# Patient Record
Sex: Female | Born: 1935 | Race: White | Hispanic: No | Smoking: Never smoker
Health system: Southern US, Community
[De-identification: ages and names within clinical notes are randomized; demographics above are authoritative.]

## PROBLEM LIST (undated history)

## (undated) DIAGNOSIS — I10 Essential (primary) hypertension: Secondary | ICD-10-CM

## (undated) DIAGNOSIS — M1990 Unspecified osteoarthritis, unspecified site: Secondary | ICD-10-CM

## (undated) DIAGNOSIS — M069 Rheumatoid arthritis, unspecified: Secondary | ICD-10-CM

## (undated) DIAGNOSIS — M797 Fibromyalgia: Secondary | ICD-10-CM

## (undated) DIAGNOSIS — G4486 Cervicogenic headache: Secondary | ICD-10-CM

## (undated) DIAGNOSIS — R51 Headache: Secondary | ICD-10-CM

## (undated) DIAGNOSIS — R269 Unspecified abnormalities of gait and mobility: Secondary | ICD-10-CM

## (undated) DIAGNOSIS — M47816 Spondylosis without myelopathy or radiculopathy, lumbar region: Secondary | ICD-10-CM

## (undated) DIAGNOSIS — K219 Gastro-esophageal reflux disease without esophagitis: Secondary | ICD-10-CM

## (undated) DIAGNOSIS — IMO0002 Reserved for concepts with insufficient information to code with codable children: Secondary | ICD-10-CM

## (undated) DIAGNOSIS — F32A Depression, unspecified: Secondary | ICD-10-CM

## (undated) DIAGNOSIS — G250 Essential tremor: Secondary | ICD-10-CM

## (undated) DIAGNOSIS — G43909 Migraine, unspecified, not intractable, without status migrainosus: Secondary | ICD-10-CM

## (undated) DIAGNOSIS — H9190 Unspecified hearing loss, unspecified ear: Secondary | ICD-10-CM

## (undated) DIAGNOSIS — N189 Chronic kidney disease, unspecified: Secondary | ICD-10-CM

## (undated) DIAGNOSIS — M8580 Other specified disorders of bone density and structure, unspecified site: Secondary | ICD-10-CM

## (undated) DIAGNOSIS — F329 Major depressive disorder, single episode, unspecified: Secondary | ICD-10-CM

## (undated) DIAGNOSIS — M47812 Spondylosis without myelopathy or radiculopathy, cervical region: Secondary | ICD-10-CM

## (undated) DIAGNOSIS — R011 Cardiac murmur, unspecified: Secondary | ICD-10-CM

## (undated) DIAGNOSIS — E669 Obesity, unspecified: Secondary | ICD-10-CM

## (undated) DIAGNOSIS — I509 Heart failure, unspecified: Secondary | ICD-10-CM

## (undated) HISTORY — DX: Migraine, unspecified, not intractable, without status migrainosus: G43.909

## (undated) HISTORY — PX: TONSILLECTOMY: SUR1361

## (undated) HISTORY — DX: Fibromyalgia: M79.7

## (undated) HISTORY — PX: CHOLECYSTECTOMY: SHX55

## (undated) HISTORY — DX: Obesity, unspecified: E66.9

## (undated) HISTORY — DX: Essential tremor: G25.0

## (undated) HISTORY — DX: Cervicogenic headache: G44.86

## (undated) HISTORY — PX: COLONOSCOPY: SHX174

## (undated) HISTORY — PX: APPENDECTOMY: SHX54

## (undated) HISTORY — PX: HAMMER TOE SURGERY: SHX385

## (undated) HISTORY — DX: Rheumatoid arthritis, unspecified: M06.9

## (undated) HISTORY — DX: Chronic kidney disease, unspecified: N18.9

## (undated) HISTORY — DX: Spondylosis without myelopathy or radiculopathy, lumbar region: M47.816

## (undated) HISTORY — DX: Unspecified abnormalities of gait and mobility: R26.9

## (undated) HISTORY — PX: ABDOMINAL HYSTERECTOMY: SHX81

## (undated) HISTORY — DX: Headache: R51

## (undated) HISTORY — DX: Gastro-esophageal reflux disease without esophagitis: K21.9

## (undated) HISTORY — DX: Spondylosis without myelopathy or radiculopathy, cervical region: M47.812

## (undated) HISTORY — DX: Other specified disorders of bone density and structure, unspecified site: M85.80

## (undated) HISTORY — PX: ELBOW SURGERY: SHX618

## (undated) HISTORY — PX: TOTAL KNEE ARTHROPLASTY: SHX125

---

## 1998-06-21 ENCOUNTER — Ambulatory Visit (HOSPITAL_COMMUNITY): Admission: RE | Admit: 1998-06-21 | Discharge: 1998-06-21 | Payer: Self-pay | Admitting: Internal Medicine

## 1998-10-10 ENCOUNTER — Ambulatory Visit (HOSPITAL_COMMUNITY): Admission: RE | Admit: 1998-10-10 | Discharge: 1998-10-10 | Payer: Self-pay | Admitting: Orthopedic Surgery

## 1998-10-10 ENCOUNTER — Encounter: Payer: Self-pay | Admitting: Orthopedic Surgery

## 1998-10-25 ENCOUNTER — Ambulatory Visit (HOSPITAL_COMMUNITY): Admission: RE | Admit: 1998-10-25 | Discharge: 1998-10-25 | Payer: Self-pay | Admitting: Orthopedic Surgery

## 1998-10-25 ENCOUNTER — Encounter: Payer: Self-pay | Admitting: Orthopedic Surgery

## 1998-11-08 ENCOUNTER — Encounter: Payer: Self-pay | Admitting: Orthopedic Surgery

## 1998-11-08 ENCOUNTER — Ambulatory Visit (HOSPITAL_COMMUNITY): Admission: RE | Admit: 1998-11-08 | Discharge: 1998-11-08 | Payer: Self-pay | Admitting: Orthopedic Surgery

## 2000-03-22 ENCOUNTER — Encounter: Payer: Self-pay | Admitting: Neurosurgery

## 2000-03-22 ENCOUNTER — Ambulatory Visit (HOSPITAL_COMMUNITY): Admission: RE | Admit: 2000-03-22 | Discharge: 2000-03-22 | Payer: Self-pay | Admitting: Neurosurgery

## 2000-04-05 ENCOUNTER — Ambulatory Visit (HOSPITAL_COMMUNITY): Admission: RE | Admit: 2000-04-05 | Discharge: 2000-04-05 | Payer: Self-pay | Admitting: Neurosurgery

## 2000-04-05 ENCOUNTER — Encounter: Payer: Self-pay | Admitting: Neurosurgery

## 2000-04-19 ENCOUNTER — Encounter: Payer: Self-pay | Admitting: Neurosurgery

## 2000-04-19 ENCOUNTER — Ambulatory Visit (HOSPITAL_COMMUNITY): Admission: RE | Admit: 2000-04-19 | Discharge: 2000-04-19 | Payer: Self-pay | Admitting: Neurosurgery

## 2000-06-06 ENCOUNTER — Ambulatory Visit (HOSPITAL_BASED_OUTPATIENT_CLINIC_OR_DEPARTMENT_OTHER): Admission: RE | Admit: 2000-06-06 | Discharge: 2000-06-06 | Payer: Self-pay | Admitting: Orthopedic Surgery

## 2000-08-07 ENCOUNTER — Ambulatory Visit (HOSPITAL_COMMUNITY): Admission: RE | Admit: 2000-08-07 | Discharge: 2000-08-07 | Payer: Self-pay | Admitting: Internal Medicine

## 2000-11-04 ENCOUNTER — Encounter: Payer: Self-pay | Admitting: Orthopedic Surgery

## 2000-11-04 ENCOUNTER — Encounter: Admission: RE | Admit: 2000-11-04 | Discharge: 2000-11-04 | Payer: Self-pay | Admitting: Orthopedic Surgery

## 2000-11-12 ENCOUNTER — Encounter: Admission: RE | Admit: 2000-11-12 | Discharge: 2001-02-10 | Payer: Self-pay | Admitting: Orthopedic Surgery

## 2000-11-29 ENCOUNTER — Encounter: Admission: RE | Admit: 2000-11-29 | Discharge: 2000-12-27 | Payer: Self-pay | Admitting: Orthopedic Surgery

## 2001-02-21 ENCOUNTER — Encounter: Admission: RE | Admit: 2001-02-21 | Discharge: 2001-04-16 | Payer: Self-pay | Admitting: Family Medicine

## 2001-02-21 ENCOUNTER — Ambulatory Visit (HOSPITAL_COMMUNITY): Admission: RE | Admit: 2001-02-21 | Discharge: 2001-02-21 | Payer: Self-pay | Admitting: Family Medicine

## 2001-02-21 ENCOUNTER — Encounter: Payer: Self-pay | Admitting: Family Medicine

## 2001-04-18 ENCOUNTER — Encounter: Admission: RE | Admit: 2001-04-18 | Discharge: 2001-04-18 | Payer: Self-pay | Admitting: Family Medicine

## 2001-04-18 ENCOUNTER — Encounter: Payer: Self-pay | Admitting: Family Medicine

## 2001-04-30 ENCOUNTER — Encounter: Admission: RE | Admit: 2001-04-30 | Discharge: 2001-04-30 | Payer: Self-pay | Admitting: Family Medicine

## 2001-04-30 ENCOUNTER — Encounter: Payer: Self-pay | Admitting: Family Medicine

## 2001-05-15 ENCOUNTER — Encounter: Admission: RE | Admit: 2001-05-15 | Discharge: 2001-05-15 | Payer: Self-pay | Admitting: Family Medicine

## 2001-05-15 ENCOUNTER — Encounter: Payer: Self-pay | Admitting: Family Medicine

## 2002-03-16 ENCOUNTER — Encounter: Admission: RE | Admit: 2002-03-16 | Discharge: 2002-05-06 | Payer: Self-pay | Admitting: Family Medicine

## 2002-08-03 ENCOUNTER — Encounter: Admission: RE | Admit: 2002-08-03 | Discharge: 2002-09-29 | Payer: Self-pay | Admitting: Internal Medicine

## 2003-03-26 ENCOUNTER — Encounter: Payer: Self-pay | Admitting: Internal Medicine

## 2003-03-26 ENCOUNTER — Encounter: Admission: RE | Admit: 2003-03-26 | Discharge: 2003-03-26 | Payer: Self-pay | Admitting: Internal Medicine

## 2004-01-10 ENCOUNTER — Encounter: Admission: RE | Admit: 2004-01-10 | Discharge: 2004-03-28 | Payer: Self-pay | Admitting: Family Medicine

## 2004-08-28 ENCOUNTER — Encounter: Admission: RE | Admit: 2004-08-28 | Discharge: 2004-08-28 | Payer: Self-pay | Admitting: Family Medicine

## 2005-08-22 ENCOUNTER — Encounter: Admission: RE | Admit: 2005-08-22 | Discharge: 2005-09-25 | Payer: Self-pay | Admitting: Family Medicine

## 2005-09-26 ENCOUNTER — Encounter: Admission: RE | Admit: 2005-09-26 | Discharge: 2005-10-23 | Payer: Self-pay | Admitting: Family Medicine

## 2005-10-24 ENCOUNTER — Encounter: Admission: RE | Admit: 2005-10-24 | Discharge: 2005-11-09 | Payer: Self-pay | Admitting: Family Medicine

## 2005-12-11 ENCOUNTER — Encounter: Admission: RE | Admit: 2005-12-11 | Discharge: 2006-03-11 | Payer: Self-pay | Admitting: Family Medicine

## 2005-12-29 ENCOUNTER — Emergency Department (HOSPITAL_COMMUNITY): Admission: EM | Admit: 2005-12-29 | Discharge: 2005-12-29 | Payer: Self-pay | Admitting: Emergency Medicine

## 2006-03-13 ENCOUNTER — Encounter: Admission: RE | Admit: 2006-03-13 | Discharge: 2006-03-29 | Payer: Self-pay | Admitting: Family Medicine

## 2006-05-06 ENCOUNTER — Encounter: Admission: RE | Admit: 2006-05-06 | Discharge: 2006-05-06 | Payer: Self-pay | Admitting: Family Medicine

## 2006-07-25 ENCOUNTER — Encounter: Admission: RE | Admit: 2006-07-25 | Discharge: 2006-07-25 | Payer: Self-pay | Admitting: Family Medicine

## 2006-07-29 ENCOUNTER — Encounter: Admission: RE | Admit: 2006-07-29 | Discharge: 2006-07-29 | Payer: Self-pay | Admitting: Family Medicine

## 2007-01-15 ENCOUNTER — Encounter: Admission: RE | Admit: 2007-01-15 | Discharge: 2007-02-05 | Payer: Self-pay | Admitting: Family Medicine

## 2007-01-28 ENCOUNTER — Encounter: Admission: RE | Admit: 2007-01-28 | Discharge: 2007-01-28 | Payer: Self-pay | Admitting: Family Medicine

## 2007-02-19 ENCOUNTER — Encounter: Admission: RE | Admit: 2007-02-19 | Discharge: 2007-05-20 | Payer: Self-pay | Admitting: Family Medicine

## 2007-02-25 ENCOUNTER — Encounter: Admission: RE | Admit: 2007-02-25 | Discharge: 2007-02-25 | Payer: Self-pay | Admitting: Cardiology

## 2007-02-27 ENCOUNTER — Inpatient Hospital Stay (HOSPITAL_BASED_OUTPATIENT_CLINIC_OR_DEPARTMENT_OTHER): Admission: RE | Admit: 2007-02-27 | Discharge: 2007-02-27 | Payer: Self-pay | Admitting: Cardiology

## 2007-03-17 ENCOUNTER — Inpatient Hospital Stay (HOSPITAL_COMMUNITY): Admission: RE | Admit: 2007-03-17 | Discharge: 2007-03-21 | Payer: Self-pay | Admitting: Orthopedic Surgery

## 2007-03-21 ENCOUNTER — Ambulatory Visit: Payer: Self-pay | Admitting: Vascular Surgery

## 2007-04-21 ENCOUNTER — Encounter: Admission: RE | Admit: 2007-04-21 | Discharge: 2007-07-20 | Payer: Self-pay | Admitting: Orthopedic Surgery

## 2007-07-29 ENCOUNTER — Encounter: Admission: RE | Admit: 2007-07-29 | Discharge: 2007-08-02 | Payer: Self-pay | Admitting: Orthopedic Surgery

## 2008-09-06 ENCOUNTER — Encounter: Admission: RE | Admit: 2008-09-06 | Discharge: 2008-10-25 | Payer: Self-pay | Admitting: Podiatrist

## 2008-10-05 ENCOUNTER — Encounter: Admission: RE | Admit: 2008-10-05 | Discharge: 2008-10-05 | Payer: Self-pay | Admitting: Internal Medicine

## 2008-11-08 ENCOUNTER — Encounter: Admission: RE | Admit: 2008-11-08 | Discharge: 2009-01-07 | Payer: Self-pay | Admitting: Podiatrist

## 2009-03-01 ENCOUNTER — Encounter: Admission: RE | Admit: 2009-03-01 | Discharge: 2009-04-06 | Payer: Self-pay | Admitting: Family Medicine

## 2009-10-04 ENCOUNTER — Encounter: Admission: RE | Admit: 2009-10-04 | Discharge: 2009-10-04 | Payer: Self-pay | Admitting: Family Medicine

## 2009-10-11 ENCOUNTER — Encounter: Admission: RE | Admit: 2009-10-11 | Discharge: 2009-10-11 | Payer: Self-pay | Admitting: Family Medicine

## 2010-05-30 ENCOUNTER — Encounter
Admission: RE | Admit: 2010-05-30 | Discharge: 2010-07-13 | Payer: Self-pay | Source: Home / Self Care | Admitting: Family Medicine

## 2010-11-18 ENCOUNTER — Emergency Department (HOSPITAL_COMMUNITY)
Admission: EM | Admit: 2010-11-18 | Discharge: 2010-11-18 | Disposition: A | Discharge status: Other Acute Inpatient Hospital | Payer: Self-pay | Source: Home / Self Care | Admitting: Family Medicine

## 2010-11-18 ENCOUNTER — Emergency Department (HOSPITAL_COMMUNITY)
Admission: EM | Admit: 2010-11-18 | Discharge: 2010-11-19 | Payer: Self-pay | Source: Home / Self Care | Admitting: Emergency Medicine

## 2010-11-21 LAB — APTT: aPTT: 35 seconds (ref 24–37)

## 2010-11-21 LAB — CBC
Hemoglobin: 11.1 g/dL — ABNORMAL LOW (ref 12.0–15.0)
MCH: 27.1 pg (ref 26.0–34.0)
MCV: 88 fL (ref 78.0–100.0)
Platelets: 306 10*3/uL (ref 150–400)
RDW: 14.1 % (ref 11.5–15.5)
WBC: 9 10*3/uL (ref 4.0–10.5)

## 2010-11-21 LAB — URINALYSIS, ROUTINE W REFLEX MICROSCOPIC
Bilirubin Urine: NEGATIVE
Ketones, ur: NEGATIVE mg/dL
Nitrite: NEGATIVE
Protein, ur: NEGATIVE mg/dL
Specific Gravity, Urine: 1.009 (ref 1.005–1.030)
Urobilinogen, UA: 0.2 mg/dL (ref 0.0–1.0)
pH: 7 (ref 5.0–8.0)

## 2010-11-21 LAB — PROTIME-INR
INR: 0.93 (ref 0.00–1.49)
Prothrombin Time: 12.7 seconds (ref 11.6–15.2)

## 2010-11-21 LAB — COMPREHENSIVE METABOLIC PANEL
BUN: 22 mg/dL (ref 6–23)
Chloride: 103 mEq/L (ref 96–112)
Creatinine, Ser: 1.04 mg/dL (ref 0.4–1.2)
GFR calc Af Amer: >60 mL/min (ref >60)
Potassium: 4.4 mEq/L (ref 3.5–5.1)
Sodium: 140 mEq/L (ref 135–145)
Total Bilirubin: 0.8 mg/dL (ref 0.3–1.2)
Total Protein: 7.1 g/dL (ref 6.0–8.3)

## 2010-11-21 LAB — DIFFERENTIAL
Monocytes Absolute: 0.8 10*3/uL (ref 0.1–1.0)
Monocytes Relative: 8 % (ref 3–12)
Neutrophils Relative %: 63 % (ref 43–77)

## 2010-11-21 LAB — URINE CULTURE: Culture  Setup Time: 201201221147

## 2010-11-29 ENCOUNTER — Encounter: Payer: Self-pay | Admitting: Orthopaedic Surgery

## 2011-03-16 NOTE — Cardiovascular Report (Signed)
NAME:  Dominique Mckee, Dominique Mckee                ACCOUNT NO.:  [id number]   MEDICAL RECORD NO.:  [id number]          PATIENT TYPE:  OIB   LOCATION:  1961                         FACILITY:  [facility]   PHYSICIAN:  Georga Hacking, M.D.DATE OF BIRTH:  Nov 06, 1929   DATE OF PROCEDURE:  02/27/2007  DATE OF DISCHARGE:                            CARDIAC CATHETERIZATION   HISTORY:  A 77 year old female with hypertension who has had some  dyspnea and has had a preoperative cardiac evaluation with a Cardiolite  test.  She had significant ST depression with adenosine and a reversible  distal anterior defect.  Catheterization was advised to assess her  cardiac risk.   PROCEDURE:  Left heart catheterization with coronary angiograms and left  ventriculogram.   COMMENTS ABOUT THE PROCEDURE:  The procedure was done in the outpatient  diagnostic lab through the right femoral artery using a single anterior  wall stick using a 4-French sheath and catheters.  At the end of the  procedure, good hemostasis and peripheral pulses were noted.   HEMODYNAMIC DATA:  Aorta post contrast 145/60, LV postcontrast 145/6-10.   ANGIOGRAPHIC DATA:  Left ventriculogram:  Performed in the 30 degrees  RAO projection.  The aortic valve is normal.  There is mitral annular  calcification noted.  No mitral regurgitation noted.  The left ventricle  is normal in size with normal wall motion.  Estimated ejection fraction  was 70%.  Coronary arteries arise and distribute normally.  There was no  significant coronary calcification present.  The left main coronary  artery was normal.  The left anterior descending is tortuous but  contains no significant disease.  A moderate-sized diagonal branch  arises.  The circumflex coronary artery is tortuous and contains a  single branching marginal artery.  The right coronary artery is a  dominant vessel with a shepherd's crook configuration.  The distal  vessel is somewhat small.  Posterior descending  is good size.   IMPRESSION:  1. Normal left ventricular function.  2. No significant obstructive coronary artery disease identified.   RECOMMENDATIONS:  Proceed with surgery.      Georga Hacking, M.D.     WST/MEDQ  D:  02/27/2007  T:  02/27/2007  Job:  [id number]   cc:   Lillia Carmel, M.D.  Loreta Ave, M.D.

## 2011-03-16 NOTE — Discharge Summary (Signed)
NAME:  Dominique Mckee, Dominique Mckee                ACCOUNT NO.:  [id number]   MEDICAL RECORD NO.:  [id number]          PATIENT TYPE:  INP   LOCATION:  5005                         FACILITY:  [facility]   PHYSICIAN:  Loreta Ave, M.D. DATE OF BIRTH:  [birth year]   DATE OF ADMISSION:  03/17/2007  DATE OF DISCHARGE:  03/21/2007                               DISCHARGE SUMMARY   FINAL DIAGNOSES:  1. Status post right total knee replacement for endstage degenerative      joint disease.  2. Anemia secondary to postoperative blood loss.  3. Hypertension.  4. Coronary artery disease.  5. Gastroesophageal reflux disease.  6. Cervical degenerative disk disease.  7. Osteoarthrosis.   HISTORY OF PRESENT ILLNESS:  A 77 year old white female presented to our  office with complaints of endstage DJD right knee and chronic pain.  She  had progressively worsening pain with failed response with conservative  treatment.  Significant decrease in her daily activities due to the  ongoing complaint.   HOSPITAL COURSE:  On Mar 17, 2007 the patient was taken to the [facility] operating room and a right total knee replacement procedure  performed.  Surgeon Mckinley Jewel, M.D. and assistant Genene Churn. Barry Dienes, PA-  C.  Anesthesia general with femoral nerve block.  No specimens.  EBL  minimal.  Tourniquet time 1 hour and 20 minutes.  One Hemovac drain  placed.  The patient tolerated the procedure well and was transferred to  recovery in stable condition.  On Mar 18, 2007 patient doing well.  Good  pain control.  Vital signs stable.  Afebrile.  Hemoglobin 8.9,  hematocrit 27.5, sodium 138, potassium 4.1, chloride 103, CO2 29, BUN  11, creatinine 0.95, glucose 122, INR 1.2.  Dressing clean, dry and  intact.  Calf nontender.  Neurovascularly intact.  Started FeSO4 325 mg  p.o. b.i.d. with meals.  Started PT.  Coumadin pharmacy protocol  started.  PT recommended skilled nursing facility placement.  On Mar 19, 2007 patient with  good pain control.  States she was feeling a little  fatigued and weak.  No complaints of chest pain or shortness of breath.  Temperature 99.3, pulse 83, respirations 20, blood pressure 125/65.  WBC  8.3, hemoglobin 8.1, hematocrit 24.5, platelets 206.  Sodium 140,  potassium 4.3, chloride 105, CO2 31, BUN 7, creatinine 0.75, glucose  131, INR 1.4.  Preoperative hemoglobin was 12.1.  Wound looked good.  Staples intact.  Discontinued Hemovac drain.  No signs of infection.  Calf nontender.  Neurovascularly intact.  Transfused two units packed  red blood cells and then discontinued Foley.  Discontinued PCA.  On Mar 20, 2007 patient feeling much better after transfusion packed red blood  cells.  Good pain control.  Vital signs stable.  Afebrile.  Hemoglobin  9.9, hemoglobin 29.7.  Electrolytes stable.  INR 1.8.  Wound looked  good.  Staples intact.  No drainage of signs of infection.  Calf  nontender.  Neurovascularly intact.  On Mar 21, 2007 patient doing well,  but complaining of right greater than left calf pain.  Denies chest pain  or shortness of breath.  States she is ready to transfer to a skilled  facility and bed available at [facility].  Temperature 97.8,  pulse 71, respirations 18, blood pressure 130/66.  WBC 7.3, hematocrit  30.7, hemoglobin 10.0, platelets 248.  Sodium 142, potassium 3.9,  chloride 103, CO2 29, BUN 17, creatinine 1.02, glucose 102.  INR 2.0.  Wound looked good.  Staples intact.  No drainage of signs of infection.  She does have right greater than left proximal to mid calf tenderness.  Negative Homans bilaterally.  Neurovascularly intact.  Will obtain  venous Doppler bilateral lower extremities this a.m. to rule out DVT.  She is currently on Coumadin for DVT prophylaxis and INR is now  therapeutic.  Anticipate transfer to skilled facility today.  We will be  called with results of the Doppler test.   CONDITION ON DISCHARGE:  Stable.   DISPOSITION:   Transfer to [facility] skilled facility.   DISCHARGE MEDICATIONS:  1. Colace 100 mg p.o. b.i.d.  2. Coumadin pharmacy protocol maintain INR 2-3.  3. Lisinopril 20 mg p.o. daily.  4. Protonix 40 mg p.o. daily.  5. Elavil 25 mg p.o. q.h.s. p.r.n.  6. Calcium carbonate and vitamin D 2 tablets p.o. daily.  7. FeSO4 325 mg 1 tablet p.o. b.i.d. with food.  8. Percocet 5/325 1-2 tablets p.o. q.4-6h. p.r.n. for pain.  9. Tylenol 325 to 650 mg p.o. q.4-6h. p.r.n. for temperature greater      than 101.  10.Robaxin 500 mg 1 tablet p.o. q.6h. p.r.n. for spasm.   DISPOSITION:  The patient will work with PT and OT to improve knee range  of motion, strengthening and better ambulation.  Daily dressing changes  with 4x4, gauze and tape.  The patient is to sleep with her knee  immobilizer on.  Also to use immobilizer when she is up and ambulating.  Coumadin x4 weeks postop for DVT prophylaxis.  She is okay to shower,  but no tub soaking.  No driving.  Follow up in the office two weeks from  her date of surgery for recheck.  Call for appointment at [phone].  If  there are any questions or concerns, please notify us.      Genene Churn. Denton Meek.      Loreta Ave, M.D.  Electronically Signed    JMO/MEDQ  D:  03/21/2007  T:  03/21/2007  Job:  [id number]

## 2011-03-16 NOTE — Op Note (Signed)
NAME:  Dominique Mckee, Dominique Mckee                ACCOUNT NO.:  [id number]   MEDICAL RECORD NO.:  [id number]          PATIENT TYPE:  EMS   LOCATION:  ED                           FACILITY:  [facility]   PHYSICIAN:  Etter Sjogren, M.D.     DATE OF BIRTH:  1930-09-27   DATE OF PROCEDURE:  12/29/2005  DATE OF DISCHARGE:  12/29/2005                                 OPERATIVE REPORT   PREOPERATIVE DIAGNOSES:  1.  Complex lip lacerations, upper and lower lip, 5 cm.  2.  Simple nasal laceration, 2 cm.   POSTOPERATIVE DIAGNOSES:  1.  Complex lip lacerations, upper and lower lip, 5 cm.  2.  Simple nasal laceration, 2 cm.   PROCEDURE:  1.  Complex repair, total length of 5 cm, of lip lacerations.  2.  Simple repair of nasal laceration, 2 cm.   SURGEON:  Etter Sjogren, M.D.   ANESTHESIA:  Xylocaine 1% with epinephrine plus bicarb.   CLINICAL NOTE:  This is a 77 year old woman fell a couple of hours ago in  her garden and suffered significant lacerations to her face.  She is  uncertain as to when she has had her last tetanus shot.  She has had x-rays  in the emergency department; there is no evidence of any maxillary or  mandibular fracture.  She does have a tooth that was dislodged and  apparently a dentist is coming in tonight to replace that tooth; it is in a  tooth-saver at the present time.  We discussed the procedure, scarring and  she wishes to proceed with repair.   DESCRIPTION OF PROCEDURE:  The patient was placed in a head-elevated  position.  Successful local anesthesia was achieved, prepped with Betadine  and draped with sterile drapes, irrigated with copious amounts of saline.  A  layered closure of 4-0 chromic interrupted and inverted deep sutures for the  muscle, 4-0 chromic interrupted and inverted subcutaneous sutures, 6-0  Prolene simple sutures lining up the vermilion border and then a simple  running suture and interrupted sutures as needed.  Nose closed with simple 6-  0 Prolene running  sutures.  Lower lip laceration was with vermilion and  closed with 4-0 chromic simple interrupted sutures.  Antibiotic ointment was  applied and tolerated well.   DISPOSITION:  Recheck in the office next week.      Etter Sjogren, M.D.  Electronically Signed    DB/MEDQ  D:  12/29/2005  T:  12/31/2005  Job:  [id number]

## 2011-03-16 NOTE — Op Note (Signed)
[facility]  Patient:    LEEAN, AMEZCUA                       MRN: [id number] Proc. Date: 06/06/00 Adm. Date:  [id number] Attending:  Colbert Ewing                           Operative Report  PREOPERATIVE DIAGNOSIS:  Symptomatic chronic ingrown toenail, left great toe.  POSTOPERATIVE DIAGNOSIS:  Symptomatic chronic ingrown toenail, left great toe, with partial attritional tearing extensor hallucis longus tendon to base of proximal phalanx.  PROCEDURE: 1. Complete excision of great toenail, nail bed and matrix. 2. Repair of partial extensor hallucis longus tear and primary closure with    partial resection, distal phalanx, Syme type amputation.  ASSISTANT:  Oris Drone. Petrarca, P.A.-C.  ANESTHESIA:  Ankle block.  SPECIMENS:  None.  CULTURES:  None.  COMPLICATIONS:  None.  DRESSING:  Soft compressive with wooden shoe and plantar splint.  TOURNIQUET TIME:  30 min with an Esmarch at the ankle.  DESCRIPTION OF PROCEDURE:  The patient was brought to operating room, placed on the operating room table in the supine position.  After adequate anesthesia, the left foot was prepped and draped in the usual sterile fashion. An Esmarch used as a tourniquet after the foot was wrapped out, an Esmarch placed at the ankle.  The chronic deformed nail, great toe, was excised in its entirety, along with the nail bed and matrix.  After the defect was exposed and the base of the proximal phalanx explored, there was an obvious defect in the extensor tendon over 75% of the tendon.  Through the bed, the distal phalanx was shortened about half its distance to allow for a primary closure. The extensor tendon was well captured with 3-0 Mersilene suture and then repaired back down to the distal phalanx through drill holes over-tied over bone.  After the distal phalanx had been shortened, the wound was irrigated. The plantar flap was then brought dorsal to  close the defect from the excision of the nail and nail bed.  A primary closure with nylon.  Sterile compressive dressing applied with a splint on the plantar aspect of the great toe to protect the extensor hallucis longus repair.  After the dressing was in place, the Esmarch was removed, the anesthesia reversed and the patient was brought to the recovery room.  The patient tolerated the surgery well, no complications. DD:  06/06/00 TD:  06/06/00 Job: [id number]

## 2011-04-10 ENCOUNTER — Ambulatory Visit: Payer: Medicare Other | Attending: Rehabilitation | Admitting: Physical Therapy

## 2011-04-10 DIAGNOSIS — M545 Low back pain, unspecified: Secondary | ICD-10-CM | POA: Insufficient documentation

## 2011-04-10 DIAGNOSIS — M2569 Stiffness of other specified joint, not elsewhere classified: Secondary | ICD-10-CM | POA: Insufficient documentation

## 2011-04-10 DIAGNOSIS — IMO0001 Reserved for inherently not codable concepts without codable children: Secondary | ICD-10-CM | POA: Insufficient documentation

## 2011-04-10 DIAGNOSIS — M25559 Pain in unspecified hip: Secondary | ICD-10-CM | POA: Insufficient documentation

## 2011-04-12 ENCOUNTER — Ambulatory Visit: Payer: Medicare Other | Admitting: Rehabilitation

## 2011-04-17 ENCOUNTER — Ambulatory Visit: Payer: Medicare Other | Admitting: Physical Therapy

## 2011-04-19 ENCOUNTER — Encounter: Payer: Medicare Other | Admitting: Physical Therapy

## 2011-04-24 ENCOUNTER — Encounter: Payer: Medicare Other | Admitting: Physical Therapy

## 2011-04-26 ENCOUNTER — Ambulatory Visit: Payer: Medicare Other | Admitting: Physical Therapy

## 2011-05-01 ENCOUNTER — Ambulatory Visit: Payer: Medicare Other | Attending: Rehabilitation | Admitting: Physical Therapy

## 2011-05-01 DIAGNOSIS — M25559 Pain in unspecified hip: Secondary | ICD-10-CM | POA: Insufficient documentation

## 2011-05-01 DIAGNOSIS — M545 Low back pain, unspecified: Secondary | ICD-10-CM | POA: Insufficient documentation

## 2011-05-01 DIAGNOSIS — M2569 Stiffness of other specified joint, not elsewhere classified: Secondary | ICD-10-CM | POA: Insufficient documentation

## 2011-05-01 DIAGNOSIS — IMO0001 Reserved for inherently not codable concepts without codable children: Secondary | ICD-10-CM | POA: Insufficient documentation

## 2011-05-07 ENCOUNTER — Ambulatory Visit: Payer: Medicare Other | Admitting: Physical Therapy

## 2011-05-10 ENCOUNTER — Ambulatory Visit: Payer: Medicare Other | Admitting: Physical Therapy

## 2011-05-14 ENCOUNTER — Ambulatory Visit: Payer: Medicare Other | Admitting: Rehabilitation

## 2011-05-17 ENCOUNTER — Encounter: Payer: Medicare Other | Admitting: Rehabilitation

## 2011-05-29 ENCOUNTER — Observation Stay (HOSPITAL_COMMUNITY)
Admission: EM | Admit: 2011-05-29 | Discharge: 2011-06-01 | Disposition: A | Discharge status: Home / Self Care | Payer: Medicare Other | Attending: Internal Medicine | Admitting: Internal Medicine

## 2011-05-29 ENCOUNTER — Emergency Department (HOSPITAL_COMMUNITY): Payer: Medicare Other

## 2011-05-29 DIAGNOSIS — R279 Unspecified lack of coordination: Secondary | ICD-10-CM | POA: Insufficient documentation

## 2011-05-29 DIAGNOSIS — IMO0001 Reserved for inherently not codable concepts without codable children: Secondary | ICD-10-CM | POA: Insufficient documentation

## 2011-05-29 DIAGNOSIS — I6990 Unspecified sequelae of unspecified cerebrovascular disease: Secondary | ICD-10-CM | POA: Insufficient documentation

## 2011-05-29 DIAGNOSIS — R0789 Other chest pain: Secondary | ICD-10-CM | POA: Insufficient documentation

## 2011-05-29 DIAGNOSIS — I672 Cerebral atherosclerosis: Secondary | ICD-10-CM | POA: Insufficient documentation

## 2011-05-29 DIAGNOSIS — M5030 Other cervical disc degeneration, unspecified cervical region: Secondary | ICD-10-CM | POA: Insufficient documentation

## 2011-05-29 DIAGNOSIS — I6529 Occlusion and stenosis of unspecified carotid artery: Secondary | ICD-10-CM | POA: Insufficient documentation

## 2011-05-29 DIAGNOSIS — M948X9 Other specified disorders of cartilage, unspecified sites: Secondary | ICD-10-CM | POA: Insufficient documentation

## 2011-05-29 DIAGNOSIS — R112 Nausea with vomiting, unspecified: Secondary | ICD-10-CM | POA: Insufficient documentation

## 2011-05-29 DIAGNOSIS — I10 Essential (primary) hypertension: Secondary | ICD-10-CM | POA: Insufficient documentation

## 2011-05-29 DIAGNOSIS — R42 Dizziness and giddiness: Principal | ICD-10-CM | POA: Insufficient documentation

## 2011-05-29 DIAGNOSIS — M069 Rheumatoid arthritis, unspecified: Secondary | ICD-10-CM | POA: Insufficient documentation

## 2011-05-29 DIAGNOSIS — I658 Occlusion and stenosis of other precerebral arteries: Secondary | ICD-10-CM | POA: Insufficient documentation

## 2011-05-29 DIAGNOSIS — K219 Gastro-esophageal reflux disease without esophagitis: Secondary | ICD-10-CM | POA: Insufficient documentation

## 2011-05-29 DIAGNOSIS — Z79899 Other long term (current) drug therapy: Secondary | ICD-10-CM | POA: Insufficient documentation

## 2011-05-29 LAB — POCT I-STAT, CHEM 8
Calcium, Ion: 1.19 mmol/L (ref 1.12–1.32)
Chloride: 105 mEq/L (ref 96–112)
Glucose, Bld: 139 mg/dL — ABNORMAL HIGH (ref 70–99)
Hemoglobin: 12.2 g/dL (ref 12.0–15.0)
Potassium: 4.4 mEq/L (ref 3.5–5.1)
Sodium: 140 mEq/L (ref 135–145)
TCO2: 26 mmol/L (ref 0–100)

## 2011-05-30 ENCOUNTER — Observation Stay (HOSPITAL_COMMUNITY): Payer: Medicare Other

## 2011-05-30 DIAGNOSIS — R42 Dizziness and giddiness: Secondary | ICD-10-CM

## 2011-05-30 LAB — COMPREHENSIVE METABOLIC PANEL
ALT: 7 U/L (ref 0–35)
AST: 13 U/L (ref 0–37)
AST: 13 U/L (ref 0–37)
Albumin: 3.3 g/dL — ABNORMAL LOW (ref 3.5–5.2)
BUN: 15 mg/dL (ref 6–23)
CO2: 29 mEq/L (ref 19–32)
Chloride: 103 mEq/L (ref 96–112)
Chloride: 103 mEq/L (ref 96–112)
Creatinine, Ser: 0.73 mg/dL (ref 0.50–1.10)
Creatinine, Ser: 0.73 mg/dL (ref 0.50–1.10)
GFR calc Af Amer: >60 mL/min (ref >60)
GFR calc non Af Amer: >60 mL/min (ref >60)
Glucose, Bld: 128 mg/dL — ABNORMAL HIGH (ref 70–99)
Total Bilirubin: 0.3 mg/dL (ref 0.3–1.2)
Total Bilirubin: 0.3 mg/dL (ref 0.3–1.2)
Total Protein: 6.8 g/dL (ref 6.0–8.3)

## 2011-05-30 LAB — DIFFERENTIAL
Basophils Absolute: 0 10*3/uL (ref 0.0–0.1)
Basophils Relative: 0 % (ref 0–1)
Eosinophils Absolute: 0 10*3/uL (ref 0.0–0.7)
Eosinophils Relative: 0 % (ref 0–5)
Lymphs Abs: 1.4 10*3/uL (ref 0.7–4.0)
Neutrophils Relative %: 79 % — ABNORMAL HIGH (ref 43–77)

## 2011-05-30 LAB — URINALYSIS, ROUTINE W REFLEX MICROSCOPIC
Leukocytes, UA: NEGATIVE
Protein, ur: NEGATIVE mg/dL
Specific Gravity, Urine: 1.018 (ref 1.005–1.030)
Urobilinogen, UA: 0.2 mg/dL (ref 0.0–1.0)

## 2011-05-30 LAB — CBC
HCT: 32.3 % — ABNORMAL LOW (ref 36.0–46.0)
MCHC: 31.6 g/dL (ref 30.0–36.0)
MCV: 78.8 fL (ref 78.0–100.0)
MCV: 79.1 fL (ref 78.0–100.0)
Platelets: 306 10*3/uL (ref 150–400)
Platelets: 328 10*3/uL (ref 150–400)
RBC: 4.11 MIL/uL (ref 3.87–5.11)
RDW: 15.5 % (ref 11.5–15.5)
RDW: 15.6 % — ABNORMAL HIGH (ref 11.5–15.5)
WBC: 9.3 10*3/uL (ref 4.0–10.5)
WBC: 9.6 10*3/uL (ref 4.0–10.5)

## 2011-05-30 LAB — TSH: TSH: 0.971 u[IU]/mL (ref 0.350–4.500)

## 2011-05-30 LAB — CARDIAC PANEL(CRET KIN+CKTOT+MB+TROPI)
Relative Index: INVALID (ref 0.0–2.5)
Total CK: 93 U/L (ref 7–177)

## 2011-05-30 LAB — PHOSPHORUS: Phosphorus: 3.2 mg/dL (ref 2.3–4.6)

## 2011-05-30 LAB — LIPID PANEL
Total CHOL/HDL Ratio: 3.4 RATIO
VLDL: 15 mg/dL (ref 0–40)

## 2011-05-30 LAB — CK TOTAL AND CKMB (NOT AT ARMC)
CK, MB: 2.5 ng/mL (ref 0.3–4.0)
Relative Index: INVALID (ref 0.0–2.5)
Total CK: 73 U/L (ref 7–177)

## 2011-05-30 LAB — PROTIME-INR: Prothrombin Time: 13.4 seconds (ref 11.6–15.2)

## 2011-05-30 LAB — TROPONIN I: Troponin I: <0.3 ng/mL (ref <0.30)

## 2011-05-30 LAB — LIPASE, BLOOD: Lipase: 16 U/L (ref 11–59)

## 2011-05-30 LAB — APTT: aPTT: 37 seconds (ref 24–37)

## 2011-05-30 NOTE — H&P (Signed)
NAME:  Dominique Mckee, Dominique Mckee                ACCOUNT NO.:  [id number]  MEDICAL RECORD NO.:  [id number]  LOCATION:  [facility]                         FACILITY:  [facility]  PHYSICIAN:  Michiel Cowboy, MDDATE OF BIRTH:  1930/06/06  DATE OF ADMISSION:  05/29/2011 DATE OF DISCHARGE:                             HISTORY & PHYSICAL   PRIMARY CARE PROVIDER:  Theressa Millard, MD  CHIEF COMPLAINT:  Nausea, vomiting, headache.  HISTORY OF PRESENT ILLNESS:  The patient is a 77 year old female with a past history of arthritis, hypertension as well as spinal stenosis.  She does also have history of rheumatoid arthritis.  She tells me that she has occasional episodes like this which are spells where she develops occasional headache with spinning like sensation versus just feels oozy, nauseous, and vomiting.  Usually she takes antinausea medicine, lays down and rest and that makes it better but since Monday, she had been struggling with this particular episode without much of any improvement. She continued to vomit at which point she presented to emergency department.  In the emergency department, she did receive Zofran and morphine after repeated attempts.  It was very difficult to bring her nausea under control.  She started to feel lightheaded when she sits up and appeared to be dehydrated.  She also endorsed to me that she was having some heavy sensation earlier today in her chest, although no chest pains per se.  No shortness of breath.  No fevers, no chills, no abdominal discomfort, and no diarrhea.  The patient was able to eat and did not vomit her food, but did feel still very nauseous and weak when she was just trying to sit up.  REVIEW OF SYSTEMS:  Otherwise review of systems are negative except for HPI.  Ten systems were reviewed.  PAST MEDICAL HISTORY: 1. Hypertension. 2. GERD. 3. Rheumatoid arthritis. 4. Spinal stenosis. 5. Cervical/vertebral degenerative disk disease.  SOCIAL HISTORY:   The patient does not smoke, drink, or abuse drugs.  FAMILY HISTORY:  Noncontributory.  ALLERGIES:  CLINORIL and FELDENE.  MEDICATIONS:  She thinks she takes: 1. Morphine 5 mg daily. 2. Fenesin DM IR. 3. Fentanyl patch 50 mcg. 4. Floxuridine. 5. Nexium 40 mg daily. 6. Percocet as needed. 7. Benazepril, not sure of the dose.  She thinks she takes it once a     day.  PHYSICAL EXAMINATION:  VITAL SIGNS:  Temperature 98.4, blood pressure 152/103, pulse 83, respirations 16, and saturating 100% on room air. GENERAL:  The patient currently appears to be in no acute distress. HEAD:  Nontraumatic. MOUTH:  Dry mucous membranes. SKIN:  Decreased skin turgor. LUNGS:  Clear to auscultation bilaterally. HEART:  Regular rate and rhythm.  No murmurs appreciated. ABDOMEN:  Soft, nontender, and nondistended. EXTREMITIES:  No clubbing, cyanosis, or edema. NEUROLOGIC:  Strength 5/5 in all 4 extremities.  Cranial II-XII intact. I do not appreciate any nystagmus, positional otherwise.  Even at that time when she tells me that the room was spinning, she feels oozy and there is no nystagmus present.  LABORATORY DATA:  White blood cell count 9.3, hemoglobin 10.2.  Sodium 139, potassium 3.5, creatinine 0.73 with BUN of 15.  CMET  is unremarkable.  Albumin is 3.3 and lipase 16.  CT scan of the head was unremarkable.  EKG showing right bundle-branch block but no change from prior.  KUB is unremarkable.  ASSESSMENT AND PLAN:  This is a 77 year old female with recurrent episodes of nausea, vomiting, headache, and questionable vertigo.  She does appear to be currently dehydrated.  She also was endorsing some chest tightness while showering yesterday. 1. Vertigo, nausea, and vomiting.  It seems somewhat positional, but     given her advanced age and the fact that she was supposed to have     actually MRI workup of her brain to evaluate this further as an     operation, per the patient's report we will  admit for any kind of     central vertigo evaluation.  We will obtain MRI/MRA of her brain     given that she cannot quite say to me if this is presyncope like or     vertigo.  We will also check carotid Dopplers.  We will watch her     on telemetry. 2. Chest pressure.  This is somewhat atypical but given nausea and     vomiting, I guess in advanced age she warrants further workup.  We     will cycle cardiac enzymes, check serial EKG, check 2-D echo, check     fasting lipid panel, and hemoglobin A1c.  Also, make sure she is on     aspirin. 3. History of chronic pain is due to spinal stenosis and rheumatoid     arthritis.  At some point, the patient should be evaluated for     cervical stability given her history of rheumatoid arthritis.     Continue for right now her fentanyl patch. 4. Hypertension.  Continue Norvasc and we will attempt to restart her     benazepril once we know the dosage. 5. Generalized weakness and debility.  This is likely secondary to     dehydration but the patient may need PT/OT evaluation prior to     discharge to home. 6. Prophylaxis.  Protonix and SCDs.     Michiel Cowboy, MD    AVD/MEDQ  D:  05/30/2011  T:  05/30/2011  Job:  [id number]  cc:   Theressa Millard, M.D.  Electronically Signed by Therisa Doyne MD on 05/30/2011 06:52:32 AM

## 2011-05-31 LAB — URINE CULTURE

## 2011-05-31 LAB — IRON AND TIBC
Iron: 32 ug/dL — ABNORMAL LOW (ref 42–135)
Saturation Ratios: 9 % — ABNORMAL LOW (ref 20–55)
TIBC: 366 ug/dL (ref 250–470)
UIBC: 334 ug/dL

## 2011-05-31 LAB — CBC
HCT: 30.3 % — ABNORMAL LOW (ref 36.0–46.0)
Hemoglobin: 9.5 g/dL — ABNORMAL LOW (ref 12.0–15.0)
MCH: 24.8 pg — ABNORMAL LOW (ref 26.0–34.0)
MCHC: 31.4 g/dL (ref 30.0–36.0)
MCV: 79.1 fL (ref 78.0–100.0)
Platelets: 286 10*3/uL (ref 150–400)
RBC: 3.83 MIL/uL — ABNORMAL LOW (ref 3.87–5.11)
RDW: 15.6 % — ABNORMAL HIGH (ref 11.5–15.5)
WBC: 7.8 10*3/uL (ref 4.0–10.5)

## 2011-05-31 LAB — FERRITIN: Ferritin: 8 ng/mL — ABNORMAL LOW (ref 10–291)

## 2011-06-01 LAB — FOLATE RBC: RBC Folate: 946 ng/mL — ABNORMAL HIGH (ref >=366)

## 2011-06-10 NOTE — Discharge Summary (Signed)
  NAMEMarland Kitchen  IMAGINE, NEST NO.:  [id number]  MEDICAL RECORD NO.:  [id number]  LOCATION:  3031                         FACILITY:  [facility]  PHYSICIAN:  Theressa Millard, M.D.    DATE OF BIRTH:  April 19, 1930  DATE OF ADMISSION:  05/29/2011 DATE OF DISCHARGE:  06/01/2011                              DISCHARGE SUMMARY   ADMITTING DIAGNOSIS:  Vertigo with nausea and vomiting with associated headache.  DISCHARGE DIAGNOSES: 1. Possible migraine equivalent with vertigo and severe headache. 2. Hypertension. 3. History of depression. 4. Osteopenia. 5. Fibromyalgia, on chronic opiates. 6. Gastroesophageal reflux disease.  The patient is a 76 year old white female who has a long history of chronic pain attributed to fibromyalgia.  Orthopedic provides her on fentanyl patch regularly.  She has had intermittent episodes of dizziness which have frankly been rather hard to characterize, but seemed to be associated with some vertigo.  We have not really been able to establish an etiology.  She had another spell with severe vertigo, profound nausea and vomiting, and severe headache and came to the ED.  HOSPITAL COURSE:  The patient was admitted after a CT scan showed no evidence of hemorrhage.  Subsequent evaluation included an MRI and an MRA, which showed no evidence of stroke or significant vascular disease. Carotid Dopplers were negative as well.  Careful evaluation by Physical Therapy showed no evidence of BPPV and the patient's history was really not consistent with that.  I had a discussion with her and her granddaughter who is a Engineer, civil (consulting) at [employer], and there was suggestion that migraine could be a cause.  Therefore, she was started on topiramate 25 mg daily for 7 days, then increasing to twice daily. She tolerated initial doses of this and she felt better.  She is discharged improved condition.  In regard to ambulation, she does use a walker.  Physical Therapy did talk  to her about fixating on things in the visual field in order to decrease her imbalance.  She was already doing that to some extent, but had not been doing it regularly.  She has briefly gotten exercises from vestibular physical therapy.  DISCHARGE MEDICATIONS: 1. Topiramate 25 mg b.i.d., taking 1 at bedtime for the first week. 2. Artificial tears as needed. 3. Benazepril 20 mg daily. 4. Calcium carbonate daily. 5. Cyclobenzaprine 5 mg t.i.d. p.r.n. 6. Fentanyl patch 50 mcg apply every 48 hours. 7. Norvasc 10 mg daily. 8. Zofran 4 mg q.6 h. p.r.n. nausea and vomiting. 9. Oxycodone acetaminophen 10/325 every 4 hours as needed.  ACTIVITY:  See above.  DIET:  No added salt.  FOLLOWUP:  She has an appointment to see me in 5 days and the office will have her keep that appointment and follow up from there.     Theressa Millard, M.D.     JO/MEDQ  D:  06/01/2011  T:  06/01/2011  Job:  [id number]  Electronically Signed by Theressa Millard M.D. on 06/10/2011 09:30:09 PM

## 2012-01-25 ENCOUNTER — Inpatient Hospital Stay (HOSPITAL_COMMUNITY)
Admission: AD | Admit: 2012-01-25 | Discharge: 2012-01-25 | Disposition: A | Discharge status: Home / Self Care | Payer: Medicare Other | Source: Ambulatory Visit | Attending: Obstetrics and Gynecology | Admitting: Obstetrics and Gynecology

## 2012-01-25 ENCOUNTER — Encounter (HOSPITAL_COMMUNITY): Payer: Self-pay

## 2012-01-25 DIAGNOSIS — N938 Other specified abnormal uterine and vaginal bleeding: Secondary | ICD-10-CM | POA: Insufficient documentation

## 2012-01-25 DIAGNOSIS — I10 Essential (primary) hypertension: Secondary | ICD-10-CM | POA: Insufficient documentation

## 2012-01-25 DIAGNOSIS — N993 Prolapse of vaginal vault after hysterectomy: Secondary | ICD-10-CM | POA: Insufficient documentation

## 2012-01-25 DIAGNOSIS — N949 Unspecified condition associated with female genital organs and menstrual cycle: Secondary | ICD-10-CM | POA: Insufficient documentation

## 2012-01-25 HISTORY — DX: Unspecified osteoarthritis, unspecified site: M19.90

## 2012-01-25 HISTORY — DX: Reserved for concepts with insufficient information to code with codable children: IMO0002

## 2012-01-25 HISTORY — DX: Essential (primary) hypertension: I10

## 2012-01-25 LAB — URINALYSIS, ROUTINE W REFLEX MICROSCOPIC
Bilirubin Urine: NEGATIVE
Nitrite: NEGATIVE
Protein, ur: NEGATIVE mg/dL
Specific Gravity, Urine: 1.02 (ref 1.005–1.030)
Urobilinogen, UA: 0.2 mg/dL (ref 0.0–1.0)

## 2012-01-25 LAB — URINE MICROSCOPIC-ADD ON

## 2012-01-25 NOTE — H&P (Signed)
78 y/o presents c/o of pelvic pressure. She has a h/o of pelvic prolapse / large cystocele that had been managed with gelhorn pessary until 01/23/2012 when she was seen in the office. The 2 3/4" gelhorn pessary was removed in the office on 01/23/2012 due to intermittent incontinence. Pt had requested to try a different type of pessary. She was fitted for a incontinence ring pessary. The pessary was ordered 01/23/2012 however has not arrived.  Pt is unable to empty her bladder completely unless she splints since her gelhorn pessary was removed. She has had increased pressure as well as vaginal spotting since the pessary was removed.   Pt also has hypertension she has not taken her bp medication today.    PMH htn Degenerative disc disease Arthritis  Meds: amilodipine / benazapril  Allergies Feldene / Clinoril   ROS negative except as stated in HPI   PSH hysterectomy Right total knee arthoplasty chlocystectomy  VS BP 172/64 HR 66 RR 14 temp 98.7  General allert and oriented no acute distress Lungs normal respiratory effort Abd soft nontender  Pelvic  Large cystocele ... No vaginal bleeding appreciated... Gelhorn pessary placed without difficulty  Ext: no edema appreciated      A/P 78 y/o with pelvic prolapse/ cystocele/ pelvic pressure and vaginal spotting / Htn  check urinalysis  Gelhorn pessary replaced. Until pessary incontinence arrives in the office. Follow in office in 1-2 wks  Htn. Pt encouraged to take her bp medication regularly. She is to follow up with her PCP

## 2012-01-25 NOTE — MAU Note (Addendum)
Pt states has vaginal pressure and burning after pessary being taken out on Tuesday. Is only able to urinate after pushing her bladder back in place but then it hurts.

## 2012-01-25 NOTE — Progress Notes (Signed)
Dr. Richardson Dopp notified of bp 172/64. Pt states hasn't taken her blood pressure medicine yet today b/c she was getting ready to come to [facility]U. Pt will take her BP medicine when she gets home.

## 2013-01-01 DIAGNOSIS — R269 Unspecified abnormalities of gait and mobility: Secondary | ICD-10-CM | POA: Insufficient documentation

## 2013-01-13 ENCOUNTER — Other Ambulatory Visit: Payer: Self-pay | Admitting: Neurology

## 2013-05-07 ENCOUNTER — Ambulatory Visit: Payer: Medicare Other | Attending: Internal Medicine | Admitting: Physical Therapy

## 2013-05-07 DIAGNOSIS — Z9181 History of falling: Secondary | ICD-10-CM | POA: Insufficient documentation

## 2013-05-07 DIAGNOSIS — R5381 Other malaise: Secondary | ICD-10-CM | POA: Insufficient documentation

## 2013-05-07 DIAGNOSIS — R262 Difficulty in walking, not elsewhere classified: Secondary | ICD-10-CM | POA: Insufficient documentation

## 2013-05-07 DIAGNOSIS — IMO0001 Reserved for inherently not codable concepts without codable children: Secondary | ICD-10-CM | POA: Insufficient documentation

## 2013-05-11 ENCOUNTER — Ambulatory Visit: Payer: Medicare Other | Admitting: Physical Therapy

## 2013-05-14 ENCOUNTER — Ambulatory Visit: Payer: Medicare Other

## 2013-05-18 ENCOUNTER — Ambulatory Visit: Payer: Medicare Other | Admitting: Physical Therapy

## 2013-05-21 ENCOUNTER — Ambulatory Visit: Payer: Medicare Other | Admitting: Physical Therapy

## 2013-05-27 ENCOUNTER — Ambulatory Visit (INDEPENDENT_AMBULATORY_CARE_PROVIDER_SITE_OTHER): Payer: Medicare Other | Admitting: Neurology

## 2013-05-27 ENCOUNTER — Encounter: Payer: Self-pay | Admitting: Neurology

## 2013-05-27 VITALS — BP 152/66 | HR 65 | Ht 62.5 in | Wt 179.0 lb

## 2013-05-27 DIAGNOSIS — G250 Essential tremor: Secondary | ICD-10-CM

## 2013-05-27 DIAGNOSIS — G43009 Migraine without aura, not intractable, without status migrainosus: Secondary | ICD-10-CM

## 2013-05-27 DIAGNOSIS — R269 Unspecified abnormalities of gait and mobility: Secondary | ICD-10-CM

## 2013-05-27 DIAGNOSIS — G252 Other specified forms of tremor: Secondary | ICD-10-CM

## 2013-05-27 DIAGNOSIS — M47812 Spondylosis without myelopathy or radiculopathy, cervical region: Secondary | ICD-10-CM

## 2013-05-27 MED ORDER — PROPRANOLOL HCL 10 MG PO TABS: 30.0000 mg | ORAL_TABLET | Freq: Two times a day (BID) | ORAL | Status: DC

## 2013-05-27 NOTE — Progress Notes (Signed)
Reason for visit: Tremor  Dominique Mckee is an 79 y.o. female  History of present illness:  Dominique Mckee is an 79 year old right-handed white female with a history of tremors affecting both upper extremities. The patient was felt to have a benign essential tremor. The patient was placed on propranolol taking 20 mg twice daily. The patient believes that she has gotten some improvement the tremors, but the tremors persist to some degree. The propranolol has also helped her headaches and vertigo that were felt to be due to migraine. The patient has the diagnosis of rheumatoid arthritis, and she has neck pain, and problems with the feet bilaterally. The patient is now wearing braces on the ankles and feet, and she is currently engaged in physical therapy for balance training. The patient indicates that over the last year, her balance has changed significantly. The patient continues to fall intermittently, with the last fall approximately 3 weeks ago. The patient also reports tingling sensations in the hands. The patient returns for an evaluation. Since last seen, the patient indicates that she feels as if she has a tremor at times involving the tongue, and she has noted a jaw tremor at times.  Past Medical History  Diagnosis Date  . Hypertension   . Arthritis   . Degenerative disc disease   . Benign essential tremor   . Migraine headache   . Cervicogenic headache   . Cervical spondylosis   . Lumbar spondylosis   . Gait disorder   . Mild obesity   . GERD (gastroesophageal reflux disease)   . Osteopenia   . Chronic renal insufficiency   . Fibromyalgia   . Rheumatoid arthritis     Past Surgical History  Procedure Laterality Date  . Total knee arthroplasty Bilateral     right  . Abdominal hysterectomy    . Cholecystectomy    . Elbow surgery Left   . Hammer toe surgery Left   . Tonsillectomy    . Appendectomy      Family History  Problem Relation Age of Onset  . Anesthesia problems  Neg Hx   . Hypertension Neg Hx   . Aortic aneurysm Father   . Lung cancer Sister   . Lung cancer Brother   . Alzheimer's disease Brother   . Dementia Brother   . Parkinsonism Brother   . Bone cancer Sister     Social history:  reports that she has never smoked. She does not have any smokeless tobacco history on file. She reports that she does not drink alcohol or use illicit drugs.  Allergies:  Allergies  Allergen Reactions  . Feldene (Piroxicam)     Kidney issues  . Clinoril (Sulindac) Rash    Medications:  Current Outpatient Prescriptions on File Prior to Visit  Medication Sig Dispense Refill  . amLODipine (NORVASC) 5 MG tablet Take 5 mg by mouth every morning.      . benazepril (LOTENSIN) 20 MG tablet Take 20 mg by mouth every morning.      . Calcium Carb-Cholecalciferol (CALCIUM 500 +D PO) Take 1 tablet by mouth 2 (two) times daily. Calcium 500 mg & Vitamin D 1000 units      . diclofenac sodium (VOLTAREN) 1 % GEL Apply 1 application topically 2 (two) times daily as needed. For pain      . ferrous sulfate 325 (65 FE) MG tablet Take 325 mg by mouth every evening.       No current facility-administered medications on file prior  to visit.    ROS:  Out of a complete 14 system review of symptoms, the patient complains only of the following symptoms, and all other reviewed systems are negative.  Weight loss, fatigue Swelling in the legs Blurred vision, eye pain Anemia Feeling hot, flushing Joint pain, joint swelling, achy muscles Runny nose Headache, numbness, weakness, tremor Depression, sleepiness  Blood pressure 152/66, pulse 65, height 5' 2.5" (1.588 m), weight 179 lb (81.194 kg).  Physical Exam  General: The patient is alert and cooperative at the time of the examination. The patient is mildly to moderately obese.  Skin: Bilateral ankle braces are present.   Neurologic Exam  Cranial nerves: Facial symmetry is present. Speech is normal, no aphasia or  dysarthria is noted. Extraocular movements are full. Visual fields are full.  Motor: The patient has good strength in all 4 extremities.  Coordination: The patient has good finger-nose-finger and heel-to-shin bilaterally. The patient has an intention tremor with finger-nose-finger bilaterally.  Gait and station: The patient has a slow, wide-based gait. The patient uses a cane for ambulation. Tandem gait was not attempted. Romberg is negative. No drift is seen.  Reflexes: Deep tendon reflexes are symmetric.   Assessment/Plan:  1. Benign essential tremor  2. Gait disorder  3. Rheumatoid arthritis  4. Migraine headache, vertigo  The propranolol has helped the headaches and the tremor. The patient has a significant gait disorder. She uses a cane, but she also indicates that she now has a walker for ambulation. When last seen, a CT scan of the brain was ordered, but this was denied through her insurance. MRI evaluation was ordered, but this was never done. MRI of the brain will be reordered, and the patient will continue with physical therapy for gait training. We may consider nerve conduction studies of both arms if the hand symptoms of numbness continue. The patient could have carpal tunnel syndrome. The patient followup in 4-6 months.  Marlan Palau MD 05/27/2013 8:01 PM  [facility] [address] [place], [place] [address]  Phone [phone] Fax [phone]

## 2013-05-29 ENCOUNTER — Ambulatory Visit: Payer: Medicare Other | Admitting: Physical Therapy

## 2013-06-01 ENCOUNTER — Ambulatory Visit: Payer: Medicare Other | Attending: Internal Medicine | Admitting: Physical Therapy

## 2013-06-01 DIAGNOSIS — IMO0001 Reserved for inherently not codable concepts without codable children: Secondary | ICD-10-CM | POA: Insufficient documentation

## 2013-06-01 DIAGNOSIS — Z9181 History of falling: Secondary | ICD-10-CM | POA: Insufficient documentation

## 2013-06-01 DIAGNOSIS — R262 Difficulty in walking, not elsewhere classified: Secondary | ICD-10-CM | POA: Insufficient documentation

## 2013-06-01 DIAGNOSIS — R5381 Other malaise: Secondary | ICD-10-CM | POA: Insufficient documentation

## 2013-06-11 DIAGNOSIS — R51 Headache: Secondary | ICD-10-CM

## 2013-06-12 ENCOUNTER — Other Ambulatory Visit: Payer: Self-pay | Admitting: Neurology

## 2013-06-12 ENCOUNTER — Telehealth: Payer: Self-pay | Admitting: Neurology

## 2013-06-12 DIAGNOSIS — G252 Other specified forms of tremor: Secondary | ICD-10-CM

## 2013-06-12 DIAGNOSIS — R269 Unspecified abnormalities of gait and mobility: Secondary | ICD-10-CM

## 2013-06-12 DIAGNOSIS — G43009 Migraine without aura, not intractable, without status migrainosus: Secondary | ICD-10-CM

## 2013-06-12 NOTE — Telephone Encounter (Signed)
I called patient. The MRI the brain shows a moderate level small vessel disease, which could impact the walking to some degree. The patient also may have some spinal stenosis in the cervical spine. The patient has a history of rheumatoid arthritis. I would recommend getting a MRI of the cervical spine as well. If the patient is amenable to this, she will contact our office.

## 2013-06-15 ENCOUNTER — Telehealth: Payer: Self-pay | Admitting: Neurology

## 2013-06-15 DIAGNOSIS — R269 Unspecified abnormalities of gait and mobility: Secondary | ICD-10-CM

## 2013-06-15 NOTE — Telephone Encounter (Signed)
I'll get the MRI study of the cervical spine done.

## 2013-06-15 NOTE — Telephone Encounter (Signed)
Patient left message that she is agreeable to having a MRI of her cervical spine.

## 2013-07-02 DIAGNOSIS — R269 Unspecified abnormalities of gait and mobility: Secondary | ICD-10-CM

## 2013-07-03 ENCOUNTER — Telehealth: Payer: Self-pay | Admitting: Neurology

## 2013-07-03 ENCOUNTER — Other Ambulatory Visit: Payer: Self-pay | Admitting: Neurology

## 2013-07-03 DIAGNOSIS — R269 Unspecified abnormalities of gait and mobility: Secondary | ICD-10-CM

## 2013-07-03 NOTE — Telephone Encounter (Signed)
I called the patient. The MRI the cervical spine shows some mild compression of the spinal cord at the C3-4 level. No evidence of cord signal. At age 79, I would probably not recommend surgery. The patient does have gait instability, and we will consider nerve conduction studies at some point in the future to exclude the possibility of a peripheral neuropathy.

## 2013-10-05 ENCOUNTER — Other Ambulatory Visit: Payer: Self-pay | Admitting: Neurology

## 2013-11-19 ENCOUNTER — Emergency Department (HOSPITAL_COMMUNITY): Payer: Medicare HMO

## 2013-11-19 ENCOUNTER — Encounter (HOSPITAL_COMMUNITY): Payer: Self-pay | Admitting: Emergency Medicine

## 2013-11-19 ENCOUNTER — Emergency Department (HOSPITAL_COMMUNITY)
Admission: EM | Admit: 2013-11-19 | Discharge: 2013-11-19 | Disposition: A | Discharge status: Home / Self Care | Payer: Medicare HMO | Attending: Emergency Medicine | Admitting: Emergency Medicine

## 2013-11-19 DIAGNOSIS — W1809XA Striking against other object with subsequent fall, initial encounter: Secondary | ICD-10-CM | POA: Insufficient documentation

## 2013-11-19 DIAGNOSIS — Z8669 Personal history of other diseases of the nervous system and sense organs: Secondary | ICD-10-CM | POA: Insufficient documentation

## 2013-11-19 DIAGNOSIS — S0083XA Contusion of other part of head, initial encounter: Principal | ICD-10-CM

## 2013-11-19 DIAGNOSIS — I129 Hypertensive chronic kidney disease with stage 1 through stage 4 chronic kidney disease, or unspecified chronic kidney disease: Secondary | ICD-10-CM | POA: Insufficient documentation

## 2013-11-19 DIAGNOSIS — E669 Obesity, unspecified: Secondary | ICD-10-CM | POA: Insufficient documentation

## 2013-11-19 DIAGNOSIS — S0003XA Contusion of scalp, initial encounter: Secondary | ICD-10-CM | POA: Insufficient documentation

## 2013-11-19 DIAGNOSIS — S1093XA Contusion of unspecified part of neck, initial encounter: Principal | ICD-10-CM

## 2013-11-19 DIAGNOSIS — IMO0002 Reserved for concepts with insufficient information to code with codable children: Secondary | ICD-10-CM | POA: Insufficient documentation

## 2013-11-19 DIAGNOSIS — Y9389 Activity, other specified: Secondary | ICD-10-CM | POA: Insufficient documentation

## 2013-11-19 DIAGNOSIS — Z79899 Other long term (current) drug therapy: Secondary | ICD-10-CM | POA: Insufficient documentation

## 2013-11-19 DIAGNOSIS — M810 Age-related osteoporosis without current pathological fracture: Secondary | ICD-10-CM | POA: Insufficient documentation

## 2013-11-19 DIAGNOSIS — W19XXXA Unspecified fall, initial encounter: Secondary | ICD-10-CM

## 2013-11-19 DIAGNOSIS — R269 Unspecified abnormalities of gait and mobility: Secondary | ICD-10-CM | POA: Insufficient documentation

## 2013-11-19 DIAGNOSIS — Y929 Unspecified place or not applicable: Secondary | ICD-10-CM | POA: Insufficient documentation

## 2013-11-19 DIAGNOSIS — M129 Arthropathy, unspecified: Secondary | ICD-10-CM | POA: Insufficient documentation

## 2013-11-19 DIAGNOSIS — M069 Rheumatoid arthritis, unspecified: Secondary | ICD-10-CM | POA: Insufficient documentation

## 2013-11-19 DIAGNOSIS — R112 Nausea with vomiting, unspecified: Secondary | ICD-10-CM | POA: Insufficient documentation

## 2013-11-19 DIAGNOSIS — N189 Chronic kidney disease, unspecified: Secondary | ICD-10-CM | POA: Insufficient documentation

## 2013-11-19 DIAGNOSIS — K219 Gastro-esophageal reflux disease without esophagitis: Secondary | ICD-10-CM | POA: Insufficient documentation

## 2013-11-19 LAB — CBC WITH DIFFERENTIAL/PLATELET
BASOS ABS: 0 10*3/uL (ref 0.0–0.1)
Basophils Relative: 0 % (ref 0–1)
Eosinophils Absolute: 0.1 10*3/uL (ref 0.0–0.7)
Eosinophils Relative: 1 % (ref 0–5)
HEMATOCRIT: 35.4 % — AB (ref 36.0–46.0)
Hemoglobin: 11.6 g/dL — ABNORMAL LOW (ref 12.0–15.0)
LYMPHS ABS: 1.5 10*3/uL (ref 0.7–4.0)
LYMPHS PCT: 13 % (ref 12–46)
MCH: 30.6 pg (ref 26.0–34.0)
MCHC: 32.8 g/dL (ref 30.0–36.0)
MCV: 93.4 fL (ref 78.0–100.0)
Monocytes Absolute: 0.6 10*3/uL (ref 0.1–1.0)
Monocytes Relative: 5 % (ref 3–12)
NEUTROS ABS: 9.6 10*3/uL — AB (ref 1.7–7.7)
Neutrophils Relative %: 81 % — ABNORMAL HIGH (ref 43–77)
PLATELETS: 268 10*3/uL (ref 150–400)
RBC: 3.79 MIL/uL — AB (ref 3.87–5.11)
RDW: 15.1 % (ref 11.5–15.5)
WBC: 11.9 10*3/uL — AB (ref 4.0–10.5)

## 2013-11-19 LAB — BASIC METABOLIC PANEL
BUN: 30 mg/dL — ABNORMAL HIGH (ref 6–23)
CHLORIDE: 101 meq/L (ref 96–112)
CO2: 25 meq/L (ref 19–32)
Calcium: 8.7 mg/dL (ref 8.4–10.5)
Creatinine, Ser: 1.1 mg/dL (ref 0.50–1.10)
GFR calc Af Amer: 53 mL/min — ABNORMAL LOW (ref >90)
GFR calc non Af Amer: 46 mL/min — ABNORMAL LOW (ref >90)
Glucose, Bld: 135 mg/dL — ABNORMAL HIGH (ref 70–99)
Potassium: 4.7 mEq/L (ref 3.7–5.3)
SODIUM: 139 meq/L (ref 137–147)

## 2013-11-19 MED ORDER — METOCLOPRAMIDE HCL 5 MG/ML IJ SOLN
10.0000 mg | Freq: Once | INTRAMUSCULAR | Status: AC
  Administered 2013-11-19: 10 mg via INTRAVENOUS
  Filled 2013-11-19: qty 2

## 2013-11-19 MED ORDER — ONDANSETRON HCL 4 MG/2ML IJ SOLN
4.0000 mg | Freq: Once | INTRAMUSCULAR | Status: AC
  Administered 2013-11-19: 4 mg via INTRAVENOUS
  Filled 2013-11-19: qty 2

## 2013-11-19 MED ORDER — ONDANSETRON 4 MG PO TBDP: 4.0000 mg | ORAL_TABLET | Freq: Three times a day (TID) | ORAL | Status: DC | PRN

## 2013-11-19 MED ORDER — SODIUM CHLORIDE 0.9 % IV BOLUS (SEPSIS)
500.0000 mL | Freq: Once | INTRAVENOUS | Status: AC
  Administered 2013-11-19: 500 mL via INTRAVENOUS

## 2013-11-19 NOTE — ED Notes (Signed)
Pt mis-stepped and fell straight back 2 feet.  Baseball size hematoma to back of head.  Pt vomiting after 8 of zofran.  PERRL.  Per ems, pt falls asleep very easily.

## 2013-11-19 NOTE — ED Provider Notes (Signed)
CSN: [id number]     Arrival date & time 11/19/13  1733 History   First MD Initiated Contact with Patient 11/19/13 1758     Chief Complaint  Patient presents with  . Fall   (Consider location/radiation/quality/duration/timing/severity/associated sxs/prior Treatment) Patient is a 80 y.o. female presenting with fall. The history is provided by the patient and a relative.  Fall This is a new problem. The current episode started today. The problem occurs rarely. The problem has been gradually improving. Associated symptoms include nausea and vomiting. Pertinent negatives include no abdominal pain, anorexia, arthralgias, chest pain, chills, congestion, coughing, fatigue, fever, headaches, myalgias, rash, sore throat, visual change or weakness. Nothing aggravates the symptoms. She has tried nothing for the symptoms. The treatment provided mild relief.    Past Medical History  Diagnosis Date  . Hypertension   . Arthritis   . Degenerative disc disease   . Benign essential tremor   . Migraine headache   . Cervicogenic headache   . Cervical spondylosis   . Lumbar spondylosis   . Gait disorder   . Mild obesity   . GERD (gastroesophageal reflux disease)   . Osteopenia   . Chronic renal insufficiency   . Fibromyalgia   . Rheumatoid arthritis    Past Surgical History  Procedure Laterality Date  . Total knee arthroplasty Bilateral     right  . Abdominal hysterectomy    . Cholecystectomy    . Elbow surgery Left   . Hammer toe surgery Left   . Tonsillectomy    . Appendectomy     Family History  Problem Relation Age of Onset  . Anesthesia problems Neg Hx   . Hypertension Neg Hx   . Aortic aneurysm Father   . Lung cancer Sister   . Lung cancer Brother   . Alzheimer's disease Brother   . Dementia Brother   . Parkinsonism Brother   . Bone cancer Sister    History  Substance Use Topics  . Smoking status: Never Smoker   . Smokeless tobacco: Not on file  . Alcohol Use: No   OB  History   Grav Para Term Preterm Abortions TAB SAB Ect Mult Living                 Review of Systems  Constitutional: Negative for fever, chills and fatigue.  HENT: Negative for congestion, rhinorrhea and sore throat.   Eyes: Negative for redness and visual disturbance.  Respiratory: Negative for cough, shortness of breath and wheezing.   Cardiovascular: Negative for chest pain and palpitations.  Gastrointestinal: Positive for nausea and vomiting. Negative for abdominal pain and anorexia.  Genitourinary: Negative for dysuria and urgency.  Musculoskeletal: Negative for arthralgias and myalgias.  Skin: Negative for pallor, rash and wound.  Neurological: Negative for dizziness, weakness and headaches.    Allergies  Feldene and Clinoril  Home Medications   Current Outpatient Rx  Name  Route  Sig  Dispense  Refill  . amLODipine (NORVASC) 5 MG tablet   Oral   Take 5 mg by mouth every morning.         . benazepril (LOTENSIN) 20 MG tablet   Oral   Take 20 mg by mouth every morning.         . Calcium Carb-Cholecalciferol (CALCIUM 500 +D PO)   Oral   Take 1 tablet by mouth 2 (two) times daily. Calcium 500 mg & Vitamin D 1000 units         . diclofenac  sodium (VOLTAREN) 1 % GEL   Topical   Apply 1 application topically 2 (two) times daily as needed. For pain         . DULoxetine (CYMBALTA) 30 MG capsule   Oral   Take 30 mg by mouth daily.         . fentaNYL (DURAGESIC - DOSED MCG/HR) 25 MCG/HR   Transdermal   Place 1 patch onto the skin every 3 (three) days.         . ferrous sulfate 325 (65 FE) MG tablet   Oral   Take 325 mg by mouth every evening.         . hydrochlorothiazide (HYDRODIURIL) 25 MG tablet   Oral   Take 12.5 mg by mouth daily.          . Oxycodone HCl 10 MG TABS   Oral   Take 10 mg by mouth 2 (two) times daily.          . propranolol (INDERAL) 10 MG tablet      take 3 tablets by mouth twice a day   180 tablet   3   . ranitidine  (ZANTAC) 150 MG capsule   Oral   Take 150 mg by mouth 2 (two) times daily.         . ondansetron (ZOFRAN ODT) 4 MG disintegrating tablet   Oral   Take 1 tablet (4 mg total) by mouth every 8 (eight) hours as needed for nausea or vomiting.   20 tablet   0    BP 121/53  Pulse 61  Temp(Src) 97.7 F (36.5 C) (Oral)  Resp 12  Ht 5\' 3"  (1.6 m)  Wt 197 lb (89.359 kg)  BMI 34.91 kg/m2  SpO2 95% Physical Exam  Constitutional: She is oriented to person, place, and time. She appears well-developed and well-nourished. No distress.  HENT:  Head: Normocephalic.  Large hematoma noted to the left occipital region. No noted active bleeding. no noted in the laceration  Eyes: EOM are normal. Pupils are equal, round, and reactive to light.  Neck: Normal range of motion. Neck supple.  Cardiovascular: Normal rate and regular rhythm.  Exam reveals no gallop and no friction rub.   No murmur heard. Pulmonary/Chest: Effort normal. She has no wheezes. She has no rales.  Abdominal: Soft. She exhibits no distension. There is no tenderness.  Musculoskeletal: She exhibits no edema and no tenderness.  Neurological: She is alert and oriented to person, place, and time.  Skin: Skin is warm and dry. She is not diaphoretic.  Psychiatric: She has a normal mood and affect. Her behavior is normal.    ED Course  Procedures (including critical care time) Labs Review Labs Reviewed  CBC WITH DIFFERENTIAL - Abnormal; Notable for the following:    WBC 11.9 (*)    RBC 3.79 (*)    Hemoglobin 11.6 (*)    HCT 35.4 (*)    Neutrophils Relative % 81 (*)    Neutro Abs 9.6 (*)    All other components within normal limits  BASIC METABOLIC PANEL - Abnormal; Notable for the following:    Glucose, Bld 135 (*)    BUN 30 (*)    GFR calc non Af Amer 46 (*)    GFR calc Af Amer 53 (*)    All other components within normal limits   Imaging Review Ct Head Wo Contrast  11/19/2013   CLINICAL DATA:  Loss of balance with  resulting fall and head  injury. Headache.  EXAM: CT HEAD WITHOUT CONTRAST  CT CERVICAL SPINE WITHOUT CONTRAST  TECHNIQUE: Multidetector CT imaging of the head and cervical spine was performed following the standard protocol without intravenous contrast. Multiplanar CT image reconstructions of the cervical spine were also generated.  COMPARISON:  MR MRA HEAD W/O CM dated 05/30/2011; CT HEAD W/O CM dated 05/29/2011  FINDINGS: CT HEAD FINDINGS  There is no evidence of acute intracranial hemorrhage, mass lesion, brain edema or extra-axial fluid collection. Mild generalized atrophy and periventricular white matter disease appears stable. There is no hydrocephalus or evidence of acute stroke.  There is a large left parietal occipital scalp hematoma. No underlying calvarial fracture is demonstrated. The mastoid air cells and middle ears are clear. The visualized paranasal sinuses are clear.  CT CERVICAL SPINE FINDINGS  There is multilevel cervical spondylosis with disc space loss, uncinate spurring and facet disease. There is a grade 1 anterolisthesis at C7-T1. No acute fracture or traumatic subluxation identified.  There are no acute soft tissue findings in the neck. The carotid arteries are tortuous and have a retropharyngeal course. There is scarring in both lung apices.  IMPRESSION: 1. Large left parietal occipital scalp hematoma. 2. No acute intracranial or calvarial findings. 3. No evidence of acute cervical spine fracture, traumatic subluxation or static signs of instability. Diffuse spondylosis.   Electronically Signed   By: Camie Patience M.D.   On: 11/19/2013 19:07   Ct Cervical Spine Wo Contrast  11/19/2013   CLINICAL DATA:  Loss of balance with resulting fall and head injury. Headache.  EXAM: CT HEAD WITHOUT CONTRAST  CT CERVICAL SPINE WITHOUT CONTRAST  TECHNIQUE: Multidetector CT imaging of the head and cervical spine was performed following the standard protocol without intravenous contrast. Multiplanar CT  image reconstructions of the cervical spine were also generated.  COMPARISON:  MR MRA HEAD W/O CM dated 05/30/2011; CT HEAD W/O CM dated 05/29/2011  FINDINGS: CT HEAD FINDINGS  There is no evidence of acute intracranial hemorrhage, mass lesion, brain edema or extra-axial fluid collection. Mild generalized atrophy and periventricular white matter disease appears stable. There is no hydrocephalus or evidence of acute stroke.  There is a large left parietal occipital scalp hematoma. No underlying calvarial fracture is demonstrated. The mastoid air cells and middle ears are clear. The visualized paranasal sinuses are clear.  CT CERVICAL SPINE FINDINGS  There is multilevel cervical spondylosis with disc space loss, uncinate spurring and facet disease. There is a grade 1 anterolisthesis at C7-T1. No acute fracture or traumatic subluxation identified.  There are no acute soft tissue findings in the neck. The carotid arteries are tortuous and have a retropharyngeal course. There is scarring in both lung apices.  IMPRESSION: 1. Large left parietal occipital scalp hematoma. 2. No acute intracranial or calvarial findings. 3. No evidence of acute cervical spine fracture, traumatic subluxation or static signs of instability. Diffuse spondylosis.   Electronically Signed   By: Camie Patience M.D.   On: 11/19/2013 19:07   Dg Chest Portable 1 View  11/19/2013   CLINICAL DATA:  Status post fall.  Hypertension.  EXAM: PORTABLE CHEST - 1 VIEW  COMPARISON:  November 19, 2010 1:33 a.m.  FINDINGS: The heart size and mediastinal contours are stable. The aorta is tortuous. The heart size is enlarged. Both lungs are clear. The visualized skeletal structures are unremarkable.  IMPRESSION: No active cardiopulmonary disease.   Electronically Signed   By: Abelardo Diesel M.D.   On: 11/19/2013 19:43  EKG Interpretation    Date/Time:  Thursday November 19 2013 17:45:38 EST Ventricular Rate:  62 PR Interval:  174 QRS Duration: 162 QT  Interval:  437 QTC Calculation: 444 R Axis:   97 Text Interpretation:  Sinus rhythm Right bundle branch block No significant change since last tracing Confirmed by SHELDON  MD, CHARLES ([id number]) on 11/19/2013 5:55:51 PM            MDM   1. Fall   2. Scalp hematoma     Patient is 80 year old female the chief complaint of fall. Patient states that it was a mechanical fall she is reaching for the rail and fell backwards and struck her head. Patient denies any chest pain shortness of breath prior to this event. Patient denies any syncopal events. The patient had a hematoma to the left parietal region family then called EMS to bring her here. En route patient had a few vomiting episodes despite 8 mg of Zofran.   On exam patient slow to respond have some tenderness along the lower C-spine and left occipital region. No noted laceration on the distal initial evaluation.   Will CT head C-spine treat nausea and give some fluids.  Patient with negative CT head CT C-spine chest x-ray. Patient appears well stimulatory Will discharge home to follow up with PCP.    I have discussed the diagnosis/risks/treatment options with the patient and believe the pt to be eligible for discharge home to follow-up with PCP. We also discussed returning to the ED immediately if new or worsening sx occur. We discussed the sx which are most concerning (e.g., change in mental status) that necessitate immediate return. Any new prescriptions provided to the patient are listed below.  Discharge Medication List as of 11/19/2013  9:17 PM    START taking these medications   Details  ondansetron (ZOFRAN ODT) 4 MG disintegrating tablet Take 1 tablet (4 mg total) by mouth every 8 (eight) hours as needed for nausea or vomiting., Starting 11/19/2013, Until Discontinued, Print         Deno Etienne, MD 11/19/13 [phone]

## 2013-11-19 NOTE — ED Notes (Signed)
C collar removed

## 2013-11-19 NOTE — ED Notes (Signed)
Ambulated to the Br without difficulty and back to the room and then wanted to walk in the hall.  Did real well walking but got dizzy when she laid back down in the bed.

## 2013-11-19 NOTE — ED Notes (Signed)
Resident notified of pt condition.

## 2013-11-19 NOTE — Discharge Instructions (Signed)
Follow up with your PCP, take motrin or tylenol for pain. Follow up with your PCP. Facial or Scalp Contusion  A facial or scalp contusion is a deep bruise on the face or head. Contusions happen when an injury causes bleeding under the skin. Signs of bruising include pain, puffiness (swelling), and discolored skin. The contusion may turn blue, purple, or yellow. HOME CARE  Only take medicines as told by your doctor.  Put ice on the injured area.  Put ice in a plastic bag.  Place a towel between your skin and the bag.  Leave the ice on for 20 minutes, 2 3 times a day. GET HELP IF:  You have bite problems.  You have pain when chewing.  You are worried about your face not healing normally. GET HELP RIGHT AWAY IF:   You have severe pain or a headache and medicine does not help.  You are very tired or confused, or your personality changes.  You throw up (vomit).  You have a nosebleed that will not stop.  You see two of everything (double vision) or have blurry vision.  You have fluid coming from your nose or ear.  You have problems walking or using your arms or legs. MAKE SURE YOU:   Understand these instructions.  Will watch your condition.  Will get help right away if you are not doing well or get worse. Document Released: 10/04/2011 Document Revised: 08/05/2013 Document Reviewed: 05/28/2013 [facility] Patient Information 2014 [place], [place].

## 2013-11-20 NOTE — ED Provider Notes (Signed)
I saw and evaluated the patient, reviewed the resident's note and I agree with the findings and plan.  EKG Interpretation    Date/Time:  Thursday November 19 2013 17:45:38 EST Ventricular Rate:  62 PR Interval:  174 QRS Duration: 162 QT Interval:  437 QTC Calculation: 444 R Axis:   97 Text Interpretation:  Sinus rhythm Right bundle branch block No significant change since last tracing Confirmed by SHELDON  MD, CHARLES ([id number]) on 11/19/2013 5:55:51 PM            Mechanical fall, neg imaging. Ambulates with mild dizziness, but steady and safe to go home.   Charles B. Karle Starch, MD 11/20/13 Benancio Deeds

## 2013-12-16 ENCOUNTER — Ambulatory Visit: Payer: Medicare Other | Admitting: Neurology

## 2013-12-21 ENCOUNTER — Telehealth: Payer: Self-pay | Admitting: Neurology

## 2013-12-21 ENCOUNTER — Ambulatory Visit: Payer: Medicare Other | Admitting: Neurology

## 2013-12-21 NOTE — Telephone Encounter (Signed)
This patient did not show for a revisit appointment today. 

## 2013-12-23 ENCOUNTER — Telehealth: Payer: Self-pay | Admitting: Neurology

## 2013-12-23 NOTE — Telephone Encounter (Signed)
Pt had appt scheduled for 2-23 but had to cancel.  She was calling to reschedule.  During call she stated that on 12-20-13 she fell and had a concussion.  They took her to the ER and they stated she had bleeding on the outside of her brain and to follow up with our office.  Since that incident she has been very dizzy.  She would like to be seen before August 17th.  Please call to discuss.  Thank you

## 2013-12-23 NOTE — Telephone Encounter (Signed)
Information was given to Dr. Jannifer Franklin assistant Davy Pique, CMA and she is taking care of it.

## 2013-12-30 ENCOUNTER — Encounter: Payer: Self-pay | Admitting: Neurology

## 2013-12-30 ENCOUNTER — Encounter (INDEPENDENT_AMBULATORY_CARE_PROVIDER_SITE_OTHER): Payer: Self-pay

## 2013-12-30 ENCOUNTER — Ambulatory Visit (INDEPENDENT_AMBULATORY_CARE_PROVIDER_SITE_OTHER): Payer: Commercial Managed Care - HMO | Admitting: Neurology

## 2013-12-30 VITALS — BP 121/59 | HR 58 | Wt 194.0 lb

## 2013-12-30 DIAGNOSIS — R269 Unspecified abnormalities of gait and mobility: Secondary | ICD-10-CM

## 2013-12-30 DIAGNOSIS — G252 Other specified forms of tremor: Secondary | ICD-10-CM

## 2013-12-30 DIAGNOSIS — R42 Dizziness and giddiness: Secondary | ICD-10-CM

## 2013-12-30 DIAGNOSIS — G43009 Migraine without aura, not intractable, without status migrainosus: Secondary | ICD-10-CM

## 2013-12-30 DIAGNOSIS — G250 Essential tremor: Secondary | ICD-10-CM

## 2013-12-30 DIAGNOSIS — S060X0A Concussion without loss of consciousness, initial encounter: Secondary | ICD-10-CM

## 2013-12-30 NOTE — Patient Instructions (Signed)

## 2013-12-30 NOTE — Progress Notes (Signed)
Reason for visit: Tremor  Dominique Mckee is an 80 y.o. female  History of present illness:  Dominique Mckee is an 80 year old right-handed white female with a history of a benign essential tremor, and a history of a gait disorder. The patient has rheumatoid arthritis, and a recent MRI of the cervical spine shows mild cord impingement at the C3-4 level. The patient recently fell going up some stairs into her house, falling backwards on 11/19/2013. The patient struck her head, went to the emergency room, and a CT scan showed a scalp hematoma, but no intracranial hemorrhage. The patient did not lose consciousness. The patient has a history of benign positional vertigo, but this has worsened since the fall. The patient is having episodes of vertigo when she lies down at night every night at this time. The patient is now using a walker for ambulation. The patient was using a cane previously. The patient reports no new numbness or weakness of the extremities. The patient has a lot of pain in the feet, right greater than left. The patient has had an increase in her neck pain and shoulder discomfort. The patient comes to this office for an evaluation.  Past Medical History  Diagnosis Date  . Hypertension   . Arthritis   . Degenerative disc disease   . Benign essential tremor   . Migraine headache   . Cervicogenic headache   . Cervical spondylosis   . Lumbar spondylosis   . Gait disorder   . Mild obesity   . GERD (gastroesophageal reflux disease)   . Osteopenia   . Chronic renal insufficiency   . Fibromyalgia   . Rheumatoid arthritis     Past Surgical History  Procedure Laterality Date  . Total knee arthroplasty Bilateral     right  . Abdominal hysterectomy    . Cholecystectomy    . Elbow surgery Left   . Hammer toe surgery Left   . Tonsillectomy    . Appendectomy      Family History  Problem Relation Age of Onset  . Anesthesia problems Neg Hx   . Hypertension Neg Hx   . Aortic  aneurysm Father   . Lung cancer Sister   . Lung cancer Brother   . Alzheimer's disease Brother   . Dementia Brother   . Parkinsonism Brother   . Bone cancer Sister     Social history:  reports that she has never smoked. She has never used smokeless tobacco. She reports that she does not drink alcohol or use illicit drugs.    Allergies  Allergen Reactions  . Feldene [Piroxicam]     Kidney issues  . Clinoril [Sulindac] Rash    Medications:  Current Outpatient Prescriptions on File Prior to Visit  Medication Sig Dispense Refill  . amLODipine (NORVASC) 5 MG tablet Take 5 mg by mouth every morning.      . benazepril (LOTENSIN) 20 MG tablet Take 20 mg by mouth every morning.      . Calcium Carb-Cholecalciferol (CALCIUM 500 +D PO) Take 1 tablet by mouth 2 (two) times daily. Calcium 500 mg & Vitamin D 1000 units      . diclofenac sodium (VOLTAREN) 1 % GEL Apply 1 application topically 2 (two) times daily as needed. For pain      . DULoxetine (CYMBALTA) 30 MG capsule Take 30 mg by mouth daily.      . fentaNYL (DURAGESIC - DOSED MCG/HR) 25 MCG/HR Place 1 patch onto the skin every  3 (three) days.      . ferrous sulfate 325 (65 FE) MG tablet Take 325 mg by mouth every evening.      . hydrochlorothiazide (HYDRODIURIL) 25 MG tablet Take 12.5 mg by mouth daily.       . Oxycodone HCl 10 MG TABS Take 10 mg by mouth 2 (two) times daily.       . propranolol (INDERAL) 10 MG tablet take 3 tablets by mouth twice a day  180 tablet  3   No current facility-administered medications on file prior to visit.    ROS:  Out of a complete 14 system review of symptoms, the patient complains only of the following symptoms, and all other reviewed systems are negative.  Fevers, chills, fatigue Chest pain, swelling in the legs Hearing loss, ringing in the ears, dizziness, difficulty swallowing Blurred vision, eye pain Cough, wheezing Confusion, headache, dizziness Muscle cramps  Runny nose Decreased  energy, and disinterest in activities  Blood pressure 121/59, pulse 58, weight 194 lb (87.998 kg).  Physical Exam  General: The patient is alert and cooperative at the time of the examination.  Skin: 2-3+ edema below the knees is noted bilaterally.   Neurologic Exam  Mental status: The patient is oriented x 3.  Cranial nerves: Facial symmetry is present. Speech is normal, no aphasia or dysarthria is noted. Extraocular movements are full. Visual fields are full.  Motor: The patient has good strength in all 4 extremities.  Sensory examination: Soft touch sensation is symmetric on the face and arms.  Coordination: The patient has good finger-nose-finger and heel-to-shin bilaterally.  Gait and station: The patient has a slightly wide-based, limping type gait. Tandem gait was not attempted. Gait is somewhat unsteady, the patient uses a walker for ambulation. Romberg is negative. No drift is seen.  Reflexes: Deep tendon reflexes are symmetric, but are depressed.   MRI cervical 07/03/2013:  IMPRESSION: Abnormal MRI scan of the cervical spine showing large broad-based central disc herniation at C3-4 resulting in mild cord compression and spinal stenosis. There also prominent spondylitic changes at C4-5 C5-6 with mild spinal stenosis as well.     CT head 11/19/2013:  IMPRESSION:  1. Large left parietal occipital scalp hematoma.  2. No acute intracranial or calvarial findings.  3. No evidence of acute cervical spine fracture, traumatic  subluxation or static signs of instability. Diffuse spondylosis.    Assessment/Plan:  1. Benign essential tremor  2. Rheumatoid arthritis  3. Gait disorder  4. Benign positional vertigo  5. Recent fall, post concussive syndrome  6. Cervical spondylosis, spinal stenosis  The patient will be sent for physical therapy for vestibular rehabilitation, and for neuromuscular therapy of the cervical spine and shoulders. The patient will  continue using her walker. The patient will followup through this office in about 4 months.  Dominique Alexanders MD 12/30/2013 8:46 PM  [facility] [address] [place], [place] [address]  Phone [phone] Fax [phone]

## 2014-01-04 ENCOUNTER — Telehealth: Payer: Self-pay | Admitting: Neurology

## 2014-01-04 NOTE — Telephone Encounter (Signed)
Patient calling about getting set up for physical therapy as she has not heard back from anyone. Please call to advise.

## 2014-01-04 NOTE — Telephone Encounter (Signed)
Patient is calling checking the status of the PT order.  Message sent to [facility].  Please advise.

## 2014-01-05 ENCOUNTER — Ambulatory Visit: Payer: Medicare HMO | Attending: Neurology | Admitting: Physical Therapy

## 2014-01-05 DIAGNOSIS — H8110 Benign paroxysmal vertigo, unspecified ear: Secondary | ICD-10-CM | POA: Insufficient documentation

## 2014-01-05 DIAGNOSIS — IMO0001 Reserved for inherently not codable concepts without codable children: Secondary | ICD-10-CM | POA: Insufficient documentation

## 2014-01-05 DIAGNOSIS — R269 Unspecified abnormalities of gait and mobility: Secondary | ICD-10-CM | POA: Insufficient documentation

## 2014-01-07 ENCOUNTER — Ambulatory Visit: Payer: Medicare HMO | Admitting: Physical Therapy

## 2014-01-11 ENCOUNTER — Ambulatory Visit: Payer: Medicare HMO | Admitting: Physical Therapy

## 2014-01-13 ENCOUNTER — Ambulatory Visit: Payer: Medicare HMO | Admitting: Rehabilitative and Restorative Service Providers"

## 2014-01-18 ENCOUNTER — Ambulatory Visit: Payer: Medicare HMO | Admitting: Physical Therapy

## 2014-01-20 ENCOUNTER — Ambulatory Visit: Payer: Medicare HMO | Admitting: Rehabilitative and Restorative Service Providers"

## 2014-01-25 ENCOUNTER — Ambulatory Visit: Payer: Medicare HMO | Admitting: Physical Therapy

## 2014-01-28 ENCOUNTER — Ambulatory Visit: Payer: Medicare HMO | Attending: Neurology | Admitting: Physical Therapy

## 2014-01-28 DIAGNOSIS — H8110 Benign paroxysmal vertigo, unspecified ear: Secondary | ICD-10-CM | POA: Insufficient documentation

## 2014-01-28 DIAGNOSIS — R269 Unspecified abnormalities of gait and mobility: Secondary | ICD-10-CM | POA: Insufficient documentation

## 2014-01-28 DIAGNOSIS — IMO0001 Reserved for inherently not codable concepts without codable children: Secondary | ICD-10-CM | POA: Insufficient documentation

## 2014-02-01 ENCOUNTER — Encounter: Payer: Medicare HMO | Admitting: Rehabilitative and Restorative Service Providers"

## 2014-02-03 ENCOUNTER — Ambulatory Visit: Payer: Medicare HMO | Admitting: Rehabilitative and Restorative Service Providers"

## 2014-02-05 ENCOUNTER — Other Ambulatory Visit: Payer: Self-pay | Admitting: Neurology

## 2014-02-08 ENCOUNTER — Ambulatory Visit: Payer: Medicare HMO | Admitting: Physical Therapy

## 2014-02-09 ENCOUNTER — Ambulatory Visit (INDEPENDENT_AMBULATORY_CARE_PROVIDER_SITE_OTHER): Payer: Commercial Managed Care - HMO | Admitting: Neurology

## 2014-02-09 ENCOUNTER — Encounter: Payer: Self-pay | Admitting: Neurology

## 2014-02-09 VITALS — BP 130/68 | HR 65 | Wt 192.0 lb

## 2014-02-09 DIAGNOSIS — G43009 Migraine without aura, not intractable, without status migrainosus: Secondary | ICD-10-CM

## 2014-02-09 DIAGNOSIS — R269 Unspecified abnormalities of gait and mobility: Secondary | ICD-10-CM

## 2014-02-09 DIAGNOSIS — G252 Other specified forms of tremor: Secondary | ICD-10-CM

## 2014-02-09 DIAGNOSIS — M47812 Spondylosis without myelopathy or radiculopathy, cervical region: Secondary | ICD-10-CM

## 2014-02-09 DIAGNOSIS — G250 Essential tremor: Secondary | ICD-10-CM

## 2014-02-09 DIAGNOSIS — H8110 Benign paroxysmal vertigo, unspecified ear: Secondary | ICD-10-CM

## 2014-02-09 NOTE — Progress Notes (Signed)
Reason for visit: Vertigo  Dominique Mckee is an 80 y.o. female  History of present illness:  Dominique Mckee is an 80 year old right-handed white female with a history of rheumatoid arthritis, and a history of a chronic gait disorder. The patient does have mild cord impingement at the C3-4 level without a definite myelopathy. The patient fell in January 2015, and she sustained a postconcussive syndrome, and her dizziness worsened. The patient has undergone physical therapy with vestibular therapy, which has helped her balance, and her vertigo. The patient indicates that she now has only brief vertigo lasting about 30 seconds when she lies down in bed. The patient no longer has vertigo when she is up and walking. The patient has not had any falls, and she just recently converted from a walker to a cane. The patient overall feels that her cognitive processing has improved, and her balance has improved. The patient returns to this office for further evaluation. The patient is doing vestibular therapy is on her own several times daily.  Past Medical History  Diagnosis Date  . Hypertension   . Arthritis   . Degenerative disc disease   . Benign essential tremor   . Migraine headache   . Cervicogenic headache   . Cervical spondylosis   . Lumbar spondylosis   . Gait disorder   . Mild obesity   . GERD (gastroesophageal reflux disease)   . Osteopenia   . Chronic renal insufficiency   . Fibromyalgia   . Rheumatoid arthritis     Past Surgical History  Procedure Laterality Date  . Total knee arthroplasty Bilateral     right  . Abdominal hysterectomy    . Cholecystectomy    . Elbow surgery Left   . Hammer toe surgery Left   . Tonsillectomy    . Appendectomy      Family History  Problem Relation Age of Onset  . Anesthesia problems Neg Hx   . Hypertension Neg Hx   . Aortic aneurysm Father   . Lung cancer Sister   . Lung cancer Brother   . Alzheimer's disease Brother   . Dementia  Brother   . Parkinsonism Brother   . Bone cancer Sister     Social history:  reports that she has never smoked. She has never used smokeless tobacco. She reports that she does not drink alcohol or use illicit drugs.    Allergies  Allergen Reactions  . Feldene [Piroxicam]     Kidney issues  . Clinoril [Sulindac] Rash    Medications:  Current Outpatient Prescriptions on File Prior to Visit  Medication Sig Dispense Refill  . amLODipine (NORVASC) 5 MG tablet Take 5 mg by mouth every morning.      . benazepril (LOTENSIN) 20 MG tablet Take 20 mg by mouth every morning.      . Calcium Carb-Cholecalciferol (CALCIUM 500 +D PO) Take 1 tablet by mouth 2 (two) times daily. Calcium 500 mg & Vitamin D 1000 units      . diclofenac sodium (VOLTAREN) 1 % GEL Apply 1 application topically 2 (two) times daily as needed. For pain      . DULoxetine (CYMBALTA) 30 MG capsule Take 30 mg by mouth daily.      . fentaNYL (DURAGESIC - DOSED MCG/HR) 25 MCG/HR Place 1 patch onto the skin every 3 (three) days.      . ferrous sulfate 325 (65 FE) MG tablet Take 325 mg by mouth every evening.      Marland Kitchen  folic acid (FOLVITE) 1 MG tablet Take 2 mg by mouth daily.       . hydrochlorothiazide (HYDRODIURIL) 25 MG tablet Take 12.5 mg by mouth daily.       Marland Kitchen ketoconazole (NIZORAL) 2 % cream Apply 2 g topically daily.      . Methotrexate, PF, 10 MG/0.4ML SOAJ Inject 15 mg into the skin once a week.      . Oxycodone HCl 10 MG TABS Take 10 mg by mouth 2 (two) times daily.       . pantoprazole (PROTONIX) 40 MG tablet Take 40 mg by mouth daily.      . propranolol (INDERAL) 10 MG tablet take 3 tablets by mouth twice a day  180 tablet  6   No current facility-administered medications on file prior to visit.    ROS:  Out of a complete 14 system review of symptoms, the patient complains only of the following symptoms, and all other reviewed systems are negative.  Excessive sweating Ringing in the ears, runny nose Eye redness,  blurred vision Flushing Frequent waking Joint pain, joint swelling, walking difficulty, neck pain, neck stiffness Memory loss, dizziness, headache, speech problems, weakness Confusion, depression  Blood pressure 130/68, pulse 65, weight 192 lb (87.091 kg).  Physical Exam  General: The patient is alert and cooperative at the time of the examination. The patient is moderately obese.  Skin: No significant peripheral edema is noted.   Neurologic Exam  Mental status: The patient is oriented x 3. Mini-Mental status examination done today shows a total score of 30/30.  Cranial nerves: Facial symmetry is present. Speech is normal, no aphasia or dysarthria is noted. Extraocular movements are full. Some divergence of gaze is noted with superior gaze, with exotropia of the right eye. Visual fields are full.  Motor: The patient has good strength in all 4 extremities.  Sensory examination: Soft touch sensation is symmetric on the face, arms, and legs.  Coordination: The patient has good finger-nose-finger and heel-to-shin bilaterally. The patient has some apraxia with the use of the lower extremities.  Gait and station: The patient has a slightly wide-based, unsteady gait. The patient uses a cane for ambulation. Tandem gait was not attempted. Romberg is negative. No drift is seen.  Reflexes: Deep tendon reflexes are symmetric, but are depressed. The patient is using bilateral AFO braces.   Assessment/Plan:  1. Rheumatoid arthritis  2. Gait disorder, bilateral foot drops  3. Positional vertigo  4. Concussive syndrome, resolving  Overall, the patient is doing much better. The patient will continue vestibular therapy. The patient will followup through this office in 6-8 months. The patient will contact me if she has any further issues.  Jill Alexanders MD 02/09/2014 8:10 PM  [facility] [address] [place], [place] [address]  Phone [phone]  Fax [phone]

## 2014-02-09 NOTE — Patient Instructions (Signed)

## 2014-02-10 ENCOUNTER — Ambulatory Visit: Payer: Medicare HMO | Admitting: Rehabilitative and Restorative Service Providers"

## 2014-02-15 ENCOUNTER — Ambulatory Visit: Payer: Medicare HMO | Admitting: Physical Therapy

## 2014-02-18 ENCOUNTER — Encounter: Payer: Medicare HMO | Admitting: Physical Therapy

## 2014-02-22 ENCOUNTER — Ambulatory Visit: Payer: Medicare HMO | Admitting: Physical Therapy

## 2014-02-24 ENCOUNTER — Ambulatory Visit: Payer: Medicare HMO | Admitting: Rehabilitative and Restorative Service Providers"

## 2014-03-01 ENCOUNTER — Encounter: Payer: Medicare HMO | Admitting: Physical Therapy

## 2014-03-04 ENCOUNTER — Ambulatory Visit: Payer: Medicare HMO | Attending: Neurology | Admitting: Physical Therapy

## 2014-03-04 DIAGNOSIS — R269 Unspecified abnormalities of gait and mobility: Secondary | ICD-10-CM | POA: Diagnosis not present

## 2014-03-04 DIAGNOSIS — H8110 Benign paroxysmal vertigo, unspecified ear: Secondary | ICD-10-CM | POA: Insufficient documentation

## 2014-03-04 DIAGNOSIS — IMO0001 Reserved for inherently not codable concepts without codable children: Secondary | ICD-10-CM | POA: Diagnosis not present

## 2014-03-09 ENCOUNTER — Ambulatory Visit: Payer: Medicare HMO | Admitting: Physical Therapy

## 2014-03-09 DIAGNOSIS — IMO0001 Reserved for inherently not codable concepts without codable children: Secondary | ICD-10-CM | POA: Diagnosis not present

## 2014-03-11 ENCOUNTER — Encounter: Payer: Medicare HMO | Admitting: Physical Therapy

## 2014-03-16 ENCOUNTER — Ambulatory Visit: Payer: Medicare HMO | Admitting: Physical Therapy

## 2014-03-18 ENCOUNTER — Encounter: Payer: Medicare HMO | Admitting: Physical Therapy

## 2014-03-23 ENCOUNTER — Encounter: Payer: Medicare HMO | Admitting: Physical Therapy

## 2014-03-25 ENCOUNTER — Encounter: Payer: Medicare HMO | Admitting: Physical Therapy

## 2014-03-30 ENCOUNTER — Ambulatory Visit: Payer: Medicare HMO | Attending: Neurology | Admitting: Physical Therapy

## 2014-03-30 DIAGNOSIS — H8110 Benign paroxysmal vertigo, unspecified ear: Secondary | ICD-10-CM | POA: Insufficient documentation

## 2014-03-30 DIAGNOSIS — R269 Unspecified abnormalities of gait and mobility: Secondary | ICD-10-CM | POA: Insufficient documentation

## 2014-03-30 DIAGNOSIS — IMO0001 Reserved for inherently not codable concepts without codable children: Secondary | ICD-10-CM | POA: Insufficient documentation

## 2014-04-01 ENCOUNTER — Ambulatory Visit: Payer: Medicare HMO | Admitting: Physical Therapy

## 2014-04-06 ENCOUNTER — Ambulatory Visit: Payer: Medicare HMO | Admitting: Physical Therapy

## 2014-04-09 ENCOUNTER — Ambulatory Visit: Payer: Medicare HMO | Admitting: Rehabilitative and Restorative Service Providers"

## 2014-04-13 ENCOUNTER — Ambulatory Visit: Payer: Medicare HMO | Admitting: Physical Therapy

## 2014-04-15 ENCOUNTER — Ambulatory Visit: Payer: Medicare HMO | Admitting: Rehabilitative and Restorative Service Providers"

## 2014-04-20 ENCOUNTER — Ambulatory Visit: Payer: Medicare HMO | Admitting: Physical Therapy

## 2014-04-23 ENCOUNTER — Ambulatory Visit: Payer: Medicare HMO | Admitting: Rehabilitative and Restorative Service Providers"

## 2014-05-03 ENCOUNTER — Ambulatory Visit: Payer: Medicare HMO | Admitting: Physical Therapy

## 2014-06-09 ENCOUNTER — Other Ambulatory Visit: Payer: Self-pay

## 2014-06-09 DIAGNOSIS — Z1231 Encounter for screening mammogram for malignant neoplasm of breast: Secondary | ICD-10-CM

## 2014-06-15 ENCOUNTER — Telehealth: Payer: Self-pay | Admitting: *Deleted

## 2014-06-15 DIAGNOSIS — R269 Unspecified abnormalities of gait and mobility: Secondary | ICD-10-CM

## 2014-06-15 NOTE — Telephone Encounter (Signed)
Pt's information was sent to [facility], CMA, Dr. Jannifer Franklin assistant. Please advise

## 2014-06-15 NOTE — Telephone Encounter (Signed)
Dominique Mckee is requesting additional physical therapy. She did completed 17 visits back in March. Please enter new order.

## 2014-06-15 NOTE — Telephone Encounter (Signed)
I called the patient. The patient never completed the physical therapy that was ordered previously for her balance issues. She did also undergo some vestibular rehabilitation. I'll send her back for balance training.

## 2014-06-17 ENCOUNTER — Ambulatory Visit: Payer: Medicare HMO

## 2014-06-23 ENCOUNTER — Ambulatory Visit: Payer: Commercial Managed Care - HMO | Admitting: Neurology

## 2014-06-24 ENCOUNTER — Ambulatory Visit: Payer: Commercial Managed Care - HMO | Admitting: Neurology

## 2014-07-02 ENCOUNTER — Ambulatory Visit: Payer: Medicare HMO | Admitting: Rehabilitative and Restorative Service Providers"

## 2014-07-06 ENCOUNTER — Ambulatory Visit: Payer: Medicare HMO | Attending: Neurology | Admitting: Physical Therapy

## 2014-07-06 DIAGNOSIS — H8110 Benign paroxysmal vertigo, unspecified ear: Secondary | ICD-10-CM | POA: Insufficient documentation

## 2014-07-06 DIAGNOSIS — IMO0001 Reserved for inherently not codable concepts without codable children: Secondary | ICD-10-CM | POA: Insufficient documentation

## 2014-07-06 DIAGNOSIS — R269 Unspecified abnormalities of gait and mobility: Secondary | ICD-10-CM | POA: Insufficient documentation

## 2014-07-09 ENCOUNTER — Encounter: Payer: Medicare HMO | Admitting: Rehabilitative and Restorative Service Providers"

## 2014-07-13 ENCOUNTER — Ambulatory Visit: Payer: Medicare HMO | Admitting: Physical Therapy

## 2014-07-16 ENCOUNTER — Encounter: Payer: Medicare HMO | Admitting: Rehabilitative and Restorative Service Providers"

## 2014-08-16 ENCOUNTER — Ambulatory Visit (INDEPENDENT_AMBULATORY_CARE_PROVIDER_SITE_OTHER): Payer: Commercial Managed Care - HMO | Admitting: Neurology

## 2014-08-16 ENCOUNTER — Encounter: Payer: Self-pay | Admitting: Neurology

## 2014-08-16 ENCOUNTER — Encounter (INDEPENDENT_AMBULATORY_CARE_PROVIDER_SITE_OTHER): Payer: Self-pay

## 2014-08-16 VITALS — BP 130/68 | HR 57 | Ht 62.0 in | Wt 189.6 lb

## 2014-08-16 DIAGNOSIS — S060X0S Concussion without loss of consciousness, sequela: Secondary | ICD-10-CM

## 2014-08-16 DIAGNOSIS — E538 Deficiency of other specified B group vitamins: Secondary | ICD-10-CM

## 2014-08-16 DIAGNOSIS — G252 Other specified forms of tremor: Secondary | ICD-10-CM

## 2014-08-16 DIAGNOSIS — R251 Tremor, unspecified: Secondary | ICD-10-CM

## 2014-08-16 DIAGNOSIS — R5381 Other malaise: Secondary | ICD-10-CM

## 2014-08-16 DIAGNOSIS — G250 Essential tremor: Secondary | ICD-10-CM

## 2014-08-16 DIAGNOSIS — R269 Unspecified abnormalities of gait and mobility: Secondary | ICD-10-CM

## 2014-08-16 MED ORDER — PROPRANOLOL HCL 40 MG PO TABS: 40.0000 mg | ORAL_TABLET | Freq: Two times a day (BID) | ORAL | Status: DC

## 2014-08-16 NOTE — Progress Notes (Signed)
Reason for visit: Tremor  Dominique Mckee is an 80 y.o. female  History of present illness:  Dominique Mckee is an 80 year old right-handed white female with a history of an essential tremor. The patient has been on propranolol taking 30 mg twice daily with some benefit, but she believes that her tremor has worsened recently. The patient indicates that she has difficulty feeding herself and perform handwriting. The patient continues to have gait instability, and she has fallen on 2 occasions since last seen. The patient has undergone physical therapy for balance training and for vestibular therapy. The patient is still having some vertigo, but this has improved. She will have episodes of vertigo with lying down, particularly if she rolls to the right side. The patient uses a walker for ambulation, but she has a split-level house, and the last fall occurred when entering the kitchen and she missed her step going up to the level of the kitchen. The patient has had MRI evaluation the brain that shows evidence of a moderate level small vessel disease. She also has spinal stenosis without significant cord compression. The patient denies any issues controlling the bowels or the bladder. She has numbness in the feet, bilateral AFO braces. The patient does report some issues with memory that she indicates has been present since she fell and sustained a concussion. The patient has improved some, but not normalized.  Past Medical History  Diagnosis Date  . Hypertension   . Arthritis   . Degenerative disc disease   . Benign essential tremor   . Migraine headache   . Cervicogenic headache   . Cervical spondylosis   . Lumbar spondylosis   . Gait disorder   . Mild obesity   . GERD (gastroesophageal reflux disease)   . Osteopenia   . Chronic renal insufficiency   . Fibromyalgia   . Rheumatoid arthritis     Past Surgical History  Procedure Laterality Date  . Total knee arthroplasty Bilateral     right    . Abdominal hysterectomy    . Cholecystectomy    . Elbow surgery Left   . Hammer toe surgery Left   . Tonsillectomy    . Appendectomy      Family History  Problem Relation Age of Onset  . Anesthesia problems Neg Hx   . Hypertension Neg Hx   . Aortic aneurysm Father   . Lung cancer Sister   . Lung cancer Brother   . Alzheimer's disease Brother   . Dementia Brother   . Parkinsonism Brother   . Bone cancer Sister     Social history:  reports that she has never smoked. She has never used smokeless tobacco. She reports that she does not drink alcohol or use illicit drugs.    Allergies  Allergen Reactions  . Feldene [Piroxicam]     Kidney issues  . Clinoril [Sulindac] Rash    Medications:  Current Outpatient Prescriptions on File Prior to Visit  Medication Sig Dispense Refill  . amLODipine (NORVASC) 5 MG tablet Take 5 mg by mouth every morning.      . benazepril (LOTENSIN) 20 MG tablet Take 20 mg by mouth every morning.      . Calcium Carb-Cholecalciferol (CALCIUM 500 +D PO) Take 1 tablet by mouth 2 (two) times daily. Calcium 500 mg & Vitamin D 1000 units      . diclofenac sodium (VOLTAREN) 1 % GEL Apply 1 application topically 2 (two) times daily as needed. For pain      .  DULoxetine (CYMBALTA) 30 MG capsule Take 30 mg by mouth daily.      . fentaNYL (DURAGESIC - DOSED MCG/HR) 25 MCG/HR Place 1 patch onto the skin every 3 (three) days.      . ferrous sulfate 325 (65 FE) MG tablet Take 325 mg by mouth every evening.      . folic acid (FOLVITE) 1 MG tablet Take 2 mg by mouth daily.       . hydrochlorothiazide (HYDRODIURIL) 25 MG tablet Take 12.5 mg by mouth daily.       Marland Kitchen ketoconazole (NIZORAL) 2 % cream Apply 2 g topically daily.      . Methotrexate, PF, 10 MG/0.4ML SOAJ Inject 15 mg into the skin once a week.      . Oxycodone HCl 10 MG TABS Take 10 mg by mouth 2 (two) times daily.       . pantoprazole (PROTONIX) 40 MG tablet Take 40 mg by mouth daily.       No current  facility-administered medications on file prior to visit.    ROS:  Out of a complete 14 system review of symptoms, the patient complains only of the following symptoms, and all other reviewed systems are negative.  Fatigue Runny nose Eye discharge, eye redness, eye pain, blurred vision Leg swelling Flushing Joint pain, joint swelling, back pain, muscle cramps, walking difficulties, neck pain Dizziness, tremors Memory disturbance  Blood pressure 130/68, pulse 57, height 5\' 2"  (1.575 m), weight 189 lb 9.6 oz (86.002 kg).  Physical Exam  General: The patient is alert and cooperative at the time of the examination.  Skin: No significant peripheral edema is noted.   Neurologic Exam  Mental status: The patient is oriented x 3. Mini-Mental status examination done today shows a total score of 30/30.  Cranial nerves: Facial symmetry is present. Speech is normal, no aphasia or dysarthria is noted. Extraocular movements are full. Visual fields are full.  Motor: The patient has good strength in all 4 extremities, but the patient has bilateral AFO braces.  Sensory examination: There is decreased pinprick sensation to the knees bilaterally.  Coordination: The patient has good finger-nose-finger and heel-to-shin bilaterally.  Gait and station: The patient has a wide based gait, she walks with a walker. Tandem gait was not tested. Romberg is negative, but is unsteady. No drift is seen.  Reflexes: Deep tendon reflexes are symmetric, but are depressed.   Assessment/Plan:  1. Gait disorder  2. Essential tremor  3. Reported memory disturbance  4. Rheumatoid arthritis  5. History of concussion, post concussive vertigo  The patient likely has a multifactorial gait disorder associated with small vessel disease, right knee replacement, neuropathy, and the history of an essential tremor. The patient has gained benefit from physical therapy. The patient will continue walking with a  walker, and to maintain safety with ambulation. She has reported an increase in her essential tremor, we will go to 40 mg twice daily of the propranolol. The patient will followup in 4 or 5 months. The patient may benefit from another session of physical therapy in the future.  Jill Alexanders MD 08/16/2014 7:42 PM  [facility] [address] [place], [place] [address]  Phone [phone] Fax [phone]

## 2014-08-16 NOTE — Patient Instructions (Signed)

## 2014-08-17 LAB — RPR: RPR: NONREACTIVE

## 2014-08-17 LAB — TSH: TSH: 1.38 u[IU]/mL (ref 0.450–4.500)

## 2014-08-17 LAB — VITAMIN B12: Vitamin B-12: 502 pg/mL (ref 211–946)

## 2014-08-17 NOTE — Progress Notes (Signed)
Quick Note:  Spoke to patient and relayed blood work results as unremarkable, per Dr. Jannifer Franklin. ______

## 2014-08-20 ENCOUNTER — Ambulatory Visit: Payer: Medicare HMO

## 2014-09-09 ENCOUNTER — Ambulatory Visit: Payer: Medicare HMO

## 2014-09-15 ENCOUNTER — Encounter: Payer: Self-pay | Admitting: Neurology

## 2014-09-21 ENCOUNTER — Encounter: Payer: Self-pay | Admitting: Neurology

## 2014-11-09 ENCOUNTER — Ambulatory Visit: Payer: Medicare HMO

## 2014-12-06 ENCOUNTER — Ambulatory Visit: Payer: Medicare HMO

## 2014-12-30 ENCOUNTER — Ambulatory Visit: Payer: Medicare HMO

## 2015-01-04 ENCOUNTER — Ambulatory Visit: Payer: PPO

## 2015-01-11 ENCOUNTER — Encounter: Payer: Self-pay | Admitting: Physical Therapy

## 2015-01-11 ENCOUNTER — Ambulatory Visit: Payer: PPO | Attending: Internal Medicine | Admitting: Physical Therapy

## 2015-01-11 DIAGNOSIS — H8111 Benign paroxysmal vertigo, right ear: Secondary | ICD-10-CM

## 2015-01-11 DIAGNOSIS — R269 Unspecified abnormalities of gait and mobility: Secondary | ICD-10-CM | POA: Insufficient documentation

## 2015-01-12 ENCOUNTER — Encounter: Payer: Self-pay | Admitting: Physical Therapy

## 2015-01-12 ENCOUNTER — Ambulatory Visit: Payer: PPO | Admitting: Physical Therapy

## 2015-01-12 DIAGNOSIS — R269 Unspecified abnormalities of gait and mobility: Secondary | ICD-10-CM

## 2015-01-12 DIAGNOSIS — H8111 Benign paroxysmal vertigo, right ear: Secondary | ICD-10-CM | POA: Diagnosis not present

## 2015-01-12 NOTE — Patient Instructions (Signed)
Brandt Daroff exercises

## 2015-01-12 NOTE — Therapy (Signed)
[facility] [address] [place], [place], [address] Phone: [phone]   Fax:  [phone]  Physical Therapy Evaluation  Patient Details  Name: Dominique Mckee MRN: [id number] Date of Birth: Oct 18, 1930 Referring Provider:  Dorian Heckle, MD  Encounter Date: 01/11/2015      PT End of Session - 01/12/15 1743    Visit Number 1  G1   Number of Visits 17   Date for PT Re-Evaluation 03/12/15   Authorization Type [employer] Medicare   PT Start Time 1315   PT Stop Time 1402   PT Time Calculation (min) 47 min      Past Medical History  Diagnosis Date  . Hypertension   . Arthritis   . Degenerative disc disease   . Benign essential tremor   . Migraine headache   . Cervicogenic headache   . Cervical spondylosis   . Lumbar spondylosis   . Gait disorder   . Mild obesity   . GERD (gastroesophageal reflux disease)   . Osteopenia   . Chronic renal insufficiency   . Fibromyalgia   . Rheumatoid arthritis     Past Surgical History  Procedure Laterality Date  . Total knee arthroplasty Bilateral     right  . Abdominal hysterectomy    . Cholecystectomy    . Elbow surgery Left   . Hammer toe surgery Left   . Tonsillectomy    . Appendectomy      There were no vitals filed for this visit.  Visit Diagnosis:  BPPV (benign paroxysmal positional vertigo), right - Plan: PT plan of care cert/re-cert  Abnormality of gait - Plan: PT plan of care cert/re-cert      Subjective Assessment - 01/11/15 1337    Symptoms Pt. reports vertigo occurs when she lies straight back; says head feels funny all the time; looking up causes vertigo; pt. states she started using RW last year when she began feeling unstable   Patient Stated Goals resolve the vertigo and improve balance; would like to try to get back to using cane             [facility] PT Assessment - 01/12/15 0001    Assessment   Medical Diagnosis Vertigo; Gait abnormality    Next MD Visit 02-15-15   Balance Screen   Has the patient fallen in the past 6 months Yes   How many times? 1   Has the patient had a decrease in activity level because of a fear of falling?  Yes   Is the patient reluctant to leave their home because of a fear of falling?  Yes   [place] Private residence   Home Access Stairs to enter   Entrance Stairs-Number of Steps 2   [place] One level  1 step to get into kitchen   Prior Function   Level of Independence Independent with homemaking with ambulation       Pt. Has positive R Dix-Hallpike test with nystagmus and vertigo; treated with Epley maneuver x 1 rep due to time Constraint; instructed pt to do Brandt-Daroff exercises at home for habituation      Pt. Using RW for assist. With ambulation with SBA needed for safety                PT Education - 01/12/15 1743    Education provided Yes   Education Details Brandt-Daroff   Person(s) Educated Patient   Methods Explanation;Demonstration;Handout   Comprehension  Verbalized understanding;Returned demonstration          PT Short Term Goals - 01/12/15 1749    PT SHORT TERM GOAL #1   Title Pt. will have a negative R DIx-Hallpike test without nystagmus and vertigo to indicate that R BPPV has resolved.   Baseline 02-10-15   Time 4   Period Weeks   Status New   PT SHORT TERM GOAL #2   Title Assess TUG with RW when BPPV has resolved and set goal as appropriate.   Baseline 02-10-15   Time 4   Period Weeks   Status New   PT SHORT TERM GOAL #3   Title Pt. will amb. 5" nonstop with RW on flat even surface with SBA   Baseline 02-10-15   Time 4   Period Weeks   Status New   PT SHORT TERM GOAL #4   Title Independent in HEP for balance exercises.   Baseline 02-10-15   Time 4   Period Weeks   Status New   PT SHORT TERM GOAL #5   Title Perform sit to stand with 1 UE support from mat to demo incr. LE strength   Baseline 02-10-15   Time 4    Period Weeks   Status New           PT Long Term Goals - 01/12/15 1759    PT LONG TERM GOAL #1   Title Pt. will improve TUG score by a decrease of at least 5 secs from initial score with use of RW.   Baseline 03-12-15   Time 8   Period Weeks   Status New   PT LONG TERM GOAL #2   Title Pt will incr. gait velocity by at least .8 secs from initial score for incr. gait efficiency.   Baseline 03-12-15   Time 8   Period Weeks   Status New   PT LONG TERM GOAL #3   Title Amb. 38' with single point cane modified independent for household amb.   Baseline 03-12-15   Time 8   Period Weeks   Status New   PT LONG TERM GOAL #4   Title Pt. will report at least 50% improvement in balance and LE strength.   Baseline 03-12-15   Time 8   Period Weeks   Status New               Plan - 01/12/15 1745    Clinical Impression Statement Pt. presents wtih R BPPV and moderate deconditioning with LE weakness due to decr. mobility within past 6 months - pt. has had vertigo and decr. movement and activity due to fear of falling   Pt will benefit from skilled therapeutic intervention in order to improve on the following deficits Abnormal gait;Decreased activity tolerance;Pain;Decreased mobility;Decreased balance;Decreased strength;Other (comment)  vertigo   Rehab Potential Good   PT Frequency 2x / week   PT Duration 8 weeks   PT Treatment/Interventions ADLs/Self Care Home Management;Therapeutic activities;Patient/family education;Therapeutic exercise;Gait training;Balance training;Neuromuscular re-education;Functional mobility training;Other (comment)  canalith repositioning maneuver   PT Next Visit Plan epley's for R BPPV if needed; begin HEP for balance and strengthening   PT Home Exercise Plan Brandt-Daroff; balance and strengthening   Consulted and Agree with Plan of Care Patient          G-Codes - 01/18/15 1804    Functional Assessment Tool Used clinical judgment - (+) R Dix-Hallpike  test   Functional Limitation Other PT primary  c/o vertigo with  bed mobility and with amb.   Other PT Primary Current Status [phone]) At least 40 percent but less than 60 percent impaired, limited or restricted   Other PT Primary Goal Status (A2130) At least 20 percent but less than 40 percent impaired, limited or restricted       Problem List Patient Active Problem List   Diagnosis Date Noted  . Concussion with no loss of consciousness 12/30/2013  . Cervical spondylosis without myelopathy 01/01/2013  . Migraine without aura, without mention of intractable migraine without mention of status migrainosus 01/01/2013  . Abnormality of gait 01/01/2013  . Essential and other specified forms of tremor 01/01/2013    Alda Lea, PT 01/12/2015, 6:10 PM  [facility] [address] [place], [place], [address] Phone: [phone]   Fax:  [phone]

## 2015-01-13 ENCOUNTER — Encounter: Payer: Self-pay | Admitting: Physical Therapy

## 2015-01-13 NOTE — Therapy (Signed)
[facility] [address] [place], [place], [address] Phone: [phone]   Fax:  [phone]  Physical Therapy Treatment  Patient Details  Name: Dominique Mckee MRN: [id number] Date of Birth: 04/30/1930 Referring Provider:  Dorian Heckle, MD  Encounter Date: 01/12/2015      PT End of Session - 01/13/15 1724    Visit Number 2  G2   Number of Visits 17   Date for PT Re-Evaluation 03/12/15   Authorization Type [employer] Medicare   Authorization Time Period 01-11-15 - 03-12-15   PT Start Time 1236   PT Stop Time 1323   PT Time Calculation (min) 47 min      Past Medical History  Diagnosis Date  . Hypertension   . Arthritis   . Degenerative disc disease   . Benign essential tremor   . Migraine headache   . Cervicogenic headache   . Cervical spondylosis   . Lumbar spondylosis   . Gait disorder   . Mild obesity   . GERD (gastroesophageal reflux disease)   . Osteopenia   . Chronic renal insufficiency   . Fibromyalgia   . Rheumatoid arthritis     Past Surgical History  Procedure Laterality Date  . Total knee arthroplasty Bilateral     right  . Abdominal hysterectomy    . Cholecystectomy    . Elbow surgery Left   . Hammer toe surgery Left   . Tonsillectomy    . Appendectomy      There were no vitals filed for this visit.  Visit Diagnosis:  Abnormality of gait  BPPV (benign paroxysmal positional vertigo), right      Subjective Assessment - 01/13/15 1720    Symptoms Pt. states she has not had any vertigo since the treatment (Epley's) on Tuesday; states "I feel like my head is so much clearer"   Pertinent History h/o BPPV last spring - had OP PT at this facility; RA   Currently in Pain? Yes   Pain Score 5    Pain Location Shoulder   Pain Orientation Left   Pain Descriptors / Indicators Discomfort;Headache;Aching;Sore   Pain Type Chronic pain   Pain Onset More than a month ago   Pain Frequency  Intermittent   Aggravating Factors  sitting for prolonged periods of time   Pain Relieving Factors injection helps   Multiple Pain Sites No     NeuroRe-ed: pt. Has (-) R Dix-Hallpike test with no nystagmus and no c/o vertigo in test position; indicative that R BPPV has Resolved; no vertigo reported with supine to sit tranfer  Pt. Performed standing balance exercises inside bars with UE support prn including forward, back and side kicks x10 reps each; Marching in place x 10 reps each leg; marching up and back x 2 reps insdie bars; stepping over and back of balance beam with UE support With CGA  TherEx:  Sit to stand with 1 UE support today - 5 reps; heel raises x 10 reps; ankle sways x 10 reps; 3# weight used on R and LLE fro hip fleixon, ext., abduction x 10 reps each Scifit Level 1.5 x 5" with UE's and LE's                          PT Education - 01/13/15 1724    Education provided Yes   Education Details reviewed balance exercises in standing as previously instructed in previous admission   Person(s) Educated  Patient   Methods Explanation;Demonstration   Comprehension Verbalized understanding;Returned demonstration          PT Short Term Goals - 01/12/15 1749    PT SHORT TERM GOAL #1   Title Pt. will have a negative R DIx-Hallpike test without nystagmus and vertigo to indicate that R BPPV has resolved.   Baseline 02-10-15   Time 4   Period Weeks   Status New   PT SHORT TERM GOAL #2   Title Assess TUG with RW when BPPV has resolved and set goal as appropriate.   Baseline 02-10-15   Time 4   Period Weeks   Status New   PT SHORT TERM GOAL #3   Title Pt. will amb. 5" nonstop with RW on flat even surface with SBA   Baseline 02-10-15   Time 4   Period Weeks   Status New   PT SHORT TERM GOAL #4   Title Independent in HEP for balance exercises.   Baseline 02-10-15   Time 4   Period Weeks   Status New   PT SHORT TERM GOAL #5   Title Perform sit to  stand with 1 UE support from mat to demo incr. LE strength   Baseline 02-10-15   Time 4   Period Weeks   Status New           PT Long Term Goals - 01/12/15 1759    PT LONG TERM GOAL #1   Title Pt. will improve TUG score by a decrease of at least 5 secs from initial score with use of RW.   Baseline 03-12-15   Time 8   Period Weeks   Status New   PT LONG TERM GOAL #2   Title Pt will incr. gait velocity by at least .8 secs from initial score for incr. gait efficiency.   Baseline 03-12-15   Time 8   Period Weeks   Status New   PT LONG TERM GOAL #3   Title Amb. 73' with single point cane modified independent for household amb.   Baseline 03-12-15   Time 8   Period Weeks   Status New   PT LONG TERM GOAL #4   Title Pt. will report at least 50% improvement in balance and LE strength.   Baseline 03-12-15   Time 8   Period Weeks   Status New               Plan - 01/13/15 1725    Clinical Impression Statement Pt.'s R BPPV appears to be resolved - no nystagmus and no c/o vertigo reported today- pt. has hip weakness as evidenced by difficulty iwth sit to stand   Pt will benefit from skilled therapeutic intervention in order to improve on the following deficits Abnormal gait;Decreased activity tolerance;Pain;Decreased mobility;Decreased balance;Decreased strength;Other (comment)   Rehab Potential Good   PT Frequency 2x / week   PT Duration 8 weeks   PT Treatment/Interventions ADLs/Self Care Home Management;Therapeutic activities;Patient/family education;Therapeutic exercise;Gait training;Balance training;Neuromuscular re-education;Functional mobility training;Other (comment)   PT Next Visit Plan cont  with balance and strengthening   PT Home Exercise Plan Brandt-Daroff; balance and strengthening   Consulted and Agree with Plan of Care Patient        Problem List Patient Active Problem List   Diagnosis Date Noted  . Concussion with no loss of consciousness 12/30/2013  .  Cervical spondylosis without myelopathy 01/01/2013  . Migraine without aura, without mention of intractable migraine without mention of status  migrainosus 01/01/2013  . Abnormality of gait 01/01/2013  . Essential and other specified forms of tremor 01/01/2013    Alda Lea, PT 01/13/2015, 5:28 PM  [facility] [address] [place], [place], [address] Phone: [phone]   Fax:  [phone]

## 2015-01-20 ENCOUNTER — Ambulatory Visit: Payer: PPO | Admitting: Physical Therapy

## 2015-02-01 ENCOUNTER — Ambulatory Visit: Payer: PPO | Attending: Internal Medicine | Admitting: Physical Therapy

## 2015-02-01 DIAGNOSIS — H8111 Benign paroxysmal vertigo, right ear: Secondary | ICD-10-CM | POA: Diagnosis not present

## 2015-02-01 DIAGNOSIS — R269 Unspecified abnormalities of gait and mobility: Secondary | ICD-10-CM | POA: Insufficient documentation

## 2015-02-02 ENCOUNTER — Encounter: Payer: Self-pay | Admitting: Physical Therapy

## 2015-02-02 NOTE — Therapy (Signed)
[facility] [address] [place], [place], [address] Phone: [phone]   Fax:  [phone]  Physical Therapy Treatment  Patient Details  Name: Dominique Mckee MRN: [id number] Date of Birth: 1930-03-05 Referring Provider:  Dorian Heckle, MD  Encounter Date: 02/01/2015      PT End of Session - 02/02/15 1059    Visit Number 3  G3   Number of Visits 17   Date for PT Re-Evaluation 03/12/15   Authorization Type [employer] Medicare   Authorization Time Period 01-11-15 - 03-12-15   PT Start Time 1149   PT Stop Time 1236   PT Time Calculation (min) 47 min      Past Medical History  Diagnosis Date  . Hypertension   . Arthritis   . Degenerative disc disease   . Benign essential tremor   . Migraine headache   . Cervicogenic headache   . Cervical spondylosis   . Lumbar spondylosis   . Gait disorder   . Mild obesity   . GERD (gastroesophageal reflux disease)   . Osteopenia   . Chronic renal insufficiency   . Fibromyalgia   . Rheumatoid arthritis     Past Surgical History  Procedure Laterality Date  . Total knee arthroplasty Bilateral     right  . Abdominal hysterectomy    . Cholecystectomy    . Elbow surgery Left   . Hammer toe surgery Left   . Tonsillectomy    . Appendectomy      There were no vitals filed for this visit.  Visit Diagnosis:  Abnormality of gait      Subjective Assessment - 02/02/15 1058    Subjective Pt. reports no vertigo at this time; states she feels really stiff today - has not been walking alot at home   Pertinent History h/o BPPV last spring - had OP PT at this facility; RA   Patient Stated Goals resolve the vertigo and improve balance; would like to try to get back to using cane    Currently in Pain? No/denies     TherEx; 2# weight used for bil. Hip extension strengthening exercise in standing x 10 reps each leg; 2# for L hip abduction in Standing x 10 reps ; no weight RLE hip  abduction; 2# used for RLE and LLE hip flexion x 10 reps each; bil. Heel cord stretch In standing with 2" block x 30 sec hold x 2 reps RLE and LLE Sit to stand x 5 reps with UE support prn; marching in place with 2# weight on each leg for hip strengthening x 10 reps each  Neuro Re-ed: practice turning from R to L - with cues to lift feet and turn rather than rotate Single limb stance activities inside bars with UE support prn with CGA inside bars rockerboard with UE support x 20 reps anterior/posterior                            PT Short Term Goals - 01/12/15 1749    PT SHORT TERM GOAL #1   Title Pt. will have a negative R DIx-Hallpike test without nystagmus and vertigo to indicate that R BPPV has resolved.   Baseline 02-10-15   Time 4   Period Weeks   Status New   PT SHORT TERM GOAL #2   Title Assess TUG with RW when BPPV has resolved and set goal as appropriate.   Baseline 02-10-15  Time 4   Period Weeks   Status New   PT SHORT TERM GOAL #3   Title Pt. will amb. 5" nonstop with RW on flat even surface with SBA   Baseline 02-10-15   Time 4   Period Weeks   Status New   PT SHORT TERM GOAL #4   Title Independent in HEP for balance exercises.   Baseline 02-10-15   Time 4   Period Weeks   Status New   PT SHORT TERM GOAL #5   Title Perform sit to stand with 1 UE support from mat to demo incr. LE strength   Baseline 02-10-15   Time 4   Period Weeks   Status New           PT Long Term Goals - 01/12/15 1759    PT LONG TERM GOAL #1   Title Pt. will improve TUG score by a decrease of at least 5 secs from initial score with use of RW.   Baseline 03-12-15   Time 8   Period Weeks   Status New   PT LONG TERM GOAL #2   Title Pt will incr. gait velocity by at least .8 secs from initial score for incr. gait efficiency.   Baseline 03-12-15   Time 8   Period Weeks   Status New   PT LONG TERM GOAL #3   Title Amb. 53' with single point cane modified  independent for household amb.   Baseline 03-12-15   Time 8   Period Weeks   Status New   PT LONG TERM GOAL #4   Title Pt. will report at least 50% improvement in balance and LE strength.   Baseline 03-12-15   Time 8   Period Weeks   Status New               Plan - 02/02/15 1100    Clinical Impression Statement Pt. demonstrating improved mobiltiy after doing strengthening and stretching exercises - RLE is weaker than LLE - reason unknown   Pt will benefit from skilled therapeutic intervention in order to improve on the following deficits Abnormal gait;Decreased activity tolerance;Pain;Decreased mobility;Decreased balance;Decreased strength;Other (comment)   Rehab Potential Good   PT Frequency 2x / week   PT Duration 8 weeks   PT Treatment/Interventions ADLs/Self Care Home Management;Therapeutic activities;Patient/family education;Therapeutic exercise;Gait training;Balance training;Neuromuscular re-education;Functional mobility training;Other (comment)   PT Next Visit Plan cont  with balance and strengthening   PT Home Exercise Plan standing balance and strengthening   Consulted and Agree with Plan of Care Patient        Problem List Patient Active Problem List   Diagnosis Date Noted  . Concussion with no loss of consciousness 12/30/2013  . Cervical spondylosis without myelopathy 01/01/2013  . Migraine without aura, without mention of intractable migraine without mention of status migrainosus 01/01/2013  . Abnormality of gait 01/01/2013  . Essential and other specified forms of tremor 01/01/2013    Alda Lea 02/02/2015, 11:02 AM  [facility] [address] [place], [place], [address] Phone: [phone]   Fax:  [phone]

## 2015-02-03 ENCOUNTER — Encounter: Payer: Self-pay | Admitting: Physical Therapy

## 2015-02-03 ENCOUNTER — Ambulatory Visit: Payer: PPO | Admitting: Physical Therapy

## 2015-02-03 DIAGNOSIS — H8111 Benign paroxysmal vertigo, right ear: Secondary | ICD-10-CM | POA: Diagnosis not present

## 2015-02-03 DIAGNOSIS — R29898 Other symptoms and signs involving the musculoskeletal system: Secondary | ICD-10-CM

## 2015-02-03 DIAGNOSIS — R269 Unspecified abnormalities of gait and mobility: Secondary | ICD-10-CM

## 2015-02-03 NOTE — Therapy (Signed)
[facility] [address] [place], [place], [address] Phone: [phone]   Fax:  [phone]  Physical Therapy Treatment  Patient Details  Name: Dominique Mckee MRN: [id number] Date of Birth: 1930-03-19 Referring Provider:  Dorian Heckle, MD  Encounter Date: 02/03/2015      PT End of Session - 02/03/15 2204    Visit Number 4  G4   Number of Visits 17   Date for PT Re-Evaluation 03/12/15   Authorization Type [employer] Medicare   Authorization Time Period 01-11-15 - 03-12-15   PT Start Time 1535   PT Stop Time 1619   PT Time Calculation (min) 44 min   Equipment Utilized During Treatment Gait belt      Past Medical History  Diagnosis Date  . Hypertension   . Arthritis   . Degenerative disc disease   . Benign essential tremor   . Migraine headache   . Cervicogenic headache   . Cervical spondylosis   . Lumbar spondylosis   . Gait disorder   . Mild obesity   . GERD (gastroesophageal reflux disease)   . Osteopenia   . Chronic renal insufficiency   . Fibromyalgia   . Rheumatoid arthritis     Past Surgical History  Procedure Laterality Date  . Total knee arthroplasty Bilateral     right  . Abdominal hysterectomy    . Cholecystectomy    . Elbow surgery Left   . Hammer toe surgery Left   . Tonsillectomy    . Appendectomy      There were no vitals filed for this visit.  Visit Diagnosis:  Abnormality of gait  Bilateral leg weakness      Subjective Assessment - 02/03/15 2157    Subjective Pt. states she feels that her legs are already stronger and she is walking better   Pertinent History h/o BPPV last spring - had OP PT at this facility; RA   Patient Stated Goals resolve the vertigo and improve balance; would like to try to get back to using cane    Currently in Pain? No/denies   Multiple Pain Sites No                       [facility] Adult PT Treatment/Exercise - 02/03/15 1547    Ambulation/Gait   Ambulation/Gait Yes   Ambulation/Gait Assistance 4: Min guard   Ambulation Distance (Feet) 240 Feet   Assistive device Straight cane   Ambulation Surface Level;Indoor   Gait Comments RLE is weaker than LLE   Knee/Hip Exercises: Stretches   Active Hamstring Stretch 1 rep;30 seconds  both legs   Knee/Hip Exercises: Standing   Heel Raises 1 set;10 reps   Forward Step Up Step Height: 4";Both  3# weight on each leg   Other Standing Knee Exercises bil. hip flexion, abduction, and extension with 3# weight 10 reps each                  PT Short Term Goals - 01/12/15 1749    PT SHORT TERM GOAL #1   Title Pt. will have a negative R DIx-Hallpike test without nystagmus and vertigo to indicate that R BPPV has resolved.   Baseline 02-10-15   Time 4   Period Weeks   Status New   PT SHORT TERM GOAL #2   Title Assess TUG with RW when BPPV has resolved and set goal as appropriate.   Baseline 02-10-15   Time 4  Period Weeks   Status New   PT SHORT TERM GOAL #3   Title Pt. will amb. 5" nonstop with RW on flat even surface with SBA   Baseline 02-10-15   Time 4   Period Weeks   Status New   PT SHORT TERM GOAL #4   Title Independent in HEP for balance exercises.   Baseline 02-10-15   Time 4   Period Weeks   Status New   PT SHORT TERM GOAL #5   Title Perform sit to stand with 1 UE support from mat to demo incr. LE strength   Baseline 02-10-15   Time 4   Period Weeks   Status New           PT Long Term Goals - 01/12/15 1759    PT LONG TERM GOAL #1   Title Pt. will improve TUG score by a decrease of at least 5 secs from initial score with use of RW.   Baseline 03-12-15   Time 8   Period Weeks   Status New   PT LONG TERM GOAL #2   Title Pt will incr. gait velocity by at least .8 secs from initial score for incr. gait efficiency.   Baseline 03-12-15   Time 8   Period Weeks   Status New   PT LONG TERM GOAL #3   Title Amb. 37' with single point cane  modified independent for household amb.   Baseline 03-12-15   Time 8   Period Weeks   Status New   PT LONG TERM GOAL #4   Title Pt. will report at least 50% improvement in balance and LE strength.   Baseline 03-12-15   Time 8   Period Weeks   Status New               Plan - 02/03/15 2204    Clinical Impression Statement Pt.'s mobility improved today compared to performance on Tues, 02-01-15; pt.'s RLE is weaker than LLE   Pt will benefit from skilled therapeutic intervention in order to improve on the following deficits Abnormal gait;Decreased activity tolerance;Pain;Decreased mobility;Decreased balance;Decreased strength;Other (comment)   Rehab Potential Good   PT Frequency 2x / week   PT Duration 8 weeks   PT Treatment/Interventions ADLs/Self Care Home Management;Therapeutic activities;Patient/family education;Therapeutic exercise;Gait training;Balance training;Neuromuscular re-education;Functional mobility training;Other (comment)   PT Next Visit Plan cont  with balance and strengthening   PT Home Exercise Plan standing balance and strengthening   Consulted and Agree with Plan of Care Patient        Problem List Patient Active Problem List   Diagnosis Date Noted  . Concussion with no loss of consciousness 12/30/2013  . Cervical spondylosis without myelopathy 01/01/2013  . Migraine without aura, without mention of intractable migraine without mention of status migrainosus 01/01/2013  . Abnormality of gait 01/01/2013  . Essential and other specified forms of tremor 01/01/2013    Alda Lea, PT 02/03/2015, 10:10 PM  [facility] [address] [place], [place], [address] Phone: [phone]   Fax:  [phone]

## 2015-02-07 ENCOUNTER — Ambulatory Visit: Payer: PPO

## 2015-02-08 ENCOUNTER — Ambulatory Visit: Payer: PPO | Admitting: Physical Therapy

## 2015-02-08 DIAGNOSIS — R29898 Other symptoms and signs involving the musculoskeletal system: Secondary | ICD-10-CM

## 2015-02-08 DIAGNOSIS — H8111 Benign paroxysmal vertigo, right ear: Secondary | ICD-10-CM | POA: Diagnosis not present

## 2015-02-08 DIAGNOSIS — R269 Unspecified abnormalities of gait and mobility: Secondary | ICD-10-CM

## 2015-02-09 ENCOUNTER — Encounter: Payer: Self-pay | Admitting: Physical Therapy

## 2015-02-09 NOTE — Therapy (Signed)
[facility] [address] [place], [place], [address] Phone: [phone]   Fax:  [phone]  Physical Therapy Treatment  Patient Details  Name: Dominique Mckee MRN: [id number] Date of Birth: Feb 06, 1930 Referring Provider:  Dorian Heckle, MD  Encounter Date: 02/08/2015      PT End of Session - 02/09/15 1005    Visit Number 5  G5   Number of Visits 17   Date for PT Re-Evaluation 03/12/15   Authorization Type U[employer] Medicare   Authorization Time Period 01-11-15 - 03-12-15   PT Start Time 1150   PT Stop Time 1236   PT Time Calculation (min) 46 min      Past Medical History  Diagnosis Date  . Hypertension   . Arthritis   . Degenerative disc disease   . Benign essential tremor   . Migraine headache   . Cervicogenic headache   . Cervical spondylosis   . Lumbar spondylosis   . Gait disorder   . Mild obesity   . GERD (gastroesophageal reflux disease)   . Osteopenia   . Chronic renal insufficiency   . Fibromyalgia   . Rheumatoid arthritis     Past Surgical History  Procedure Laterality Date  . Total knee arthroplasty Bilateral     right  . Abdominal hysterectomy    . Cholecystectomy    . Elbow surgery Left   . Hammer toe surgery Left   . Tonsillectomy    . Appendectomy      There were no vitals filed for this visit.  Visit Diagnosis:  Abnormality of gait  Bilateral leg weakness      Subjective Assessment - 02/09/15 1001    Subjective Pt. reports she is walking better - is more steady and legs are not as stiff   Pertinent History h/o BPPV last spring - had OP PT at this facility; RA   Patient Stated Goals resolve the vertigo and improve balance; would like to try to get back to using cane    Currently in Pain? No/denies                       [facility] Adult PT Treatment/Exercise - 02/09/15 0001    Ambulation/Gait   Ambulation/Gait Yes   Ambulation/Gait Assistance 4: Min guard   Ambulation Distance (Feet) 240 Feet   Assistive device Straight cane   Ambulation Surface Level;Indoor   Stairs Yes   Number of Stairs 4   Height of Stairs 6   Gait Comments R hip flexors weakner than L hip flexors   High Level Balance   High Level Balance Activities Side stepping;Backward walking  marching in place   Knee/Hip Exercises: Stretches   Active Hamstring Stretch 1 rep;30 seconds  both legs   Knee/Hip Exercises: Standing   Heel Raises 1 set;10 reps   Forward Step Up Step Height: 4";Both  3# weight on each leg   Other Standing Knee Exercises bil. hip flexion, abduction, and extension with 3# weight 10 reps each                  PT Short Term Goals - 01/12/15 1749    PT SHORT TERM GOAL #1   Title Pt. will have a negative R DIx-Hallpike test without nystagmus and vertigo to indicate that R BPPV has resolved.   Baseline 02-10-15   Time 4   Period Weeks   Status New   PT SHORT TERM GOAL #2  Title Assess TUG with RW when BPPV has resolved and set goal as appropriate.   Baseline 02-10-15   Time 4   Period Weeks   Status New   PT SHORT TERM GOAL #3   Title Pt. will amb. 5" nonstop with RW on flat even surface with SBA   Baseline 02-10-15   Time 4   Period Weeks   Status New   PT SHORT TERM GOAL #4   Title Independent in HEP for balance exercises.   Baseline 02-10-15   Time 4   Period Weeks   Status New   PT SHORT TERM GOAL #5   Title Perform sit to stand with 1 UE support from mat to demo incr. LE strength   Baseline 02-10-15   Time 4   Period Weeks   Status New           PT Long Term Goals - 01/12/15 1759    PT LONG TERM GOAL #1   Title Pt. will improve TUG score by a decrease of at least 5 secs from initial score with use of RW.   Baseline 03-12-15   Time 8   Period Weeks   Status New   PT LONG TERM GOAL #2   Title Pt will incr. gait velocity by at least .8 secs from initial score for incr. gait efficiency.   Baseline 03-12-15   Time 8    Period Weeks   Status New   PT LONG TERM GOAL #3   Title Amb. 66' with single point cane modified independent for household amb.   Baseline 03-12-15   Time 8   Period Weeks   Status New   PT LONG TERM GOAL #4   Title Pt. will report at least 50% improvement in balance and LE strength.   Baseline 03-12-15   Time 8   Period Weeks   Status New               Plan - 02/09/15 1005    Clinical Impression Statement Pt.'s balance is improving - progressing towards use of cane rather than RW for assistance with ambulation   Pt will benefit from skilled therapeutic intervention in order to improve on the following deficits Abnormal gait;Decreased activity tolerance;Pain;Decreased mobility;Decreased balance;Decreased strength;Other (comment)   Rehab Potential Good   PT Frequency 2x / week   PT Duration 8 weeks   PT Treatment/Interventions ADLs/Self Care Home Management;Therapeutic activities;Patient/family education;Therapeutic exercise;Gait training;Balance training;Neuromuscular re-education;Functional mobility training;Other (comment)   PT Next Visit Plan cont  with balance and strengthening   PT Home Exercise Plan standing balance and strengthening   Consulted and Agree with Plan of Care Patient        Problem List Patient Active Problem List   Diagnosis Date Noted  . Concussion with no loss of consciousness 12/30/2013  . Cervical spondylosis without myelopathy 01/01/2013  . Migraine without aura, without mention of intractable migraine without mention of status migrainosus 01/01/2013  . Abnormality of gait 01/01/2013  . Essential and other specified forms of tremor 01/01/2013    Alda Lea, PT 02/09/2015, 10:08 AM  [facility] [address] [place], [place], [address] Phone: [phone]   Fax:  [phone]

## 2015-02-11 ENCOUNTER — Ambulatory Visit: Payer: PPO | Admitting: Rehabilitative and Restorative Service Providers"

## 2015-02-11 DIAGNOSIS — R269 Unspecified abnormalities of gait and mobility: Secondary | ICD-10-CM

## 2015-02-11 DIAGNOSIS — H8111 Benign paroxysmal vertigo, right ear: Secondary | ICD-10-CM | POA: Diagnosis not present

## 2015-02-11 DIAGNOSIS — R29898 Other symptoms and signs involving the musculoskeletal system: Secondary | ICD-10-CM

## 2015-02-11 NOTE — Therapy (Signed)
[facility] [address] [place], [place], [address] Phone: [phone]   Fax:  [phone]  Physical Therapy Treatment  Patient Details  Name: Dominique Mckee MRN: [id number] Date of Birth: [birth year] Referring Provider:  Dorian Heckle, MD  Encounter Date: 02/11/2015      PT End of Session - 02/11/15 1811    Visit Number 6  G6   Number of Visits 17   Date for PT Re-Evaluation 03/12/15   Authorization Type UHC Medicare   Authorization Time Period 01-11-15 - 03-12-15   PT Start Time 1405   PT Stop Time 1450   PT Time Calculation (min) 45 min   Activity Tolerance Patient tolerated treatment well   Behavior During Therapy [facility] for tasks assessed/performed      Past Medical History  Diagnosis Date  . Hypertension   . Arthritis   . Degenerative disc disease   . Benign essential tremor   . Migraine headache   . Cervicogenic headache   . Cervical spondylosis   . Lumbar spondylosis   . Gait disorder   . Mild obesity   . GERD (gastroesophageal reflux disease)   . Osteopenia   . Chronic renal insufficiency   . Fibromyalgia   . Rheumatoid arthritis     Past Surgical History  Procedure Laterality Date  . Total knee arthroplasty Bilateral     right  . Abdominal hysterectomy    . Cholecystectomy    . Elbow surgery Left   . Hammer toe surgery Left   . Tonsillectomy    . Appendectomy      There were no vitals filed for this visit.  Visit Diagnosis:  Abnormality of gait  Bilateral leg weakness      Subjective Assessment - 02/11/15 1409    Subjective The patient reports she has exercises and is not performing as often as recommended.     Currently in Pain? Yes   Pain Score --  L shoulder pain, PT to monitor but not tx due to nature of referral      NEUROMUSCULAR RE-EDUCATION: Sit<>stand with one UE x 5 reps with cues on posture Standing weight shift with alternating LE foot taps with UE  support Standing balance with postural cues   THERAPEUTIC EXERCISE: Discussed doing seated ankle plantarflexion/dorsiflexion at home for ROM (limited in clinic due to use of ankle braces)  Seated long arc quads  X 10 reps Step-ups to 6" step x 5 reps with bilateral UE support on rails and CGA for safety  Gait: Ambulation with RW x 5.5 minutes nonstop x 345 feet modified independently, then 150 ft Gait with single point cane (with tripod tip) x 115 ft with cues on longer stride length and upright posture Gait x 230 ft with SPC (with tripod tip) with cues as above and CGA for safety 46M=1.11 ft/sec with RW      [facility] PT Assessment - 02/11/15 1419    Standardized Balance Assessment   Standardized Balance Assessment Timed Up and Go Test   Timed Up and Go Test   TUG --  36.06 sec with RW                             PT Short Term Goals - 02/11/15 1417    PT SHORT TERM GOAL #1   Title Pt. will have a negative R DIx-Hallpike test without nystagmus and vertigo to indicate that R BPPV  has resolved.   Baseline Met per subjective report of no dizziness since Epley's maneuver on PT evaluation date.   Time 4   Period Weeks   Status Achieved   PT SHORT TERM GOAL #2   Title Assess TUG with RW when BPPV has resolved and set goal as appropriate.   Baseline Met on 02/11/2015 with TUG score 36.06 seconds with RW.   Time 4   Period Weeks   Status Achieved   PT SHORT TERM GOAL #3   Title Pt. will amb. 5" nonstop with RW on flat even surface with SBA   Baseline Goal met on 02/11/2015 ambulating x 345 ft nonstop.   Time 4   Period Weeks   Status Achieved   PT SHORT TERM GOAL #4   Title Independent in HEP for balance exercises.   Baseline Pt reports being able to independently perform HEP, however not doing at recommended frequency.   Time 4   Period Weeks   Status Achieved   PT SHORT TERM GOAL #5   Title Perform sit to stand with 1 UE support from mat to demo incr. LE  strength   Baseline Goal met on 02/11/2015.   Time 4   Period Weeks   Status Achieved           PT Long Term Goals - 02/11/15 1815    PT LONG TERM GOAL #1   Title Pt. will improve TUG score by a decrease of at least 5 secs from initial score with use of RW.   Baseline 03-12-15 (baseline on 4/15=36.06 sec)   Time 8   Period Weeks   Status On-going   PT LONG TERM GOAL #2   Title Pt will incr. gait velocity by at least .8 secs from initial score for incr. gait efficiency.   Baseline 03-12-15 (measure on 4/15=1.11 ft/sec)   Time 8   Period Weeks   Status On-going   PT LONG TERM GOAL #3   Title Amb. 61' with single point cane modified independent for household amb.   Baseline 03-12-15   Time 8   Period Weeks   Status On-going   PT LONG TERM GOAL #4   Title Pt. will report at least 50% improvement in balance and LE strength.   Baseline 03-12-15   Time 8   Period Weeks   Status On-going               Plan - 02/11/15 1816    Clinical Impression Statement The patient met all STGs today.  She continues to demo fall risk per Tug and gait speed assessment and will benefit from continued PT to address mobility impairments.   PT Next Visit Plan cont  with balance and strengthening   PT Home Exercise Plan standing balance and strengthening   Consulted and Agree with Plan of Care Patient        Problem List Patient Active Problem List   Diagnosis Date Noted  . Concussion with no loss of consciousness 12/30/2013  . Cervical spondylosis without myelopathy 01/01/2013  . Migraine without aura, without mention of intractable migraine without mention of status migrainosus 01/01/2013  . Abnormality of gait 01/01/2013  . Essential and other specified forms of tremor 01/01/2013    [place], PT 02/11/2015, 6:20 PM  [facility] [address] [place], [place], [address] Phone: [phone]   Fax:   [phone]

## 2015-02-14 ENCOUNTER — Ambulatory Visit: Payer: Commercial Managed Care - HMO | Admitting: Adult Health

## 2015-02-15 ENCOUNTER — Ambulatory Visit: Payer: Commercial Managed Care - HMO | Admitting: Adult Health

## 2015-02-15 ENCOUNTER — Ambulatory Visit: Payer: PPO | Admitting: Rehabilitative and Restorative Service Providers"

## 2015-02-15 DIAGNOSIS — R269 Unspecified abnormalities of gait and mobility: Secondary | ICD-10-CM

## 2015-02-15 DIAGNOSIS — R29898 Other symptoms and signs involving the musculoskeletal system: Secondary | ICD-10-CM

## 2015-02-15 DIAGNOSIS — H8111 Benign paroxysmal vertigo, right ear: Secondary | ICD-10-CM | POA: Diagnosis not present

## 2015-02-15 NOTE — Therapy (Signed)
[facility] [address] [place], [place], [address] Phone: [phone]   Fax:  [phone]  Physical Therapy Treatment  Patient Details  Name: Dominique Mckee MRN: [id number] Date of Birth: [birth year] Referring Provider:  Dorian Heckle, MD  Encounter Date: 02/15/2015      PT End of Session - 02/15/15 1421    Visit Number 7  G7   Number of Visits 17   Date for PT Re-Evaluation 03/12/15   Authorization Type UHC Medicare   Authorization Time Period 01-11-15 - 03-12-15   PT Start Time 1236   PT Stop Time 1316   PT Time Calculation (min) 40 min   Activity Tolerance Patient tolerated treatment well   Behavior During Therapy [facility] for tasks assessed/performed      Past Medical History  Diagnosis Date  . Hypertension   . Arthritis   . Degenerative disc disease   . Benign essential tremor   . Migraine headache   . Cervicogenic headache   . Cervical spondylosis   . Lumbar spondylosis   . Gait disorder   . Mild obesity   . GERD (gastroesophageal reflux disease)   . Osteopenia   . Chronic renal insufficiency   . Fibromyalgia   . Rheumatoid arthritis     Past Surgical History  Procedure Laterality Date  . Total knee arthroplasty Bilateral     right  . Abdominal hysterectomy    . Cholecystectomy    . Elbow surgery Left   . Hammer toe surgery Left   . Tonsillectomy    . Appendectomy      There were no vitals filed for this visit.  Visit Diagnosis:  Abnormality of gait  Bilateral leg weakness      Subjective Assessment - 02/15/15 1240    Subjective The patient reports she was sore in her left toe/foot after physical therapy last Friday.  She reports she thinks we walked too much.  The patient reports that exercise helps with her stiffness, but it returns as soon as she sits back down.  She feels weak this morning, greater than stiffness.   Currently in Pain? Yes   Pain Score --  chronic pain from RA    Pain Onset More than a month ago   Pain Frequency Intermittent   Aggravating Factors  sitting for prolonged periods   Pain Relieving Factors injection helps   Effect of Pain on Daily Activities *PT to monitor pain throughout session      Gait: Ambulation on level surfaces with SPC x 230 ft, rested. Repeated 230 ft with SPC and close supervision. Gait with RW x 100 ft x 2 reps modified indep Ambulated 20 ft without a device with CGA to min A with cues on L weight shift and stance control Backwards walking at countertop *has to hold on with R hand due to L hip weakness in order to perform x 10 feet x 3 repetitions   THERAPEUTIC EXERCISE: Sit<>stand x 10 reps decreasing UE support Seated LAQs x 20 reps each leg Sidestepping with UE support 10 feet x 3 repetitions Standing alternating LE foot taps with posterior step at [place]: Discussed gradually increasing activity level within home and avoiding sitting for hours at a time to decrease general stiffness.  Recommended frequent walking/movement while at home.          PT Short Term Goals - 02/11/15 1417    PT SHORT TERM GOAL #1   Title  Pt. will have a negative R DIx-Hallpike test without nystagmus and vertigo to indicate that R BPPV has resolved.   Baseline Met per subjective report of no dizziness since Epley's maneuver on PT evaluation date.   Time 4   Period Weeks   Status Achieved   PT SHORT TERM GOAL #2   Title Assess TUG with RW when BPPV has resolved and set goal as appropriate.   Baseline Met on 02/11/2015 with TUG score 36.06 seconds with RW.   Time 4   Period Weeks   Status Achieved   PT SHORT TERM GOAL #3   Title Pt. will amb. 5" nonstop with RW on flat even surface with SBA   Baseline Goal met on 02/11/2015 ambulating x 345 ft nonstop.   Time 4   Period Weeks   Status Achieved   PT SHORT TERM GOAL #4   Title Independent in HEP for balance exercises.   Baseline Pt reports being able  to independently perform HEP, however not doing at recommended frequency.   Time 4   Period Weeks   Status Achieved   PT SHORT TERM GOAL #5   Title Perform sit to stand with 1 UE support from mat to demo incr. LE strength   Baseline Goal met on 02/11/2015.   Time 4   Period Weeks   Status Achieved           PT Long Term Goals - 02/11/15 1815    PT LONG TERM GOAL #1   Title Pt. will improve TUG score by a decrease of at least 5 secs from initial score with use of RW.   Baseline 03-12-15 (baseline on 4/15=36.06 sec)   Time 8   Period Weeks   Status On-going   PT LONG TERM GOAL #2   Title Pt will incr. gait velocity by at least .8 secs from initial score for incr. gait efficiency.   Baseline 03-12-15 (measure on 4/15=1.11 ft/sec)   Time 8   Period Weeks   Status On-going   PT LONG TERM GOAL #3   Title Amb. 50' with single point cane modified independent for household amb.   Baseline 03-12-15   Time 8   Period Weeks   Status On-going   PT LONG TERM GOAL #4   Title Pt. will report at least 50% improvement in balance and LE strength.   Baseline 03-12-15   Time 8   Period Weeks   Status On-going               Plan - 02/15/15 1422    Clinical Impression Statement The patient is progressing gait with SPC.  PT performed shorter walking today with more frequent rest breaks to decrease amount of soreness she gets after therapy.  Continue to progress to LTGs.   PT Home Exercise Plan standing balance and strengthening   Consulted and Agree with Plan of Care Patient        Problem List Patient Active Problem List   Diagnosis Date Noted  . Concussion with no loss of consciousness 12/30/2013  . Cervical spondylosis without myelopathy 01/01/2013  . Migraine without aura, without mention of intractable migraine without mention of status migrainosus 01/01/2013  . Abnormality of gait 01/01/2013  . Essential and other specified forms of tremor 01/01/2013     [place], PT 02/15/2015, 2:23 PM  [facility] [address] [place], [place], [address] Phone: [phone]   Fax:  [phone]

## 2015-02-17 ENCOUNTER — Ambulatory Visit: Payer: PPO | Admitting: Physical Therapy

## 2015-02-21 ENCOUNTER — Ambulatory Visit: Payer: PPO | Admitting: Physical Therapy

## 2015-02-21 ENCOUNTER — Encounter: Payer: Self-pay | Admitting: Physical Therapy

## 2015-02-21 DIAGNOSIS — R29898 Other symptoms and signs involving the musculoskeletal system: Secondary | ICD-10-CM

## 2015-02-21 DIAGNOSIS — R269 Unspecified abnormalities of gait and mobility: Secondary | ICD-10-CM

## 2015-02-21 DIAGNOSIS — H8111 Benign paroxysmal vertigo, right ear: Secondary | ICD-10-CM | POA: Diagnosis not present

## 2015-02-21 NOTE — Therapy (Signed)
[facility] [address] [place], [place], [address] Phone: [phone]   Fax:  [phone]  Physical Therapy Treatment  Patient Details  Name: Dominique Mckee MRN: [id number] Date of Birth: Jul 24, 1930 Referring Provider:  Dorian Heckle, MD  Encounter Date: 02/21/2015      PT End of Session - 02/21/15 1419    Visit Number 8  G8   Number of Visits 17   Date for PT Re-Evaluation 03/12/15   Authorization Type UHC Medicare   Authorization Time Period 01-11-15 - 03-12-15   PT Start Time 1102   PT Stop Time 1146   PT Time Calculation (min) 44 min   Equipment Utilized During Treatment Gait belt      Past Medical History  Diagnosis Date  . Hypertension   . Arthritis   . Degenerative disc disease   . Benign essential tremor   . Migraine headache   . Cervicogenic headache   . Cervical spondylosis   . Lumbar spondylosis   . Gait disorder   . Mild obesity   . GERD (gastroesophageal reflux disease)   . Osteopenia   . Chronic renal insufficiency   . Fibromyalgia   . Rheumatoid arthritis     Past Surgical History  Procedure Laterality Date  . Total knee arthroplasty Bilateral     right  . Abdominal hysterectomy    . Cholecystectomy    . Elbow surgery Left   . Hammer toe surgery Left   . Tonsillectomy    . Appendectomy      There were no vitals filed for this visit.  Visit Diagnosis:  Abnormality of gait  Bilateral leg weakness      Subjective Assessment - 02/21/15 1411    Subjective Pt. reports she thought she was going to have a re-occurrence of BPPV last Thurs. - was nauseas and had a headache - cancelled PT appt. because she was not feeling well but did not ever become dizzy - is feeling better today - no dizziness has occurred                                                                          Pertinent History h/o BPPV last spring - had OP PT at this facility; RA   Patient Stated Goals resolve  the vertigo and improve balance; would like to try to get back to using cane    Currently in Pain? Yes   Pain Score 3    Pain Location Shoulder   Pain Orientation Left   Pain Descriptors / Indicators Discomfort;Headache;Aching;Sore   Pain Type Chronic pain   Pain Onset More than a month ago   Pain Frequency Intermittent                         [facility] Adult PT Treatment/Exercise - 02/21/15 1414    Transfers   Transfers Sit to Stand   Sit to Stand 5: Supervision  bil. UE support   Ambulation/Gait   Ambulation/Gait Yes   Ambulation/Gait Assistance 4: Min guard   Ambulation Distance (Feet) 360 Feet   Assistive device Straight cane   Gait Pattern Step-to pattern;Decreased step length - right;Decreased step length -  left   Ambulation Surface Level;Indoor   Gait Comments R hip flexors weakner than L hip flexors   Knee/Hip Exercises: Standing   Heel Raises 10 reps   Rocker Board 1 minute  anteriorly/posteriorly   Other Standing Knee Exercises bil. hip flexion, abduction, and extension with red theraband 10 reps each     NeuroRe-ed; sidestepping 10' x 2 inside bars with SBA; stepping over and back of 1/2 bolster with UE support prn With CGA with UE support prn  standing on balance beam for fascilitation of hip strategy - with head turns side to side - x 10 reps each with UE support  prn inside bars with CGA           PT Short Term Goals - 02/11/15 1417    PT SHORT TERM GOAL #1   Title Pt. will have a negative R DIx-Hallpike test without nystagmus and vertigo to indicate that R BPPV has resolved.   Baseline Met per subjective report of no dizziness since Epley's maneuver on PT evaluation date.   Time 4   Period Weeks   Status Achieved   PT SHORT TERM GOAL #2   Title Assess TUG with RW when BPPV has resolved and set goal as appropriate.   Baseline Met on 02/11/2015 with TUG score 36.06 seconds with RW.   Time 4   Period Weeks   Status Achieved   PT SHORT  TERM GOAL #3   Title Pt. will amb. 5" nonstop with RW on flat even surface with SBA   Baseline Goal met on 02/11/2015 ambulating x 345 ft nonstop.   Time 4   Period Weeks   Status Achieved   PT SHORT TERM GOAL #4   Title Independent in HEP for balance exercises.   Baseline Pt reports being able to independently perform HEP, however not doing at recommended frequency.   Time 4   Period Weeks   Status Achieved   PT SHORT TERM GOAL #5   Title Perform sit to stand with 1 UE support from mat to demo incr. LE strength   Baseline Goal met on 02/11/2015.   Time 4   Period Weeks   Status Achieved           PT Long Term Goals - 02/11/15 1815    PT LONG TERM GOAL #1   Title Pt. will improve TUG score by a decrease of at least 5 secs from initial score with use of RW.   Baseline 03-12-15 (baseline on 4/15=36.06 sec)   Time 8   Period Weeks   Status On-going   PT LONG TERM GOAL #2   Title Pt will incr. gait velocity by at least .8 secs from initial score for incr. gait efficiency.   Baseline 03-12-15 (measure on 4/15=1.11 ft/sec)   Time 8   Period Weeks   Status On-going   PT LONG TERM GOAL #3   Title Amb. 65' with single point cane modified independent for household amb.   Baseline 03-12-15   Time 8   Period Weeks   Status On-going   PT LONG TERM GOAL #4   Title Pt. will report at least 50% improvement in balance and LE strength.   Baseline 03-12-15   Time 8   Period Weeks   Status On-going               Plan - 02/21/15 1419    Clinical Impression Statement Pt. progressing with improving balance and gait - pt.  able to amb. 360' with cane with no LOB; pt. demonstrating improved endurance as amb. distance is increasing   Pt will benefit from skilled therapeutic intervention in order to improve on the following deficits Abnormal gait;Decreased activity tolerance;Pain;Decreased mobility;Decreased balance;Decreased strength;Other (comment)   Rehab Potential Good   PT Frequency  2x / week   PT Duration 8 weeks   PT Treatment/Interventions ADLs/Self Care Home Management;Therapeutic activities;Patient/family education;Therapeutic exercise;Gait training;Balance training;Neuromuscular re-education;Functional mobility training;Other (comment)   PT Next Visit Plan cont  with balance and strengthening   PT Home Exercise Plan standing balance and strengthening   Consulted and Agree with Plan of Care Patient        Problem List Patient Active Problem List   Diagnosis Date Noted  . Concussion with no loss of consciousness 12/30/2013  . Cervical spondylosis without myelopathy 01/01/2013  . Migraine without aura, without mention of intractable migraine without mention of status migrainosus 01/01/2013  . Abnormality of gait 01/01/2013  . Essential and other specified forms of tremor 01/01/2013    Nikeshia Keetch, [place], PT 02/21/2015, 2:27 PM  [facility] [address] [place], [place], [address] Phone: [phone]   Fax:  [phone]

## 2015-02-22 ENCOUNTER — Ambulatory Visit: Payer: PPO | Admitting: Physical Therapy

## 2015-02-22 ENCOUNTER — Encounter: Payer: Self-pay | Admitting: Physical Therapy

## 2015-02-22 DIAGNOSIS — R269 Unspecified abnormalities of gait and mobility: Secondary | ICD-10-CM

## 2015-02-22 DIAGNOSIS — R29898 Other symptoms and signs involving the musculoskeletal system: Secondary | ICD-10-CM

## 2015-02-22 DIAGNOSIS — H8111 Benign paroxysmal vertigo, right ear: Secondary | ICD-10-CM | POA: Diagnosis not present

## 2015-02-23 ENCOUNTER — Encounter: Payer: Self-pay | Admitting: Physical Therapy

## 2015-02-23 NOTE — Therapy (Signed)
[facility] [address] [place], [place], [address] Phone: [phone]   Fax:  [phone]  Physical Therapy Treatment  Patient Details  Name: Dominique Mckee MRN: [id number] Date of Birth: 18-Nov-1929 Referring Provider:  Dorian Heckle, MD  Encounter Date: 02/22/2015      PT End of Session - 02/23/15 0903    Visit Number 9  G9   Number of Visits 17   Date for PT Re-Evaluation 03/12/15   Authorization Type [employer] Medicare   Authorization Time Period 01-11-15 - 03-12-15   PT Start Time 0932   PT Stop Time 1017   PT Time Calculation (min) 45 min      Past Medical History  Diagnosis Date  . Hypertension   . Arthritis   . Degenerative disc disease   . Benign essential tremor   . Migraine headache   . Cervicogenic headache   . Cervical spondylosis   . Lumbar spondylosis   . Gait disorder   . Mild obesity   . GERD (gastroesophageal reflux disease)   . Osteopenia   . Chronic renal insufficiency   . Fibromyalgia   . Rheumatoid arthritis     Past Surgical History  Procedure Laterality Date  . Total knee arthroplasty Bilateral     right  . Abdominal hysterectomy    . Cholecystectomy    . Elbow surgery Left   . Hammer toe surgery Left   . Tonsillectomy    . Appendectomy      There were no vitals filed for this visit.  Visit Diagnosis:  Abnormality of gait  Bilateral leg weakness      Subjective Assessment - 02/22/15 0937    Subjective Pt. thinks she may need muscle relaxor medication - due to achiness all over - both shoulders and right arm hurts in elbow and left wrist hurts today   Pertinent History h/o BPPV last spring - had OP PT at this facility; RA   Patient Stated Goals resolve the vertigo and improve balance; would like to try to get back to using cane    Currently in Pain? Yes                         [facility] Adult PT Treatment/Exercise - 02/23/15 0001    Ambulation/Gait   Ambulation/Gait Yes   Ambulation/Gait Assistance 4: Min guard   Ambulation Distance (Feet) 250 Feet   Assistive device Straight cane   Gait Pattern Step-to pattern;Decreased step length - right;Decreased step length - left   Ambulation Surface Level   Stairs Yes   Stair Management Technique Two rails;Step to pattern   Number of Stairs 4   Height of Stairs 6   High Level Balance   High Level Balance Activities Side stepping;Backward walking  marching in place   Knee/Hip Exercises: Stretches   Active Hamstring Stretch 1 rep;30 seconds  both legs   Gastroc Stretch 2 reps;20 seconds   Knee/Hip Exercises: Standing   Heel Raises 10 reps   Forward Step Up Both;10 reps;Step Height: 6"   Rocker Board 1 minute                  PT Short Term Goals - 02/11/15 1417    PT SHORT TERM GOAL #1   Title Pt. will have a negative R DIx-Hallpike test without nystagmus and vertigo to indicate that R BPPV has resolved.   Baseline Met per subjective report of no dizziness  since Epley's maneuver on PT evaluation date.   Time 4   Period Weeks   Status Achieved   PT SHORT TERM GOAL #2   Title Assess TUG with RW when BPPV has resolved and set goal as appropriate.   Baseline Met on 02/11/2015 with TUG score 36.06 seconds with RW.   Time 4   Period Weeks   Status Achieved   PT SHORT TERM GOAL #3   Title Pt. will amb. 5" nonstop with RW on flat even surface with SBA   Baseline Goal met on 02/11/2015 ambulating x 345 ft nonstop.   Time 4   Period Weeks   Status Achieved   PT SHORT TERM GOAL #4   Title Independent in HEP for balance exercises.   Baseline Pt reports being able to independently perform HEP, however not doing at recommended frequency.   Time 4   Period Weeks   Status Achieved   PT SHORT TERM GOAL #5   Title Perform sit to stand with 1 UE support from mat to demo incr. LE strength   Baseline Goal met on 02/11/2015.   Time 4   Period Weeks   Status Achieved           PT  Long Term Goals - 02/11/15 1815    PT LONG TERM GOAL #1   Title Pt. will improve TUG score by a decrease of at least 5 secs from initial score with use of RW.   Baseline 03-12-15 (baseline on 4/15=36.06 sec)   Time 8   Period Weeks   Status On-going   PT LONG TERM GOAL #2   Title Pt will incr. gait velocity by at least .8 secs from initial score for incr. gait efficiency.   Baseline 03-12-15 (measure on 4/15=1.11 ft/sec)   Time 8   Period Weeks   Status On-going   PT LONG TERM GOAL #3   Title Amb. 22' with single point cane modified independent for household amb.   Baseline 03-12-15   Time 8   Period Weeks   Status On-going   PT LONG TERM GOAL #4   Title Pt. will report at least 50% improvement in balance and LE strength.   Baseline 03-12-15   Time 8   Period Weeks   Status On-going               Plan - 02/23/15 9678    Clinical Impression Statement Pt. progressing towards goals - c/o fatigue this am due to early morning appt. but balance is improving slowly; informed pt. that balance is sufficient to begin using cane for assistance with household amb.   Pt will benefit from skilled therapeutic intervention in order to improve on the following deficits Abnormal gait;Decreased activity tolerance;Pain;Decreased mobility;Decreased balance;Decreased strength;Other (comment)   Rehab Potential Good   PT Frequency 2x / week   PT Duration 8 weeks   PT Treatment/Interventions ADLs/Self Care Home Management;Therapeutic activities;Patient/family education;Therapeutic exercise;Gait training;Balance training;Neuromuscular re-education;Functional mobility training;Other (comment)   PT Next Visit Plan do G code; cont balance and gait training   PT Home Exercise Plan standing balance and strengthening   Consulted and Agree with Plan of Care Patient        Problem List Patient Active Problem List   Diagnosis Date Noted  . Concussion with no loss of consciousness 12/30/2013  .  Cervical spondylosis without myelopathy 01/01/2013  . Migraine without aura, without mention of intractable migraine without mention of status migrainosus 01/01/2013  . Abnormality  of gait 01/01/2013  . Essential and other specified forms of tremor 01/01/2013    Alda Lea, PT 02/23/2015, 9:08 AM  [facility] [address] [place], [place], [address] Phone: [phone]   Fax:  [phone]

## 2015-02-28 ENCOUNTER — Encounter: Payer: Self-pay | Admitting: Neurology

## 2015-02-28 ENCOUNTER — Ambulatory Visit (INDEPENDENT_AMBULATORY_CARE_PROVIDER_SITE_OTHER): Payer: PPO | Admitting: Neurology

## 2015-02-28 ENCOUNTER — Encounter: Payer: Self-pay | Admitting: Physical Therapy

## 2015-02-28 ENCOUNTER — Ambulatory Visit: Payer: PPO | Attending: Internal Medicine | Admitting: Physical Therapy

## 2015-02-28 VITALS — BP 154/74 | HR 63 | Ht 62.0 in | Wt 197.2 lb

## 2015-02-28 DIAGNOSIS — R269 Unspecified abnormalities of gait and mobility: Secondary | ICD-10-CM

## 2015-02-28 DIAGNOSIS — H8111 Benign paroxysmal vertigo, right ear: Secondary | ICD-10-CM | POA: Insufficient documentation

## 2015-02-28 DIAGNOSIS — R251 Tremor, unspecified: Secondary | ICD-10-CM

## 2015-02-28 DIAGNOSIS — M47812 Spondylosis without myelopathy or radiculopathy, cervical region: Secondary | ICD-10-CM | POA: Diagnosis not present

## 2015-02-28 DIAGNOSIS — G43009 Migraine without aura, not intractable, without status migrainosus: Secondary | ICD-10-CM | POA: Diagnosis not present

## 2015-02-28 DIAGNOSIS — G250 Essential tremor: Secondary | ICD-10-CM

## 2015-02-28 DIAGNOSIS — R29898 Other symptoms and signs involving the musculoskeletal system: Secondary | ICD-10-CM

## 2015-02-28 DIAGNOSIS — G252 Other specified forms of tremor: Secondary | ICD-10-CM

## 2015-02-28 MED ORDER — PROPRANOLOL HCL 60 MG PO TABS: 60.0000 mg | ORAL_TABLET | Freq: Two times a day (BID) | ORAL | Status: DC

## 2015-02-28 NOTE — Patient Instructions (Signed)

## 2015-02-28 NOTE — Progress Notes (Signed)
Reason for visit: Dizziness  Dominique Mckee is an 81 y.o. female  History of present illness:  Dominique Mckee is an 81 year old right-handed white female with a history of an essential tremor. The patient has significant arthritic issues with osteoarthritis and rheumatoid arthritis. The patient has undergone physical therapy for benign positional vertigo which has been helpful. The patient has gotten better with the severe vertigo, but she has ongoing issues with lightheadedness. The patient has neck stiffness and shoulder stiffness, and if she does stretching exercises on her neck and shoulders, the dizziness sensation will improve transiently. The patient uses a walker for ambulation, she has not had any recent falls. She has some anxiety associated with the episodes of dizziness. The patient has noted that the tremors will come and go, and seem to be more prominent when the dizziness is present. She returns to the office today for an evaluation. She is on propranolol taking 40 mg twice daily for the tremor with some benefit.  Past Medical History  Diagnosis Date  . Hypertension   . Arthritis   . Degenerative disc disease   . Benign essential tremor   . Migraine headache   . Cervicogenic headache   . Cervical spondylosis   . Lumbar spondylosis   . Gait disorder   . Mild obesity   . GERD (gastroesophageal reflux disease)   . Osteopenia   . Chronic renal insufficiency   . Fibromyalgia   . Rheumatoid arthritis     Past Surgical History  Procedure Laterality Date  . Total knee arthroplasty Bilateral     right  . Abdominal hysterectomy    . Cholecystectomy    . Elbow surgery Left   . Hammer toe surgery Left   . Tonsillectomy    . Appendectomy      Family History  Problem Relation Age of Onset  . Anesthesia problems Neg Hx   . Hypertension Neg Hx   . Aortic aneurysm Father   . Lung cancer Sister   . Lung cancer Brother   . Alzheimer's disease Brother   . Dementia Brother    . Parkinsonism Brother   . Bone cancer Sister     Social history:  reports that she has never smoked. She has never used smokeless tobacco. She reports that she does not drink alcohol or use illicit drugs.    Allergies  Allergen Reactions  . Feldene [Piroxicam]     Kidney issues  . Clinoril [Sulindac] Rash    Medications:  Prior to Admission medications   Medication Sig Start Date End Date Taking? Authorizing Provider  amLODipine (NORVASC) 5 MG tablet Take 5 mg by mouth every morning.   Yes Historical Provider, MD  benazepril (LOTENSIN) 20 MG tablet Take 20 mg by mouth every morning.   Yes Historical Provider, MD  Calcium Carb-Cholecalciferol (CALCIUM 500 +D PO) Take 1 tablet by mouth 2 (two) times daily. Calcium 500 mg & Vitamin D 1000 units   Yes Historical Provider, MD  diclofenac sodium (VOLTAREN) 1 % GEL Apply 1 application topically 2 (two) times daily as needed. For pain   Yes Historical Provider, MD  DULoxetine (CYMBALTA) 30 MG capsule Take 60 mg by mouth daily.  05/14/13  Yes Historical Provider, MD  fentaNYL (DURAGESIC - DOSED MCG/HR) 25 MCG/HR Place 1 patch onto the skin every 3 (three) days.   Yes Historical Provider, MD  ferrous sulfate 325 (65 FE) MG tablet Take 325 mg by mouth every evening.  Yes Historical Provider, MD  folic acid (FOLVITE) 1 MG tablet Take 1 mg by mouth daily.  12/29/13  Yes Historical Provider, MD  hydrochlorothiazide (HYDRODIURIL) 25 MG tablet Take 12.5 mg by mouth daily.    Yes Historical Provider, MD  hydroxychloroquine (PLAQUENIL) 200 MG tablet Take 2 tablets by mouth daily. 02/17/15  Yes Historical Provider, MD  ketoconazole (NIZORAL) 2 % cream Apply 2 g topically daily. 11/08/13  Yes Historical Provider, MD  Methotrexate, PF, 10 MG/0.4ML SOAJ Inject 15 mg into the skin once a week.   Yes Historical Provider, MD  Oxycodone HCl 10 MG TABS Take 10 mg by mouth 2 (two) times daily.  05/04/13  Yes Historical Provider, MD  pantoprazole (PROTONIX) 40 MG  tablet Take 40 mg by mouth daily. 12/23/13  Yes Historical Provider, MD  propranolol (INDERAL) 40 MG tablet Take 1 tablet (40 mg total) by mouth 2 (two) times daily. 08/16/14  Yes Kathrynn Ducking, MD    ROS:  Out of a complete 14 system review of symptoms, the patient complains only of the following symptoms, and all other reviewed systems are negative.  Fatigue, excessive sweating Ringing in the ears, runny nose Eye itching, eye redness, blurred vision Wheezing, chest tightness Leg swelling Flushing Nausea Frequent waking Joint pain, joint swelling, back pain, achy muscles, muscle cramps, walking difficulty, neck pain, neck stiffness Dizziness, headache, tremors Depression, anxiety  Blood pressure 154/74, pulse 63, height 5\' 2"  (1.575 m), weight 197 lb 3.2 oz (89.449 kg).  Physical Exam  General: The patient is alert and cooperative at the time of the examination. The patient is moderately obese.  Skin: No significant peripheral edema is noted.   Neurologic Exam  Mental status: The patient is alert and oriented x 3 at the time of the examination. The patient has apparent normal recent and remote memory, with an apparently normal attention span and concentration ability.   Cranial nerves: Facial symmetry is present. Speech is normal, no aphasia or dysarthria is noted. Extraocular movements are full. Visual fields are full.  Motor: The patient has good strength in all 4 extremities.  Sensory examination: Soft touch sensation is symmetric on the face, arms, and legs.  Coordination: The patient has good finger-nose-finger and heel-to-shin bilaterally. The patient has an intention tremor with finger-nose-finger bilaterally.  Gait and station: The patient has a wide-based, unsteady gait. The patient uses a walker for ambulation. Tandem gait was not attempted. Romberg is negative. No drift is seen.  Reflexes: Deep tendon reflexes are symmetric, but are  depressed.   Assessment/Plan:  1. Benign essential tremor  2. Benign positional vertigo  3. Cervical spondylosis  4. Gait disturbance  5. Degenerative arthritis, rheumatoid arthritis  The patient has multiple issues. She has a significant gait disorder, but she has been relatively stable using a walker. The patient has lightheaded sensations that may be related to the cervical spondylosis, and muscle spasm. She also has true vertigo that responds to the Epley maneuvers with vestibular rehabilitation. The patient has an essential tremor, this has been improved somewhat with Inderal, we will go up on the dose to 60 mg twice daily. The patient will follow-up in 6 months, or sooner if needed. Safety with walking was emphasized.  Jill Alexanders MD 02/28/2015 7:41 PM  [facility] [address] [place], [place] [address]  Phone [phone] Fax [phone]

## 2015-02-28 NOTE — Therapy (Signed)
[facility] [address] [place], [place], [address] Phone: [phone]   Fax:  [phone]  Physical Therapy Treatment  Patient Details  Name: Dominique Mckee MRN: [id number] Date of Birth: 01-02-1930 Referring Provider:  Dorian Heckle, MD  Encounter Date: 02/28/2015      PT End of Session - 02/28/15 1658    Visit Number 10  G10   Number of Visits 17   Date for PT Re-Evaluation 03/12/15   Authorization Type [employer] Medicare   Authorization Time Period 01-11-15 - 03-12-15   PT Start Time 1245   PT Stop Time 1334   PT Time Calculation (min) 49 min      Past Medical History  Diagnosis Date  . Hypertension   . Arthritis   . Degenerative disc disease   . Benign essential tremor   . Migraine headache   . Cervicogenic headache   . Cervical spondylosis   . Lumbar spondylosis   . Gait disorder   . Mild obesity   . GERD (gastroesophageal reflux disease)   . Osteopenia   . Chronic renal insufficiency   . Fibromyalgia   . Rheumatoid arthritis     Past Surgical History  Procedure Laterality Date  . Total knee arthroplasty Bilateral     right  . Abdominal hysterectomy    . Cholecystectomy    . Elbow surgery Left   . Hammer toe surgery Left   . Tonsillectomy    . Appendectomy      There were no vitals filed for this visit.  Visit Diagnosis:  Bilateral leg weakness  Abnormality of gait      Subjective Assessment - 02/28/15 1649    Subjective Pt. has appt. with Dr. Jannifer Franklin today at 3:15 - thought PT appt. time was 11:00 today (actually 1:15); states appt. times for PT and Dr. Jannifer Franklin have been changed and thinks she just got confused; feels that her legs are getting much stronger   Pertinent History h/o BPPV last spring - had OP PT at this facility; RA   Patient Stated Goals resolve the vertigo and improve balance; would like to try to get back to using cane                          [facility]  Adult PT Treatment/Exercise - 02/28/15 1650    Ambulation/Gait   Ambulation/Gait Yes   Ambulation/Gait Assistance --  SBA   Ambulation Distance (Feet) 250 Feet   Assistive device Straight cane   Gait Pattern Step-to pattern;Decreased step length - right;Decreased step length - left   Ambulation Surface Level;Indoor   High Level Balance   High Level Balance Activities Side stepping;Backward walking  marching in place   Knee/Hip Exercises: Stretches   Active Hamstring Stretch 1 rep;20 seconds  bil. LE's   Gastroc Stretch 1 rep;30 seconds  bil. LE   Gastroc Stretch Limitations s   Knee/Hip Exercises: Standing   Heel Raises 10 reps   Rocker Board 2 minutes   Other Standing Knee Exercises ankle sways x 10 reps     Mulligan's stretch with towel for L cervical rotators - added to HEP for home program           PT Education - 02/28/15 1654    Education provided Yes   Education Details instructed in Mulligan's stretch with use of towel for cervical rotation stretch - 3 reps x 30 sec hold   Person(s) Educated  Patient   Methods Explanation;Demonstration   Comprehension Verbalized understanding;Returned demonstration          PT Short Term Goals - 02/11/15 1417    PT SHORT TERM GOAL #1   Title Pt. will have a negative R DIx-Hallpike test without nystagmus and vertigo to indicate that R BPPV has resolved.   Baseline Met per subjective report of no dizziness since Epley's maneuver on PT evaluation date.   Time 4   Period Weeks   Status Achieved   PT SHORT TERM GOAL #2   Title Assess TUG with RW when BPPV has resolved and set goal as appropriate.   Baseline Met on 02/11/2015 with TUG score 36.06 seconds with RW.   Time 4   Period Weeks   Status Achieved   PT SHORT TERM GOAL #3   Title Pt. will amb. 5" nonstop with RW on flat even surface with SBA   Baseline Goal met on 02/11/2015 ambulating x 345 ft nonstop.   Time 4   Period Weeks   Status Achieved   PT SHORT TERM GOAL  #4   Title Independent in HEP for balance exercises.   Baseline Pt reports being able to independently perform HEP, however not doing at recommended frequency.   Time 4   Period Weeks   Status Achieved   PT SHORT TERM GOAL #5   Title Perform sit to stand with 1 UE support from mat to demo incr. LE strength   Baseline Goal met on 02/11/2015.   Time 4   Period Weeks   Status Achieved           PT Long Term Goals - 02/11/15 1815    PT LONG TERM GOAL #1   Title Pt. will improve TUG score by a decrease of at least 5 secs from initial score with use of RW.   Baseline 03-12-15 (baseline on 4/15=36.06 sec)   Time 8   Period Weeks   Status On-going   PT LONG TERM GOAL #2   Title Pt will incr. gait velocity by at least .8 secs from initial score for incr. gait efficiency.   Baseline 03-12-15 (measure on 4/15=1.11 ft/sec)   Time 8   Period Weeks   Status On-going   PT LONG TERM GOAL #3   Title Amb. 101' with single point cane modified independent for household amb.   Baseline 03-12-15   Time 8   Period Weeks   Status On-going   PT LONG TERM GOAL #4   Title Pt. will report at least 50% improvement in balance and LE strength.   Baseline 03-12-15   Time 8   Period Weeks   Status On-going               Plan - 02/28/15 1713    Clinical Impression Statement Pt. is progressing towards LTG's - gait and balance much improved today with pt. able to use cane without LOB and with incr. steadiness noted with ambulation; vertigo resolved - discharged G code other category   Pt will benefit from skilled therapeutic intervention in order to improve on the following deficits Abnormal gait;Decreased activity tolerance;Pain;Decreased mobility;Decreased balance;Decreased strength;Other (comment)   Rehab Potential Good   PT Frequency 2x / week   PT Duration 8 weeks   PT Treatment/Interventions ADLs/Self Care Home Management;Therapeutic activities;Patient/family education;Therapeutic  exercise;Gait training;Balance training;Neuromuscular re-education;Functional mobility training;Other (comment)   PT Next Visit Plan do G code for mobility category next visit; cont gait and balance  PT Home Exercise Plan standing balance and strengthening   Consulted and Agree with Plan of Care Patient          G-Codes - 03/11/15 1704    Functional Assessment Tool Used TUG = 36.06 secs with RW:  gait velocity 1.11 ft/sec with RW   Functional Limitation Other PT subsequent   Other PT Primary Goal Status (F0722) At least 20 percent but less than 40 percent impaired, limited or restricted   Other PT Secondary Discharge Status (V7505) At least 1 percent but less than 20 percent impaired, limited or restricted      Problem List Patient Active Problem List   Diagnosis Date Noted  . Concussion with no loss of consciousness 12/30/2013  . Cervical spondylosis without myelopathy 01/01/2013  . Migraine without aura 01/01/2013  . Abnormality of gait 01/01/2013  . Essential and other specified forms of tremor 01/01/2013    Alda Lea, PT 11-Mar-2015, 5:16 PM  [facility] [address] [place], [place], [address] Phone: [phone]   Fax:  [phone]

## 2015-03-01 ENCOUNTER — Ambulatory Visit: Payer: Commercial Managed Care - HMO | Admitting: Neurology

## 2015-03-03 ENCOUNTER — Ambulatory Visit: Payer: PPO | Admitting: Physical Therapy

## 2015-03-08 ENCOUNTER — Ambulatory Visit: Payer: PPO | Admitting: Physical Therapy

## 2015-03-08 ENCOUNTER — Encounter: Payer: Self-pay | Admitting: Physical Therapy

## 2015-03-08 DIAGNOSIS — R29898 Other symptoms and signs involving the musculoskeletal system: Secondary | ICD-10-CM

## 2015-03-08 DIAGNOSIS — R269 Unspecified abnormalities of gait and mobility: Secondary | ICD-10-CM

## 2015-03-08 DIAGNOSIS — H8111 Benign paroxysmal vertigo, right ear: Secondary | ICD-10-CM | POA: Diagnosis not present

## 2015-03-08 NOTE — Therapy (Signed)
[facility] [address] [place], [place], [address] Phone: [phone]   Fax:  [phone]  Physical Therapy Treatment  Patient Details  Name: Dominique Mckee MRN: [id number] Date of Birth: 1930-07-13 Referring Provider:  Dorian Heckle, MD  Encounter Date: 03/08/2015      PT End of Session - 03/08/15 1716    Visit Number 11  G1   Number of Visits 17   Date for PT Re-Evaluation 03/12/15   Authorization Type [employer] Medicare   Authorization Time Period 01-11-15 - 03-12-15   PT Start Time 1445   PT Stop Time 1531   PT Time Calculation (min) 46 min   Equipment Utilized During Treatment Gait belt      Past Medical History  Diagnosis Date  . Hypertension   . Arthritis   . Degenerative disc disease   . Benign essential tremor   . Migraine headache   . Cervicogenic headache   . Cervical spondylosis   . Lumbar spondylosis   . Gait disorder   . Mild obesity   . GERD (gastroesophageal reflux disease)   . Osteopenia   . Chronic renal insufficiency   . Fibromyalgia   . Rheumatoid arthritis     Past Surgical History  Procedure Laterality Date  . Total knee arthroplasty Bilateral     right  . Abdominal hysterectomy    . Cholecystectomy    . Elbow surgery Left   . Hammer toe surgery Left   . Tonsillectomy    . Appendectomy      There were no vitals filed for this visit.  Visit Diagnosis:  Bilateral leg weakness  Abnormality of gait      Subjective Assessment - 03/08/15 1711    Subjective Pt. reports Dr. Jannifer Franklin increased "my tremor medication to 60 mg because my tremors were so bad  the day that I saw him"; pt. states she feels legs are very stiff today   Pertinent History h/o BPPV last spring - had OP PT at this facility; RA   Patient Stated Goals resolve the vertigo and improve balance; would like to try to get back to using cane    Currently in Pain? Yes   Pain Score 3    Pain Location Shoulder   Pain  Orientation Left   Pain Descriptors / Indicators Discomfort;Headache;Aching;Sore   Pain Type Chronic pain   Pain Onset More than a month ago   Pain Frequency Intermittent   Multiple Pain Sites No      Gait; 120' with cane with CGA with cues for posture and step length NeuroRe-ed; single limb stance activities inside bars with UE support prn with CGA - stepping over and back of bolster x 10 reps Each leg; rockerboard with UE support x 1" anterior/posteriorly; sidestepping 10' x 2 reps; sit to stand on floor x 5 reps with minimal UE support TherEx; bil. Hip flexion, extension, and abduction with green theraband x 10 reps each in standing Bil. Hamstring and heel cord stretch x 30 sec hold x 1 rep each - bil. LE's                              PT Short Term Goals - 02/11/15 1417    PT SHORT TERM GOAL #1   Title Pt. will have a negative R DIx-Hallpike test without nystagmus and vertigo to indicate that R BPPV has resolved.   Baseline Met per  subjective report of no dizziness since Epley's maneuver on PT evaluation date.   Time 4   Period Weeks   Status Achieved   PT SHORT TERM GOAL #2   Title Assess TUG with RW when BPPV has resolved and set goal as appropriate.   Baseline Met on 02/11/2015 with TUG score 36.06 seconds with RW.   Time 4   Period Weeks   Status Achieved   PT SHORT TERM GOAL #3   Title Pt. will amb. 5" nonstop with RW on flat even surface with SBA   Baseline Goal met on 02/11/2015 ambulating x 345 ft nonstop.   Time 4   Period Weeks   Status Achieved   PT SHORT TERM GOAL #4   Title Independent in HEP for balance exercises.   Baseline Pt reports being able to independently perform HEP, however not doing at recommended frequency.   Time 4   Period Weeks   Status Achieved   PT SHORT TERM GOAL #5   Title Perform sit to stand with 1 UE support from mat to demo incr. LE strength   Baseline Goal met on 02/11/2015.   Time 4   Period Weeks   Status  Achieved           PT Long Term Goals - 02/11/15 1815    PT LONG TERM GOAL #1   Title Pt. will improve TUG score by a decrease of at least 5 secs from initial score with use of RW.   Baseline 03-12-15 (baseline on 4/15=36.06 sec)   Time 8   Period Weeks   Status On-going   PT LONG TERM GOAL #2   Title Pt will incr. gait velocity by at least .8 secs from initial score for incr. gait efficiency.   Baseline 03-12-15 (measure on 4/15=1.11 ft/sec)   Time 8   Period Weeks   Status On-going   PT LONG TERM GOAL #3   Title Amb. 78' with single point cane modified independent for household amb.   Baseline 03-12-15   Time 8   Period Weeks   Status On-going   PT LONG TERM GOAL #4   Title Pt. will report at least 50% improvement in balance and LE strength.   Baseline 03-12-15   Time 8   Period Weeks   Status On-going               Plan - 03-18-2015 1719    Clinical Impression Statement Pt. is improving slowly with balance - has weakness in bil. hips - has difficulty lifting each leg in stepping over object   Pt will benefit from skilled therapeutic intervention in order to improve on the following deficits Abnormal gait;Decreased activity tolerance;Pain;Decreased mobility;Decreased balance;Decreased strength;Other (comment)   Rehab Potential Good   PT Frequency 2x / week   PT Duration 8 weeks   PT Treatment/Interventions ADLs/Self Care Home Management;Therapeutic activities;Patient/family education;Therapeutic exercise;Gait training;Balance training;Neuromuscular re-education;Functional mobility training;Other (comment)   PT Next Visit Plan check LTG's and do renewal   PT Home Exercise Plan standing balance and strengthening   Consulted and Agree with Plan of Care Patient          G-Codes - 03/18/2015 1732    Functional Assessment Tool Used TUG = 36.06 secs with RW:  gait velocity 1.11 ft/sec with RW   Functional Limitation Mobility: Walking and moving around   Mobility:  Walking and Moving Around Current Status (O1751) At least 40 percent but less than 60 percent impaired, limited  or restricted   Mobility: Walking and Moving Around Goal Status [phone]) At least 20 percent but less than 40 percent impaired, limited or restricted      Problem List Patient Active Problem List   Diagnosis Date Noted  . Concussion with no loss of consciousness 12/30/2013  . Cervical spondylosis without myelopathy 01/01/2013  . Migraine without aura 01/01/2013  . Abnormality of gait 01/01/2013  . Essential and other specified forms of tremor 01/01/2013    Linnie Delgrande, [place], PT 03/08/2015, 5:35 PM  [facility] [address] [place], [place], [address] Phone: [phone]   Fax:  [phone]

## 2015-03-10 ENCOUNTER — Ambulatory Visit: Payer: PPO | Admitting: Physical Therapy

## 2015-03-10 DIAGNOSIS — R29898 Other symptoms and signs involving the musculoskeletal system: Secondary | ICD-10-CM

## 2015-03-10 DIAGNOSIS — R269 Unspecified abnormalities of gait and mobility: Secondary | ICD-10-CM

## 2015-03-10 DIAGNOSIS — H8111 Benign paroxysmal vertigo, right ear: Secondary | ICD-10-CM | POA: Diagnosis not present

## 2015-03-11 ENCOUNTER — Encounter: Payer: Self-pay | Admitting: Physical Therapy

## 2015-03-11 NOTE — Therapy (Signed)
[facility] [address] [place], [place], [address] Phone: [phone]   Fax:  [phone]  Physical Therapy Treatment  Patient Details  Name: Dominique Mckee MRN: [id number] Date of Birth: May 12, 1930 Referring Provider:  Dorian Heckle, MD  Encounter Date: 03/10/2015      PT End of Session - 03/11/15 1821    Visit Number 12  G2   Number of Visits 17   Date for PT Re-Evaluation 03/12/15   Authorization Type [employer] Medicare   Authorization Time Period 01-11-15 - 03-12-15   PT Start Time 1450   PT Stop Time 1531   PT Time Calculation (min) 41 min   Equipment Utilized During Treatment Gait belt      Past Medical History  Diagnosis Date  . Hypertension   . Arthritis   . Degenerative disc disease   . Benign essential tremor   . Migraine headache   . Cervicogenic headache   . Cervical spondylosis   . Lumbar spondylosis   . Gait disorder   . Mild obesity   . GERD (gastroesophageal reflux disease)   . Osteopenia   . Chronic renal insufficiency   . Fibromyalgia   . Rheumatoid arthritis     Past Surgical History  Procedure Laterality Date  . Total knee arthroplasty Bilateral     right  . Abdominal hysterectomy    . Cholecystectomy    . Elbow surgery Left   . Hammer toe surgery Left   . Tonsillectomy    . Appendectomy      There were no vitals filed for this visit.  Visit Diagnosis:  Abnormality of gait  Bilateral leg weakness      Subjective Assessment - 03/11/15 1818    Subjective "I'm doing good today"  Pt. reports feeling stronger today   Pertinent History h/o BPPV last spring - had OP PT at this facility; RA   Patient Stated Goals resolve the vertigo and improve balance; would like to try to get back to using cane    Currently in Pain? Yes   Pain Score 3    Pain Location Shoulder   Pain Orientation Right;Left   Pain Descriptors / Indicators Aching;Sore;Discomfort   Pain Onset More than a  month ago   Pain Frequency Intermittent                         [facility] Adult PT Treatment/Exercise - 03/11/15 0001    Transfers   Transfers Sit to Stand   Sit to Stand 5: Supervision  bil. UE support   Ambulation/Gait   Ambulation/Gait Yes   Ambulation/Gait Assistance 5: Supervision  CGA   Ambulation Distance (Feet) 250 Feet   Assistive device Straight cane   Gait Pattern Step-to pattern;Decreased step length - right;Decreased step length - left   Ambulation Surface Level;Indoor   Gait Comments R hip flexors weakner than L hip flexors   High Level Balance   High Level Balance Activities Side stepping;Backward walking  marching in place   Knee/Hip Exercises: Stretches   Active Hamstring Stretch 1 rep;20 seconds  bil. LE's   Gastroc Stretch 1 rep;30 seconds  bil. LE   Knee/Hip Exercises: Standing   Forward Step Up Right;Left;10 reps;Step Height: 6"   Rocker Board 1 minute                  PT Short Term Goals - 02/11/15 1417    PT SHORT TERM GOAL #  1   Title Pt. will have a negative R DIx-Hallpike test without nystagmus and vertigo to indicate that R BPPV has resolved.   Baseline Met per subjective report of no dizziness since Epley's maneuver on PT evaluation date.   Time 4   Period Weeks   Status Achieved   PT SHORT TERM GOAL #2   Title Assess TUG with RW when BPPV has resolved and set goal as appropriate.   Baseline Met on 02/11/2015 with TUG score 36.06 seconds with RW.   Time 4   Period Weeks   Status Achieved   PT SHORT TERM GOAL #3   Title Pt. will amb. 5" nonstop with RW on flat even surface with SBA   Baseline Goal met on 02/11/2015 ambulating x 345 ft nonstop.   Time 4   Period Weeks   Status Achieved   PT SHORT TERM GOAL #4   Title Independent in HEP for balance exercises.   Baseline Pt reports being able to independently perform HEP, however not doing at recommended frequency.   Time 4   Period Weeks   Status Achieved   PT SHORT  TERM GOAL #5   Title Perform sit to stand with 1 UE support from mat to demo incr. LE strength   Baseline Goal met on 02/11/2015.   Time 4   Period Weeks   Status Achieved           PT Long Term Goals - 03/11/15 1823    PT LONG TERM GOAL #1   Title Pt. will improve TUG score by a decrease of at least 5 secs from initial score with use of RW.   Baseline 20.68 secs with RW   Status Achieved               Plan - 03/11/15 1822    Clinical Impression Statement Pt. demonstrating increased balance today with incr. staediness noted with ambulation   Pt will benefit from skilled therapeutic intervention in order to improve on the following deficits Abnormal gait;Decreased activity tolerance;Pain;Decreased mobility;Decreased balance;Decreased strength;Other (comment)   Rehab Potential Good   PT Frequency 2x / week   PT Duration 8 weeks   PT Treatment/Interventions ADLs/Self Care Home Management;Therapeutic activities;Patient/family education;Therapeutic exercise;Gait training;Balance training;Neuromuscular re-education;Functional mobility training;Other (comment)   PT Next Visit Plan check LTG's and do renewal   PT Home Exercise Plan standing balance and strengthening   Consulted and Agree with Plan of Care Patient        Problem List Patient Active Problem List   Diagnosis Date Noted  . Concussion with no loss of consciousness 12/30/2013  . Cervical spondylosis without myelopathy 01/01/2013  . Migraine without aura 01/01/2013  . Abnormality of gait 01/01/2013  . Essential and other specified forms of tremor 01/01/2013    Alda Lea, PT 03/11/2015, 6:25 PM  [facility] [address] [place], [place], [address] Phone: [phone]   Fax:  [phone]

## 2015-03-15 ENCOUNTER — Encounter: Payer: Self-pay | Admitting: Physical Therapy

## 2015-03-15 ENCOUNTER — Ambulatory Visit: Payer: PPO | Admitting: Physical Therapy

## 2015-03-15 DIAGNOSIS — R29898 Other symptoms and signs involving the musculoskeletal system: Secondary | ICD-10-CM

## 2015-03-15 DIAGNOSIS — H8111 Benign paroxysmal vertigo, right ear: Secondary | ICD-10-CM | POA: Diagnosis not present

## 2015-03-15 DIAGNOSIS — R269 Unspecified abnormalities of gait and mobility: Secondary | ICD-10-CM

## 2015-03-16 ENCOUNTER — Encounter: Payer: Self-pay | Admitting: Physical Therapy

## 2015-03-16 NOTE — Therapy (Signed)
[facility] [address] [place], [place], [address] Phone: [phone]   Fax:  [phone]  Physical Therapy Treatment  Patient Details  Name: Dominique Mckee MRN: [id number] Date of Birth: 11-Oct-1930 Referring Provider:  Dorian Heckle, MD  Encounter Date: 03/15/2015      PT End of Session - 03/16/15 0954    Visit Number 13  G3   Number of Visits 17   Date for PT Re-Evaluation 03/16/15   Authorization Type [employer] Medicare   Authorization Time Period 03-16-15 - 05-15-15   PT Start Time 1315   PT Stop Time 1401   PT Time Calculation (min) 46 min      Past Medical History  Diagnosis Date  . Hypertension   . Arthritis   . Degenerative disc disease   . Benign essential tremor   . Migraine headache   . Cervicogenic headache   . Cervical spondylosis   . Lumbar spondylosis   . Gait disorder   . Mild obesity   . GERD (gastroesophageal reflux disease)   . Osteopenia   . Chronic renal insufficiency   . Fibromyalgia   . Rheumatoid arthritis     Past Surgical History  Procedure Laterality Date  . Total knee arthroplasty Bilateral     right  . Abdominal hysterectomy    . Cholecystectomy    . Elbow surgery Left   . Hammer toe surgery Left   . Tonsillectomy    . Appendectomy      There were no vitals filed for this visit.  Visit Diagnosis:  Abnormality of gait - Plan: PT plan of care cert/re-cert  Bilateral leg weakness - Plan: PT plan of care cert/re-cert      Subjective Assessment - 03/16/15 0951    Subjective Pt. reports using cane this am in the house - did not lose balance but was slow with walking   Pertinent History h/o BPPV last spring - had OP PT at this facility; RA   Patient Stated Goals resolve the vertigo and improve balance; would like to try to get back to using cane                          [facility] Adult PT Treatment/Exercise - 03/16/15 0001    Transfers   Transfers Sit  to Stand   Sit to Stand 5: Supervision  bil. UE support   Ambulation/Gait   Ambulation/Gait Yes   Ambulation/Gait Assistance 5: Supervision  CGA   Ambulation Distance (Feet) 240 Feet   Assistive device Straight cane   Gait Pattern Step-to pattern;Decreased step length - right;Decreased step length - left   Ambulation Surface Level;Indoor   Knee/Hip Exercises: Stretches   Passive Hamstring Stretch 1 rep;30 seconds  bil. LE's   Gastroc Stretch 1 rep;30 seconds  bil. LE   Knee/Hip Exercises: Standing   Forward Step Up Right;Left;10 reps;Step Height: 6"   Knee/Hip Exercises: Supine   Knee Extension AROM;Both;1 set  3# weight used on RLE & LLE     NeuroRe-ed; sit to stand x 5 reps with minimal UE support; alternate tap ups to 4" step with UE support with CGA Stepping over and back of bolster with UE support x 5 reps each leg marching in place x 10 reps each with UE support prn with SBA            PT Short Term Goals - 02/11/15 1417    PT SHORT  TERM GOAL #1   Title Pt. will have a negative R DIx-Hallpike test without nystagmus and vertigo to indicate that R BPPV has resolved.   Baseline Met per subjective report of no dizziness since Epley's maneuver on PT evaluation date.   Time 4   Period Weeks   Status Achieved   PT SHORT TERM GOAL #2   Title Assess TUG with RW when BPPV has resolved and set goal as appropriate.   Baseline Met on 02/11/2015 with TUG score 36.06 seconds with RW.   Time 4   Period Weeks   Status Achieved   PT SHORT TERM GOAL #3   Title Pt. will amb. 5" nonstop with RW on flat even surface with SBA   Baseline Goal met on 02/11/2015 ambulating x 345 ft nonstop.   Time 4   Period Weeks   Status Achieved   PT SHORT TERM GOAL #4   Title Independent in HEP for balance exercises.   Baseline Pt reports being able to independently perform HEP, however not doing at recommended frequency.   Time 4   Period Weeks   Status Achieved   PT SHORT TERM GOAL #5    Title Perform sit to stand with 1 UE support from mat to demo incr. LE strength   Baseline Goal met on 02/11/2015.   Time 4   Period Weeks   Status Achieved           PT Long Term Goals - 03/16/15 0955    PT LONG TERM GOAL #1   Title Pt. will improve TUG score by a decrease of at least 5 secs from initial score with use of RW.   Status Achieved   PT LONG TERM GOAL #2   Title Pt will incr. gait velocity by at least .8 secs from initial score for incr. gait efficiency.   Baseline 03-12-15 (measure on 4/15=1.11 ft/sec)   Time 4  04-16-15   Period Weeks   Status On-going   PT LONG TERM GOAL #3   Title Amb. 76' with single point cane modified independent for household amb.   Baseline pt used cane in home on 03-16-15 in am per pt report but is not using consistently   Time 4  04-16-15   Status On-going   PT LONG TERM GOAL #4   Title Pt. will report at least 50% improvement in balance and LE strength.   Status Achieved   PT LONG TERM GOAL #5   Title Improve TUG score to </= 28 secs with RW for decr. fall risk  (04-15-15)   Baseline 36.06 secs with RW on 02-10-15   Time 4   Period Weeks   Status New   Additional Long Term Goals   Additional Long Term Goals Yes   PT LONG TERM GOAL #6   Title Pt. will amb. 20' with cane modified independently for household amb. and report using cane in home at least 50% time (04-15-15)   Baseline pt. amb. short distances only with cane inconsistently per pt report as of 03-16-15   Time 4   Period Weeks   Status New               Problem List Patient Active Problem List   Diagnosis Date Noted  . Concussion with no loss of consciousness 12/30/2013  . Cervical spondylosis without myelopathy 01/01/2013  . Migraine without aura 01/01/2013  . Abnormality of gait 01/01/2013  . Essential and other specified forms of tremor 01/01/2013  Alda Lea, PT  03/16/2015, 12:00 PM  [facility] [address] [place], [place], [address] Phone: [phone]   Fax:  [phone]

## 2015-03-17 ENCOUNTER — Ambulatory Visit: Payer: PPO | Admitting: Physical Therapy

## 2015-03-22 ENCOUNTER — Ambulatory Visit: Payer: PPO | Admitting: Physical Therapy

## 2015-03-24 ENCOUNTER — Encounter: Payer: PPO | Admitting: Physical Therapy

## 2015-03-29 ENCOUNTER — Encounter: Payer: PPO | Admitting: Physical Therapy

## 2015-03-30 ENCOUNTER — Ambulatory Visit: Payer: PPO

## 2015-03-31 ENCOUNTER — Encounter: Payer: PPO | Admitting: Physical Therapy

## 2015-04-05 ENCOUNTER — Encounter: Payer: PPO | Admitting: Physical Therapy

## 2015-04-07 ENCOUNTER — Encounter: Payer: PPO | Admitting: Rehabilitative and Restorative Service Providers"

## 2015-06-14 ENCOUNTER — Other Ambulatory Visit: Payer: Self-pay | Admitting: Orthopaedic Surgery

## 2015-06-14 DIAGNOSIS — M542 Cervicalgia: Secondary | ICD-10-CM

## 2015-06-18 ENCOUNTER — Other Ambulatory Visit: Payer: PPO

## 2015-06-21 ENCOUNTER — Ambulatory Visit
Admission: RE | Admit: 2015-06-21 | Discharge: 2015-06-21 | Disposition: A | Discharge status: Home / Self Care | Payer: PPO | Source: Ambulatory Visit | Attending: Orthopaedic Surgery | Admitting: Orthopaedic Surgery

## 2015-06-21 DIAGNOSIS — M542 Cervicalgia: Secondary | ICD-10-CM

## 2015-06-23 ENCOUNTER — Emergency Department (HOSPITAL_COMMUNITY): Payer: PPO

## 2015-06-23 ENCOUNTER — Encounter (HOSPITAL_COMMUNITY): Payer: Self-pay | Admitting: Emergency Medicine

## 2015-06-23 ENCOUNTER — Emergency Department (HOSPITAL_COMMUNITY)
Admission: EM | Admit: 2015-06-23 | Discharge: 2015-06-23 | Disposition: A | Discharge status: Home / Self Care | Payer: PPO | Attending: Emergency Medicine | Admitting: Emergency Medicine

## 2015-06-23 DIAGNOSIS — S43004A Unspecified dislocation of right shoulder joint, initial encounter: Secondary | ICD-10-CM | POA: Diagnosis not present

## 2015-06-23 DIAGNOSIS — M1990 Unspecified osteoarthritis, unspecified site: Secondary | ICD-10-CM | POA: Diagnosis not present

## 2015-06-23 DIAGNOSIS — S4991XA Unspecified injury of right shoulder and upper arm, initial encounter: Secondary | ICD-10-CM | POA: Diagnosis present

## 2015-06-23 DIAGNOSIS — Y9289 Other specified places as the place of occurrence of the external cause: Secondary | ICD-10-CM | POA: Insufficient documentation

## 2015-06-23 DIAGNOSIS — Y998 Other external cause status: Secondary | ICD-10-CM | POA: Insufficient documentation

## 2015-06-23 DIAGNOSIS — E669 Obesity, unspecified: Secondary | ICD-10-CM | POA: Diagnosis not present

## 2015-06-23 DIAGNOSIS — X58XXXA Exposure to other specified factors, initial encounter: Secondary | ICD-10-CM | POA: Insufficient documentation

## 2015-06-23 DIAGNOSIS — Z87448 Personal history of other diseases of urinary system: Secondary | ICD-10-CM | POA: Diagnosis not present

## 2015-06-23 DIAGNOSIS — Z79899 Other long term (current) drug therapy: Secondary | ICD-10-CM | POA: Diagnosis not present

## 2015-06-23 DIAGNOSIS — Y9389 Activity, other specified: Secondary | ICD-10-CM | POA: Insufficient documentation

## 2015-06-23 DIAGNOSIS — K219 Gastro-esophageal reflux disease without esophagitis: Secondary | ICD-10-CM | POA: Diagnosis not present

## 2015-06-23 DIAGNOSIS — Z8669 Personal history of other diseases of the nervous system and sense organs: Secondary | ICD-10-CM | POA: Diagnosis not present

## 2015-06-23 DIAGNOSIS — I10 Essential (primary) hypertension: Secondary | ICD-10-CM | POA: Diagnosis not present

## 2015-06-23 LAB — BASIC METABOLIC PANEL
ANION GAP: 6 (ref 5–15)
BUN: 25 mg/dL — ABNORMAL HIGH (ref 6–20)
CALCIUM: 8.9 mg/dL (ref 8.9–10.3)
CO2: 27 mmol/L (ref 22–32)
Chloride: 106 mmol/L (ref 101–111)
Creatinine, Ser: 1.06 mg/dL — ABNORMAL HIGH (ref 0.44–1.00)
GFR, EST AFRICAN AMERICAN: 55 mL/min — AB (ref >60)
GFR, EST NON AFRICAN AMERICAN: 47 mL/min — AB (ref >60)
Glucose, Bld: 177 mg/dL — ABNORMAL HIGH (ref 65–99)
POTASSIUM: 4.2 mmol/L (ref 3.5–5.1)
SODIUM: 139 mmol/L (ref 135–145)

## 2015-06-23 LAB — CBC WITH DIFFERENTIAL/PLATELET
BASOS ABS: 0 10*3/uL (ref 0.0–0.1)
Basophils Relative: 0 % (ref 0–1)
EOS ABS: 0 10*3/uL (ref 0.0–0.7)
EOS PCT: 0 % (ref 0–5)
HCT: 35.3 % — ABNORMAL LOW (ref 36.0–46.0)
Hemoglobin: 11.2 g/dL — ABNORMAL LOW (ref 12.0–15.0)
LYMPHS ABS: 0.8 10*3/uL (ref 0.7–4.0)
Lymphocytes Relative: 11 % — ABNORMAL LOW (ref 12–46)
MCH: 31.4 pg (ref 26.0–34.0)
MCHC: 31.7 g/dL (ref 30.0–36.0)
MCV: 98.9 fL (ref 78.0–100.0)
MONO ABS: 0.3 10*3/uL (ref 0.1–1.0)
Monocytes Relative: 4 % (ref 3–12)
NEUTROS PCT: 85 % — AB (ref 43–77)
Neutro Abs: 6.6 10*3/uL (ref 1.7–7.7)
PLATELETS: UNDETERMINED 10*3/uL (ref 150–400)
RBC: 3.57 MIL/uL — AB (ref 3.87–5.11)
RDW: 15.5 % (ref 11.5–15.5)
WBC: 7.7 10*3/uL (ref 4.0–10.5)

## 2015-06-23 LAB — C-REACTIVE PROTEIN: CRP: 0.5 mg/dL (ref <1.0)

## 2015-06-23 MED ORDER — ETOMIDATE 2 MG/ML IV SOLN
INTRAVENOUS | Status: AC | PRN
  Administered 2015-06-23: 8 mg via INTRAVENOUS

## 2015-06-23 MED ORDER — FENTANYL CITRATE (PF) 100 MCG/2ML IJ SOLN
50.0000 ug | Freq: Once | INTRAMUSCULAR | Status: AC
  Administered 2015-06-23: 50 ug via INTRAVENOUS
  Filled 2015-06-23: qty 2

## 2015-06-23 MED ORDER — PROPOFOL 10 MG/ML IV BOLUS
60.0000 mg | Freq: Once | INTRAVENOUS | Status: DC
  Filled 2015-06-23: qty 1

## 2015-06-23 MED ORDER — ETOMIDATE 2 MG/ML IV SOLN
16.0000 mg | Freq: Once | INTRAVENOUS | Status: DC
  Filled 2015-06-23: qty 10

## 2015-06-23 MED ORDER — PROPOFOL 10 MG/ML IV BOLUS
INTRAVENOUS | Status: AC | PRN
  Administered 2015-06-23: 60 mg via INTRAVENOUS

## 2015-06-23 NOTE — ED Notes (Signed)
Patient remains slightly dislocated.  Patient to be discharged after consult with orthopedics.

## 2015-06-23 NOTE — Discharge Instructions (Signed)
The shoulder is a lot better - but still possibly slightly dislocated. CALL THE ORTHOPEDICS doctor immediately, and set up an appointment. Please ice the shoulder, and keep the arm in the sling all the time.   Shoulder Dislocation Your shoulder is made up of three bones: the collar bone (clavicle); the shoulder blade (scapula), which includes the socket (glenoid cavity); and the upper arm bone (humerus). Your shoulder joint is the place where these bones meet. Strong, fibrous tissues hold these bones together (ligaments). Muscles and strong, fibrous tissues that connect the muscles to these bones (tendons) allow your arm to move through this joint. The range of motion of your shoulder joint is more extensive than most of your other joints, and the glenoid cavity is very shallow. That is the reason that your shoulder joint is one of the most unstable joints in your body. It is far more prone to dislocation than your other joints. Shoulder dislocation is when your humerus is forced out of your shoulder joint. CAUSES Shoulder dislocation is caused by a forceful impact on your shoulder. This impact usually is from an injury, such as a sports injury or a fall. SYMPTOMS Symptoms of shoulder dislocation include:  Deformity of your shoulder.  Intense pain.  Inability to move your shoulder joint.  Numbness, weakness, or tingling around your shoulder joint (your neck or down your arm).  Bruising or swelling around your shoulder. DIAGNOSIS In order to diagnose a dislocated shoulder, your caregiver will perform a physical exam. Your caregiver also may have an X-ray exam done to see if you have any broken bones. Magnetic resonance imaging (MRI) is a procedure that sometimes is done to help your caregiver see any damage to the soft tissues around your shoulder, particularly your rotator cuff tendons. Additionally, your caregiver also may have electromyography done to measure the electrical discharges  produced in your muscles if you have signs or symptoms of nerve damage. TREATMENT A shoulder dislocation is treated by placing the humerus back in the joint (reduction). Your caregiver does this either manually (closed reduction), by moving your humerus back into the joint through manipulation, or through surgery (open reduction). When your humerus is back in place, severe pain should improve almost immediately. You also may need to have surgery if you have a weak shoulder joint or ligaments, and you have recurring shoulder dislocations, despite rehabilitation. In rare cases, surgery is necessary if your nerves or blood vessels are damaged during the dislocation. After your reduction, your arm will be placed in a shoulder immobilizer or sling to keep it from moving. Your caregiver will have you wear your shoulder immobilizer or sling for 3 days to 3 weeks, depending on how serious your dislocation is. When your shoulder immobilizer or sling is removed, your caregiver may prescribe physical therapy to help improve the range of motion in your shoulder joint. HOME CARE INSTRUCTIONS  The following measures can help to reduce pain and speed up the healing process:  Rest your injured joint. Do not move it. Avoid activities similar to the one that caused your injury.  Apply ice to your injured joint for the first day or two after your reduction or as directed by your caregiver. Applying ice helps to reduce inflammation and pain.  Put ice in a plastic bag.  Place a towel between your skin and the bag.  Leave the ice on for 15-20 minutes at a time, every 2 hours while you are awake.  Exercise your hand by squeezing  a soft ball. This helps to eliminate stiffness and swelling in your hand and wrist.  Take over-the-counter or prescription medicine for pain or discomfort as told by your caregiver. SEEK IMMEDIATE MEDICAL CARE IF:   Your shoulder immobilizer or sling becomes damaged.  Your pain becomes  worse rather than better.  You lose feeling in your arm or hand, or they become white and cold. MAKE SURE YOU:   Understand these instructions.  Will watch your condition.  Will get help right away if you are not doing well or get worse. Document Released: 07/10/2001 Document Revised: 03/01/2014 Document Reviewed: 08/05/2011 [facility] Patient Information 2015 [place], [place]. This information is not intended to replace advice given to you by your health care provider. Make sure you discuss any questions you have with your health care provider.

## 2015-06-23 NOTE — ED Notes (Signed)
Patient presents to ED via [facility] with chronic neck and right shoulder pain.  Patient has appointment with rheumatologist today for same pain.  Patient took her Oxycodone 30 minutes ago.

## 2015-06-23 NOTE — ED Notes (Signed)
Discharge instructions given and reviewed with patient.  Patient verbalized understanding to follow up with orthopedics as needed.  Patient discharged home in good condition.

## 2015-06-23 NOTE — ED Provider Notes (Signed)
CSN: [id number]     Arrival date & time 06/23/15  9480 History   First MD Initiated Contact with Patient 06/23/15 [phone]     Chief Complaint  Patient presents with  . Shoulder Pain     (Consider location/radiation/quality/duration/timing/severity/associated sxs/prior Treatment) HPI Comments: Pt comes in with cc of shoulder pain. Pt has hx of rheumatoid arthitis, DJD. She has had some L sided shoulder pain for the past 2 weeks. Pt heard a pop 2 weeks ago and has been having pain off and on since then. She reports being seen by outside doctors for it. She reports that the pain got severe when she was trying to reach for cleaning material this morning, and so she decided to come to the ER. There is no n/v/f/c.   Patient is a 81 y.o. female presenting with shoulder pain. The history is provided by the patient.  Shoulder Pain Associated symptoms: no neck pain     Past Medical History  Diagnosis Date  . Hypertension   . Arthritis   . Degenerative disc disease   . Benign essential tremor   . Migraine headache   . Cervicogenic headache   . Cervical spondylosis   . Lumbar spondylosis   . Gait disorder   . Mild obesity   . GERD (gastroesophageal reflux disease)   . Osteopenia   . Chronic renal insufficiency   . Fibromyalgia   . Rheumatoid arthritis    Past Surgical History  Procedure Laterality Date  . Total knee arthroplasty Bilateral     right  . Abdominal hysterectomy    . Cholecystectomy    . Elbow surgery Left   . Hammer toe surgery Left   . Tonsillectomy    . Appendectomy     Family History  Problem Relation Age of Onset  . Anesthesia problems Neg Hx   . Hypertension Neg Hx   . Aortic aneurysm Father   . Lung cancer Sister   . Lung cancer Brother   . Alzheimer's disease Brother   . Dementia Brother   . Parkinsonism Brother   . Bone cancer Sister    Social History  Substance Use Topics  . Smoking status: Never Smoker   . Smokeless tobacco: Never Used  . Alcohol  Use: No   OB History    No data available     Review of Systems  Constitutional: Positive for activity change.  Respiratory: Negative for shortness of breath.   Cardiovascular: Negative for chest pain.  Gastrointestinal: Negative for nausea, vomiting and abdominal pain.  Genitourinary: Negative for dysuria.  Musculoskeletal: Positive for myalgias and arthralgias. Negative for neck pain.  Neurological: Positive for numbness. Negative for headaches.      Allergies  Feldene and Clinoril  Home Medications   Prior to Admission medications   Medication Sig Start Date End Date Taking? Authorizing Provider  amLODipine (NORVASC) 5 MG tablet Take 5 mg by mouth every morning.   Yes Historical Provider, MD  benazepril (LOTENSIN) 20 MG tablet Take 20 mg by mouth daily. 06/01/15  Yes Historical Provider, MD  Calcium Carb-Cholecalciferol (CALCIUM 500 +D PO) Take 1 tablet by mouth 2 (two) times daily. Calcium 500 mg & Vitamin D 1000 units   Yes Historical Provider, MD  diclofenac sodium (VOLTAREN) 1 % GEL Apply 1 application topically 2 (two) times daily as needed (pain).    Yes Historical Provider, MD  DULoxetine (CYMBALTA) 30 MG capsule Take 60 mg by mouth daily.  05/14/13  Yes Historical Provider,  MD  fentaNYL (DURAGESIC - DOSED MCG/HR) 25 MCG/HR Place 1 patch onto the skin every 3 (three) days.   Yes Historical Provider, MD  ferrous sulfate 325 (65 FE) MG tablet Take 325 mg by mouth every evening.   Yes Historical Provider, MD  folic acid (FOLVITE) 1 MG tablet Take 3 mg by mouth daily.  12/29/13  Yes Historical Provider, MD  hydrochlorothiazide (HYDRODIURIL) 25 MG tablet Take 12.5 mg by mouth daily.    Yes Historical Provider, MD  hydroxychloroquine (PLAQUENIL) 200 MG tablet Take 400 mg by mouth daily.  02/17/15  Yes Historical Provider, MD  methotrexate 50 MG/2ML injection Inject 0.7 mLs into the muscle every 7 (seven) days. 06/09/15  Yes Historical Provider, MD  methylPREDNISolone (MEDROL DOSEPAK)  4 MG TBPK tablet Take 4-24 mg by mouth See admin instructions. Day 1: Two tablets before breakfast, one after lunch, one after dinner, and two at bedtime. If started late in the day, take all six tablets at once or divide into two or three doses, unless otherwise directed by prescriber. Day 2: One tablet before breakfast, one after lunch, one after dinner, and two at bedtime Day 3: One tablet before breakfast, one after lunch, one after dinner, and one at bedtime Day 4: One tablet before breakfast, one after lunch, and one at bedtime Day 5: One tablet before breakfast and one at bedtime Day 6: One tablet before breakfast 06/22/15  Yes Historical Provider, MD  Oxycodone HCl 10 MG TABS Take 10 mg by mouth 3 (three) times daily.  05/04/13  Yes Historical Provider, MD  pantoprazole (PROTONIX) 40 MG tablet Take 40 mg by mouth daily. 12/23/13  Yes Historical Provider, MD  propranolol (INDERAL) 60 MG tablet Take 1 tablet (60 mg total) by mouth 2 (two) times daily. 02/28/15  Yes Kathrynn Ducking, MD   BP 146/70 mmHg  Pulse 63  Temp(Src) 97.8 F (36.6 C) (Oral)  Resp 22  SpO2 91% Physical Exam  Constitutional: She is oriented to person, place, and time. She appears well-developed.  HENT:  Head: Normocephalic and atraumatic.  Eyes: EOM are normal.  Neck: Normal range of motion. Neck supple.  Cardiovascular: Normal rate.   Pulmonary/Chest: Effort normal.  Abdominal: Bowel sounds are normal.  Musculoskeletal:  Severe R sided shoulder tenderness, with minimal palpation and movement. No redness, warmth to touch. 2+ radial pulse. + numbness in the deltoid region  Neurological: She is alert and oriented to person, place, and time.  Skin: Skin is warm and dry.  Nursing note and vitals reviewed.   ED Course  Procedures (including critical care time) Labs Review Labs Reviewed  CBC WITH DIFFERENTIAL/PLATELET - Abnormal; Notable for the following:    RBC 3.57 (*)    Hemoglobin 11.2 (*)    HCT 35.3 (*)     Neutrophils Relative % 85 (*)    Lymphocytes Relative 11 (*)    All other components within normal limits  BASIC METABOLIC PANEL - Abnormal; Notable for the following:    Glucose, Bld 177 (*)    BUN 25 (*)    Creatinine, Ser 1.06 (*)    GFR calc non Af Amer 47 (*)    GFR calc Af Amer 55 (*)    All other components within normal limits  C-REACTIVE PROTEIN    Imaging Review Dg Shoulder Right  06/23/2015   CLINICAL DATA:  Acute right shoulder injury and pain today. Initial encounter.  EXAM: RIGHT SHOULDER - 2+ VIEW  COMPARISON:  None.  FINDINGS: Anterior inferior subcoracoid dislocation of the right humeral head is noted.  No definite fracture is identified.  IMPRESSION: Anterior inferior subcoracoid dislocation of the right humeral head.   Electronically Signed   By: Margarette Canada M.D.   On: 06/23/2015 10:43   Dg Wrist Complete Right  06/23/2015   CLINICAL DATA:  Right wrist pain after a right shoulder reduction.  EXAM: RIGHT WRIST - COMPLETE 3+ VIEW  COMPARISON:  None.  FINDINGS: Minimal radial styloid spur formation. No fracture or dislocation seen.  IMPRESSION: No fracture.   Electronically Signed   By: Claudie Revering M.D.   On: 06/23/2015 14:56   Dg Shoulder Right Port  06/23/2015   CLINICAL DATA:  Post reduction of right shoulder.  EXAM: PORTABLE RIGHT SHOULDER - 2+ VIEW  COMPARISON:  Earlier today.  FINDINGS: There is persistent residual anterior subluxation of the right shoulder, with associated superior migration of the humeral head on the glenoid. No fracture is seen.  IMPRESSION: Persistent residual anterior subluxation of the right shoulder.   Electronically Signed   By: Fidela Salisbury M.D.   On: 06/23/2015 14:59   Dg Shoulder Right Port  06/23/2015   CLINICAL DATA:  Status post reduction of a right shoulder dislocation.  EXAM: PORTABLE RIGHT SHOULDER - 2+ VIEW  COMPARISON:  Earlier today.  FINDINGS: The previously seen anterior dislocation has been partially reduced, with  residual anterior subluxation and superior migration. No fracture seen.  IMPRESSION: Partially reduced anterior dislocation.   Electronically Signed   By: Claudie Revering M.D.   On: 06/23/2015 12:28   I have personally reviewed and evaluated these images and lab results as part of my medical decision-making.   EKG Interpretation None      MDM   Final diagnoses:  Dislocated shoulder, right, initial encounter    Pt comes in with cc of shoulder pain. She is immunosuppressed and the tenderness is severe - ddx is septic joint vs. Fracture/dislocation Hx is vague - but the xrays confirms a shoulder dislocation. Unknown what the time of dislocation is.   Procedural sedation Performed by: Varney Biles Consent: Verbal consent obtained. Risks and benefits: risks, benefits and alternatives were discussed Required items: required blood products, implants, devices, and special equipment available Patient identity confirmed: arm band and provided demographic data Time out: Immediately prior to procedure a "time out" was called to verify the correct patient, procedure, equipment, support staff and site/side marked as required.  Sedation type: moderate (conscious) sedation NPO time confirmed and considedered  Sedatives: PROPOFOL  Physician Time at Bedside: 35 minutes  Vitals: Vital signs were monitored during sedation. Cardiac Monitor, pulse oximeter Patient tolerance: Patient tolerated the procedure well with no immediate complications. Comments: Pt with uneventful recovered. Returned to pre-procedural sedation baseline   Reduction of dislocation Date/Time: 11:30 pm Performed by: Varney Biles Authorized by: Varney Biles Consent: Verbal consent obtained. Risks and benefits: risks, benefits and alternatives were discussed Consent given by: patient Required items: required blood products, implants, devices, and special equipment available Time out: Immediately prior to procedure a  "time out" was called to verify the correct patient, procedure, equipment, support staff and site/side marked as required.  Patient sedated: YES  Vitals: Vital signs were monitored during sedation. Patient tolerance: Patient tolerated the procedure well with no immediate complications. Joint: RIGHT SHOULDER Reduction technique: traction/counter-traction    REPEAT XRAYS SHOWED IMPROVED REDUCTION, BUT persistent subluxation. WE ATTEMPTED REDUCTION AGAIN - WITH ETOMIDATE.  Procedural sedation Performed by: Varney Biles  Consent: Verbal consent obtained. Risks and benefits: risks, benefits and alternatives were discussed Required items: required blood products, implants, devices, and special equipment available Patient identity confirmed: arm band and provided demographic data Time out: Immediately prior to procedure a "time out" was called to verify the correct patient, procedure, equipment, support staff and site/side marked as required.  Sedation type: moderate (conscious) sedation NPO time confirmed and considedered  Sedatives: ETOMIDATE  Physician Time at Bedside: 15 minutes  Vitals: Vital signs were monitored during sedation. Cardiac Monitor, pulse oximeter Patient tolerance: Patient tolerated the procedure well with no immediate complications. Comments: Pt with uneventful recovered. Returned to pre-procedural sedation baseline      Reduction of dislocation Date/Time: 1:30 pm Performed by: Varney Biles Authorized by: Varney Biles Consent: Verbal consent obtained. Risks and benefits: risks, benefits and alternatives were discussed Consent given by: patient Required items: required blood products, implants, devices, and special equipment available Time out: Immediately prior to procedure a "time out" was called to verify the correct patient, procedure, equipment, support staff and site/side marked as required.  Patient sedated: YES  Vitals: Vital signs were  monitored during sedation. Patient tolerance: Patient tolerated the procedure well with no immediate complications. Joint: RIGHT SHOULDER Reduction technique: traction/counter-traction    PATIENT HAS PERSISTENT subluxation. She was placed in a sling. She is neurovascularly intact - besides mild numbness L side, deltoid region.  Pt accidentally given hand ortho f/u - and so i corrected that by speaking with Dr. Alvan Dame, who is on call for ortho, and he is going to have there secretary call the patient and get her a follow up with Dr. Veverly Fells or Dr. Onnie Graham.   I have called patient at her home as well and left a message at 4:50 pm.    Varney Biles, MD 06/23/15 1649

## 2015-06-23 NOTE — ED Notes (Signed)
Dominique Mckee, son, [phone].

## 2015-06-23 NOTE — ED Notes (Signed)
Patient refusing to go to xray until she gets pain medication.  A Nease, RN in to give pain medication.

## 2015-07-05 ENCOUNTER — Emergency Department (HOSPITAL_COMMUNITY): Payer: PPO

## 2015-07-05 ENCOUNTER — Encounter (HOSPITAL_COMMUNITY): Payer: Self-pay | Admitting: *Deleted

## 2015-07-05 ENCOUNTER — Inpatient Hospital Stay (HOSPITAL_COMMUNITY)
Admission: EM | Admit: 2015-07-05 | Discharge: 2015-07-10 | DRG: 562 | Disposition: A | Discharge status: Skilled Nursing Facility | Payer: PPO | Attending: Internal Medicine | Admitting: Internal Medicine

## 2015-07-05 DIAGNOSIS — J9601 Acute respiratory failure with hypoxia: Secondary | ICD-10-CM

## 2015-07-05 DIAGNOSIS — G252 Other specified forms of tremor: Secondary | ICD-10-CM

## 2015-07-05 DIAGNOSIS — Z820 Family history of epilepsy and other diseases of the nervous system: Secondary | ICD-10-CM

## 2015-07-05 DIAGNOSIS — I5031 Acute diastolic (congestive) heart failure: Secondary | ICD-10-CM

## 2015-07-05 DIAGNOSIS — S43004D Unspecified dislocation of right shoulder joint, subsequent encounter: Secondary | ICD-10-CM

## 2015-07-05 DIAGNOSIS — F329 Major depressive disorder, single episode, unspecified: Secondary | ICD-10-CM | POA: Diagnosis present

## 2015-07-05 DIAGNOSIS — I10 Essential (primary) hypertension: Secondary | ICD-10-CM | POA: Diagnosis present

## 2015-07-05 DIAGNOSIS — M797 Fibromyalgia: Secondary | ICD-10-CM | POA: Diagnosis present

## 2015-07-05 DIAGNOSIS — M069 Rheumatoid arthritis, unspecified: Secondary | ICD-10-CM | POA: Diagnosis present

## 2015-07-05 DIAGNOSIS — M24419 Recurrent dislocation, unspecified shoulder: Secondary | ICD-10-CM | POA: Diagnosis present

## 2015-07-05 DIAGNOSIS — Z96653 Presence of artificial knee joint, bilateral: Secondary | ICD-10-CM | POA: Diagnosis present

## 2015-07-05 DIAGNOSIS — M24411 Recurrent dislocation, right shoulder: Principal | ICD-10-CM | POA: Diagnosis present

## 2015-07-05 DIAGNOSIS — S43006A Unspecified dislocation of unspecified shoulder joint, initial encounter: Secondary | ICD-10-CM | POA: Insufficient documentation

## 2015-07-05 DIAGNOSIS — M47812 Spondylosis without myelopathy or radiculopathy, cervical region: Secondary | ICD-10-CM | POA: Diagnosis present

## 2015-07-05 DIAGNOSIS — M47816 Spondylosis without myelopathy or radiculopathy, lumbar region: Secondary | ICD-10-CM | POA: Diagnosis present

## 2015-07-05 DIAGNOSIS — N189 Chronic kidney disease, unspecified: Secondary | ICD-10-CM | POA: Diagnosis present

## 2015-07-05 DIAGNOSIS — I129 Hypertensive chronic kidney disease with stage 1 through stage 4 chronic kidney disease, or unspecified chronic kidney disease: Secondary | ICD-10-CM | POA: Diagnosis present

## 2015-07-05 DIAGNOSIS — G250 Essential tremor: Secondary | ICD-10-CM | POA: Diagnosis present

## 2015-07-05 DIAGNOSIS — Z79899 Other long term (current) drug therapy: Secondary | ICD-10-CM

## 2015-07-05 DIAGNOSIS — K219 Gastro-esophageal reflux disease without esophagitis: Secondary | ICD-10-CM | POA: Diagnosis present

## 2015-07-05 DIAGNOSIS — I959 Hypotension, unspecified: Secondary | ICD-10-CM | POA: Diagnosis not present

## 2015-07-05 DIAGNOSIS — Z419 Encounter for procedure for purposes other than remedying health state, unspecified: Secondary | ICD-10-CM

## 2015-07-05 HISTORY — DX: Major depressive disorder, single episode, unspecified: F32.9

## 2015-07-05 HISTORY — DX: Depression, unspecified: F32.A

## 2015-07-05 LAB — BASIC METABOLIC PANEL
ANION GAP: 9 (ref 5–15)
BUN: 42 mg/dL — AB (ref 6–20)
CHLORIDE: 106 mmol/L (ref 101–111)
CO2: 23 mmol/L (ref 22–32)
Calcium: 9.1 mg/dL (ref 8.9–10.3)
Creatinine, Ser: 1.27 mg/dL — ABNORMAL HIGH (ref 0.44–1.00)
GFR calc Af Amer: 44 mL/min — ABNORMAL LOW (ref >60)
GFR, EST NON AFRICAN AMERICAN: 38 mL/min — AB (ref >60)
Glucose, Bld: 125 mg/dL — ABNORMAL HIGH (ref 65–99)
POTASSIUM: 4.6 mmol/L (ref 3.5–5.1)
SODIUM: 138 mmol/L (ref 135–145)

## 2015-07-05 LAB — I-STAT CHEM 8, ED
BUN: 67 mg/dL — ABNORMAL HIGH (ref 6–20)
CALCIUM ION: 1.07 mmol/L — AB (ref 1.13–1.30)
CHLORIDE: 106 mmol/L (ref 101–111)
Creatinine, Ser: 1.4 mg/dL — ABNORMAL HIGH (ref 0.44–1.00)
Glucose, Bld: 132 mg/dL — ABNORMAL HIGH (ref 65–99)
HEMATOCRIT: 35 % — AB (ref 36.0–46.0)
HEMOGLOBIN: 11.9 g/dL — AB (ref 12.0–15.0)
POTASSIUM: 7.2 mmol/L — AB (ref 3.5–5.1)
SODIUM: 137 mmol/L (ref 135–145)
TCO2: 25 mmol/L (ref 0–100)

## 2015-07-05 LAB — CBC WITH DIFFERENTIAL/PLATELET
BASOS ABS: 0 10*3/uL (ref 0.0–0.1)
Basophils Relative: 0 % (ref 0–1)
EOS ABS: 0.2 10*3/uL (ref 0.0–0.7)
Eosinophils Relative: 1 % (ref 0–5)
HCT: 34.3 % — ABNORMAL LOW (ref 36.0–46.0)
HEMOGLOBIN: 11.1 g/dL — AB (ref 12.0–15.0)
LYMPHS ABS: 1.1 10*3/uL (ref 0.7–4.0)
LYMPHS PCT: 9 % — AB (ref 12–46)
MCH: 32.1 pg (ref 26.0–34.0)
MCHC: 32.4 g/dL (ref 30.0–36.0)
MCV: 99.1 fL (ref 78.0–100.0)
Monocytes Absolute: 0.9 10*3/uL (ref 0.1–1.0)
Monocytes Relative: 8 % (ref 3–12)
NEUTROS PCT: 82 % — AB (ref 43–77)
Neutro Abs: 9.8 10*3/uL — ABNORMAL HIGH (ref 1.7–7.7)
Platelets: 270 10*3/uL (ref 150–400)
RBC: 3.46 MIL/uL — AB (ref 3.87–5.11)
RDW: 15.9 % — ABNORMAL HIGH (ref 11.5–15.5)
WBC: 12 10*3/uL — AB (ref 4.0–10.5)

## 2015-07-05 MED ORDER — LIP MEDEX EX OINT
TOPICAL_OINTMENT | Freq: Once | CUTANEOUS | Status: AC
  Administered 2015-07-05: 22:00:00 via TOPICAL
  Filled 2015-07-05: qty 7

## 2015-07-05 MED ORDER — SODIUM CHLORIDE 0.9 % IV SOLN
1.0000 g | Freq: Once | INTRAVENOUS | Status: AC
  Administered 2015-07-05: 1 g via INTRAVENOUS
  Filled 2015-07-05: qty 10

## 2015-07-05 MED ORDER — FENTANYL CITRATE (PF) 100 MCG/2ML IJ SOLN
100.0000 ug | Freq: Once | INTRAMUSCULAR | Status: AC
  Administered 2015-07-05: 100 ug via INTRAVENOUS
  Filled 2015-07-05: qty 2

## 2015-07-05 MED ORDER — LIDOCAINE HCL (PF) 4 % IJ SOLN
5.0000 mL | Freq: Once | INTRAMUSCULAR | Status: DC
  Filled 2015-07-05: qty 5

## 2015-07-05 MED ORDER — HYDROMORPHONE HCL 1 MG/ML IJ SOLN
1.0000 mg | Freq: Once | INTRAMUSCULAR | Status: AC
  Administered 2015-07-05: 1 mg via INTRAVENOUS
  Filled 2015-07-05: qty 1

## 2015-07-05 NOTE — ED Notes (Signed)
Per [facility], pt has hx of right shoulder dislocation, ortho is reluctant to do surgery d/t hx of OA/RA.  Pt stated "I was supposed to have an MRI @ 1800 tonight.  It was ordered by Dr. Onnie Graham."  [facility] initiated IV site to LAC, 20 g.

## 2015-07-05 NOTE — ED Notes (Signed)
Pt requesting more pain medicine 

## 2015-07-05 NOTE — ED Notes (Signed)
Lidocaine is at pt bedside

## 2015-07-05 NOTE — ED Provider Notes (Signed)
CSN: [id number]     Arrival date & time 07/05/15  1844 History   First MD Initiated Contact with Patient 07/05/15 1852     Chief Complaint  Patient presents with  . Shoulder Pain    right shoulder dislocation     (Consider location/radiation/quality/duration/timing/severity/associated sxs/prior Treatment) Patient is a 81 y.o. female presenting with shoulder pain. The history is provided by the patient.  Shoulder Pain Location:  Shoulder Time since incident:  1 month Shoulder location:  R shoulder Pain details:    Quality:  Sharp   Radiates to:  Does not radiate   Severity:  Severe   Onset quality:  Sudden   Duration:  1 month   Timing:  Intermittent   Progression:  Waxing and waning Chronicity:  Chronic Dislocation: yes   Relieved by:  Nothing Worsened by:  Movement and bearing weight Ineffective treatments:  None tried Associated symptoms: no fever    81 yo F with a chief complaint of right shoulder pain. Patient has had a dislocated shoulder for the past month. May have been even longer than this. Patient was seen here about a week ago I was unable to reduce the shoulder. Patient had follow-up tomorrow with orthopedics. Was supposed to get an MRI today. When trying to put her arm into his shirt she felt a pop in her shoulder and had significant worsening of her pain.  Past Medical History  Diagnosis Date  . Hypertension   . Arthritis   . Degenerative disc disease   . Benign essential tremor   . Migraine headache   . Cervicogenic headache   . Cervical spondylosis   . Lumbar spondylosis   . Gait disorder   . Mild obesity   . GERD (gastroesophageal reflux disease)   . Osteopenia   . Chronic renal insufficiency   . Fibromyalgia   . Rheumatoid arthritis    Past Surgical History  Procedure Laterality Date  . Total knee arthroplasty Bilateral     right  . Abdominal hysterectomy    . Cholecystectomy    . Elbow surgery Left   . Hammer toe surgery Left   .  Tonsillectomy    . Appendectomy     Family History  Problem Relation Age of Onset  . Anesthesia problems Neg Hx   . Hypertension Neg Hx   . Aortic aneurysm Father   . Lung cancer Sister   . Lung cancer Brother   . Alzheimer's disease Brother   . Dementia Brother   . Parkinsonism Brother   . Bone cancer Sister    Social History  Substance Use Topics  . Smoking status: Never Smoker   . Smokeless tobacco: Never Used  . Alcohol Use: No   OB History    No data available     Review of Systems  Constitutional: Negative for fever and chills.  HENT: Negative for congestion and rhinorrhea.   Eyes: Negative for redness and visual disturbance.  Respiratory: Negative for shortness of breath and wheezing.   Cardiovascular: Negative for chest pain and palpitations.  Gastrointestinal: Negative for nausea and vomiting.  Genitourinary: Negative for dysuria and urgency.  Musculoskeletal: Positive for myalgias and arthralgias.  Skin: Negative for pallor and wound.  Neurological: Negative for dizziness and headaches.      Allergies  Feldene and Clinoril  Home Medications   Prior to Admission medications   Medication Sig Start Date End Date Taking? Authorizing Provider  amLODipine (NORVASC) 5 MG tablet Take 5 mg by  mouth every morning.   Yes Historical Provider, MD  benazepril (LOTENSIN) 20 MG tablet Take 20 mg by mouth daily. 06/01/15  Yes Historical Provider, MD  Calcium Carb-Cholecalciferol (CALCIUM 500 +D PO) Take 1 tablet by mouth 2 (two) times daily. Calcium 500 mg & Vitamin D 1000 units   Yes Historical Provider, MD  DULoxetine (CYMBALTA) 30 MG capsule Take 60 mg by mouth daily.  05/14/13  Yes Historical Provider, MD  fentaNYL (DURAGESIC - DOSED MCG/HR) 25 MCG/HR Place 1 patch onto the skin every 3 (three) days.   Yes Historical Provider, MD  ferrous sulfate 325 (65 FE) MG tablet Take 325 mg by mouth every evening.   Yes Historical Provider, MD  folic acid (FOLVITE) 1 MG tablet  Take 3 mg by mouth daily.  12/29/13  Yes Historical Provider, MD  hydrochlorothiazide (HYDRODIURIL) 25 MG tablet Take 12.5 mg by mouth daily.    Yes Historical Provider, MD  hydroxychloroquine (PLAQUENIL) 200 MG tablet Take 400 mg by mouth daily.  02/17/15  Yes Historical Provider, MD  methotrexate 50 MG/2ML injection Inject 0.7 mLs into the muscle every 7 (seven) days. 06/09/15  Yes Historical Provider, MD  Oxycodone HCl 10 MG TABS Take 10 mg by mouth 3 (three) times daily.  05/04/13  Yes Historical Provider, MD  pantoprazole (PROTONIX) 40 MG tablet Take 40 mg by mouth daily. 12/23/13  Yes Historical Provider, MD  propranolol (INDERAL) 60 MG tablet Take 1 tablet (60 mg total) by mouth 2 (two) times daily. 02/28/15  Yes Kathrynn Ducking, MD  diclofenac sodium (VOLTAREN) 1 % GEL Apply 1 application topically 2 (two) times daily as needed (pain).     Historical Provider, MD   BP 130/56 mmHg  Pulse 66  Temp(Src) 97.8 F (36.6 C) (Oral)  Resp 16  SpO2 96% Physical Exam  Constitutional: She is oriented to person, place, and time. She appears well-developed and well-nourished. No distress.  HENT:  Head: Normocephalic and atraumatic.  Eyes: EOM are normal. Pupils are equal, round, and reactive to light.  Neck: Normal range of motion. Neck supple.  Cardiovascular: Normal rate and regular rhythm.  Exam reveals no gallop and no friction rub.   No murmur heard. Pulmonary/Chest: Effort normal. She has no wheezes. She has no rales.  Abdominal: Soft. She exhibits no distension. There is no tenderness. There is no rebound and no guarding.  Musculoskeletal: She exhibits tenderness. She exhibits no edema.  Tender to palpation around the right trapezius muscle as well as about the right shoulder. Pulse motor and sensation intact to the right hand.  Neurological: She is alert and oriented to person, place, and time.  Skin: Skin is warm and dry. She is not diaphoretic.  Psychiatric: She has a normal mood and affect.  Her behavior is normal.    ED Course  Procedures (including critical care time) Labs Review Labs Reviewed  CBC WITH DIFFERENTIAL/PLATELET - Abnormal; Notable for the following:    WBC 12.0 (*)    RBC 3.46 (*)    Hemoglobin 11.1 (*)    HCT 34.3 (*)    RDW 15.9 (*)    Neutrophils Relative % 82 (*)    Neutro Abs 9.8 (*)    Lymphocytes Relative 9 (*)    All other components within normal limits  BASIC METABOLIC PANEL - Abnormal; Notable for the following:    Glucose, Bld 125 (*)    BUN 42 (*)    Creatinine, Ser 1.27 (*)    GFR  calc non Af Amer 38 (*)    GFR calc Af Amer 44 (*)    All other components within normal limits  I-STAT CHEM 8, ED - Abnormal; Notable for the following:    Potassium 7.2 (*)    BUN 67 (*)    Creatinine, Ser 1.40 (*)    Glucose, Bld 132 (*)    Calcium, Ion 1.07 (*)    Hemoglobin 11.9 (*)    HCT 35.0 (*)    All other components within normal limits  BASIC METABOLIC PANEL    Imaging Review Dg Shoulder Right  07/05/2015   CLINICAL DATA:  Pt recently had a right shoulder dislocation and states she has done it again. Pt has severe right shoulder pain,  EXAM: RIGHT SHOULDER - 2+ VIEW  COMPARISON:  None.  FINDINGS: Humeral head positioned abnormally anteriorly and medially as well as inferiorly. No fracture identified. Axillary view could not be performed.  IMPRESSION: Anterior dislocation right humerus   Electronically Signed   By: Skipper Cliche M.D.   On: 07/05/2015 19:40   Dg Chest Port 1 View  07/05/2015   CLINICAL DATA:  81 year old female with hypoxia  EXAM: PORTABLE CHEST - 1 VIEW  COMPARISON:  Chest radiograph dated 11/19/2013  FINDINGS: Single-view of the chest demonstrate increased interstitial and vascular prominence, possibly related to a degree of congestive changes. There is no focal consolidation, pleural effusion, or pneumothorax. Stable cardiac silhouette. There is dislocation of the right shoulder.  IMPRESSION: Diffuse interstitial prominence  possibly mild congestive changes.  Right shoulder dislocation.   Electronically Signed   By: Anner Crete M.D.   On: 07/05/2015 22:28   I have personally reviewed and evaluated these images and lab results as part of my medical decision-making.   EKG Interpretation   Date/Time:  Tuesday July 05 2015 21:24:22 EDT Ventricular Rate:  57 PR Interval:  179 QRS Duration: 172 QT Interval:  462 QTC Calculation: 450 R Axis:   96 Text Interpretation:  Sinus rhythm Atrial premature complex RBBB and LPFB  No significant change since last tracing Confirmed by Diera Wirkkala MD, Quillian Quince  ([id number]) on 07/05/2015 11:21:34 PM      MDM   Final diagnoses:  Shoulder dislocation, right, subsequent encounter    81 yo F with a chief complaint of chronic right shoulder dislocation. Discussed case with Dr. Marlou Sa, ortho, we will not try to reduce shoulder in the ED.  Patient with significant amount of pain unable to do anything at home. Hypoxic on room air not on oxygen at home. Likely secondary to narcotics.  Initial i-STAT with hyperkalemia at 7.2. No noted signs of hyper K on EKG given calcium. Awaiting BMP. BMP loss in the lab had a be redrawn. Repeat BMP with normal K.  chest x-ray with no signs of pneumonia or other cause of hypoxia.  Discussed case again with Dr. Marlou Sa recommends transfer to [facility] can obtain a right shoulder MRI and so he can see her tomorrow in the hospital.  The patients results and plan were reviewed and discussed.   Any x-rays performed were independently reviewed by myself.   Differential diagnosis were considered with the presenting HPI.  Medications  lidocaine (XYLOCAINE) 4 % (PF) injection 5 mL (5 mLs Intradermal Not Given 07/05/15 2153)  fentaNYL (SUBLIMAZE) injection 100 mcg (100 mcg Intravenous Given 07/05/15 2025)  lip balm (CARMEX) ointment ( Topical Given 07/05/15 2139)  calcium gluconate 1 g in sodium chloride 0.9 % 100 mL IVPB (0  g Intravenous Stopped 07/05/15 2316)   HYDROmorphone (DILAUDID) injection 1 mg (1 mg Intravenous Given 07/05/15 2326)    Filed Vitals:   07/05/15 1852 07/05/15 1901  BP: 130/56   Pulse: 66   Temp: 97.8 F (36.6 C)   TempSrc: Oral   Resp: 16   SpO2: 93% 96%    Final diagnoses:  Shoulder dislocation, right, subsequent encounter    Admission/ observation were discussed with the admitting physician, patient and/or family and they are comfortable with the plan.    Deno Etienne, DO 07/05/15 2327

## 2015-07-05 NOTE — ED Notes (Signed)
Bed: IV12 Expected date:  Expected time:  Means of arrival:  Comments: EMS - shoulder

## 2015-07-06 ENCOUNTER — Encounter (HOSPITAL_COMMUNITY): Payer: Self-pay | Admitting: General Practice

## 2015-07-06 ENCOUNTER — Observation Stay (HOSPITAL_COMMUNITY): Payer: PPO

## 2015-07-06 DIAGNOSIS — S43006A Unspecified dislocation of unspecified shoulder joint, initial encounter: Secondary | ICD-10-CM | POA: Insufficient documentation

## 2015-07-06 DIAGNOSIS — M24411 Recurrent dislocation, right shoulder: Principal | ICD-10-CM

## 2015-07-06 DIAGNOSIS — S43004D Unspecified dislocation of right shoulder joint, subsequent encounter: Secondary | ICD-10-CM

## 2015-07-06 DIAGNOSIS — R251 Tremor, unspecified: Secondary | ICD-10-CM

## 2015-07-06 DIAGNOSIS — K219 Gastro-esophageal reflux disease without esophagitis: Secondary | ICD-10-CM | POA: Diagnosis present

## 2015-07-06 DIAGNOSIS — M24419 Recurrent dislocation, unspecified shoulder: Secondary | ICD-10-CM | POA: Diagnosis present

## 2015-07-06 DIAGNOSIS — M069 Rheumatoid arthritis, unspecified: Secondary | ICD-10-CM | POA: Diagnosis present

## 2015-07-06 DIAGNOSIS — I10 Essential (primary) hypertension: Secondary | ICD-10-CM | POA: Diagnosis present

## 2015-07-06 LAB — CBC
HEMATOCRIT: 35.2 % — AB (ref 36.0–46.0)
Hemoglobin: 11 g/dL — ABNORMAL LOW (ref 12.0–15.0)
MCH: 30.7 pg (ref 26.0–34.0)
MCHC: 31.3 g/dL (ref 30.0–36.0)
MCV: 98.3 fL (ref 78.0–100.0)
Platelets: 243 10*3/uL (ref 150–400)
RBC: 3.58 MIL/uL — AB (ref 3.87–5.11)
RDW: 15.3 % (ref 11.5–15.5)
WBC: 11.9 10*3/uL — AB (ref 4.0–10.5)

## 2015-07-06 LAB — CREATININE, SERUM
Creatinine, Ser: 1.32 mg/dL — ABNORMAL HIGH (ref 0.44–1.00)
GFR calc non Af Amer: 36 mL/min — ABNORMAL LOW (ref >60)
GFR, EST AFRICAN AMERICAN: 42 mL/min — AB (ref >60)

## 2015-07-06 MED ORDER — METHOTREXATE SODIUM CHEMO INJECTION 50 MG/2ML
17.5000 mg | INTRAMUSCULAR | Status: DC
  Filled 2015-07-06: qty 0.7

## 2015-07-06 MED ORDER — AMLODIPINE BESYLATE 5 MG PO TABS
5.0000 mg | ORAL_TABLET | Freq: Every morning | ORAL | Status: DC
  Administered 2015-07-06 – 2015-07-10 (×4): 5 mg via ORAL
  Filled 2015-07-06 (×4): qty 1

## 2015-07-06 MED ORDER — HYDROXYCHLOROQUINE SULFATE 200 MG PO TABS
400.0000 mg | ORAL_TABLET | Freq: Every day | ORAL | Status: DC
  Administered 2015-07-07 – 2015-07-10 (×3): 400 mg via ORAL
  Filled 2015-07-06 (×3): qty 2

## 2015-07-06 MED ORDER — ONDANSETRON HCL 4 MG/2ML IJ SOLN: 4.0000 mg | Freq: Four times a day (QID) | INTRAMUSCULAR | Status: DC | PRN

## 2015-07-06 MED ORDER — BENAZEPRIL HCL 20 MG PO TABS
20.0000 mg | ORAL_TABLET | Freq: Every day | ORAL | Status: DC
  Administered 2015-07-06 – 2015-07-07 (×2): 20 mg via ORAL
  Filled 2015-07-06 (×2): qty 1

## 2015-07-06 MED ORDER — OXYCODONE HCL 5 MG PO TABS
10.0000 mg | ORAL_TABLET | Freq: Four times a day (QID) | ORAL | Status: DC | PRN
  Administered 2015-07-06: 10 mg via ORAL
  Filled 2015-07-06 (×2): qty 2

## 2015-07-06 MED ORDER — HYDROMORPHONE HCL 1 MG/ML IJ SOLN
1.0000 mg | Freq: Once | INTRAMUSCULAR | Status: AC
  Administered 2015-07-06: 1 mg via INTRAVENOUS
  Filled 2015-07-06: qty 1

## 2015-07-06 MED ORDER — FERROUS SULFATE 325 (65 FE) MG PO TABS
325.0000 mg | ORAL_TABLET | Freq: Every evening | ORAL | Status: DC
  Administered 2015-07-06 – 2015-07-09 (×4): 325 mg via ORAL
  Filled 2015-07-06 (×4): qty 1

## 2015-07-06 MED ORDER — ONDANSETRON HCL 4 MG PO TABS: 4.0000 mg | ORAL_TABLET | Freq: Four times a day (QID) | ORAL | Status: DC | PRN

## 2015-07-06 MED ORDER — FOLIC ACID 1 MG PO TABS
3.0000 mg | ORAL_TABLET | Freq: Every day | ORAL | Status: DC
  Administered 2015-07-06 – 2015-07-10 (×4): 3 mg via ORAL
  Filled 2015-07-06 (×4): qty 3

## 2015-07-06 MED ORDER — PROPRANOLOL HCL 60 MG PO TABS
60.0000 mg | ORAL_TABLET | Freq: Two times a day (BID) | ORAL | Status: DC
  Administered 2015-07-06 – 2015-07-07 (×3): 60 mg via ORAL
  Filled 2015-07-06 (×4): qty 1

## 2015-07-06 MED ORDER — OXYCODONE HCL 5 MG PO TABS
10.0000 mg | ORAL_TABLET | ORAL | Status: DC | PRN
  Administered 2015-07-06 – 2015-07-10 (×17): 10 mg via ORAL
  Filled 2015-07-06 (×16): qty 2

## 2015-07-06 MED ORDER — HYDROMORPHONE HCL 1 MG/ML IJ SOLN: 0.5000 mg | Freq: Once | INTRAMUSCULAR | Status: DC

## 2015-07-06 MED ORDER — INFLUENZA VAC SPLIT QUAD 0.5 ML IM SUSY
0.5000 mL | PREFILLED_SYRINGE | INTRAMUSCULAR | Status: AC
  Administered 2015-07-10: 0.5 mL via INTRAMUSCULAR
  Filled 2015-07-06: qty 0.5

## 2015-07-06 MED ORDER — ENOXAPARIN SODIUM 40 MG/0.4ML ~~LOC~~ SOLN
40.0000 mg | Freq: Every day | SUBCUTANEOUS | Status: DC
  Administered 2015-07-06: 40 mg via SUBCUTANEOUS
  Filled 2015-07-06: qty 0.4

## 2015-07-06 MED ORDER — DULOXETINE HCL 60 MG PO CPEP
60.0000 mg | ORAL_CAPSULE | Freq: Every day | ORAL | Status: DC
  Administered 2015-07-06 – 2015-07-10 (×4): 60 mg via ORAL
  Filled 2015-07-06 (×4): qty 1

## 2015-07-06 MED ORDER — FENTANYL 25 MCG/HR TD PT72
25.0000 ug | MEDICATED_PATCH | TRANSDERMAL | Status: DC
  Administered 2015-07-06 – 2015-07-09 (×2): 25 ug via TRANSDERMAL
  Filled 2015-07-06 (×3): qty 1

## 2015-07-06 MED ORDER — PANTOPRAZOLE SODIUM 40 MG PO TBEC
40.0000 mg | DELAYED_RELEASE_TABLET | Freq: Every day | ORAL | Status: DC
  Administered 2015-07-06 – 2015-07-10 (×4): 40 mg via ORAL
  Filled 2015-07-06 (×4): qty 1

## 2015-07-06 MED ORDER — HYDROCHLOROTHIAZIDE 25 MG PO TABS
12.5000 mg | ORAL_TABLET | Freq: Every day | ORAL | Status: DC
  Administered 2015-07-06: 12.5 mg via ORAL
  Filled 2015-07-06: qty 1

## 2015-07-06 NOTE — Consult Note (Signed)
Reason for Consult: Right shoulder pain Referring Physician: Dr. Lonna Duval Dominique Mckee is an 81 y.o. female.  HPI: Dominique Mckee is an 81 year old female with right shoulder pain as best I can tell from my interview with her she injured her shoulder approximately 2 weeks ago. Her old records are reviewed and she was seen in the clinic at [facility] no mention was made of shoulder dislocation at that time however the following day the patient developed shoulder pain and was seen by the paramedics and taken to the emergency room where an attempt at shoulder relocation was performed. This was not successful. Since that time she was scheduled for follow-up appointment but was having increasing pain and now presents for further management. Patient had an MRI scan this morning which is reviewed it shows a locked anterior dislocation without injury to the glenoid rim but with impaction on the posterior superior humeral head.  Past Medical History  Diagnosis Date  . Hypertension   . Arthritis   . Degenerative disc disease   . Benign essential tremor   . Migraine headache   . Cervicogenic headache   . Cervical spondylosis   . Lumbar spondylosis   . Gait disorder   . Mild obesity   . GERD (gastroesophageal reflux disease)   . Osteopenia   . Chronic renal insufficiency   . Fibromyalgia   . Rheumatoid arthritis   . Depression     Past Surgical History  Procedure Laterality Date  . Total knee arthroplasty Bilateral     right  . Abdominal hysterectomy    . Cholecystectomy    . Elbow surgery Left   . Hammer toe surgery Left   . Tonsillectomy    . Appendectomy      Family History  Problem Relation Age of Onset  . Anesthesia problems Neg Hx   . Hypertension Neg Hx   . Aortic aneurysm Father   . Lung cancer Sister   . Lung cancer Brother   . Alzheimer's disease Brother   . Dementia Brother   . Parkinsonism Brother   . Bone cancer Sister     Social History:  reports that she has  never smoked. She has never used smokeless tobacco. She reports that she does not drink alcohol or use illicit drugs.  Allergies:  Allergies  Allergen Reactions  . Feldene [Piroxicam]     Kidney issues  . Clinoril [Sulindac] Rash    Medications: I have reviewed the patient's current medications.  Results for orders placed or performed during the hospital encounter of 07/05/15 (from the past 48 hour(s))  CBC with Differential     Status: Abnormal   Collection Time: 07/05/15  9:00 PM  Result Value Ref Range   WBC 12.0 (H) 4.0 - 10.5 K/uL   RBC 3.46 (L) 3.87 - 5.11 MIL/uL   Hemoglobin 11.1 (L) 12.0 - 15.0 g/dL   HCT 34.3 (L) 36.0 - 46.0 %   MCV 99.1 78.0 - 100.0 fL   MCH 32.1 26.0 - 34.0 pg   MCHC 32.4 30.0 - 36.0 g/dL   RDW 15.9 (H) 11.5 - 15.5 %   Platelets 270 150 - 400 K/uL   Neutrophils Relative % 82 (H) 43 - 77 %   Neutro Abs 9.8 (H) 1.7 - 7.7 K/uL   Lymphocytes Relative 9 (L) 12 - 46 %   Lymphs Abs 1.1 0.7 - 4.0 K/uL   Monocytes Relative 8 3 - 12 %   Monocytes Absolute 0.9  0.1 - 1.0 K/uL   Eosinophils Relative 1 0 - 5 %   Eosinophils Absolute 0.2 0.0 - 0.7 K/uL   Basophils Relative 0 0 - 1 %   Basophils Absolute 0.0 0.0 - 0.1 K/uL  I-Stat Chem 8, ED     Status: Abnormal   Collection Time: 07/05/15  9:09 PM  Result Value Ref Range   Sodium 137 135 - 145 mmol/L   Potassium 7.2 (HH) 3.5 - 5.1 mmol/L   Chloride 106 101 - 111 mmol/L   BUN 67 (H) 6 - 20 mg/dL   Creatinine, Ser 1.40 (H) 0.44 - 1.00 mg/dL   Glucose, Bld 132 (H) 65 - 99 mg/dL   Calcium, Ion 1.07 (L) 1.13 - 1.30 mmol/L   TCO2 25 0 - 100 mmol/L   Hemoglobin 11.9 (L) 12.0 - 15.0 g/dL   HCT 35.0 (L) 36.0 - 46.0 %   Comment NOTIFIED PHYSICIAN   Basic metabolic panel     Status: Abnormal   Collection Time: 07/05/15 10:21 PM  Result Value Ref Range   Sodium 138 135 - 145 mmol/L   Potassium 4.6 3.5 - 5.1 mmol/L    Comment: DELTA CHECK NOTED REPEATED TO VERIFY NO VISIBLE HEMOLYSIS I STAT PANEL    Chloride  106 101 - 111 mmol/L   CO2 23 22 - 32 mmol/L   Glucose, Bld 125 (H) 65 - 99 mg/dL   BUN 42 (H) 6 - 20 mg/dL   Creatinine, Ser 1.27 (H) 0.44 - 1.00 mg/dL   Calcium 9.1 8.9 - 10.3 mg/dL   GFR calc non Af Amer 38 (L) >60 mL/min   GFR calc Af Amer 44 (L) >60 mL/min    Comment: (NOTE) The eGFR has been calculated using the CKD EPI equation. This calculation has not been validated in all clinical situations. eGFR's persistently <60 mL/min signify possible Chronic Kidney Disease.    Anion gap 9 5 - 15  CBC     Status: Abnormal   Collection Time: 07/06/15  3:32 AM  Result Value Ref Range   WBC 11.9 (H) 4.0 - 10.5 K/uL   RBC 3.58 (L) 3.87 - 5.11 MIL/uL   Hemoglobin 11.0 (L) 12.0 - 15.0 g/dL   HCT 35.2 (L) 36.0 - 46.0 %   MCV 98.3 78.0 - 100.0 fL   MCH 30.7 26.0 - 34.0 pg   MCHC 31.3 30.0 - 36.0 g/dL   RDW 15.3 11.5 - 15.5 %   Platelets 243 150 - 400 K/uL  Creatinine, serum     Status: Abnormal   Collection Time: 07/06/15  3:32 AM  Result Value Ref Range   Creatinine, Ser 1.32 (H) 0.44 - 1.00 mg/dL   GFR calc non Af Amer 36 (L) >60 mL/min   GFR calc Af Amer 42 (L) >60 mL/min    Comment: (NOTE) The eGFR has been calculated using the CKD EPI equation. This calculation has not been validated in all clinical situations. eGFR's persistently <60 mL/min signify possible Chronic Kidney Disease.     Dg Shoulder Right  07/05/2015   CLINICAL DATA:  Pt recently had a right shoulder dislocation and states she has done it again. Pt has severe right shoulder pain,  EXAM: RIGHT SHOULDER - 2+ VIEW  COMPARISON:  None.  FINDINGS: Humeral head positioned abnormally anteriorly and medially as well as inferiorly. No fracture identified. Axillary view could not be performed.  IMPRESSION: Anterior dislocation right humerus   Electronically Signed   By: Skipper Cliche  M.D.   On: 07/05/2015 19:40   Mr Shoulder Right Wo Contrast  07/06/2015   CLINICAL DATA:  Severe right shoulder pain since anterior  dislocations on 06/23/2015 and 07/05/2015.  EXAM: MRI OF THE RIGHT SHOULDER WITHOUT CONTRAST  TECHNIQUE: Multiplanar, multisequence MR imaging of the shoulder was performed. No intravenous contrast was administered.  COMPARISON:  Radiographs dated 07/05/2015 and 06/23/2015  FINDINGS: Bones: There is anterior dislocation of the right humeral head. There is a deep Hill-Sachs impaction fracture of the posterior lateral aspect of the humeral head. The humeral head its lodged in the anterior inferior aspect of the glenoid. There is no appreciable glenoid rim fracture.  Rotator cuff: There is a full-thickness tear of the inferior fibers of the teres minor muscle as visualized on images 21 through 25 of series 9. The supraspinous and infraspinatus and subscapularis tendons are mostly intact although there is an intrasubstance longitudinal tear in the infraspinatus best seen on images 16 through 19 of series 10. The visualization of the supraspinous tendon is suboptimal due to the distorted anatomy.  Muscles: There is extensive edema in and around the subscapularis muscle and to a lesser degree along the infraspinatus and supraspinous muscles.  Biceps long head: The long head of the biceps tendon is dislocated lateral to the bicipital groove.  Acromioclavicular Joint:  Normal.  Glenohumeral Joint: Large glenohumeral joint effusion.  Labrum:  Not well seen.  No discrete labral tear.  IMPRESSION: Anterior dislocation of the right humeral head. The humeral head is impacted onto the anterior rim of the glenoid with a deep Hill-Sachs lesion.  Full-thickness tear of the inferior fibers of the teres minor muscle.  Strains of all of the muscles of the rotator cuff.   Electronically Signed   By: Lorriane Shire M.D.   On: 07/06/2015 08:49   Dg Chest Port 1 View  07/05/2015   CLINICAL DATA:  81 year old female with hypoxia  EXAM: PORTABLE CHEST - 1 VIEW  COMPARISON:  Chest radiograph dated 11/19/2013  FINDINGS: Single-view of the  chest demonstrate increased interstitial and vascular prominence, possibly related to a degree of congestive changes. There is no focal consolidation, pleural effusion, or pneumothorax. Stable cardiac silhouette. There is dislocation of the right shoulder.  IMPRESSION: Diffuse interstitial prominence possibly mild congestive changes.  Right shoulder dislocation.   Electronically Signed   By: Anner Crete M.D.   On: 07/05/2015 22:28    Review of Systems  Constitutional: Negative.   HENT: Negative.   Eyes: Negative.   Respiratory: Negative.   Cardiovascular: Negative.   Gastrointestinal: Negative.   Genitourinary: Negative.   Musculoskeletal: Positive for joint pain.  Skin: Negative.   Neurological: Positive for dizziness.  Endo/Heme/Allergies: Negative.   Psychiatric/Behavioral: Negative.    Blood pressure 148/63, pulse 70, temperature 98.4 F (36.9 C), temperature source Oral, resp. rate 18, SpO2 91 %. Physical Exam  Constitutional: She appears well-developed.  HENT:  Head: Normocephalic.  Eyes: Pupils are equal, round, and reactive to light.  Neck: Normal range of motion.  Cardiovascular: Normal rate.   Respiratory: Effort normal.  Neurological: She is alert.  Skin: Skin is warm.  Psychiatric: She has a normal mood and affect.   examination of the right arm demonstrates palpable radial pulse intact EPL FPL interosseous function she does have functional biceps and triceps but the strength of them is difficult to assess she does report paresthesias in the hand. I cannot tell if her axillary nerve is functioning. Asked her to try to  contract that muscle but really difficult to say if the axillary nerve is iin fact functioning she has pain with any range of motion of the right shoulder  Assessment/Plan: Impression is 67-week-old right shoulder dislocation plan patient's rotator cuff is intact she has a locked anterior dislocation with intact glenoid rim. Discussed with the patient at  length operative and nonoperative management of this problem and in summary she cannot live with her arm the way it is. Surgical plan also discussed at length with the patient and that includes first an attempt at closed reduction which may or may not be possible. This will be done under general and aesthetic. If that is not successful then the plan would be for an open reduction and assessment of stability. It is possible that during the reduction attempt that fracture could be incurred. That would necessitate change of plan. Patient's bone quality is likely poor. I'm reluctant to perform any type of reverse shoulder replacement until the status of the axillary nerve is better known. I think there is a chance that the size of the defect on the humeral head may require potentially some bone grafting. The risk and benefits of surgical procedure discussed with the patient including but not limited to infection nerve vessel damage redislocation as well as a generally unsatisfactory outcome. Patient understands risk and benefits and agrees to proceed with surgical intervention all questions answered  Dominique Mckee 07/06/2015, 5:38 PM

## 2015-07-06 NOTE — ED Notes (Signed)
Hospitalist at pt bedside.

## 2015-07-06 NOTE — Progress Notes (Signed)
Pt in scanner - will see after scan

## 2015-07-06 NOTE — H&P (Signed)
[facility] History and Physical  Dominique Mckee QMG:[id number] DOB: 12-12-1929 DOA: 07/05/2015  Referring physician: Deno Etienne, DO PCP: Dorian Heckle, MD   Chief Complaint: Shoulder Pain  HPI: Dominique Mckee is a 81 y.o. female with a  past medical history of hypertension, rheumatoid arthritis, benign essential tremor, GERD who comes to the emergency department due to worsening of her dislocated shoulder which she has had for about a month or so. Patient was seen in the emergency department about a week ago by Dr. Tyrone Nine who was unable to reduce the shoulder. The patient had a follow-up with orthopedics and was supposed to get an MRI today, but when trying to put her arm in to her chair she stated that she felt "a pop" in her right shoulder with significant worsening of the pain which prompted her to return to the emergency department today. She states that her pain is under control after the Dilaudid IV push has been given. She is in no acute distress.    Review of Systems:  Constitutional:  No weight loss, night sweats, Fevers, chills, fatigue.  HEENT:  Occasional headaches,  Denies Difficulty swallowing,Tooth/dental problems,Sore throat,  No sneezing, itching, ear ache, nasal congestion, post nasal drip,  Cardio-vascular:  No chest pain, Orthopnea, PND, swelling in lower extremities, anasarca, dizziness, palpitations  GI:  Occasional constipation No heartburn, indigestion, abdominal pain, nausea, vomiting, diarrhea, loss of appetite  Resp:  No shortness of breath with exertion or at rest. No excess mucus, no productive cough, No non-productive cough, No coughing up of blood.No change in color of mucus.No wheezing.No chest wall deformity  Skin:  no rash or lesions.  GU:  Positive urgency or frequency no dysuria, no change in color of urine.  No flank pain.   Musculoskeletal:  Positive pain and decreased range of motion of right shoulder.. Chronic arthralgias and myalgias.  Occasional back pain. Psych:   No memory loss.   Past Medical History  Diagnosis Date  . Hypertension   . Arthritis   . Degenerative disc disease   . Benign essential tremor   . Migraine headache   . Cervicogenic headache   . Cervical spondylosis   . Lumbar spondylosis   . Gait disorder   . Mild obesity   . GERD (gastroesophageal reflux disease)   . Osteopenia   . Chronic renal insufficiency   . Fibromyalgia   . Rheumatoid arthritis    Past Surgical History  Procedure Laterality Date  . Total knee arthroplasty Bilateral     right  . Abdominal hysterectomy    . Cholecystectomy    . Elbow surgery Left   . Hammer toe surgery Left   . Tonsillectomy    . Appendectomy     Social History:  reports that she has never smoked. She has never used smokeless tobacco. She reports that she does not drink alcohol or use illicit drugs.  Allergies  Allergen Reactions  . Feldene [Piroxicam]     Kidney issues  . Clinoril [Sulindac] Rash    Family History  Problem Relation Age of Onset  . Anesthesia problems Neg Hx   . Hypertension Neg Hx   . Aortic aneurysm Father   . Lung cancer Sister   . Lung cancer Brother   . Alzheimer's disease Brother   . Dementia Brother   . Parkinsonism Brother   . Bone cancer Sister     Prior to Admission medications   Medication Sig Start Date End Date Taking?  Authorizing Provider  amLODipine (NORVASC) 5 MG tablet Take 5 mg by mouth every morning.   Yes Historical Provider, MD  benazepril (LOTENSIN) 20 MG tablet Take 20 mg by mouth daily. 06/01/15  Yes Historical Provider, MD  Calcium Carb-Cholecalciferol (CALCIUM 500 +D PO) Take 1 tablet by mouth 2 (two) times daily. Calcium 500 mg & Vitamin D 1000 units   Yes Historical Provider, MD  DULoxetine (CYMBALTA) 30 MG capsule Take 60 mg by mouth daily.  05/14/13  Yes Historical Provider, MD  fentaNYL (DURAGESIC - DOSED MCG/HR) 25 MCG/HR Place 1 patch onto the skin every 3 (three) days.   Yes Historical  Provider, MD  ferrous sulfate 325 (65 FE) MG tablet Take 325 mg by mouth every evening.   Yes Historical Provider, MD  folic acid (FOLVITE) 1 MG tablet Take 3 mg by mouth daily.  12/29/13  Yes Historical Provider, MD  hydrochlorothiazide (HYDRODIURIL) 25 MG tablet Take 12.5 mg by mouth daily.    Yes Historical Provider, MD  hydroxychloroquine (PLAQUENIL) 200 MG tablet Take 400 mg by mouth daily.  02/17/15  Yes Historical Provider, MD  methotrexate 50 MG/2ML injection Inject 0.7 mLs into the muscle every 7 (seven) days. 06/09/15  Yes Historical Provider, MD  Oxycodone HCl 10 MG TABS Take 10 mg by mouth 3 (three) times daily.  05/04/13  Yes Historical Provider, MD  pantoprazole (PROTONIX) 40 MG tablet Take 40 mg by mouth daily. 12/23/13  Yes Historical Provider, MD  propranolol (INDERAL) 60 MG tablet Take 1 tablet (60 mg total) by mouth 2 (two) times daily. 02/28/15  Yes Kathrynn Ducking, MD  diclofenac sodium (VOLTAREN) 1 % GEL Apply 1 application topically 2 (two) times daily as needed (pain).     Historical Provider, MD   Physical Exam: Filed Vitals:   07/05/15 1852 07/05/15 1901  BP: 130/56   Pulse: 66   Temp: 97.8 F (36.6 C)   TempSrc: Oral   Resp: 16   SpO2: 93% 96%    Wt Readings from Last 3 Encounters:  02/28/15 89.449 kg (197 lb 3.2 oz)  08/16/14 86.002 kg (189 lb 9.6 oz)  02/09/14 87.091 kg (192 lb)    General:  Appears calm and comfortable Eyes: PERRL, normal lids, irises & conjunctiva ENT: grossly normal hearing, lips & tongue Neck: no LAD, masses or thyromegaly Cardiovascular: RRR, no m/r/g. No LE edema. Telemetry: SR, no arrhythmias  Respiratory: CTA bilaterally, no w/r/r. Normal respiratory effort. Abdomen: soft, ntnd Skin: no rash or induration seen on limited exam Musculoskeletal: Right upper extremity is immobilized. No pitting edema lower extremities. Psychiatric: grossly normal mood and affect, speech fluent and appropriate Neurologic: grossly non-focal.           Labs on Admission:  Basic Metabolic Panel:  Recent Labs Lab 07/05/15 2109 07/05/15 2221  NA 137 138  K 7.2* 4.6  CL 106 106  CO2  --  23  GLUCOSE 132* 125*  BUN 67* 42*  CREATININE 1.40* 1.27*  CALCIUM  --  9.1   CBC:  Recent Labs Lab 07/05/15 2100 07/05/15 2109  WBC 12.0*  --   NEUTROABS 9.8*  --   HGB 11.1* 11.9*  HCT 34.3* 35.0*  MCV 99.1  --   PLT 270  --     Radiological Exams on Admission: Dg Shoulder Right  07/05/2015   CLINICAL DATA:  Pt recently had a right shoulder dislocation and states she has done it again. Pt has severe right shoulder pain,  EXAM: RIGHT SHOULDER - 2+ VIEW  COMPARISON:  None.  FINDINGS: Humeral head positioned abnormally anteriorly and medially as well as inferiorly. No fracture identified. Axillary view could not be performed.  IMPRESSION: Anterior dislocation right humerus   Electronically Signed   By: Skipper Cliche M.D.   On: 07/05/2015 19:40   Dg Chest Port 1 View  07/05/2015   CLINICAL DATA:  81 year old female with hypoxia  EXAM: PORTABLE CHEST - 1 VIEW  COMPARISON:  Chest radiograph dated 11/19/2013  FINDINGS: Single-view of the chest demonstrate increased interstitial and vascular prominence, possibly related to a degree of congestive changes. There is no focal consolidation, pleural effusion, or pneumothorax. Stable cardiac silhouette. There is dislocation of the right shoulder.  IMPRESSION: Diffuse interstitial prominence possibly mild congestive changes.  Right shoulder dislocation.   Electronically Signed   By: Anner Crete M.D.   On: 07/05/2015 22:28    EKG: Independently reviewed. Vent. rate 57 BPM PR interval 179 ms QRS duration 172 ms QT/QTc 462/450 ms P-R-T axes 57 96 27 Sinus rhythm Atrial premature complex RBBB and LPFB   Assessment/Plan Principal Problem:   Shoulder dislocation, recurrent Admit to [facility] hospital for MRI of the right shoulder and orthopedic surgery evaluation. Pain management as  needed.  Active Problems:   Essential and other specified forms of tremor Continue propranolol 40 mg by mouth twice a day    HTN (hypertension) Continue current antihypertensive therapy. Monitor blood pressure.    Rheumatoid arthritis Continue plaquenil and when necessary analgesics.    GERD (gastroesophageal reflux disease) Continue PPI.  Orthopedic surgery (Dr. Marlou Sa) was consulted by the emergency department.  Code Status: Full code.  DVT Prophylaxis: Lovenox SQ Family Communication: Her granddaughter [name] was present in the room. Disposition Plan: Admit to [facility], for MRI of the right shoulder and orthopedic surgery evaluation.  Time spent: Over 60 minutes.  Reubin Milan [facility] Pager [phone].

## 2015-07-06 NOTE — Progress Notes (Signed)
Consent form filled out and on chart. Patient's daughter [name] [phone] would like to speak with the MD more about the procedure planned for tomorrow first before signing the consent form.

## 2015-07-06 NOTE — Progress Notes (Signed)
Patient seen and examined.  She reports she has had pain in her right shoulder for several years, and a fall with injury approximately one year ago. She denies any recent falls or injuries to the right arm but uses that arm heavily for her cane and walker.  Her MRI demonstrates anterior dislocation of the right humeral head. The humeral head is impacted onto the anterior rim of the glenoid with a deep Hill-Sachs lesion. There is a full-thickness tear of the inferior fibers of the teres minor muscle and there are strains of all of the muscles of the rotator cuff. Orthopedics consultation is pending.  I have emphasized the importance of using oral pain medications for pain control since they last longer. Agree with rest of assessment and plan by my colleague Dr. Olevia Bowens.

## 2015-07-07 ENCOUNTER — Other Ambulatory Visit (HOSPITAL_COMMUNITY): Payer: PPO | Admitting: Orthopedic Surgery

## 2015-07-07 DIAGNOSIS — I5031 Acute diastolic (congestive) heart failure: Secondary | ICD-10-CM | POA: Diagnosis not present

## 2015-07-07 DIAGNOSIS — J9601 Acute respiratory failure with hypoxia: Secondary | ICD-10-CM | POA: Diagnosis not present

## 2015-07-07 DIAGNOSIS — M24411 Recurrent dislocation, right shoulder: Secondary | ICD-10-CM | POA: Diagnosis not present

## 2015-07-07 DIAGNOSIS — I10 Essential (primary) hypertension: Secondary | ICD-10-CM

## 2015-07-07 DIAGNOSIS — R251 Tremor, unspecified: Secondary | ICD-10-CM | POA: Diagnosis not present

## 2015-07-07 DIAGNOSIS — S43004D Unspecified dislocation of right shoulder joint, subsequent encounter: Secondary | ICD-10-CM | POA: Diagnosis not present

## 2015-07-07 LAB — BASIC METABOLIC PANEL
Anion gap: 8 (ref 5–15)
BUN: 26 mg/dL — AB (ref 6–20)
CHLORIDE: 102 mmol/L (ref 101–111)
CO2: 28 mmol/L (ref 22–32)
CREATININE: 1.1 mg/dL — AB (ref 0.44–1.00)
Calcium: 8.7 mg/dL — ABNORMAL LOW (ref 8.9–10.3)
GFR calc Af Amer: 52 mL/min — ABNORMAL LOW (ref >60)
GFR calc non Af Amer: 45 mL/min — ABNORMAL LOW (ref >60)
GLUCOSE: 101 mg/dL — AB (ref 65–99)
Potassium: 4 mmol/L (ref 3.5–5.1)
Sodium: 138 mmol/L (ref 135–145)

## 2015-07-07 LAB — CBC
HCT: 33.1 % — ABNORMAL LOW (ref 36.0–46.0)
Hemoglobin: 10.3 g/dL — ABNORMAL LOW (ref 12.0–15.0)
MCH: 30.9 pg (ref 26.0–34.0)
MCHC: 31.1 g/dL (ref 30.0–36.0)
MCV: 99.4 fL (ref 78.0–100.0)
PLATELETS: 239 10*3/uL (ref 150–400)
RBC: 3.33 MIL/uL — ABNORMAL LOW (ref 3.87–5.11)
RDW: 15.3 % (ref 11.5–15.5)
WBC: 10.7 10*3/uL — ABNORMAL HIGH (ref 4.0–10.5)

## 2015-07-07 LAB — SURGICAL PCR SCREEN
MRSA, PCR: NEGATIVE
Staphylococcus aureus: NEGATIVE

## 2015-07-07 MED ORDER — FUROSEMIDE 10 MG/ML IJ SOLN
20.0000 mg | Freq: Two times a day (BID) | INTRAMUSCULAR | Status: DC
  Administered 2015-07-07: 20 mg via INTRAVENOUS
  Filled 2015-07-07: qty 2

## 2015-07-07 MED ORDER — CETYLPYRIDINIUM CHLORIDE 0.05 % MT LIQD
7.0000 mL | Freq: Two times a day (BID) | OROMUCOSAL | Status: DC
  Administered 2015-07-07 – 2015-07-10 (×7): 7 mL via OROMUCOSAL

## 2015-07-07 MED ORDER — ENOXAPARIN SODIUM 30 MG/0.3ML ~~LOC~~ SOLN: 30.0000 mg | SUBCUTANEOUS | Status: DC

## 2015-07-07 MED ORDER — PROPRANOLOL HCL 20 MG PO TABS
20.0000 mg | ORAL_TABLET | Freq: Two times a day (BID) | ORAL | Status: DC
  Administered 2015-07-07 – 2015-07-09 (×5): 20 mg via ORAL
  Filled 2015-07-07 (×9): qty 1

## 2015-07-07 MED ORDER — DOCUSATE SODIUM 100 MG PO CAPS
100.0000 mg | ORAL_CAPSULE | Freq: Two times a day (BID) | ORAL | Status: DC
Start: 2015-07-07 — End: 2015-07-10
  Administered 2015-07-07 – 2015-07-10 (×6): 100 mg via ORAL
  Filled 2015-07-07 (×6): qty 1

## 2015-07-07 MED ORDER — SENNA 8.6 MG PO TABS
2.0000 | ORAL_TABLET | Freq: Every day | ORAL | Status: DC
  Administered 2015-07-07 – 2015-07-09 (×3): 17.2 mg via ORAL
  Filled 2015-07-07 (×3): qty 2

## 2015-07-07 MED ORDER — ENOXAPARIN SODIUM 30 MG/0.3ML ~~LOC~~ SOLN
30.0000 mg | Freq: Once | SUBCUTANEOUS | Status: AC
  Administered 2015-07-07: 30 mg via SUBCUTANEOUS
  Filled 2015-07-07: qty 0.3

## 2015-07-07 MED ORDER — POLYETHYLENE GLYCOL 3350 17 G PO PACK: 17.0000 g | PACK | Freq: Every day | ORAL | Status: DC | PRN

## 2015-07-07 NOTE — Evaluation (Signed)
Physical Therapy Evaluation Patient Details Name: Dominique Mckee MRN: [id number] DOB: 1930-07-22 Today's Date: 07/07/2015   History of Present Illness  81 y.o. female with a past medical history of hypertension, rheumatoid arthritis, benign essential tremor, GERD who presented to the emergency department with worsening pain in her right shoulder. Patient had been seen in the emergency department about a week prior to admission by Dr. Tyrone Nine who was unable to reduce the shoulder. The patient reports that she was later attempting to pull up her pants when her shoulder popped out again. She then went to the emergency department for further treatment.   Clinical Impression  Patient lives alone and does not have adequate support to safely discharge home. Recommending skilled nursing facility for post-acute therapy needs.      Follow Up Recommendations SNF;Supervision/Assistance - 24 hour    Equipment Recommendations  Other (comment) (to be assessed at next level of care)    Recommendations for Other Services       Precautions / Restrictions Precautions Precautions: Shoulder;Other (comment) (shoulder currently dislocated (right)) Shoulder Interventions: Shoulder sling/immobilizer;At all times Required Braces or Orthoses: Sling Restrictions Weight Bearing Restrictions: Yes RUE Weight Bearing: Non weight bearing      Mobility  Bed Mobility Overal bed mobility: Needs Assistance Bed Mobility: Sit to Supine       Sit to supine: +2 for physical assistance;Mod assist;Max assist (max assist LEs, mod assist at trunk)      Transfers Overall transfer level: Needs assistance Equipment used: None;1 person hand held assist Transfers: Sit to/from Stand Sit to Stand: Mod assist         General transfer comment: cues to use LUE to push from chair. HHA and close contact guard with ambulation and transfers.   Ambulation/Gait Ambulation/Gait assistance: Min assist Ambulation Distance  (Feet): 4 Feet Assistive device: 1 person hand held assist Gait Pattern/deviations: Step-to pattern Gait velocity: very slow   General Gait Details: slow guarded steps. Patient reports that she has inserts in her regular shoes for her feet which usually help. Will have family bring in.   Stairs            Wheelchair Mobility    Modified Rankin (Stroke Patients Only)       Balance Overall balance assessment: Needs assistance Sitting-balance support: No upper extremity supported Sitting balance-Leahy Scale: Fair     Standing balance support: Single extremity supported Standing balance-Leahy Scale: Poor Standing balance comment: needing hand held assistance                             Pertinent Vitals/Pain Pain Assessment: 0-10 Pain Score: 5  Pain Location: Rt arm and shoulder Pain Descriptors / Indicators: Aching Pain Intervention(s): Monitored during session;Limited activity within patient's tolerance;Repositioned    Home Living Family/patient expects to be discharged to:: Skilled nursing facility Living Arrangements: Alone               Additional Comments: Patient states that she does not feel like she will be able to go immediatly home. Anticipating needing further rehabilitation before going home.     Prior Function Level of Independence: Independent with assistive device(s)         Comments: previously using rw     Hand Dominance   Dominant Hand: Right    Extremity/Trunk Assessment               Lower Extremity Assessment: Generalized weakness  Communication   Communication: No difficulties  Cognition Arousal/Alertness: Awake/alert Behavior During Therapy: WFL for tasks assessed/performed Overall Cognitive Status: Within Functional Limits for tasks assessed                      General Comments General comments (skin integrity, edema, etc.): Patient trensferred from chair-BSC-bed.     Exercises         Assessment/Plan    PT Assessment Patient needs continued PT services  PT Diagnosis Difficulty walking;Generalized weakness;Acute pain   PT Problem List Decreased strength;Decreased range of motion;Decreased activity tolerance;Decreased balance;Decreased mobility;Decreased knowledge of use of DME;Pain  PT Treatment Interventions DME instruction;Gait training;Stair training;Functional mobility training;Therapeutic activities;Therapeutic exercise;Balance training;Patient/family education   PT Goals (Current goals can be found in the Care Plan section) Acute Rehab PT Goals Patient Stated Goal: go for more more rehab after surgery PT Goal Formulation: With patient Time For Goal Achievement: 07/21/15 Potential to Achieve Goals: Good    Frequency Min 3X/week   Barriers to discharge Decreased caregiver support (lives alone)      Co-evaluation               End of Session Equipment Utilized During Treatment: Gait belt Activity Tolerance: Patient limited by pain;Patient limited by fatigue Patient left: in bed;with call bell/phone within reach;with family/visitor present Nurse Communication: Mobility status    Functional Assessment Tool Used: clinical judgment Functional Limitation: Mobility: Walking and moving around Mobility: Walking and Moving Around Current Status (T6606): At least 60 percent but less than 80 percent impaired, limited or restricted Mobility: Walking and Moving Around Goal Status [phone]): At least 40 percent but less than 60 percent impaired, limited or restricted    Time: 9774-1423 PT Time Calculation (min) (ACUTE ONLY): 32 min   Charges:   PT Evaluation $Initial PT Evaluation Tier I: 1 Procedure PT Treatments $Therapeutic Activity: 8-22 mins   PT G Codes:   PT G-Codes **NOT FOR INPATIENT CLASS** Functional Assessment Tool Used: clinical judgment Functional Limitation: Mobility: Walking and moving around Mobility: Walking and Moving Around Current  Status (T5320): At least 60 percent but less than 80 percent impaired, limited or restricted Mobility: Walking and Moving Around Goal Status [phone]): At least 40 percent but less than 60 percent impaired, limited or restricted    Cassell Clement, PT, CSCS Pager [phone] Office [phone]  07/07/2015, 4:10 PM

## 2015-07-07 NOTE — Progress Notes (Signed)
[facility] PROGRESS NOTE  Dominique Mckee PXT:[id number] DOB: July 26, 1930 DOA: 07/05/2015 PCP: Dominique Heckle, MD  Brief Summary  Dominique Mckee is a 81 y.o. female with a past medical history of hypertension, rheumatoid arthritis, benign essential tremor, GERD who presented to the emergency department with worsening pain in her right shoulder.  Patient had been seen in the emergency department about a week prior to admission by Dr. Tyrone Mckee who was unable to reduce the shoulder. When trying to put her arm in to her chair she stated that she felt "a pop" in her right shoulder with significant worsening of the pain which prompted her to return to the emergency department.   Assessment/Plan  Shoulder dislocation, recurrent -  Appreciate orthopedic surgery assistance -  Continue oral pain medication with IV for breakthrough only -  Start stool softeners and laxatives to prevent constipation  Essential tremor -  Continue propranolol 40 mg by mouth twice a day  Acute hypoxic respiratory failure secondary to probable acute diastolic heart failure given vascular congestion seen on initial CXR -  Strict I/O -  Daily weights -  Lasix 20mg  IV BID -  D/c HCTZ -  Wean oxygen as tolerated  HTN (hypertension), blood pressure stable -  Continue norvasc, propranolol  Rheumatoid arthritis, stable -  Continue plaquenil   GERD (gastroesophageal reflux disease), stable, continue PPI  Diet:  NPO for procedure (although may be deferred until tomorrow according to RN) Access:  PIV IVF:  off Proph:  lovenox  Code Status: full Family Communication: patient and her granddaughter Disposition Plan: pending correction of her shoudler   Consultants:  Orthopedics, Dr. Marlou Mckee  Procedures:  MRI right shoulder on 9/7:   anterior dislocation of the right humeral head. The humeral head is impacted onto the anterior rim of the glenoid with a deep Hill-Sachs lesion. There is a full-thickness tear of the  inferior fibers of the teres minor muscle and there are strains of all of the muscles of the rotator cuff.  Antibiotics:  none   HPI/Subjective:  Feeling well, on 3L [place], however.  Oral pain medication working better for her shoulder pain.    Objective: Filed Vitals:   07/06/15 0552 07/06/15 1447 07/06/15 2050 07/07/15 0422  BP: 143/61 148/63 146/56 130/67  Pulse: 61 70 78 65  Temp: 98.2 F (36.8 C) 98.4 F (36.9 C) 98.3 F (36.8 C) 98.3 F (36.8 C)  TempSrc: Oral Oral Oral Oral  Resp: 18 18 18 18   SpO2: 96% 91% 95% 97%    Intake/Output Summary (Last 24 hours) at 07/07/15 0831 Last data filed at 07/07/15 0424  Gross per 24 hour  Intake    120 ml  Output      6 ml  Net    114 ml   There were no vitals filed for this visit. There is no weight on file to calculate BMI.  Exam:   General:  Adult female, No acute distress, nasal canula in place  HEENT:  NCAT, MMM  Cardiovascular:  RRR, nl S1, S2, 2/6 systolic murmur, 2+ pulses, warm extremities  Respiratory:  Wheezes and dependent rales, no rhonchi, no increased WOB  Abdomen:   NABS, soft, NT/ND  MSK:   Normal tone and bulk, no LEE  Neuro:  Grossly intact  Data Reviewed: Basic Metabolic Panel:  Recent Labs Lab 07/05/15 2109 07/05/15 2221 07/06/15 0332 07/07/15 0515  NA 137 138  --  138  K 7.2* 4.6  --  4.0  CL 106 106  --  102  CO2  --  23  --  28  GLUCOSE 132* 125*  --  101*  BUN 67* 42*  --  26*  CREATININE 1.40* 1.27* 1.32* 1.10*  CALCIUM  --  9.1  --  8.7*   Liver Function Tests: No results for input(s): AST, ALT, ALKPHOS, BILITOT, PROT, ALBUMIN in the last 168 hours. No results for input(s): LIPASE, AMYLASE in the last 168 hours. No results for input(s): AMMONIA in the last 168 hours. CBC:  Recent Labs Lab 07/05/15 2100 07/05/15 2109 07/06/15 0332 07/07/15 0515  WBC 12.0*  --  11.9* 10.7*  NEUTROABS 9.8*  --   --   --   HGB 11.1* 11.9* 11.0* 10.3*  HCT 34.3* 35.0* 35.2* 33.1*  MCV  99.1  --  98.3 99.4  PLT 270  --  243 239    No results found for this or any previous visit (from the past 240 hour(s)).   Studies: Dg Shoulder Right  07/05/2015   CLINICAL DATA:  Pt recently had a right shoulder dislocation and states she has done it again. Pt has severe right shoulder pain,  EXAM: RIGHT SHOULDER - 2+ VIEW  COMPARISON:  None.  FINDINGS: Humeral head positioned abnormally anteriorly and medially as well as inferiorly. No fracture identified. Axillary view could not be performed.  IMPRESSION: Anterior dislocation right humerus   Electronically Signed   By: Skipper Cliche M.D.   On: 07/05/2015 19:40   Mr Shoulder Right Wo Contrast  07/06/2015   CLINICAL DATA:  Severe right shoulder pain since anterior dislocations on 06/23/2015 and 07/05/2015.  EXAM: MRI OF THE RIGHT SHOULDER WITHOUT CONTRAST  TECHNIQUE: Multiplanar, multisequence MR imaging of the shoulder was performed. No intravenous contrast was administered.  COMPARISON:  Radiographs dated 07/05/2015 and 06/23/2015  FINDINGS: Bones: There is anterior dislocation of the right humeral head. There is a deep Hill-Sachs impaction fracture of the posterior lateral aspect of the humeral head. The humeral head its lodged in the anterior inferior aspect of the glenoid. There is no appreciable glenoid rim fracture.  Rotator cuff: There is a full-thickness tear of the inferior fibers of the teres minor muscle as visualized on images 21 through 25 of series 9. The supraspinous and infraspinatus and subscapularis tendons are mostly intact although there is an intrasubstance longitudinal tear in the infraspinatus best seen on images 16 through 19 of series 10. The visualization of the supraspinous tendon is suboptimal due to the distorted anatomy.  Muscles: There is extensive edema in and around the subscapularis muscle and to a lesser degree along the infraspinatus and supraspinous muscles.  Biceps long head: The long head of the biceps tendon is  dislocated lateral to the bicipital groove.  Acromioclavicular Joint:  Normal.  Glenohumeral Joint: Large glenohumeral joint effusion.  Labrum:  Not well seen.  No discrete labral tear.  IMPRESSION: Anterior dislocation of the right humeral head. The humeral head is impacted onto the anterior rim of the glenoid with a deep Hill-Sachs lesion.  Full-thickness tear of the inferior fibers of the teres minor muscle.  Strains of all of the muscles of the rotator cuff.   Electronically Signed   By: Lorriane Shire M.D.   On: 07/06/2015 08:49   Dg Chest Port 1 View  07/05/2015   CLINICAL DATA:  81 year old female with hypoxia  EXAM: PORTABLE CHEST - 1 VIEW  COMPARISON:  Chest radiograph dated 11/19/2013  FINDINGS: Single-view of the  chest demonstrate increased interstitial and vascular prominence, possibly related to a degree of congestive changes. There is no focal consolidation, pleural effusion, or pneumothorax. Stable cardiac silhouette. There is dislocation of the right shoulder.  IMPRESSION: Diffuse interstitial prominence possibly mild congestive changes.  Right shoulder dislocation.   Electronically Signed   By: Anner Crete M.D.   On: 07/05/2015 22:28    Scheduled Meds: . amLODipine  5 mg Oral q morning - 10a  . benazepril  20 mg Oral Daily  . DULoxetine  60 mg Oral Daily  . enoxaparin (LOVENOX) injection  30 mg Subcutaneous Once  . fentaNYL  25 mcg Transdermal Q72H  . ferrous sulfate  325 mg Oral QPM  . folic acid  3 mg Oral Daily  . hydrochlorothiazide  12.5 mg Oral Daily  . hydroxychloroquine  400 mg Oral Daily  . Influenza vac split quadrivalent PF  0.5 mL Intramuscular Tomorrow-1000  . lidocaine  5 mL Intradermal Once  . [START ON 07/08/2015] methotrexate  17.5 mg Intramuscular Q Fri  . pantoprazole  40 mg Oral Daily  . propranolol  60 mg Oral BID   Continuous Infusions:   Principal Problem:   Shoulder dislocation, recurrent Active Problems:   Essential and other specified forms of  tremor   HTN (hypertension)   Rheumatoid arthritis   GERD (gastroesophageal reflux disease)   Shoulder dislocation    Time spent: 30 min    Quentyn Kolbeck, [facility] Pager [phone]. If 7PM-7AM, please contact night-coverage at [email or web address], password [facility] 07/07/2015, 8:31 AM

## 2015-07-08 ENCOUNTER — Observation Stay (HOSPITAL_COMMUNITY): Payer: PPO | Admitting: Certified Registered Nurse Anesthetist

## 2015-07-08 ENCOUNTER — Encounter (HOSPITAL_COMMUNITY)
Admission: EM | Disposition: A | Discharge status: Skilled Nursing Facility | Payer: Self-pay | Source: Home / Self Care | Attending: Internal Medicine

## 2015-07-08 ENCOUNTER — Inpatient Hospital Stay (HOSPITAL_COMMUNITY): Payer: PPO

## 2015-07-08 DIAGNOSIS — Z96653 Presence of artificial knee joint, bilateral: Secondary | ICD-10-CM | POA: Diagnosis present

## 2015-07-08 DIAGNOSIS — M47816 Spondylosis without myelopathy or radiculopathy, lumbar region: Secondary | ICD-10-CM | POA: Diagnosis present

## 2015-07-08 DIAGNOSIS — M47812 Spondylosis without myelopathy or radiculopathy, cervical region: Secondary | ICD-10-CM | POA: Diagnosis present

## 2015-07-08 DIAGNOSIS — M797 Fibromyalgia: Secondary | ICD-10-CM | POA: Diagnosis present

## 2015-07-08 DIAGNOSIS — I509 Heart failure, unspecified: Secondary | ICD-10-CM

## 2015-07-08 DIAGNOSIS — F329 Major depressive disorder, single episode, unspecified: Secondary | ICD-10-CM | POA: Diagnosis present

## 2015-07-08 DIAGNOSIS — M069 Rheumatoid arthritis, unspecified: Secondary | ICD-10-CM | POA: Diagnosis present

## 2015-07-08 DIAGNOSIS — J9601 Acute respiratory failure with hypoxia: Secondary | ICD-10-CM | POA: Diagnosis not present

## 2015-07-08 DIAGNOSIS — N189 Chronic kidney disease, unspecified: Secondary | ICD-10-CM | POA: Diagnosis present

## 2015-07-08 DIAGNOSIS — R251 Tremor, unspecified: Secondary | ICD-10-CM | POA: Diagnosis not present

## 2015-07-08 DIAGNOSIS — I959 Hypotension, unspecified: Secondary | ICD-10-CM | POA: Diagnosis not present

## 2015-07-08 DIAGNOSIS — Z820 Family history of epilepsy and other diseases of the nervous system: Secondary | ICD-10-CM | POA: Diagnosis not present

## 2015-07-08 DIAGNOSIS — G250 Essential tremor: Secondary | ICD-10-CM | POA: Diagnosis present

## 2015-07-08 DIAGNOSIS — K219 Gastro-esophageal reflux disease without esophagitis: Secondary | ICD-10-CM | POA: Diagnosis present

## 2015-07-08 DIAGNOSIS — I5031 Acute diastolic (congestive) heart failure: Secondary | ICD-10-CM

## 2015-07-08 DIAGNOSIS — M24411 Recurrent dislocation, right shoulder: Secondary | ICD-10-CM | POA: Diagnosis present

## 2015-07-08 DIAGNOSIS — I129 Hypertensive chronic kidney disease with stage 1 through stage 4 chronic kidney disease, or unspecified chronic kidney disease: Secondary | ICD-10-CM | POA: Diagnosis present

## 2015-07-08 DIAGNOSIS — Z79899 Other long term (current) drug therapy: Secondary | ICD-10-CM | POA: Diagnosis not present

## 2015-07-08 HISTORY — PX: SHOULDER CLOSED REDUCTION: SHX1051

## 2015-07-08 LAB — CBC
HCT: 31.7 % — ABNORMAL LOW (ref 36.0–46.0)
Hemoglobin: 10.2 g/dL — ABNORMAL LOW (ref 12.0–15.0)
MCH: 31.9 pg (ref 26.0–34.0)
MCHC: 32.2 g/dL (ref 30.0–36.0)
MCV: 99.1 fL (ref 78.0–100.0)
PLATELETS: 201 10*3/uL (ref 150–400)
RBC: 3.2 MIL/uL — AB (ref 3.87–5.11)
RDW: 15.1 % (ref 11.5–15.5)
WBC: 10.7 10*3/uL — AB (ref 4.0–10.5)

## 2015-07-08 LAB — BASIC METABOLIC PANEL
Anion gap: 10 (ref 5–15)
BUN: 37 mg/dL — ABNORMAL HIGH (ref 6–20)
CALCIUM: 8.2 mg/dL — AB (ref 8.9–10.3)
CO2: 28 mmol/L (ref 22–32)
CREATININE: 1.55 mg/dL — AB (ref 0.44–1.00)
Chloride: 99 mmol/L — ABNORMAL LOW (ref 101–111)
GFR calc non Af Amer: 30 mL/min — ABNORMAL LOW (ref >60)
GFR, EST AFRICAN AMERICAN: 35 mL/min — AB (ref >60)
Glucose, Bld: 94 mg/dL (ref 65–99)
Potassium: 3.8 mmol/L (ref 3.5–5.1)
SODIUM: 137 mmol/L (ref 135–145)

## 2015-07-08 LAB — TYPE AND SCREEN
ABO/RH(D): O NEG
ANTIBODY SCREEN: NEGATIVE

## 2015-07-08 SURGERY — CLOSED REDUCTION, SHOULDER
Anesthesia: Regional | Site: Shoulder | Laterality: Right

## 2015-07-08 MED ORDER — SODIUM CHLORIDE 0.9 % IV SOLN
INTRAVENOUS | Status: DC
  Administered 2015-07-08 (×3): via INTRAVENOUS

## 2015-07-08 MED ORDER — ONDANSETRON HCL 4 MG/2ML IJ SOLN: 4.0000 mg | Freq: Four times a day (QID) | INTRAMUSCULAR | Status: DC | PRN

## 2015-07-08 MED ORDER — ACETAMINOPHEN 325 MG PO TABS: 650.0000 mg | ORAL_TABLET | Freq: Four times a day (QID) | ORAL | Status: DC | PRN

## 2015-07-08 MED ORDER — ONDANSETRON HCL 4 MG/2ML IJ SOLN
INTRAMUSCULAR | Status: AC
  Filled 2015-07-08: qty 2

## 2015-07-08 MED ORDER — METOCLOPRAMIDE HCL 5 MG PO TABS: 5.0000 mg | ORAL_TABLET | Freq: Three times a day (TID) | ORAL | Status: DC | PRN

## 2015-07-08 MED ORDER — HYDROMORPHONE HCL 1 MG/ML IJ SOLN: 0.2500 mg | INTRAMUSCULAR | Status: DC | PRN

## 2015-07-08 MED ORDER — LACTATED RINGERS IV SOLN: INTRAVENOUS | Status: DC | PRN

## 2015-07-08 MED ORDER — METOCLOPRAMIDE HCL 5 MG/ML IJ SOLN: 5.0000 mg | Freq: Three times a day (TID) | INTRAMUSCULAR | Status: DC | PRN

## 2015-07-08 MED ORDER — ACETAMINOPHEN 650 MG RE SUPP: 650.0000 mg | Freq: Four times a day (QID) | RECTAL | Status: DC | PRN

## 2015-07-08 MED ORDER — PROMETHAZINE HCL 25 MG/ML IJ SOLN: 6.2500 mg | INTRAMUSCULAR | Status: DC | PRN

## 2015-07-08 MED ORDER — ROCURONIUM BROMIDE 100 MG/10ML IV SOLN
INTRAVENOUS | Status: DC | PRN
  Administered 2015-07-08: 50 mg via INTRAVENOUS

## 2015-07-08 MED ORDER — FENTANYL CITRATE (PF) 250 MCG/5ML IJ SOLN
INTRAMUSCULAR | Status: AC
  Filled 2015-07-08: qty 5

## 2015-07-08 MED ORDER — FENTANYL CITRATE (PF) 100 MCG/2ML IJ SOLN
100.0000 ug | Freq: Once | INTRAMUSCULAR | Status: AC
  Administered 2015-07-08: 50 ug via INTRAVENOUS

## 2015-07-08 MED ORDER — ENOXAPARIN SODIUM 30 MG/0.3ML ~~LOC~~ SOLN
30.0000 mg | SUBCUTANEOUS | Status: DC
  Administered 2015-07-09 – 2015-07-10 (×2): 30 mg via SUBCUTANEOUS
  Filled 2015-07-08 (×2): qty 0.3

## 2015-07-08 MED ORDER — MEPERIDINE HCL 25 MG/ML IJ SOLN: 6.2500 mg | INTRAMUSCULAR | Status: DC | PRN

## 2015-07-08 MED ORDER — PROPOFOL 10 MG/ML IV BOLUS
INTRAVENOUS | Status: DC | PRN
  Administered 2015-07-08: 150 mg via INTRAVENOUS

## 2015-07-08 MED ORDER — ARTIFICIAL TEARS OP OINT
TOPICAL_OINTMENT | OPHTHALMIC | Status: AC
  Filled 2015-07-08: qty 3.5

## 2015-07-08 MED ORDER — SUGAMMADEX SODIUM 500 MG/5ML IV SOLN
INTRAVENOUS | Status: AC
  Filled 2015-07-08: qty 5

## 2015-07-08 MED ORDER — SUGAMMADEX SODIUM 500 MG/5ML IV SOLN
INTRAVENOUS | Status: DC | PRN
  Administered 2015-07-08: 350 mg via INTRAVENOUS

## 2015-07-08 MED ORDER — EPHEDRINE SULFATE 50 MG/ML IJ SOLN
INTRAMUSCULAR | Status: DC | PRN
  Administered 2015-07-08: 10 mg via INTRAVENOUS
  Administered 2015-07-08: 5 mg via INTRAVENOUS

## 2015-07-08 MED ORDER — CEFAZOLIN SODIUM-DEXTROSE 2-3 GM-% IV SOLR
INTRAVENOUS | Status: DC | PRN
  Administered 2015-07-08: 2 g via INTRAVENOUS

## 2015-07-08 MED ORDER — FENTANYL CITRATE (PF) 100 MCG/2ML IJ SOLN
INTRAMUSCULAR | Status: AC
  Administered 2015-07-08: 50 ug via INTRAVENOUS
  Filled 2015-07-08: qty 2

## 2015-07-08 MED ORDER — LIDOCAINE HCL (CARDIAC) 20 MG/ML IV SOLN
INTRAVENOUS | Status: DC | PRN
Start: 2015-07-08 — End: 2015-07-08
  Administered 2015-07-08: 60 mg via INTRAVENOUS

## 2015-07-08 MED ORDER — ONDANSETRON HCL 4 MG PO TABS: 4.0000 mg | ORAL_TABLET | Freq: Four times a day (QID) | ORAL | Status: DC | PRN

## 2015-07-08 MED ORDER — ONDANSETRON HCL 4 MG/2ML IJ SOLN
INTRAMUSCULAR | Status: DC | PRN
  Administered 2015-07-08: 4 mg via INTRAVENOUS

## 2015-07-08 MED ORDER — METHOTREXATE SODIUM CHEMO INJECTION 50 MG/2ML
17.5000 mg | INTRAMUSCULAR | Status: DC
  Filled 2015-07-08: qty 0.7

## 2015-07-08 MED ORDER — BUPIVACAINE-EPINEPHRINE (PF) 0.5% -1:200000 IJ SOLN
INTRAMUSCULAR | Status: DC | PRN
  Administered 2015-07-08: 30 mL via PERINEURAL

## 2015-07-08 SURGICAL SUPPLY — 45 items
BANDAGE ELASTIC 4 VELCRO ST LF (GAUZE/BANDAGES/DRESSINGS) IMPLANT
BANDAGE ELASTIC 6 VELCRO ST LF (GAUZE/BANDAGES/DRESSINGS) IMPLANT
BENZOIN TINCTURE PRP APPL 2/3 (GAUZE/BANDAGES/DRESSINGS) IMPLANT
BNDG COHESIVE 4X5 TAN STRL (GAUZE/BANDAGES/DRESSINGS) IMPLANT
COVER SURGICAL LIGHT HANDLE (MISCELLANEOUS) IMPLANT
DRAIN PENROSE 1/2X12 LTX STRL (WOUND CARE) IMPLANT
DRAPE C-ARM 42X72 X-RAY (DRAPES) IMPLANT
DRAPE IMP U-DRAPE 54X76 (DRAPES) IMPLANT
DRAPE U-SHAPE 47X51 STRL (DRAPES) IMPLANT
DRSG PAD ABDOMINAL 8X10 ST (GAUZE/BANDAGES/DRESSINGS) IMPLANT
DURAPREP 26ML APPLICATOR (WOUND CARE) IMPLANT
ELECT REM PT RETURN 9FT ADLT (ELECTROSURGICAL)
ELECTRODE REM PT RTRN 9FT ADLT (ELECTROSURGICAL) IMPLANT
FACESHIELD WRAPAROUND (MASK) IMPLANT
GAUZE SPONGE 4X4 12PLY STRL (GAUZE/BANDAGES/DRESSINGS) IMPLANT
GAUZE XEROFORM 5X9 LF (GAUZE/BANDAGES/DRESSINGS) IMPLANT
GLOVE BIOGEL PI IND STRL 8 (GLOVE) IMPLANT
GLOVE BIOGEL PI INDICATOR 8 (GLOVE)
GLOVE SURG ORTHO 8.0 STRL STRW (GLOVE) IMPLANT
GOWN STRL REUS W/ TWL LRG LVL3 (GOWN DISPOSABLE) IMPLANT
GOWN STRL REUS W/ TWL XL LVL3 (GOWN DISPOSABLE) IMPLANT
GOWN STRL REUS W/TWL LRG LVL3 (GOWN DISPOSABLE)
GOWN STRL REUS W/TWL XL LVL3 (GOWN DISPOSABLE)
KIT BASIN OR (CUSTOM PROCEDURE TRAY) IMPLANT
KIT ROOM TURNOVER OR (KITS) ×4 IMPLANT
MANIFOLD NEPTUNE II (INSTRUMENTS) IMPLANT
NEEDLE 21X1 OR PACK (NEEDLE) IMPLANT
NS IRRIG 1000ML POUR BTL (IV SOLUTION) IMPLANT
PACK SHOULDER (CUSTOM PROCEDURE TRAY) IMPLANT
PACK UNIVERSAL I (CUSTOM PROCEDURE TRAY) IMPLANT
PAD ARMBOARD 7.5X6 YLW CONV (MISCELLANEOUS) IMPLANT
PAD CAST 4YDX4 CTTN HI CHSV (CAST SUPPLIES) IMPLANT
PADDING CAST COTTON 4X4 STRL (CAST SUPPLIES)
PENCIL BUTTON HOLSTER BLD 10FT (ELECTRODE) IMPLANT
SPONGE LAP 4X18 X RAY DECT (DISPOSABLE) IMPLANT
STAPLER VISISTAT 35W (STAPLE) IMPLANT
STOCKINETTE IMPERVIOUS 9X36 MD (GAUZE/BANDAGES/DRESSINGS) IMPLANT
SUCTION FRAZIER TIP 10 FR DISP (SUCTIONS) IMPLANT
SUT VIC AB 2-0 CTB1 (SUTURE) IMPLANT
TOWEL OR 17X24 6PK STRL BLUE (TOWEL DISPOSABLE) IMPLANT
TOWEL OR 17X26 10 PK STRL BLUE (TOWEL DISPOSABLE) IMPLANT
TUBE CONNECTING 12'X1/4 (SUCTIONS)
TUBE CONNECTING 12X1/4 (SUCTIONS) IMPLANT
WATER STERILE IRR 1000ML POUR (IV SOLUTION) IMPLANT
YANKAUER SUCT BULB TIP NO VENT (SUCTIONS) IMPLANT

## 2015-07-08 NOTE — Clinical Social Work Placement (Signed)
   CLINICAL SOCIAL WORK PLACEMENT  NOTE  Date:  07/08/2015  Patient Details  Name: Dominique Mckee MRN: [id number] Date of Birth: 1930-04-16  Clinical Social Work is seeking post-discharge placement for this patient at the [place] level of care (*CSW will initial, date and re-position this form in  chart as items are completed):  Yes   Patient/family provided with [facility] [place] Work Department's list of facilities offering this level of care within the geographic area requested by the patient (or if unable, by the patient's family).  Yes   Patient/family informed of their freedom to choose among providers that offer the needed level of care, that participate in Medicare, Medicaid or managed care program needed by the patient, have an available bed and are willing to accept the patient.  Yes   Patient/family informed of [facility]'s ownership interest in [facility] and [facility], as well as of the fact that they are under no obligation to receive care at these facilities.  PASRR submitted to EDS on 07/08/15     PASRR number received on       Existing PASRR number confirmed on 07/08/15     FL2 transmitted to all facilities in geographic area requested by pt/family on 07/08/15     FL2 transmitted to all facilities within larger geographic area on       Patient informed that his/her managed care company has contracts with or will negotiate with certain facilities, including the following:        Yes   Patient/family informed of bed offers received.  Patient chooses bed at       Physician recommends and patient chooses bed at      Patient to be transferred to   on  .  Patient to be transferred to facility by [facility]     Patient family notified on   of transfer.  Name of family member notified:  granddaughter [name]      PHYSICIAN Please sign FL2, Please prepare prescriptions     Additional Comment:     _______________________________________________ Dulcy Fanny, LCSW 07/08/2015, 12:22 PM

## 2015-07-08 NOTE — Progress Notes (Signed)
Plan surgery today Wide range of possible outcomes possible Discussed with caroline  Granddaughter  Thursday am

## 2015-07-08 NOTE — Progress Notes (Signed)
  Echocardiogram 2D Echocardiogram has been performed.  Dominique Mckee 07/08/2015, 5:43 PM

## 2015-07-08 NOTE — Progress Notes (Signed)
Physical Therapy Treatment Patient Details Name: Dominique Mckee MRN: [id number] DOB: 1930/03/27 Today's Date: 07/08/2015    History of Present Illness 81 y.o. female with a past medical history of hypertension, rheumatoid arthritis, benign essential tremor, GERD who presented to the emergency department with worsening pain in her right shoulder. Patient had been seen in the emergency department about a week prior to admission by Dr. Tyrone Nine who was unable to reduce the shoulder. The patient reports that she was later attempting to pull up her pants when her shoulder popped out again. She then went to the emergency department for further treatment.     PT Comments    Patient scheduled to surgery soon, declined getting out of bed. Focus of session was on general LE strengthening bilaterally. Will continue to follow for mobility progression.   Follow Up Recommendations  SNF;Supervision/Assistance - 24 hour     Equipment Recommendations  Other (comment);None recommended by PT (to be assessed at next level of care)    Recommendations for Other Services       Precautions / Restrictions Precautions Precautions: Shoulder;Other (comment) Required Braces or Orthoses: Sling Restrictions Weight Bearing Restrictions: Yes RUE Weight Bearing: Non weight bearing    Mobility  Bed Mobility                  Transfers                    Ambulation/Gait                 Stairs            Wheelchair Mobility    Modified Rankin (Stroke Patients Only)       Balance                                    Cognition Arousal/Alertness: Awake/alert Behavior During Therapy: WFL for tasks assessed/performed Overall Cognitive Status: Within Functional Limits for tasks assessed                      Exercises General Exercises - Lower Extremity Ankle Circles/Pumps: AROM;Both;15 reps;Supine Quad Sets: Strengthening;Both;10 reps;Supine Gluteal  Sets: Strengthening;Both;10 reps;Supine Short Arc Quad: Strengthening;Both;10 reps;Supine Heel Slides: Strengthening;Both;10 reps;Supine Hip ABduction/ADduction: Strengthening;Both;10 reps;Supine Other Exercises Other Exercises: isometric adduction bilaterl 1X10 Other Exercises: Heel digs bilateral 1X10    General Comments General comments (skin integrity, edema, etc.): Patient headed to surgery soon, declined getting out of bed at this time.      Pertinent Vitals/Pain Pain Assessment: 0-10 Pain Score: 4  Pain Location: Rt shoulder/arm Pain Descriptors / Indicators: Sore;Aching Pain Intervention(s): Monitored during session    Home Living                      Prior Function            PT Goals (current goals can now be found in the care plan section) Acute Rehab PT Goals Patient Stated Goal: keep legs strong PT Goal Formulation: With patient Time For Goal Achievement: 07/21/15 Potential to Achieve Goals: Good Progress towards PT goals: Not progressing toward goals - comment (bed exercises, patient scheduled for surgery soon. )    Frequency  Min 3X/week    PT Plan Current plan remains appropriate    Co-evaluation             End of Session  Activity Tolerance: No increased pain;Patient tolerated treatment well Patient left: in bed;with call bell/phone within reach;with bed alarm set     Time: 1030-1051 PT Time Calculation (min) (ACUTE ONLY): 21 min  Charges:  $Therapeutic Exercise: 8-22 mins                    G Codes:      Cassell Clement, PT, CSCS Pager [phone] Office [phone]  07/08/2015, 11:02 AM

## 2015-07-08 NOTE — Progress Notes (Signed)
[facility] PROGRESS NOTE  Dominique Mckee LOV:[id number] DOB: May 11, 1930 DOA: 07/05/2015 PCP: Dorian Heckle, MD  Brief Summary  Dominique Mckee is a 81 y.o. female with a past medical history of hypertension, rheumatoid arthritis, benign essential tremor, GERD who presented to the emergency department with worsening pain in her right shoulder.  Patient had been seen in the emergency department about a week prior to admission by Dr. Tyrone Nine who was unable to reduce the shoulder. When trying to put her arm in to her chair she stated that she felt "a pop" in her right shoulder with significant worsening of the pain which prompted her to return to the emergency department.   Assessment/Plan  Shoulder dislocation, recurrent -  Appreciate orthopedic surgery assistance -  Continue oral pain medication with IV for breakthrough only -  To OR today for reduction and repair  Essential tremor -  Reduced propranolol to 20 mg twice a day secondary to low blood pressure  Acute hypoxic respiratory failure secondary to probable acute diastolic heart failure given vascular congestion seen on initial CXR. Respiratory distress improved with diuresis. -  Strict I/O not strictly recorded -  Daily weights:  Weight reportedly up to kilograms despite avoiding frequent yesterday -  Hold Lasix 20mg  prior to OR -  Creatinine trending up slightly -  Wean oxygen as tolerated  HTN (hypertension), blood pressure stable -  Discontinued norvasc due to low blood pressure -  Propranolol dose reduced  Rheumatoid arthritis, stable -  Continue plaquenil  -  Methotrexate on hold until next week secondary to surgery today  GERD (gastroesophageal reflux disease), stable, continue PPI  Diet:  NPO for procedure  Access:  PIV IVF:  off Proph:  lovenox  Code Status: full Family Communication: patient and her granddaughter Disposition Plan: pending correction of her shoudler   Consultants:  Orthopedics, Dr.  Marlou Sa  Procedures:  MRI right shoulder on 9/7:   anterior dislocation of the right humeral head. The humeral head is impacted onto the anterior rim of the glenoid with a deep Hill-Sachs lesion. There is a full-thickness tear of the inferior fibers of the teres minor muscle and there are strains of all of the muscles of the rotator cuff.  Right shoulder repair on 9/9  Antibiotics:  none   HPI/Subjective:  Feeling well, breathing is much better. She denies shortness of breath, wheezing, chest tightness or pain, nausea.   Shoulder was hurting some overnight.  Objective: Filed Vitals:   07/07/15 1547 07/07/15 2026 07/08/15 0604 07/08/15 1100  BP: 98/54 127/55 105/33 126/44  Pulse: 55 59 56 71  Temp:  98.7 F (37.1 C) 98.1 F (36.7 C) 98.1 F (36.7 C)  TempSrc:  Oral Tympanic Tympanic  Resp:  18 18 18   Weight:   88.7 kg (195 lb 8.8 oz)   SpO2:  97% 98% 95%    Intake/Output Summary (Last 24 hours) at 07/08/15 1356 Last data filed at 07/08/15 1100  Gross per 24 hour  Intake 594.17 ml  Output    450 ml  Net 144.17 ml   Filed Weights   07/07/15 1500 07/08/15 0604  Weight: 86.6 kg (190 lb 14.7 oz) 88.7 kg (195 lb 8.8 oz)   Body mass index is 35.76 kg/(m^2).  Exam:   General:  Adult female, No acute distress  HEENT:  NCAT, MMM  Cardiovascular:  RRR, nl S1, S2, 2/6 systolic murmur, 2+ pulses, warm extremities  Respiratory:  Wheezes and dependent rales, no rhonchi, no  increased WOB  Abdomen:   NABS, soft, NT/ND  MSK:   Normal tone and bulk, no LEE, right shoulder is elevated and anteriorly displaced. Did not attempt range of motion. Sensation intact light touch and pulse 2+  Neuro:  Able to wiggle fingers and has grip with the right hand  Data Reviewed: Basic Metabolic Panel:  Recent Labs Lab 07/05/15 2109 07/05/15 2221 07/06/15 0332 07/07/15 0515 07/08/15 0459  NA 137 138  --  138 137  K 7.2* 4.6  --  4.0 3.8  CL 106 106  --  102 99*  CO2  --  23  --  28  28  GLUCOSE 132* 125*  --  101* 94  BUN 67* 42*  --  26* 37*  CREATININE 1.40* 1.27* 1.32* 1.10* 1.55*  CALCIUM  --  9.1  --  8.7* 8.2*   Liver Function Tests: No results for input(s): AST, ALT, ALKPHOS, BILITOT, PROT, ALBUMIN in the last 168 hours. No results for input(s): LIPASE, AMYLASE in the last 168 hours. No results for input(s): AMMONIA in the last 168 hours. CBC:  Recent Labs Lab 07/05/15 2100 07/05/15 2109 07/06/15 0332 07/07/15 0515 07/08/15 0459  WBC 12.0*  --  11.9* 10.7* 10.7*  NEUTROABS 9.8*  --   --   --   --   HGB 11.1* 11.9* 11.0* 10.3* 10.2*  HCT 34.3* 35.0* 35.2* 33.1* 31.7*  MCV 99.1  --  98.3 99.4 99.1  PLT 270  --  243 239 201    Recent Results (from the past 240 hour(s))  Surgical pcr screen     Status: None   Collection Time: 07/07/15  6:56 AM  Result Value Ref Range Status   MRSA, PCR NEGATIVE NEGATIVE Final   Staphylococcus aureus NEGATIVE NEGATIVE Final    Comment:        The Xpert SA Assay (FDA approved for NASAL specimens in patients over 63 years of age), is one component of a comprehensive surveillance program.  Test performance has been validated by [facility] for patients greater than or equal to 36 year old. It is not intended to diagnose infection nor to guide or monitor treatment.      Studies: No results found.  Scheduled Meds: . [MAR Hold] amLODipine  5 mg Oral q morning - 10a  . [MAR Hold] antiseptic oral rinse  7 mL Mouth Rinse BID  . [MAR Hold] docusate sodium  100 mg Oral BID  . [MAR Hold] DULoxetine  60 mg Oral Daily  . [MAR Hold] fentaNYL  25 mcg Transdermal Q72H  . fentaNYL (SUBLIMAZE) injection  100 mcg Intravenous Once  . [MAR Hold] ferrous sulfate  325 mg Oral QPM  . [MAR Hold] folic acid  3 mg Oral Daily  . [MAR Hold] hydroxychloroquine  400 mg Oral Daily  . [MAR Hold] Influenza vac split quadrivalent PF  0.5 mL Intramuscular Tomorrow-1000  . [MAR Hold] lidocaine  5 mL Intradermal Once  . [facility] Hold]  methotrexate  17.5 mg Intramuscular Q Fri  . [MAR Hold] pantoprazole  40 mg Oral Daily  . [MAR Hold] propranolol  20 mg Oral BID  . [MAR Hold] senna  2 tablet Oral QHS   Continuous Infusions: . sodium chloride 50 mL/hr at 07/08/15 6767    Principal Problem:   Shoulder dislocation, recurrent Active Problems:   Essential and other specified forms of tremor   HTN (hypertension)   Rheumatoid arthritis   GERD (gastroesophageal reflux disease)  Shoulder dislocation    Time spent: 30 min    Barney Gertsch, [place] [facility] Pager [phone]. If 7PM-7AM, please contact night-coverage at [email or web address], password [facility] 07/08/2015, 1:56 PM

## 2015-07-08 NOTE — Transfer of Care (Signed)
Immediate Anesthesia Transfer of Care Note  Patient: Dominique Mckee  Procedure(s) Performed: Procedure(s): RIGHT SHOULDER CLOSED REDUCTION (Right)  Patient Location: PACU  Anesthesia Type:General and Regional  Level of Consciousness: awake, alert  and oriented  Airway & Oxygen Therapy: Patient Spontanous Breathing and Patient connected to nasal cannula oxygen  Post-op Assessment: Report given to RN, Post -op Vital signs reviewed and stable and Patient moving all extremities X 4  Post vital signs: Reviewed and stable  Last Vitals:  Filed Vitals:   07/08/15 1100  BP: 126/44  Pulse: 71  Temp: 36.7 C  Resp: 18    Complications: No apparent anesthesia complications

## 2015-07-08 NOTE — Anesthesia Postprocedure Evaluation (Signed)
Anesthesia Post Note  Patient: Dominique Mckee  Procedure(s) Performed: Procedure(s) (LRB): RIGHT SHOULDER CLOSED REDUCTION (Right)  Anesthesia type: General  Patient location: PACU  Post pain: Pain level controlled  Post assessment: Post-op Vital signs reviewed  Last Vitals: BP 118/48 mmHg  Pulse 64  Temp(Src) 36.9 C (Tympanic)  Resp 15  Wt 195 lb 8.8 oz (88.7 kg)  SpO2 96%  Post vital signs: Reviewed  Level of consciousness: sedated  Complications: No apparent anesthesia complications

## 2015-07-08 NOTE — Clinical Social Work Note (Signed)
Clinical Social Work Assessment  Patient Details  Name: Dominique Mckee MRN: [id number] Date of Birth: [birth year]  Date of referral:  07/08/15               Reason for consult:  Facility Placement                Permission sought to share information with:  Family Supports, Chartered certified accountant granted to share information::  Yes, Verbal Permission Granted  Name::      (granddaughter Camera operator)  Agency::   ([facility] and [employer])  Relationship::     Contact Information:     Housing/Transportation Living arrangements for the past 2 months:  [place] of Information:  Patient, Other (Comment Required) (granddaughter [name]) Patient Interpreter Needed:  None Criminal Activity/Legal Involvement Pertinent to Current Situation/Hospitalization:  No - Comment as needed Significant Relationships:  Adult Children, Other(Comment) (granddaughter [name]) Lives with:  Adult Children (son) Do you feel safe going back to the place where you live?  No (fall risk) Need for family participation in patient care:     Care giving concerns:  Patient level of care needed at time of discharge is more than can be met at home.   Social Worker assessment / plan:  CSW met with granddaughter, [name] to review discharge plans.  Patient is to have surgery today on a dislocated shoulder.  MD reports surgery being complex.  Patient is from home with son who is unable to provide needed supervision and assistance needed at time of discharge.  Patient and family feel SNF is best at time of discharge for necessary therapies.  Family's first choice is [facility] and second is [facility].  Granddaughter is main contact for placement decisions per patient.  Family requests [facility] transportation at time of discharge.  Employment status:  Retired Nurse, adult PT Recommendations:  [place] / Referral to community  resources:  [place]  Patient/Family's Response to care:  Agreeable to SNF  Patient/Family's Understanding of and Emotional Response to Diagnosis, Current Treatment, and Prognosis:  Family is realistic regarding level of care needed and prognosis  Emotional Assessment Appearance:  Appears younger than stated age Attitude/Demeanor/Rapport:   (appropriate) Affect (typically observed):  Accepting, Adaptable Orientation:  Oriented to Self, Fluctuating Orientation (Suspected and/or reported Sundowners) Alcohol / Substance use:  Not Applicable Psych involvement (Current and /or in the community):  Outpatient Provider  Discharge Needs  Concerns to be addressed:  No discharge needs identified Readmission within the last 30 days:  No Current discharge risk:  None Barriers to Discharge:  No Barriers Identified   Dulcy Fanny, LCSW 07/08/2015, 12:18 PM

## 2015-07-08 NOTE — Anesthesia Preprocedure Evaluation (Signed)
Anesthesia Evaluation  Patient identified by MRN, date of birth, ID band Patient awake    Reviewed: Allergy & Precautions, NPO status , Patient's Chart, lab work & pertinent test results, reviewed documented beta blocker date and time   Airway Mallampati: II  TM Distance: >3 FB Neck ROM: Full    Dental no notable dental hx.    Pulmonary neg pulmonary ROS,    Pulmonary exam normal breath sounds clear to auscultation       Cardiovascular hypertension, Pt. on medications and Pt. on home beta blockers Normal cardiovascular exam Rhythm:Regular Rate:Normal     Neuro/Psych  Headaches, PSYCHIATRIC DISORDERS Depression    GI/Hepatic Neg liver ROS, GERD  ,  Endo/Other  negative endocrine ROS  Renal/GU Renal disease     Musculoskeletal  (+) Arthritis , Fibromyalgia -  Abdominal   Peds  Hematology negative hematology ROS (+)   Anesthesia Other Findings   Reproductive/Obstetrics negative OB ROS                             Anesthesia Physical Anesthesia Plan  ASA: III  Anesthesia Plan: General and Regional   Post-op Pain Management:    Induction: Intravenous  Airway Management Planned: Oral ETT  Additional Equipment: None  Intra-op Plan:   Post-operative Plan: Extubation in OR  Informed Consent: I have reviewed the patients History and Physical, chart, labs and discussed the procedure including the risks, benefits and alternatives for the proposed anesthesia with the patient or authorized representative who has indicated his/her understanding and acceptance.   Dental advisory given  Plan Discussed with: CRNA  Anesthesia Plan Comments:         Anesthesia Quick Evaluation

## 2015-07-08 NOTE — Brief Op Note (Signed)
07/05/2015 - 07/08/2015  2:55 PM  PATIENT:  Dominique Mckee  81 y.o. female  PRE-OPERATIVE DIAGNOSIS:  RIGHT SHOULDER DISLOCATION  POST-OPERATIVE DIAGNOSIS:  RIGHT SHOULDER DISLOCATION  PROCEDURE:  Procedure(s): RIGHT SHOULDER CLOSED REDUCTION  SURGEON:  Surgeon(s): Meredith Pel, MD  ASSISTANT: none  ANESTHESIA:   general  EBL: 0 ml    Total I/O In: 854.2 [P.O.:200; I.V.:654.2] Out: -   BLOOD ADMINISTERED: none  DRAINS: none   LOCAL MEDICATIONS USED:  none  SPECIMEN:  No Specimen  COUNTS:  YES  TOURNIQUET:  * No tourniquets in log *  DICTATION: .Other Dictation: Dictation Number [id number]  PLAN OF CARE: Admit to inpatient   PATIENT DISPOSITION:  PACU - hemodynamically stable

## 2015-07-08 NOTE — Anesthesia Procedure Notes (Addendum)
Procedure Name: Intubation Date/Time: 07/08/2015 2:30 PM Performed by: Garrison Columbus T Pre-anesthesia Checklist: Patient identified, Emergency Drugs available, Suction available and Patient being monitored Patient Re-evaluated:Patient Re-evaluated prior to inductionOxygen Delivery Method: Circle system utilized Preoxygenation: Pre-oxygenation with 100% oxygen Intubation Type: IV induction Ventilation: Mask ventilation without difficulty Laryngoscope Size: Glidescope and 3 Grade View: Grade I Tube type: Oral Tube size: 7.0 mm Number of attempts: 1 Airway Equipment and Method: Stylet and Video-laryngoscopy Placement Confirmation: ETT inserted through vocal cords under direct vision,  positive ETCO2 and breath sounds checked- equal and bilateral Secured at: 20 cm Tube secured with: Tape Dental Injury: Teeth and Oropharynx as per pre-operative assessment    Anesthesia Regional Block:  Interscalene brachial plexus block  Pre-Anesthetic Checklist: ,, timeout performed, Correct Patient, Correct Site, Correct Laterality, Correct Procedure, Correct Position, site marked, Risks and benefits discussed, Surgical consent,  Pre-op evaluation,  Post-op pain management  Laterality: Right  Prep: chloraprep       Needles:  Injection technique: Single-shot  Needle Type: Stimulator Needle - 40     Needle Length: 4cm 4 cm Needle Gauge: 22 and 22 G    Additional Needles:  Procedures: ultrasound guided (picture in chart) Interscalene brachial plexus block Narrative:  Injection made incrementally with aspirations every 5 mL. Anesthesiologist: Nolon Nations  Additional Notes: BP cuff, EKG monitors applied. Sedation begun. Nerve location verified with U/S. Anesthetic injected incrementally, slowly , and after neg aspirations under direct u/s guidance. Good perineural spread. Tolerated well.

## 2015-07-09 DIAGNOSIS — J9601 Acute respiratory failure with hypoxia: Secondary | ICD-10-CM

## 2015-07-09 DIAGNOSIS — I5031 Acute diastolic (congestive) heart failure: Secondary | ICD-10-CM

## 2015-07-09 LAB — BASIC METABOLIC PANEL
ANION GAP: 9 (ref 5–15)
BUN: 27 mg/dL — ABNORMAL HIGH (ref 6–20)
CALCIUM: 8 mg/dL — AB (ref 8.9–10.3)
CO2: 24 mmol/L (ref 22–32)
Chloride: 104 mmol/L (ref 101–111)
Creatinine, Ser: 1.16 mg/dL — ABNORMAL HIGH (ref 0.44–1.00)
GFR calc Af Amer: 49 mL/min — ABNORMAL LOW (ref >60)
GFR, EST NON AFRICAN AMERICAN: 42 mL/min — AB (ref >60)
GLUCOSE: 99 mg/dL (ref 65–99)
Potassium: 4 mmol/L (ref 3.5–5.1)
Sodium: 137 mmol/L (ref 135–145)

## 2015-07-09 LAB — CBC
HCT: 30.6 % — ABNORMAL LOW (ref 36.0–46.0)
HEMOGLOBIN: 9.9 g/dL — AB (ref 12.0–15.0)
MCH: 32.2 pg (ref 26.0–34.0)
MCHC: 32.4 g/dL (ref 30.0–36.0)
MCV: 99.7 fL (ref 78.0–100.0)
PLATELETS: 183 10*3/uL (ref 150–400)
RBC: 3.07 MIL/uL — ABNORMAL LOW (ref 3.87–5.11)
RDW: 15.1 % (ref 11.5–15.5)
WBC: 11.6 10*3/uL — ABNORMAL HIGH (ref 4.0–10.5)

## 2015-07-09 MED ORDER — HYDROCHLOROTHIAZIDE 12.5 MG PO CAPS
12.5000 mg | ORAL_CAPSULE | Freq: Every day | ORAL | Status: DC
  Administered 2015-07-10: 12.5 mg via ORAL
  Filled 2015-07-09: qty 1

## 2015-07-09 NOTE — Progress Notes (Signed)
Subjective: 1 Day Post-Op Procedure(s) (LRB): RIGHT SHOULDER CLOSED REDUCTION (Right) Patient reports pain as mild.   She is using hand to brush her teeth and states her shoulder feels good. Sling still on.  Objective: Vital signs in last 24 hours: Temp:  [98.1 F (36.7 C)-98.8 F (37.1 C)] 98.3 F (36.8 C) (09/10 0423) Pulse Rate:  [43-74] 64 (09/10 0423) Resp:  [14-20] 16 (09/10 0423) BP: (108-132)/(34-57) 128/50 mmHg (09/10 0423) SpO2:  [95 %-98 %] 96 % (09/10 0423) Weight:  [88.451 kg (195 lb)] 88.451 kg (195 lb) (09/10 0500)  Intake/Output from previous day: 09/09 0701 - 09/10 0700 In: 1104.2 [P.O.:400; I.V.:704.2] Out: 300 [Urine:300] Intake/Output this shift: Total I/O In: 120 [P.O.:120] Out: 350 [Urine:350]   Recent Labs  07/07/15 0515 07/08/15 0459 07/09/15 0430  HGB 10.3* 10.2* 9.9*    Recent Labs  07/08/15 0459 07/09/15 0430  WBC 10.7* 11.6*  RBC 3.20* 3.07*  HCT 31.7* 30.6*  PLT 201 183    Recent Labs  07/08/15 0459 07/09/15 0430  NA 137 137  K 3.8 4.0  CL 99* 104  CO2 28 24  BUN 37* 27*  CREATININE 1.55* 1.16*  GLUCOSE 94 99  CALCIUM 8.2* 8.0*   No results for input(s): LABPT, INR in the last 72 hours.  Neurologically intact  Assessment/Plan: 1 Day Post-Op Procedure(s) (LRB): RIGHT SHOULDER CLOSED REDUCTION (Right) Up with therapy .   Dominique Mckee C 07/09/2015, 8:58 AM

## 2015-07-09 NOTE — Progress Notes (Signed)
[facility] PROGRESS NOTE  SCARLETTROSE COSTILOW ZRA:[id number] DOB: [birth year] DOA: 07/05/2015 PCP: Dorian Heckle, MD  Brief Summary  Dominique Mckee is a 81 y.o. female with a past medical history of hypertension, rheumatoid arthritis, benign essential tremor, GERD who presented to the emergency department with worsening pain in her right shoulder.  Patient had been seen in the emergency department about a week prior to admission by Dr. Tyrone Nine who was unable to reduce the shoulder. When trying to put her arm in to her chair she stated that she felt "a pop" in her right shoulder with significant worsening of the pain which prompted her to return to the emergency department.   Assessment/Plan  Shoulder dislocation, recurrent -  Appreciate orthopedic surgery assistance -  Continue oral pain medication with IV for breakthrough only -  Closed reduction performed on 9/9  Essential tremor -  Continue reduced propranolol to 20 mg twice a day secondary to low blood pressure  Acute hypoxic respiratory failure secondary to probable acute diastolic heart failure given vascular congestion seen on initial CXR. Respiratory distress improved with diuresis. -  Strict I/O not strictly recorded -  Daily weights:  Weight reportedly up to kilograms despite avoiding frequent yesterday -  Held Lasix 20mg  prior to OR, but creatinine now back to baseline -  Resume diuretic in AM -  IS and transition to room air today  HTN (hypertension), blood pressure low normal to mildly hypotensive -  Discontinued norvasc due to low blood pressure -  Propranolol dose reduced  Rheumatoid arthritis, stable -  Continue plaquenil  -  Methotrexate deferred for one week due to surgery  GERD (gastroesophageal reflux disease), stable, continue PPI  Diet:  Healthy heart Access:  PIV IVF:  off Proph:  lovenox  Code Status: full Family Communication: patient alone Disposition Plan:  To skilled nursing facility pending  reevaluation by physical and occupational therapy, anticipate discharge on 9/11. Weaning oxygen   Consultants:  Orthopedics, Dr. Marlou Sa  Procedures:  MRI right shoulder on 9/7:   anterior dislocation of the right humeral head. The humeral head is impacted onto the anterior rim of the glenoid with a deep Hill-Sachs lesion. There is a full-thickness tear of the inferior fibers of the teres minor muscle and there are strains of all of the muscles of the rotator cuff.  Right shoulder repair on 9/9  Antibiotics:  none   HPI/Subjective:  Feeling well, breathing is much better. She denies shortness of breath, wheezing, chest tightness or pain, nausea.   Shoulder was hurting some overnight.  Objective: Filed Vitals:   07/09/15 0028 07/09/15 0423 07/09/15 0500 07/09/15 1309  BP: 108/38 128/50  113/42  Pulse: 74 64  65  Temp: 98.8 F (37.1 C) 98.3 F (36.8 C)  99.1 F (37.3 C)  TempSrc: Oral Oral  Oral  Resp: 16 16  18   Weight:   88.451 kg (195 lb)   SpO2: 97% 96%  92%    Intake/Output Summary (Last 24 hours) at 07/09/15 1604 Last data filed at 07/09/15 1309  Gross per 24 hour  Intake    440 ml  Output    950 ml  Net   -510 ml   Filed Weights   07/07/15 1500 07/08/15 0604 07/09/15 0500  Weight: 86.6 kg (190 lb 14.7 oz) 88.7 kg (195 lb 8.8 oz) 88.451 kg (195 lb)   Body mass index is 35.66 kg/(m^2).  Exam:   General:  Adult female, No acute distress  HEENT:  NCAT, MMM  Cardiovascular:  RRR, nl S1, S2, 2/6 systolic murmur, 2+ pulses, warm extremities  Respiratory:  Wheezes and dependent rales, no rhonchi, no increased WOB  Abdomen:   NABS, soft, NT/ND  MSK:   Normal tone and bulk, no LEE, right shoulder is elevated and anteriorly displaced. Did not attempt range of motion. Sensation intact light touch and pulse 2+  Neuro:  Able to wiggle fingers and has grip with the right hand  Data Reviewed: Basic Metabolic Panel:  Recent Labs Lab 07/05/15 2109 07/05/15 2221  07/06/15 0332 07/07/15 0515 07/08/15 0459 07/09/15 0430  NA 137 138  --  138 137 137  K 7.2* 4.6  --  4.0 3.8 4.0  CL 106 106  --  102 99* 104  CO2  --  23  --  28 28 24   GLUCOSE 132* 125*  --  101* 94 99  BUN 67* 42*  --  26* 37* 27*  CREATININE 1.40* 1.27* 1.32* 1.10* 1.55* 1.16*  CALCIUM  --  9.1  --  8.7* 8.2* 8.0*   Liver Function Tests: No results for input(s): AST, ALT, ALKPHOS, BILITOT, PROT, ALBUMIN in the last 168 hours. No results for input(s): LIPASE, AMYLASE in the last 168 hours. No results for input(s): AMMONIA in the last 168 hours. CBC:  Recent Labs Lab 07/05/15 2100 07/05/15 2109 07/06/15 0332 07/07/15 0515 07/08/15 0459 07/09/15 0430  WBC 12.0*  --  11.9* 10.7* 10.7* 11.6*  NEUTROABS 9.8*  --   --   --   --   --   HGB 11.1* 11.9* 11.0* 10.3* 10.2* 9.9*  HCT 34.3* 35.0* 35.2* 33.1* 31.7* 30.6*  MCV 99.1  --  98.3 99.4 99.1 99.7  PLT 270  --  243 239 201 183    Recent Results (from the past 240 hour(s))  Surgical pcr screen     Status: None   Collection Time: 07/07/15  6:56 AM  Result Value Ref Range Status   MRSA, PCR NEGATIVE NEGATIVE Final   Staphylococcus aureus NEGATIVE NEGATIVE Final    Comment:        The Xpert SA Assay (FDA approved for NASAL specimens in patients over 60 years of age), is one component of a comprehensive surveillance program.  Test performance has been validated by [facility] for patients greater than or equal to 68 year old. It is not intended to diagnose infection nor to guide or monitor treatment.      Studies: Dg Shoulder Right  07/08/2015   CLINICAL DATA:  Surgery. Closed reduction right shoulder. Fluoroscopy time 10 seconds.  EXAM: DG C-ARM 61-120 MIN; RIGHT SHOULDER - 2+ VIEW  COMPARISON:  MRI 07/06/2015  FINDINGS: Three images are performed, showing right humerus within the glenoid fossa. No acute fracture or dislocation. Hill-Sachs deformity again noted.  IMPRESSION: Interval reduction.   Electronically  Signed   By: Nolon Nations M.D.   On: 07/08/2015 15:42   Dg C-arm 1-60 Min  07/08/2015   CLINICAL DATA:  Surgery. Closed reduction right shoulder. Fluoroscopy time 10 seconds.  EXAM: DG C-ARM 61-120 MIN; RIGHT SHOULDER - 2+ VIEW  COMPARISON:  MRI 07/06/2015  FINDINGS: Three images are performed, showing right humerus within the glenoid fossa. No acute fracture or dislocation. Hill-Sachs deformity again noted.  IMPRESSION: Interval reduction.   Electronically Signed   By: Nolon Nations M.D.   On: 07/08/2015 15:42    Scheduled Meds: . amLODipine  5 mg Oral q morning -  10a  . antiseptic oral rinse  7 mL Mouth Rinse BID  . docusate sodium  100 mg Oral BID  . DULoxetine  60 mg Oral Daily  . enoxaparin (LOVENOX) injection  30 mg Subcutaneous Q24H  . fentaNYL  25 mcg Transdermal Q72H  . ferrous sulfate  325 mg Oral QPM  . folic acid  3 mg Oral Daily  . hydroxychloroquine  400 mg Oral Daily  . Influenza vac split quadrivalent PF  0.5 mL Intramuscular Tomorrow-1000  . lidocaine  5 mL Intradermal Once  . [START ON 07/15/2015] methotrexate  17.5 mg Intramuscular Q Fri  . pantoprazole  40 mg Oral Daily  . propranolol  20 mg Oral BID  . senna  2 tablet Oral QHS   Continuous Infusions:    Principal Problem:   Shoulder dislocation, recurrent Active Problems:   Essential and other specified forms of tremor   HTN (hypertension)   Rheumatoid arthritis   GERD (gastroesophageal reflux disease)   Shoulder dislocation   Acute diastolic congestive heart failure   Acute respiratory failure with hypoxia    Time spent: 30 min    Lashawnta Burgert, [place] [facility] Pager [phone]. If 7PM-7AM, please contact night-coverage at [email or web address], password [facility] 07/09/2015, 4:04 PM  LOS: 1 day

## 2015-07-09 NOTE — Op Note (Signed)
NAME:  Dominique Mckee, Dominique Mckee NO.:  [id number]  MEDICAL RECORD NO.:  [id number]  LOCATION:  5N03C                        FACILITY:  [facility]  PHYSICIAN:  Anderson Malta, M.D.    DATE OF BIRTH:  10/01/1930  DATE OF PROCEDURE: DATE OF DISCHARGE:                              OPERATIVE REPORT   PREOPERATIVE DIAGNOSIS:  Right shoulder dislocation.  POSTOPERATIVE DIAGNOSIS:  Right shoulder dislocation.  PROCEDURE:  Right shoulder closed reduction of 29-week-old anterior shoulder dislocation.  SURGEONS:  Anderson Malta, M.D.  ASSIST:  None.  ANESTHESIA:  General.  INDICATIONS:  [name] is an 81 year old female with right shoulder pain, had a dislocation 2 weeks ago, could not be reduced in the emergency room, presents now for operative management after explanation of risks and benefits.  PROCEDURE IN DETAIL:  The patient was brought to operating room, where general anesthetic was induced.  Using a combination of traction and internal-external rotation, the shoulder was reduced.  Under fluoroscopic guidance, the reduction was confirmed.  The patient was stable at 80 degrees of abduction and about 60 degrees of external rotation.  At this time, the patient was placed into a shoulder sling. She will stay in the sling for 3 weeks.  Follow up with me at that time.     Anderson Malta, M.D.     GSD/MEDQ  D:  07/08/2015  T:  07/09/2015  Job:  [id number]

## 2015-07-09 NOTE — Progress Notes (Signed)
Bed offers presented to pt and she will discuss with family.

## 2015-07-10 DIAGNOSIS — M24411 Recurrent dislocation, right shoulder: Secondary | ICD-10-CM | POA: Diagnosis not present

## 2015-07-10 DIAGNOSIS — M069 Rheumatoid arthritis, unspecified: Secondary | ICD-10-CM

## 2015-07-10 LAB — BASIC METABOLIC PANEL
ANION GAP: 9 (ref 5–15)
BUN: 28 mg/dL — ABNORMAL HIGH (ref 6–20)
CALCIUM: 8.7 mg/dL — AB (ref 8.9–10.3)
CO2: 27 mmol/L (ref 22–32)
Chloride: 106 mmol/L (ref 101–111)
Creatinine, Ser: 1.1 mg/dL — ABNORMAL HIGH (ref 0.44–1.00)
GFR, EST AFRICAN AMERICAN: 52 mL/min — AB (ref >60)
GFR, EST NON AFRICAN AMERICAN: 45 mL/min — AB (ref >60)
GLUCOSE: 92 mg/dL (ref 65–99)
POTASSIUM: 4 mmol/L (ref 3.5–5.1)
SODIUM: 142 mmol/L (ref 135–145)

## 2015-07-10 LAB — CBC
HCT: 33.3 % — ABNORMAL LOW (ref 36.0–46.0)
HEMOGLOBIN: 10.8 g/dL — AB (ref 12.0–15.0)
MCH: 32.1 pg (ref 26.0–34.0)
MCHC: 32.4 g/dL (ref 30.0–36.0)
MCV: 99.1 fL (ref 78.0–100.0)
Platelets: 235 10*3/uL (ref 150–400)
RBC: 3.36 MIL/uL — ABNORMAL LOW (ref 3.87–5.11)
RDW: 15 % (ref 11.5–15.5)
WBC: 9.5 10*3/uL (ref 4.0–10.5)

## 2015-07-10 MED ORDER — SENNA 8.6 MG PO TABS: 2.0000 | ORAL_TABLET | Freq: Every day | ORAL | Status: DC

## 2015-07-10 MED ORDER — PROPRANOLOL HCL 20 MG PO TABS
20.0000 mg | ORAL_TABLET | Freq: Two times a day (BID) | ORAL | Status: DC
Start: 2015-07-10 — End: 2018-11-14

## 2015-07-10 MED ORDER — FENTANYL 25 MCG/HR TD PT72: 25.0000 ug | MEDICATED_PATCH | TRANSDERMAL | Status: DC

## 2015-07-10 MED ORDER — OXYCODONE HCL 10 MG PO TABS: 10.0000 mg | ORAL_TABLET | Freq: Three times a day (TID) | ORAL | Status: DC

## 2015-07-10 MED ORDER — PROPRANOLOL HCL 20 MG PO TABS
20.0000 mg | ORAL_TABLET | Freq: Two times a day (BID) | ORAL | Status: DC
  Filled 2015-07-10: qty 1

## 2015-07-10 MED ORDER — PROPRANOLOL HCL 10 MG PO TABS: 10.0000 mg | ORAL_TABLET | Freq: Two times a day (BID) | ORAL | Status: DC

## 2015-07-10 MED ORDER — POLYETHYLENE GLYCOL 3350 17 GM/SCOOP PO POWD: 17.0000 g | Freq: Every day | ORAL | Status: DC

## 2015-07-10 NOTE — Progress Notes (Signed)
CSW (Clinical Education officer, museum) prepared pt dc packet and placed with shadow chart. CSW received insurance authorization number from [employer] representative. CSW arranged non-emergent ambulance transport. Pt, pt family, pt nurse, and facility informed. CSW signing off.  [place], LCSWA Weekend CSW [phone]

## 2015-07-10 NOTE — Progress Notes (Signed)
Orthopedic Tech Progress Note Patient Details:  Dominique Mckee 1929/11/14 [id number]  Ortho Devices Type of Ortho Device: Sling immobilizer Ortho Device/Splint Location: Replacement sling Ortho Device/Splint Interventions: Application   Cammer, Theodoro Parma 07/10/2015, 10:17 AM

## 2015-07-10 NOTE — Evaluation (Signed)
Occupational Therapy Evaluation Patient Details Name: Dominique Mckee MRN: [id number] DOB: 02/20/1930 Today's Date: 07/10/2015    History of Present Illness 81 y.o. female with a past medical history of hypertension, rheumatoid arthritis, benign essential tremor, GERD who presented to the emergency department with worsening pain in her right shoulder. Patient had been seen in the emergency department about a week prior to admission by Dr. Tyrone Nine who was unable to reduce the shoulder. The patient reports that she was later attempting to pull up her pants when her shoulder popped out again. She then went to the emergency department for further treatment.    Clinical Impression   This 81 yo admitted for above presents to acute OT with decreased use of RUE, decreased balance, decreased mobility, obesity all affecting her ability to care for herself. She will benefit from acute OT with follow up OT at SNF to get to a Mod I to independent level.    Follow Up Recommendations  SNF    Equipment Recommendations   (TBD at next venue)       Precautions / Restrictions Precautions Precautions: Shoulder Shoulder Interventions: Shoulder sling/immobilizer;At all times Required Braces or Orthoses: Sling;Other Brace/Splint Other Brace/Splint: Pt also has Bil ankle braces (in her closet) that she normally will wear with her shoes Restrictions Weight Bearing Restrictions: Yes RUE Weight Bearing: Non weight bearing      Mobility Bed Mobility Overal bed mobility: Needs Assistance Bed Mobility: Supine to Sit;Sit to Supine     Supine to sit: Mod assist Sit to supine: Mod assist;+2 for physical assistance      Transfers Overall transfer level: Needs assistance Equipment used: 1 person hand held assist Transfers: Sit to/from Stand Sit to Stand: Min assist         General transfer comment: Pt ambulated with +1 mod A with another +1 for safety and to follow with recliner    Balance Overall  balance assessment: Needs assistance Sitting-balance support: Single extremity supported;Feet supported Sitting balance-Leahy Scale: Fair     Standing balance support: Single extremity supported;During functional activity Standing balance-Leahy Scale: Poor                              ADL Overall ADL's : Needs assistance/impaired Eating/Feeding: Set up;Sitting   Grooming: Moderate assistance;Sitting   Upper Body Bathing: Moderate assistance;Sitting   Lower Body Bathing: Total assistance (with min A sit<>stand with as much as Mod A to maintain standing at times)   Upper Body Dressing : Total assistance;Sitting   Lower Body Dressing: Total assistance Lower Body Dressing Details (indicate cue type and reason): with min A sit<>stand with as much as Mod A to maintain standing at times Toilet Transfer: Moderate assistance;Ambulation (one person HHA with belt too)   Toileting- Clothing Manipulation and Hygiene: Maximal assistance (with min A sit<>stand with as much as Mod A to maintain standing at times)               Vision Vision Assessment?: Yes Additional Comments: No change from baseline          Pertinent Vitals/Pain Pain Assessment: 0-10 Pain Score: 3  Pain Location: right shoulder Pain Descriptors / Indicators: Aching;Sore Pain Intervention(s): Monitored during session     Hand Dominance Right   Extremity/Trunk Assessment Upper Extremity Assessment Upper Extremity Assessment: RUE deficits/detail RUE Deficits / Details: arm in immobilzer sling at present RUE Coordination: decreased gross motor;decreased fine motor  Communication Communication Communication: No difficulties   Cognition Arousal/Alertness: Awake/alert Behavior During Therapy: WFL for tasks assessed/performed Overall Cognitive Status: Within Functional Limits for tasks assessed                                Home Living Family/patient expects to be  discharged to:: Skilled nursing facility                                        Prior Functioning/Environment Level of Independence: Independent with assistive device(s)        Comments: previously using rw    OT Diagnosis: Generalized weakness;Acute pain   OT Problem List: Decreased strength;Decreased range of motion;Impaired balance (sitting and/or standing);Pain;Impaired UE functional use;Obesity   OT Treatment/Interventions: Self-care/ADL training;Patient/family education;Balance training;Therapeutic activities;Therapeutic exercise;DME and/or AE instruction    OT Goals(Current goals can be found in the care plan section) Acute Rehab OT Goals Patient Stated Goal: to rehab then back home OT Goal Formulation: With patient Time For Goal Achievement: 07/17/15 Potential to Achieve Goals: Good  OT Frequency: Min 3X/week   Barriers to D/C: Decreased caregiver support             End of Session Equipment Utilized During Treatment: Gait belt (sling) Nurse Communication:  (replaced bandage on posterior of right thigh (skin tear))  Activity Tolerance: Patient tolerated treatment well Patient left: in bed;with call bell/phone within reach;with bed alarm set   Time: 1004-1026 OT Time Calculation (min): 22 min Charges:  OT General Charges $OT Visit: 1 Procedure OT Evaluation $Initial OT Evaluation Tier I: 1 Procedure  Almon Register [phone] 07/10/2015, 12:46 PM

## 2015-07-10 NOTE — Progress Notes (Signed)
Patient discharged to [facility] via [facility]. Report taken by Clarene Critchley at the facility.

## 2015-07-10 NOTE — Discharge Summary (Addendum)
Physician Discharge Summary  Dominique Mckee IHK:[id number] DOB: [birth year] DOA: 07/05/2015  PCP: Dorian Heckle, MD  Admit date: 07/05/2015 Discharge date: 07/10/2015  Recommendations for Outpatient Follow-up:  1. Transfer to SNF for ongoing PT/OT 2. Daily weights, notify supervising physician if she gains more than 3 pounds in 1 day or 5 pounds in 1 week 3. Arm in sling at all times, no weight bearing until follow up with Dr. Marlou Sa  Discharge Diagnoses:  Principal Problem:   Shoulder dislocation, recurrent Active Problems:   Essential and other specified forms of tremor   HTN (hypertension)   Rheumatoid arthritis   GERD (gastroesophageal reflux disease)   Shoulder dislocation   Acute diastolic congestive heart failure   Acute respiratory failure with hypoxia   Discharge Condition: Stable, improved  Diet recommendation: Healthy heart  Wt Readings from Last 3 Encounters:  07/10/15 88.8 kg (195 lb 12.3 oz)  02/28/15 89.449 kg (197 lb 3.2 oz)  08/16/14 86.002 kg (189 lb 9.6 oz)    History of present illness:  Dominique Mckee is a 81 y.o. female with a past medical history of hypertension, rheumatoid arthritis, benign essential tremor, GERD who presented to the emergency department with worsening pain in her right shoulder. Patient had been seen in the emergency department about a week prior to admission by Dr. Tyrone Nine who was unable to reduce the shoulder. At home while moving her arm, she felt "a pop" in her right shoulder with significant worsening of the pain which prompted her to return to the emergency department.    Hospital Course:   Shoulder dislocation, recurrent/worsening.  She underwent MRI of the shoulder which demonstrated an anterior dislocation of the right humeral head. The humeral head was impacted onto the anterior rim of the glenoid with a deep Hill-Sachs lesion. There was a full-thickness tear of the inferior fibers of the teres minor muscle and there were strains of  all of the muscles of the rotator cuff.  Orthopedic surgery was consulted. She underwent closed reduction of her shoulder on 9/9 without complication. She has been placed in a sling and should be nonweightbearing of the right upper extremity pending follow-up with orthopedic surgery.  Essential tremor, her propranolol was decreased to 20 mg twice a day secondary to low blood pressure.    Acute hypoxic respiratory failure secondary to probable acute diastolic heart failure given vascular congestion seen on initial CXR.  This was likely secondary to IV fluids that she received in the emergency department and after admission to the hospital. Respiratory distress improved with diuresis.  Her echocardiogram demonstrated a preserved ejection fraction but grade 2 diastolic dysfunction. She also had mild mitral valve regurgitation and borderline elevated pulmonary artery pressures at 35 mmHg. She was given one dose of lasix 20mg  IV once with fast improvement.  She has been restarted on her HCTZ that she was previously taking and tolerating well. Recommended that she follow-up with her primary care doctor in 1-2 weeks and continue to consume a low sodium diet.  She was stable on room air at the time of discharge. She should have daily weights and if she gains more than 3 pounds in 1 day or 5 pounds in 1 week, the supervising physician should be notified.  HTN (hypertension), blood pressure low normal to mildly hypotensive, her Norvasc and benazepril were discontinued and her propranolol dose was reduced.  Rheumatoid arthritis, stable. Her methotrexate which is normally given on Fridays was held secondary to her surgery, and may  be resumed next week. If she has symptoms of rheumatoid arthritis flare in the meantime, she could be given her dose at that time since she did not have an open procedure.  She continued plaquenil.  GERD (gastroesophageal reflux disease), stable, continue PPI  Consultants:  Orthopedics,  Dr. Marlou Sa  Procedures:  MRI right shoulder on 9/7  Right shoulder closed reduction on 9/9  Antibiotics:  none  Discharge Exam: Filed Vitals:   07/10/15 0518  BP: 116/49  Pulse: 57  Temp: 98 F (36.7 C)  Resp: 18   Filed Vitals:   07/09/15 0500 07/09/15 1309 07/09/15 2041 07/10/15 0518  BP:  113/42 116/41 116/49  Pulse:  65 65 57  Temp:  99.1 F (37.3 C) 98.5 F (36.9 C) 98 F (36.7 C)  TempSrc:  Oral Oral Oral  Resp:  18 18 18   Weight: 88.451 kg (195 lb)   88.8 kg (195 lb 12.3 oz)  SpO2:  92% 90% 93%     General: Adult female, No acute distress  HEENT: NCAT, MMM  Cardiovascular: RRR, nl S1, S2, 2/6 systolic murmur, 2+ pulses, warm extremities  Respiratory: Decreased breath sounds at the bases, no wheezes, rhonchi, or rales, no increased WOB  Abdomen: NABS, soft, NT/ND  MSK: Normal tone and bulk, no LEE, right shoulder is in a sling which is tightly banded around her chest. Sensation intact light touch and pulse 2+  Neuro: Able to wiggle fingers and has grip with the right hand  Discharge Instructions      Discharge Instructions    (HEART FAILURE PATIENTS) Call MD:  Anytime you have any of the following symptoms: 1) 3 pound weight gain in 24 hours or 5 pounds in 1 week 2) shortness of breath, with or without a dry hacking cough 3) swelling in the hands, feet or stomach 4) if you have to sleep on extra pillows at night in order to breathe.    Complete by:  As directed      Call MD for:  difficulty breathing, headache or visual disturbances    Complete by:  As directed      Call MD for:  extreme fatigue    Complete by:  As directed      Call MD for:  hives    Complete by:  As directed      Call MD for:  persistant dizziness or light-headedness    Complete by:  As directed      Call MD for:  persistant nausea and vomiting    Complete by:  As directed      Call MD for:  severe uncontrolled pain    Complete by:  As directed      Call MD for:   temperature >100.4    Complete by:  As directed      Diet - low sodium heart healthy    Complete by:  As directed      Driving Restrictions    Complete by:  As directed   No driving     Other Restrictions    Complete by:  As directed   Right arm in sling at all times            Medication List    STOP taking these medications        amLODipine 5 MG tablet  Commonly known as:  NORVASC     benazepril 20 MG tablet  Commonly known as:  LOTENSIN      TAKE these  medications        CALCIUM 500 +D PO  Take 1 tablet by mouth 2 (two) times daily. Calcium 500 mg & Vitamin D 1000 units     diclofenac sodium 1 % Gel  Commonly known as:  VOLTAREN  Apply 1 application topically 2 (two) times daily as needed (pain).     DULoxetine 30 MG capsule  Commonly known as:  CYMBALTA  Take 60 mg by mouth daily.     fentaNYL 25 MCG/HR patch  Commonly known as:  DURAGESIC - dosed mcg/hr  Place 1 patch (25 mcg total) onto the skin every 3 (three) days.     ferrous sulfate 325 (65 FE) MG tablet  Take 325 mg by mouth every evening.     folic acid 1 MG tablet  Commonly known as:  FOLVITE  Take 3 mg by mouth daily.     hydrochlorothiazide 25 MG tablet  Commonly known as:  HYDRODIURIL  Take 12.5 mg by mouth daily.     hydroxychloroquine 200 MG tablet  Commonly known as:  PLAQUENIL  Take 400 mg by mouth daily.     methotrexate 50 MG/2ML injection  Inject 0.7 mLs into the muscle every 7 (seven) days.     Oxycodone HCl 10 MG Tabs  Take 1 tablet (10 mg total) by mouth 3 (three) times daily.     pantoprazole 40 MG tablet  Commonly known as:  PROTONIX  Take 40 mg by mouth daily.     polyethylene glycol powder powder  Commonly known as:  MIRALAX  Take 17 g by mouth daily.     propranolol 20 MG tablet  Commonly known as:  INDERAL  Take 1 tablet (20 mg total) by mouth 2 (two) times daily.     senna 8.6 MG Tabs tablet  Commonly known as:  SENOKOT  Take 2 tablets (17.2 mg total) by  mouth at bedtime.       Follow-up Information    Follow up with Dorian Heckle, MD. Schedule an appointment as soon as possible for a visit in 2 weeks.   Specialty:  Internal Medicine   Contact information:   [address] [place] [address] [phone]       Follow up with Meredith Pel, MD In 10 days.   Specialty:  Orthopedic Surgery   Contact information:   [place] [address] [phone]        The results of significant diagnostics from this hospitalization (including imaging, microbiology, ancillary and laboratory) are listed below for reference.    Significant Diagnostic Studies: Dg Shoulder Right  07/08/2015   CLINICAL DATA:  Surgery. Closed reduction right shoulder. Fluoroscopy time 10 seconds.  EXAM: DG C-ARM 61-120 MIN; RIGHT SHOULDER - 2+ VIEW  COMPARISON:  MRI 07/06/2015  FINDINGS: Three images are performed, showing right humerus within the glenoid fossa. No acute fracture or dislocation. Hill-Sachs deformity again noted.  IMPRESSION: Interval reduction.   Electronically Signed   By: Nolon Nations M.D.   On: 07/08/2015 15:42   Dg Shoulder Right  07/05/2015   CLINICAL DATA:  Pt recently had a right shoulder dislocation and states she has done it again. Pt has severe right shoulder pain,  EXAM: RIGHT SHOULDER - 2+ VIEW  COMPARISON:  None.  FINDINGS: Humeral head positioned abnormally anteriorly and medially as well as inferiorly. No fracture identified. Axillary view could not be performed.  IMPRESSION: Anterior dislocation right humerus   Electronically Signed  By: Skipper Cliche M.D.   On: 07/05/2015 19:40   Dg Shoulder Right  06/23/2015   CLINICAL DATA:  Acute right shoulder injury and pain today. Initial encounter.  EXAM: RIGHT SHOULDER - 2+ VIEW  COMPARISON:  None.  FINDINGS: Anterior inferior subcoracoid dislocation of the right humeral head is noted.  No definite fracture is identified.  IMPRESSION: Anterior inferior  subcoracoid dislocation of the right humeral head.   Electronically Signed   By: Margarette Canada M.D.   On: 06/23/2015 10:43   Dg Wrist Complete Right  06/23/2015   CLINICAL DATA:  Right wrist pain after a right shoulder reduction.  EXAM: RIGHT WRIST - COMPLETE 3+ VIEW  COMPARISON:  None.  FINDINGS: Minimal radial styloid spur formation. No fracture or dislocation seen.  IMPRESSION: No fracture.   Electronically Signed   By: Claudie Revering M.D.   On: 06/23/2015 14:56   Mr Cervical Spine Wo Contrast  06/21/2015   CLINICAL DATA:  Right lateral neck pain, shoulder pain, arm pain  EXAM: MRI CERVICAL SPINE WITHOUT CONTRAST  TECHNIQUE: Multiplanar, multisequence MR imaging of the cervical spine was performed. No intravenous contrast was administered.  COMPARISON:  None.  FINDINGS: The cervical cord is normal in size and signal. Vertebral body heights are maintained. There is degenerative disc disease with disc height loss at C3-4, C4-5, C5-6 and C6-7. The cervical spine is normal in lordotic alignment. There is 2 mm anterolisthesis of C3 on C4 secondary to facet disease. There is 2 mm of retrolisthesis of C5 on C6. Bone marrow signal is normal. Cerebellar tonsils are normal in position.  C2-3: No significant disc bulge. No neural foraminal stenosis. No central canal stenosis. Mild left facet arthropathy.  C3-4: Small left paracentral disc protrusion abutting the ventral cervical spinal cord. Moderate left facet arthropathy and moderate left foraminal stenosis. No right foraminal stenosis. Mild spinal stenosis.  C4-5: Mild broad-based disc osteophyte complex. Mild bilateral facet arthropathy and right uncovertebral degenerative change resulting in moderate right and mild left foraminal narrowing. Mild central canal narrowing.  C5-6: Mild broad-based disc osteophyte complex. Bilateral uncovertebral degenerative changes with mild-moderate bilateral foraminal stenosis. Mild central canal narrowing.  C6-7: Broad-based disc  osteophyte complex with bilateral uncovertebral degenerative changes. No neural foraminal stenosis. No central canal stenosis.  C7-T1: No significant disc bulge. No neural foraminal stenosis. No central canal stenosis.  IMPRESSION: 1. Cervical spondylosis as described above. 2. At C3-4 there is a small left paracentral disc protrusion abutting the ventral cervical spinal cord. Moderate left facet arthropathy and moderate left foraminal stenosis. No right foraminal stenosis. Mild spinal stenosis. 3. At C4-5 there is a mild broad-based disc osteophyte complex. Mild bilateral facet arthropathy and right uncovertebral degenerative change resulting in moderate right and mild left foraminal narrowing. 4. At C5-6 there is a mild broad-based disc osteophyte complex. Bilateral uncovertebral degenerative changes with mild-moderate bilateral foraminal stenosis.   Electronically Signed   By: Kathreen Devoid   On: 06/21/2015 08:26   Mr Shoulder Right Wo Contrast  07/06/2015   CLINICAL DATA:  Severe right shoulder pain since anterior dislocations on 06/23/2015 and 07/05/2015.  EXAM: MRI OF THE RIGHT SHOULDER WITHOUT CONTRAST  TECHNIQUE: Multiplanar, multisequence MR imaging of the shoulder was performed. No intravenous contrast was administered.  COMPARISON:  Radiographs dated 07/05/2015 and 06/23/2015  FINDINGS: Bones: There is anterior dislocation of the right humeral head. There is a deep Hill-Sachs impaction fracture of the posterior lateral aspect of the humeral head. The humeral  head its lodged in the anterior inferior aspect of the glenoid. There is no appreciable glenoid rim fracture.  Rotator cuff: There is a full-thickness tear of the inferior fibers of the teres minor muscle as visualized on images 21 through 25 of series 9. The supraspinous and infraspinatus and subscapularis tendons are mostly intact although there is an intrasubstance longitudinal tear in the infraspinatus best seen on images 16 through 19 of series  10. The visualization of the supraspinous tendon is suboptimal due to the distorted anatomy.  Muscles: There is extensive edema in and around the subscapularis muscle and to a lesser degree along the infraspinatus and supraspinous muscles.  Biceps long head: The long head of the biceps tendon is dislocated lateral to the bicipital groove.  Acromioclavicular Joint:  Normal.  Glenohumeral Joint: Large glenohumeral joint effusion.  Labrum:  Not well seen.  No discrete labral tear.  IMPRESSION: Anterior dislocation of the right humeral head. The humeral head is impacted onto the anterior rim of the glenoid with a deep Hill-Sachs lesion.  Full-thickness tear of the inferior fibers of the teres minor muscle.  Strains of all of the muscles of the rotator cuff.   Electronically Signed   By: Lorriane Shire M.D.   On: 07/06/2015 08:49   Dg Chest Port 1 View  07/05/2015   CLINICAL DATA:  81 year old female with hypoxia  EXAM: PORTABLE CHEST - 1 VIEW  COMPARISON:  Chest radiograph dated 11/19/2013  FINDINGS: Single-view of the chest demonstrate increased interstitial and vascular prominence, possibly related to a degree of congestive changes. There is no focal consolidation, pleural effusion, or pneumothorax. Stable cardiac silhouette. There is dislocation of the right shoulder.  IMPRESSION: Diffuse interstitial prominence possibly mild congestive changes.  Right shoulder dislocation.   Electronically Signed   By: Anner Crete M.D.   On: 07/05/2015 22:28   Dg Shoulder Right Port  06/23/2015   CLINICAL DATA:  Post reduction of right shoulder.  EXAM: PORTABLE RIGHT SHOULDER - 2+ VIEW  COMPARISON:  Earlier today.  FINDINGS: There is persistent residual anterior subluxation of the right shoulder, with associated superior migration of the humeral head on the glenoid. No fracture is seen.  IMPRESSION: Persistent residual anterior subluxation of the right shoulder.   Electronically Signed   By: Fidela Salisbury M.D.   On:  06/23/2015 14:59   Dg Shoulder Right Port  06/23/2015   CLINICAL DATA:  Status post reduction of a right shoulder dislocation.  EXAM: PORTABLE RIGHT SHOULDER - 2+ VIEW  COMPARISON:  Earlier today.  FINDINGS: The previously seen anterior dislocation has been partially reduced, with residual anterior subluxation and superior migration. No fracture seen.  IMPRESSION: Partially reduced anterior dislocation.   Electronically Signed   By: Claudie Revering M.D.   On: 06/23/2015 12:28   Dg C-arm 1-60 Min  07/08/2015   CLINICAL DATA:  Surgery. Closed reduction right shoulder. Fluoroscopy time 10 seconds.  EXAM: DG C-ARM 61-120 MIN; RIGHT SHOULDER - 2+ VIEW  COMPARISON:  MRI 07/06/2015  FINDINGS: Three images are performed, showing right humerus within the glenoid fossa. No acute fracture or dislocation. Hill-Sachs deformity again noted.  IMPRESSION: Interval reduction.   Electronically Signed   By: Nolon Nations M.D.   On: 07/08/2015 15:42    Microbiology: Recent Results (from the past 240 hour(s))  Surgical pcr screen     Status: None   Collection Time: 07/07/15  6:56 AM  Result Value Ref Range Status   MRSA, PCR NEGATIVE  NEGATIVE Final   Staphylococcus aureus NEGATIVE NEGATIVE Final    Comment:        The Xpert SA Assay (FDA approved for NASAL specimens in patients over 34 years of age), is one component of a comprehensive surveillance program.  Test performance has been validated by [facility] for patients greater than or equal to 85 year old. It is not intended to diagnose infection nor to guide or monitor treatment.      Labs: Basic Metabolic Panel:  Recent Labs Lab 07/05/15 2221 07/06/15 0332 07/07/15 0515 07/08/15 0459 07/09/15 0430 07/10/15 0600  NA 138  --  138 137 137 142  K 4.6  --  4.0 3.8 4.0 4.0  CL 106  --  102 99* 104 106  CO2 23  --  28 28 24 27   GLUCOSE 125*  --  101* 94 99 92  BUN 42*  --  26* 37* 27* 28*  CREATININE 1.27* 1.32* 1.10* 1.55* 1.16* 1.10*   CALCIUM 9.1  --  8.7* 8.2* 8.0* 8.7*   Liver Function Tests: No results for input(s): AST, ALT, ALKPHOS, BILITOT, PROT, ALBUMIN in the last 168 hours. No results for input(s): LIPASE, AMYLASE in the last 168 hours. No results for input(s): AMMONIA in the last 168 hours. CBC:  Recent Labs Lab 07/05/15 2100  07/06/15 0332 07/07/15 0515 07/08/15 0459 07/09/15 0430 07/10/15 0600  WBC 12.0*  --  11.9* 10.7* 10.7* 11.6* 9.5  NEUTROABS 9.8*  --   --   --   --   --   --   HGB 11.1*  < > 11.0* 10.3* 10.2* 9.9* 10.8*  HCT 34.3*  < > 35.2* 33.1* 31.7* 30.6* 33.3*  MCV 99.1  --  98.3 99.4 99.1 99.7 99.1  PLT 270  --  243 239 201 183 235  < > = values in this interval not displayed. Cardiac Enzymes: No results for input(s): CKTOTAL, CKMB, CKMBINDEX, TROPONINI in the last 168 hours. BNP: BNP (last 3 results) No results for input(s): BNP in the last 8760 hours.  ProBNP (last 3 results) No results for input(s): PROBNP in the last 8760 hours.  CBG: No results for input(s): GLUCAP in the last 168 hours.  Time coordinating discharge: 35 minutes  Signed:  Caidyn Blossom  [facility] 07/10/2015, 12:07 PM

## 2015-07-10 NOTE — Progress Notes (Signed)
CSW (Clinical Education officer, museum) notified by pt nurse that MD has discharged pt to SNF. CSW was able to reach pt requested facility [facility] and confirmed that admissions staff will make themselves available today to admit pt. CSW left voicemail for pt insurance representative. Awaiting return phone call to notify of plan and arrange dc.  [place], LCSWA Weekend CSW [phone]

## 2015-07-11 ENCOUNTER — Encounter (HOSPITAL_COMMUNITY): Payer: Self-pay | Admitting: Orthopedic Surgery

## 2015-07-12 ENCOUNTER — Encounter: Payer: Self-pay | Admitting: Internal Medicine

## 2015-07-12 ENCOUNTER — Non-Acute Institutional Stay (SKILLED_NURSING_FACILITY): Payer: PPO | Admitting: Internal Medicine

## 2015-07-12 DIAGNOSIS — J9601 Acute respiratory failure with hypoxia: Secondary | ICD-10-CM

## 2015-07-12 DIAGNOSIS — I10 Essential (primary) hypertension: Secondary | ICD-10-CM | POA: Diagnosis not present

## 2015-07-12 DIAGNOSIS — G252 Other specified forms of tremor: Secondary | ICD-10-CM

## 2015-07-12 DIAGNOSIS — M24411 Recurrent dislocation, right shoulder: Secondary | ICD-10-CM

## 2015-07-12 DIAGNOSIS — M069 Rheumatoid arthritis, unspecified: Secondary | ICD-10-CM

## 2015-07-12 DIAGNOSIS — G250 Essential tremor: Secondary | ICD-10-CM

## 2015-07-12 DIAGNOSIS — I5031 Acute diastolic (congestive) heart failure: Secondary | ICD-10-CM | POA: Diagnosis not present

## 2015-07-12 DIAGNOSIS — K219 Gastro-esophageal reflux disease without esophagitis: Secondary | ICD-10-CM | POA: Diagnosis not present

## 2015-07-12 DIAGNOSIS — R251 Tremor, unspecified: Secondary | ICD-10-CM | POA: Diagnosis not present

## 2015-07-12 NOTE — Assessment & Plan Note (Signed)
SNF - cont protonix 40 mg

## 2015-07-12 NOTE — Assessment & Plan Note (Addendum)
Vascular congestion seen on initial CXR. This was likely secondary to IV fluids that she received in the emergency department and after admission to the hospital. Respiratory distress improved with diuresis. Her echocardiogram demonstrated a preserved ejection fraction but grade 2 diastolic dysfunction. She also had mild mitral valve regurgitation and borderline elevated pulmonary artery pressures at 35 mmHg. She was given one dose of lasix 20mg  IV once with fast improvement. She has been restarted on her HCTZ that she was previously taking and tolerating well. She was stable on room air at the time of discharge. SNF - low sodium diet, daily weights for fluid gain

## 2015-07-12 NOTE — Assessment & Plan Note (Signed)
/  worsening. She underwent MRI of the shoulder which demonstrated an anterior dislocation of the right humeral head. The humeral head was impacted onto the anterior rim of the glenoid with a deep Hill-Sachs lesion. There was a full-thickness tear of the inferior fibers of the teres minor muscle and there were strains of all of the muscles of the rotator cuff. Orthopedic surgery was consulted. She underwent closed reduction of her shoulder on 9/9 without complication. She has been placed in a sling and should be nonweightbearing of the right upper extremity pending follow-up with orthopedic surgery. SNF - OT/PT

## 2015-07-12 NOTE — Assessment & Plan Note (Addendum)
secondary to probable acute diastolic heart failure given vascular congestion seen on initial CXR. This was likely secondary to IV fluids that she received in the emergency department and after admission to the hospital. Respiratory distress improved with diuresis. Her echocardiogram demonstrated a preserved ejection fraction but grade 2 diastolic dysfunction. She also had mild mitral valve regurgitation and borderline elevated pulmonary artery pressures at 35 mmHg. She was given one dose of lasix 20mg  IV once with fast improvement. She has been restarted on her HCTZ that she was previously taking and tolerating well. She was stable on room air at the time of discharge. SNF  daily weights and if gains more than 3 pounds in 1 day or 5 pounds in 1 week, prn lasix

## 2015-07-12 NOTE — Assessment & Plan Note (Signed)
SNF - cont propranolol 20 mg BID , which represents a dose reduction 2/2 low BP

## 2015-07-12 NOTE — Assessment & Plan Note (Addendum)
Norvasc and benazepril were discontinued and her propranolol dose was reduced SNF - continue propranolol at reduced dose of 20 mg BID; follow BP closely, check BMP

## 2015-07-12 NOTE — Assessment & Plan Note (Signed)
Stable. SNF - methotrexate (normally given on Fridays) was held secondary to her surgery;may be resumed next week. If she has symptoms of rheumatoid arthritis flare in the meantime,  her dose can be given at that time since she did not have an open procedure; continue plaquenil.

## 2015-07-12 NOTE — Progress Notes (Signed)
MRN: [id number] Name: Dominique Mckee  Sex: female Age: 81 y.o. DOB: [birth year]  [place] #: [facility] Facility/Room:105 Level Of Care: SNF Provider: Inocencio Homes D Emergency Contacts: Extended Emergency Contact Information Primary Emergency Contact: Harris,Jeff Address: [address]          [place], [place] [address] Johnnet[place] of [place] Phone: [phone] Relation: Son Secondary Emergency Contact: Hacker,Caroline  [place] Phone: [phone] Relation: Grandaughter  Code Status:   Allergies: Feldene; Clinoril; and Morphine and related  Chief Complaint  Patient presents with  . New Admit To SNF    HPI: Patient is 81 y.o. female with past medical history of hypertension, rheumatoid arthritis, benign essential tremor, GERD who presented to the emergency department with worsening pain in her right shoulder. Patient had been seen in the emergency department about a week prior to admission by Dr. Tyrone Nine who was unable to reduce the shoulder. At home while moving her arm, she felt "a pop" in her right shoulder with significant worsening of the pain which prompted her to return to the emergency department Pt was admitted to hospital from 9/6-11 where pt was dx with recurrent anterior shoulder dislocation which was surgically repaired.Pt is now admitted to SNF for OT/PT. While at SNF pt will be followed for RA, tx with methotrexate and plaquenil, HTN, tx with propranolol and GERD tx with protonix.   Past Medical History  Diagnosis Date  . Hypertension   . Arthritis   . Degenerative disc disease   . Benign essential tremor   . Migraine headache   . Cervicogenic headache   . Cervical spondylosis   . Lumbar spondylosis   . Gait disorder   . Mild obesity   . GERD (gastroesophageal reflux disease)   . Osteopenia   . Chronic renal insufficiency   . Fibromyalgia   . Rheumatoid arthritis   . Depression     Past Surgical History  Procedure Laterality Date   . Total knee arthroplasty Bilateral     right  . Abdominal hysterectomy    . Cholecystectomy    . Elbow surgery Left   . Hammer toe surgery Left   . Tonsillectomy    . Appendectomy    . Shoulder closed reduction Right 07/08/2015    Procedure: RIGHT SHOULDER CLOSED REDUCTION;  Surgeon: Meredith Pel, MD;  Location: [facility];  Service: Orthopedics;  Laterality: Right;      Medication List       This list is accurate as of: 07/12/15  7:33 PM.  Always use your most recent med list.               CALCIUM 500 +D PO  Take 1 tablet by mouth 2 (two) times daily. Calcium 500 mg & Vitamin D 1000 units     diclofenac sodium 1 % Gel  Commonly known as:  VOLTAREN  Apply 1 application topically 2 (two) times daily as needed (pain).     DULoxetine 30 MG capsule  Commonly known as:  CYMBALTA  Take 60 mg by mouth daily.     fentaNYL 25 MCG/HR patch  Commonly known as:  DURAGESIC - dosed mcg/hr  Place 1 patch (25 mcg total) onto the skin every 3 (three) days.     ferrous sulfate 325 (65 FE) MG tablet  Take 325 mg by mouth every evening.     folic acid 1 MG tablet  Commonly known as:  FOLVITE  Take 3 mg by mouth  daily.     hydrochlorothiazide 25 MG tablet  Commonly known as:  HYDRODIURIL  Take 12.5 mg by mouth daily.     hydroxychloroquine 200 MG tablet  Commonly known as:  PLAQUENIL  Take 400 mg by mouth daily.     methotrexate 50 MG/2ML injection  Inject 0.7 mLs into the muscle every 7 (seven) days.     Oxycodone HCl 10 MG Tabs  Take 1 tablet (10 mg total) by mouth 3 (three) times daily.     pantoprazole 40 MG tablet  Commonly known as:  PROTONIX  Take 40 mg by mouth daily.     polyethylene glycol powder powder  Commonly known as:  MIRALAX  Take 17 g by mouth daily.     propranolol 20 MG tablet  Commonly known as:  INDERAL  Take 1 tablet (20 mg total) by mouth 2 (two) times daily.     senna 8.6 MG Tabs tablet  Commonly known as:  SENOKOT  Take 2 tablets (17.2 mg  total) by mouth at bedtime.        No orders of the defined types were placed in this encounter.    Immunization History  Administered Date(s) Administered  . Influenza,inj,Quad PF,36+ Mos 07/10/2015    Social History  Substance Use Topics  . Smoking status: Never Smoker   . Smokeless tobacco: Never Used  . Alcohol Use: No    Family history is + AAA,CA  Review of Systems  DATA OBTAINED: from patient, nurse GENERAL:  no fevers, fatigue, appetite changes SKIN: No itching, rash or wounds EYES: No eye pain, redness, discharge EARS: No earache, tinnitus, change in hearing NOSE: No congestion, drainage or bleeding  MOUTH/THROAT: No mouth or tooth pain, No sore throat RESPIRATORY: No cough, wheezing, SOB CARDIAC: No chest pain, palpitations, lower extremity edema  GI: No abdominal pain, No N/V/D or constipation, No heartburn or reflux  GU: No dysuria, frequency or urgency, or incontinence  MUSCULOSKELETAL: No unrelieved bone/joint pain NEUROLOGIC: No headache, dizziness or focal weakness PSYCHIATRIC: No c/o anxiety or sadness   Filed Vitals:   07/12/15 1743  BP: 120/70  Pulse: 70  Temp: 97.5 F (36.4 C)  Resp: 18    SpO2 Readings from Last 1 Encounters:  07/10/15 93%        Physical Exam  GENERAL APPEARANCE: Alert, conversant,  No acute distress.  SKIN: No diaphoresis rash HEAD: Normocephalic, atraumatic  EYES: Conjunctiva/lids clear. Pupils round, reactive. EOMs intact.  EARS: External exam WNL, canals clear. Hearing grossly normal.  NOSE: No deformity or discharge.  MOUTH/THROAT: Lips w/o lesions  RESPIRATORY: Breathing is even, unlabored. Lung sounds are clear   CARDIOVASCULAR: Heart RRR no murmurs, rubs or gallops. No peripheral edema.   GASTROINTESTINAL: Abdomen is soft, non-tender, not distended w/ normal bowel sounds. GENITOURINARY: Bladder non tender, not distended  MUSCULOSKELETAL:R arm in sling NEUROLOGIC:  Cranial nerves 2-12 grossly intact.  Moves all extremities  PSYCHIATRIC: Mood and affect appropriate to situation, no behavioral issues  Patient Active Problem List   Diagnosis Date Noted  . Acute diastolic congestive heart failure 07/08/2015  . Acute respiratory failure with hypoxia 07/08/2015  . Shoulder dislocation, recurrent 07/06/2015  . HTN (hypertension) 07/06/2015  . Rheumatoid arthritis 07/06/2015  . GERD (gastroesophageal reflux disease) 07/06/2015  . Shoulder dislocation   . Concussion with no loss of consciousness 12/30/2013  . Cervical spondylosis without myelopathy 01/01/2013  . Migraine without aura 01/01/2013  . Abnormality of gait 01/01/2013  . Essential and  other specified forms of tremor 01/01/2013    CBC    Component Value Date/Time   WBC 9.5 07/10/2015 0600   RBC 3.36* 07/10/2015 0600   HGB 10.8* 07/10/2015 0600   HCT 33.3* 07/10/2015 0600   PLT 235 07/10/2015 0600   MCV 99.1 07/10/2015 0600   LYMPHSABS 1.1 07/05/2015 2100   MONOABS 0.9 07/05/2015 2100   EOSABS 0.2 07/05/2015 2100   BASOSABS 0.0 07/05/2015 2100    CMP     Component Value Date/Time   NA 142 07/10/2015 0600   K 4.0 07/10/2015 0600   CL 106 07/10/2015 0600   CO2 27 07/10/2015 0600   GLUCOSE 92 07/10/2015 0600   BUN 28* 07/10/2015 0600   CREATININE 1.10* 07/10/2015 0600   CALCIUM 8.7* 07/10/2015 0600   PROT 6.9 05/30/2011 0847   ALBUMIN 3.1* 05/30/2011 0847   AST 13 05/30/2011 0847   ALT 7 05/30/2011 0847   ALKPHOS 81 05/30/2011 0847   BILITOT 0.3 05/30/2011 0847   GFRNONAA 45* 07/10/2015 0600   GFRAA 52* 07/10/2015 0600    Lab Results  Component Value Date   HGBA1C 6.4* 05/30/2011     Dg Shoulder Right  07/05/2015   CLINICAL DATA:  Pt recently had a right shoulder dislocation and states she has done it again. Pt has severe right shoulder pain,  EXAM: RIGHT SHOULDER - 2+ VIEW  COMPARISON:  None.  FINDINGS: Humeral head positioned abnormally anteriorly and medially as well as inferiorly. No fracture identified.  Axillary view could not be performed.  IMPRESSION: Anterior dislocation right humerus   Electronically Signed   By: Skipper Cliche M.D.   On: 07/05/2015 19:40   Mr Shoulder Right Wo Contrast  07/06/2015   CLINICAL DATA:  Severe right shoulder pain since anterior dislocations on 06/23/2015 and 07/05/2015.  EXAM: MRI OF THE RIGHT SHOULDER WITHOUT CONTRAST  TECHNIQUE: Multiplanar, multisequence MR imaging of the shoulder was performed. No intravenous contrast was administered.  COMPARISON:  Radiographs dated 07/05/2015 and 06/23/2015  FINDINGS: Bones: There is anterior dislocation of the right humeral head. There is a deep Hill-Sachs impaction fracture of the posterior lateral aspect of the humeral head. The humeral head its lodged in the anterior inferior aspect of the glenoid. There is no appreciable glenoid rim fracture.  Rotator cuff: There is a full-thickness tear of the inferior fibers of the teres minor muscle as visualized on images 21 through 25 of series 9. The supraspinous and infraspinatus and subscapularis tendons are mostly intact although there is an intrasubstance longitudinal tear in the infraspinatus best seen on images 16 through 19 of series 10. The visualization of the supraspinous tendon is suboptimal due to the distorted anatomy.  Muscles: There is extensive edema in and around the subscapularis muscle and to a lesser degree along the infraspinatus and supraspinous muscles.  Biceps long head: The long head of the biceps tendon is dislocated lateral to the bicipital groove.  Acromioclavicular Joint:  Normal.  Glenohumeral Joint: Large glenohumeral joint effusion.  Labrum:  Not well seen.  No discrete labral tear.  IMPRESSION: Anterior dislocation of the right humeral head. The humeral head is impacted onto the anterior rim of the glenoid with a deep Hill-Sachs lesion.  Full-thickness tear of the inferior fibers of the teres minor muscle.  Strains of all of the muscles of the rotator cuff.    Electronically Signed   By: Lorriane Shire M.D.   On: 07/06/2015 08:49   Dg Chest [facility]  07/05/2015   CLINICAL DATA:  81 year old female with hypoxia  EXAM: PORTABLE CHEST - 1 VIEW  COMPARISON:  Chest radiograph dated 11/19/2013  FINDINGS: Single-view of the chest demonstrate increased interstitial and vascular prominence, possibly related to a degree of congestive changes. There is no focal consolidation, pleural effusion, or pneumothorax. Stable cardiac silhouette. There is dislocation of the right shoulder.  IMPRESSION: Diffuse interstitial prominence possibly mild congestive changes.  Right shoulder dislocation.   Electronically Signed   By: Anner Crete M.D.   On: 07/05/2015 22:28    Not all labs, radiology exams or other studies done during hospitalization come through on my EPIC note; however they are reviewed by me.    Assessment and Plan  Shoulder dislocation, recurrent /worsening. She underwent MRI of the shoulder which demonstrated an anterior dislocation of the right humeral head. The humeral head was impacted onto the anterior rim of the glenoid with a deep Hill-Sachs lesion. There was a full-thickness tear of the inferior fibers of the teres minor muscle and there were strains of all of the muscles of the rotator cuff. Orthopedic surgery was consulted. She underwent closed reduction of her shoulder on 9/9 without complication. She has been placed in a sling and should be nonweightbearing of the right upper extremity pending follow-up with orthopedic surgery. SNF - OT/PT  Acute respiratory failure with hypoxia secondary to probable acute diastolic heart failure given vascular congestion seen on initial CXR. This was likely secondary to IV fluids that she received in the emergency department and after admission to the hospital. Respiratory distress improved with diuresis. Her echocardiogram demonstrated a preserved ejection fraction but grade 2 diastolic dysfunction. She also  had mild mitral valve regurgitation and borderline elevated pulmonary artery pressures at 35 mmHg. She was given one dose of lasix 20mg  IV once with fast improvement. She has been restarted on her HCTZ that she was previously taking and tolerating well. Recommended that she follow-up with her primary care doctor in 1-2 weeks and continue to consume a low sodium diet. She was stable on room air at the time of discharge. SNF  daily weights and if gains more than 3 pounds in 1 day or 5 pounds in 1 week, prn lasix   Acute diastolic congestive heart failure Vascular congestion seen on initial CXR. This was likely secondary to IV fluids that she received in the emergency department and after admission to the hospital. Respiratory distress improved with diuresis. Her echocardiogram demonstrated a preserved ejection fraction but grade 2 diastolic dysfunction. She also had mild mitral valve regurgitation and borderline elevated pulmonary artery pressures at 35 mmHg. She was given one dose of lasix 20mg  IV once with fast improvement. She has been restarted on her HCTZ that she was previously taking and tolerating well. Recommended that she follow-up with her primary care doctor in 1-2 weeks She was stable on room air at the time of discharge. SNF - low sodiun diet, daily weights and if she  more than 3 pounds in 1 day or 5 pounds in 1 week, lasix to be ordered   Essential and other specified forms of tremor SNF - cont propranolol 20 mg BID , which represents a dose reduction 2/2 low BP  HTN (hypertension) Norvasc and benazepril were discontinued and her propranolol dose was reduced SNF - continue propranolol at reduced dose of 20 mg BID  Rheumatoid arthritis Stable. SNF - methotrexate (normally given on Fridays) was held secondary to her surgery;may be resumed next week. If  she has symptoms of rheumatoid arthritis flare in the meantime,  her dose can be given at that time since she did not have an open  procedure; continue plaquenil.   GERD (gastroesophageal reflux disease) SNF - cont protonix 40 mg   Time spent > 35 min Hennie Duos, MD

## 2015-08-22 ENCOUNTER — Encounter: Payer: Self-pay | Admitting: Internal Medicine

## 2015-08-22 ENCOUNTER — Non-Acute Institutional Stay (SKILLED_NURSING_FACILITY): Payer: PPO | Admitting: Internal Medicine

## 2015-08-22 DIAGNOSIS — M069 Rheumatoid arthritis, unspecified: Secondary | ICD-10-CM

## 2015-08-22 DIAGNOSIS — I10 Essential (primary) hypertension: Secondary | ICD-10-CM | POA: Diagnosis not present

## 2015-08-22 DIAGNOSIS — M24411 Recurrent dislocation, right shoulder: Secondary | ICD-10-CM | POA: Diagnosis not present

## 2015-08-22 DIAGNOSIS — I5031 Acute diastolic (congestive) heart failure: Secondary | ICD-10-CM

## 2015-08-22 NOTE — Progress Notes (Signed)
Patient ID: Dominique Mckee, female   DOB: [birth year], 81 y.o.   MRN: [id number] MRN: [id number] Name: Dominique Mckee  Sex: female Age: 81 y.o. DOB: 1930/09/28  [place] #: [facility] Facility/Room:105 Level Of Care: SNF Provider: Wille Celeste Emergency Contacts: Extended Emergency Contact Information Primary Emergency Contact: Harris,Jeff Address: [address]          [place], [place] [address] [place] Phone: [phone] Relation: Son Secondary Emergency Contact: Hacker,Caroline  [place] Phone: [phone] Relation: Grandaughter  Code Status:   Allergies: Feldene; Clinoril; and Morphine and related  Chief Complaint  Patient presents with  . Discharge Note    HPI: Patient is 81 y.o. female with past medical history of hypertension, rheumatoid arthritis, benign essential tremor, GERD who presented to the emergency department with worsening pain in her right shoulder. Patient had been seen in the emergency department about a week prior to admission by Dr. Tyrone Nine who was unable to reduce the shoulder. At home while moving her arm, she felt "a pop" in her right shoulder with significant worsening of the pain which prompted her to return to the emergency department Pt was admitted to hospital from 9/6-11 where pt was dx with recurrent anterior shoulder dislocation which was surgically repaired.Pt  admitted to SNF for OT/PT. While at SNF pt  followed for RA, tx with methotrexate and plaquenil, HTN, tx with propranolol and GERD tx with protonix. Her stay here apparently has been fairly uneventful she has made progress with physical therapy but will need continued PT and OT as well as CNA support secondary to her limited ability due to ADLs    Past Medical History  Diagnosis Date  . Hypertension   . Arthritis   . Degenerative disc disease   . Benign essential tremor   . Migraine headache   . Cervicogenic headache   . Cervical spondylosis   .  Lumbar spondylosis   . Gait disorder   . Mild obesity   . GERD (gastroesophageal reflux disease)   . Osteopenia   . Chronic renal insufficiency   . Fibromyalgia   . Rheumatoid arthritis ([place])   . Depression     Past Surgical History  Procedure Laterality Date  . Total knee arthroplasty Bilateral     right  . Abdominal hysterectomy    . Cholecystectomy    . Elbow surgery Left   . Hammer toe surgery Left   . Tonsillectomy    . Appendectomy    . Shoulder closed reduction Right 07/08/2015    Procedure: RIGHT SHOULDER CLOSED REDUCTION;  Surgeon: Meredith Pel, MD;  Location: [facility];  Service: Orthopedics;  Laterality: Right;      Medication List       This list is accurate as of: 08/22/15 11:59 PM.  Always use your most recent med list.               CALCIUM 500 +D PO  Take 1 tablet by mouth 2 (two) times daily. Calcium 500 mg & Vitamin D 1000 units     diclofenac sodium 1 % Gel  Commonly known as:  VOLTAREN  Apply 1 application topically 2 (two) times daily as needed (pain).     DULoxetine 30 MG capsule  Commonly known as:  CYMBALTA  Take 60 mg by mouth daily.     fentaNYL 25 MCG/HR patch  Commonly known as:  DURAGESIC - dosed mcg/hr  Place 1 patch (25 mcg  total) onto the skin every 3 (three) days.     ferrous sulfate 325 (65 FE) MG tablet  Take 325 mg by mouth every evening.     folic acid 1 MG tablet  Commonly known as:  FOLVITE  Take 3 mg by mouth daily.     hydrochlorothiazide 25 MG tablet  Commonly known as:  HYDRODIURIL  Take 12.5 mg by mouth daily.     hydroxychloroquine 200 MG tablet  Commonly known as:  PLAQUENIL  Take 400 mg by mouth daily.     methotrexate 50 MG/2ML injection  Inject 0.7 mLs into the muscle every 7 (seven) days.     Oxycodone HCl 10 MG Tabs  Take 1 tablet (10 mg total) by mouth 3 (three) times daily.     pantoprazole 40 MG tablet  Commonly known as:  PROTONIX  Take 40 mg by mouth daily.     polyethylene glycol powder  powder  Commonly known as:  MIRALAX  Take 17 g by mouth daily.     propranolol 20 MG tablet  Commonly known as:  INDERAL  Take 1 tablet (20 mg total) by mouth 2 (two) times daily.     senna 8.6 MG Tabs tablet  Commonly known as:  SENOKOT  Take 2 tablets (17.2 mg total) by mouth at bedtime.        No orders of the defined types were placed in this encounter.    Immunization History  Administered Date(s) Administered  . Influenza,inj,Quad PF,36+ Mos 07/10/2015    Social History  Substance Use Topics  . Smoking status: Never Smoker   . Smokeless tobacco: Never Used  . Alcohol Use: No    Family history is + AAA,CA  Review of Systems  DATA OBTAINED: from patient, nurse GENERAL:  no fevers, fatigue, appetite changes SKIN: No itching, rash or wounds EYES: No eye pain, redness, discharge EARS: No earache, tinnitus, change in hearing NOSE: No congestion, drainage or bleeding  MOUTH/THROAT: No mouth or tooth pain, No sore throat RESPIRATORY: No cough, wheezing, SOB CARDIAC: No chest pain, palpitations, lower extremity edema  GI: No abdominal pain, No N/V/D or constipation, No heartburn or reflux  GU: No dysuria, frequency or urgency, or incontinence  MUSCULOSKELETAL: No unrelieved bone/joint pain--had made progress with her right shoulder therapy but still has significant weakness NEUROLOGIC: No headache, dizziness or focal weakness PSYCHIATRIC: No c/o anxiety or sadness   Filed Vitals:   08/22/15 1621  BP: 133/82  Pulse: 72  Temp: 98.1 F (36.7 C)  Resp: 20    SpO2 Readings from Last 1 Encounters:  07/10/15 93%        Physical Exam  GENERAL APPEARANCE: Alert, conversant,  No acute distress.  SKIN: No diaphoresis rash HEAD: Normocephalic, atraumatic  EYES: Conjunctiva/lids clear. Pupils round, reactive. EOMs intact.  EARS: External exam WNL, canals clear. Hearing grossly normal.  NOSE: No deformity or discharge.  MOUTH/THROAT: Lips w/o lesions oropharynx  clear mucous membranes moist  RESPIRATORY: Breathing is even, unlabored. Lung sounds are clear   CARDIOVASCULAR: Heart RRR no murmurs, rubs or gallops. One plus  peripheral edema. --Somewhat reduced pedal pulses bilaterally  GASTROINTESTINAL: Abdomen is soft, non-tender, not distended w/ normal bowel sounds.   MUSCULOSKELETAL:R arm in sling radial pulses intact  Cap Refill intact NEUROLOGIC:  Cranial nerves 2-12 grossly intact. Moves all extremities  PSYCHIATRIC: Mood and affect appropriate to situation, no behavioral issues  Patient Active Problem List   Diagnosis Date Noted  . Acute  diastolic congestive heart failure ([place]) 07/08/2015  . Acute respiratory failure with hypoxia ([place]) 07/08/2015  . Shoulder dislocation, recurrent 07/06/2015  . HTN (hypertension) 07/06/2015  . Rheumatoid arthritis ([place]) 07/06/2015  . GERD (gastroesophageal reflux disease) 07/06/2015  . Shoulder dislocation   . Concussion with no loss of consciousness 12/30/2013  . Cervical spondylosis without myelopathy 01/01/2013  . Migraine without aura 01/01/2013  . Abnormality of gait 01/01/2013  . Essential and other specified forms of tremor 01/01/2013    Labs.   CBC    Component Value Date/Time   WBC 9.5 07/10/2015 0600   RBC 3.36* 07/10/2015 0600   HGB 10.8* 07/10/2015 0600   HCT 33.3* 07/10/2015 0600   PLT 235 07/10/2015 0600   MCV 99.1 07/10/2015 0600   LYMPHSABS 1.1 07/05/2015 2100   MONOABS 0.9 07/05/2015 2100   EOSABS 0.2 07/05/2015 2100   BASOSABS 0.0 07/05/2015 2100    CMP     Component Value Date/Time   NA 142 07/10/2015 0600   K 4.0 07/10/2015 0600   CL 106 07/10/2015 0600   CO2 27 07/10/2015 0600   GLUCOSE 92 07/10/2015 0600   BUN 28* 07/10/2015 0600   CREATININE 1.10* 07/10/2015 0600   CALCIUM 8.7* 07/10/2015 0600   PROT 6.9 05/30/2011 0847   ALBUMIN 3.1* 05/30/2011 0847   AST 13 05/30/2011 0847   ALT 7 05/30/2011 0847   ALKPHOS 81 05/30/2011 0847   BILITOT 0.3 05/30/2011  0847   GFRNONAA 45* 07/10/2015 0600   GFRAA 52* 07/10/2015 0600    Lab Results  Component Value Date   HGBA1C 6.4* 05/30/2011     Dg Shoulder Right  07/05/2015   CLINICAL DATA:  Pt recently had a right shoulder dislocation and states she has done it again. Pt has severe right shoulder pain,  EXAM: RIGHT SHOULDER - 2+ VIEW  COMPARISON:  None.  FINDINGS: Humeral head positioned abnormally anteriorly and medially as well as inferiorly. No fracture identified. Axillary view could not be performed.  IMPRESSION: Anterior dislocation right humerus   Electronically Signed   By: Skipper Cliche M.D.   On: 07/05/2015 19:40   Mr Shoulder Right Wo Contrast  07/06/2015   CLINICAL DATA:  Severe right shoulder pain since anterior dislocations on 06/23/2015 and 07/05/2015.  EXAM: MRI OF THE RIGHT SHOULDER WITHOUT CONTRAST  TECHNIQUE: Multiplanar, multisequence MR imaging of the shoulder was performed. No intravenous contrast was administered.  COMPARISON:  Radiographs dated 07/05/2015 and 06/23/2015  FINDINGS: Bones: There is anterior dislocation of the right humeral head. There is a deep Hill-Sachs impaction fracture of the posterior lateral aspect of the humeral head. The humeral head its lodged in the anterior inferior aspect of the glenoid. There is no appreciable glenoid rim fracture.  Rotator cuff: There is a full-thickness tear of the inferior fibers of the teres minor muscle as visualized on images 21 through 25 of series 9. The supraspinous and infraspinatus and subscapularis tendons are mostly intact although there is an intrasubstance longitudinal tear in the infraspinatus best seen on images 16 through 19 of series 10. The visualization of the supraspinous tendon is suboptimal due to the distorted anatomy.  Muscles: There is extensive edema in and around the subscapularis muscle and to a lesser degree along the infraspinatus and supraspinous muscles.  Biceps long head: The long head of the biceps tendon is  dislocated lateral to the bicipital groove.  Acromioclavicular Joint:  Normal.  Glenohumeral Joint: Large glenohumeral joint effusion.  Labrum:  Not  well seen.  No discrete labral tear.  IMPRESSION: Anterior dislocation of the right humeral head. The humeral head is impacted onto the anterior rim of the glenoid with a deep Hill-Sachs lesion.  Full-thickness tear of the inferior fibers of the teres minor muscle.  Strains of all of the muscles of the rotator cuff.   Electronically Signed   By: Lorriane Shire M.D.   On: 07/06/2015 08:49   Dg Chest Port 1 View  07/05/2015   CLINICAL DATA:  81 year old female with hypoxia  EXAM: PORTABLE CHEST - 1 VIEW  COMPARISON:  Chest radiograph dated 11/19/2013  FINDINGS: Single-view of the chest demonstrate increased interstitial and vascular prominence, possibly related to a degree of congestive changes. There is no focal consolidation, pleural effusion, or pneumothorax. Stable cardiac silhouette. There is dislocation of the right shoulder.  IMPRESSION: Diffuse interstitial prominence possibly mild congestive changes.  Right shoulder dislocation.   Electronically Signed   By: Anner Crete M.D.   On: 07/05/2015 22:28    Not all labs, radiology exams or other studies done during hospitalization come through on my EPIC note; however they are reviewed by me.    Assessment and Plan  1 history of right shoulder dislocation recurrent-.  He underwent closed reduction of her shoulder on September 9 without complication-has received PT and OT this appears to have improved somewhat she will need continued PT OT as well as nursing support at home she will need a CNA to help with her ADLs as well.  History of acute respiratory failure with hypoxia secondary to low salt acute diastolic heart failure-thought secondary to IV fluid she got in the hospital-with diuresis this improved showed preserved ejection fraction and grade 2 diastolic dysfunction.  She is on  hydrochlorothiazide.  This has not been an issue during her stay here she does not complain of any increased shortness of breath or chest pain she has some mild edema we are getting venous Dopplers but this does not appear clinically to be unstable at this point.  #3-history of hypertension this appears to be stable recent blood pressures 133/82-135/76-she is on propanolol this is actually for her history of tremors as well which appears to be under decent control.  #4 history of rheumatoid arthritis she is on methotrexate-this appears to be stable and appears to be controlled.  GERD-this appears stable she is on Protonix.  Of note will order a CBC and BMP tomorrow to ensure stability.  Also will order venous Dopplers of the lower extremities secondary to the edema although again I suspect this will be negative-she is not complaining any shortness of breath chest pain or dyspnea I suspect edema may be dependent related.  [id number] note greater than 30 minutes spent on this discharge summary-greater than 50% of time spent coordinating plan of care for numerous diagnoses-prescriptions have been written   Jahniya Duzan C,

## 2015-09-06 ENCOUNTER — Ambulatory Visit: Payer: PPO | Admitting: Neurology

## 2015-10-17 ENCOUNTER — Other Ambulatory Visit: Payer: Self-pay | Admitting: Rehabilitation

## 2015-10-17 DIAGNOSIS — M5126 Other intervertebral disc displacement, lumbar region: Secondary | ICD-10-CM

## 2015-10-20 ENCOUNTER — Other Ambulatory Visit: Payer: PPO

## 2015-10-26 ENCOUNTER — Ambulatory Visit
Admission: RE | Admit: 2015-10-26 | Discharge: 2015-10-26 | Disposition: A | Discharge status: Home / Self Care | Payer: PPO | Source: Ambulatory Visit | Attending: Rehabilitation | Admitting: Rehabilitation

## 2015-10-26 DIAGNOSIS — M5126 Other intervertebral disc displacement, lumbar region: Secondary | ICD-10-CM

## 2015-10-27 ENCOUNTER — Other Ambulatory Visit: Payer: Self-pay | Admitting: Orthopedic Surgery

## 2015-10-27 DIAGNOSIS — M8008XA Age-related osteoporosis with current pathological fracture, vertebra(e), initial encounter for fracture: Secondary | ICD-10-CM

## 2015-11-01 ENCOUNTER — Ambulatory Visit
Admission: RE | Admit: 2015-11-01 | Discharge: 2015-11-01 | Disposition: A | Discharge status: Home / Self Care | Payer: PPO | Source: Ambulatory Visit | Attending: Orthopedic Surgery | Admitting: Orthopedic Surgery

## 2015-11-01 DIAGNOSIS — S32059D Unspecified fracture of fifth lumbar vertebra, subsequent encounter for fracture with routine healing: Secondary | ICD-10-CM | POA: Diagnosis not present

## 2015-11-01 DIAGNOSIS — M8008XA Age-related osteoporosis with current pathological fracture, vertebra(e), initial encounter for fracture: Secondary | ICD-10-CM

## 2015-11-01 DIAGNOSIS — S32039D Unspecified fracture of third lumbar vertebra, subsequent encounter for fracture with routine healing: Secondary | ICD-10-CM | POA: Diagnosis not present

## 2015-11-01 DIAGNOSIS — S32049D Unspecified fracture of fourth lumbar vertebra, subsequent encounter for fracture with routine healing: Secondary | ICD-10-CM | POA: Diagnosis not present

## 2015-11-02 DIAGNOSIS — M7551 Bursitis of right shoulder: Secondary | ICD-10-CM | POA: Diagnosis not present

## 2015-11-02 DIAGNOSIS — G894 Chronic pain syndrome: Secondary | ICD-10-CM | POA: Diagnosis not present

## 2015-11-02 DIAGNOSIS — M069 Rheumatoid arthritis, unspecified: Secondary | ICD-10-CM | POA: Diagnosis not present

## 2015-11-02 DIAGNOSIS — M4722 Other spondylosis with radiculopathy, cervical region: Secondary | ICD-10-CM | POA: Diagnosis not present

## 2015-11-02 DIAGNOSIS — M4317 Spondylolisthesis, lumbosacral region: Secondary | ICD-10-CM | POA: Diagnosis not present

## 2015-11-02 DIAGNOSIS — I10 Essential (primary) hypertension: Secondary | ICD-10-CM | POA: Diagnosis not present

## 2015-11-02 DIAGNOSIS — M4802 Spinal stenosis, cervical region: Secondary | ICD-10-CM | POA: Diagnosis not present

## 2015-11-02 DIAGNOSIS — M4120 Other idiopathic scoliosis, site unspecified: Secondary | ICD-10-CM | POA: Diagnosis not present

## 2015-11-02 DIAGNOSIS — F329 Major depressive disorder, single episode, unspecified: Secondary | ICD-10-CM | POA: Diagnosis not present

## 2015-11-02 DIAGNOSIS — M47817 Spondylosis without myelopathy or radiculopathy, lumbosacral region: Secondary | ICD-10-CM | POA: Diagnosis not present

## 2015-11-02 DIAGNOSIS — M5136 Other intervertebral disc degeneration, lumbar region: Secondary | ICD-10-CM | POA: Diagnosis not present

## 2015-11-02 DIAGNOSIS — Z79891 Long term (current) use of opiate analgesic: Secondary | ICD-10-CM | POA: Diagnosis not present

## 2015-11-02 DIAGNOSIS — M810 Age-related osteoporosis without current pathological fracture: Secondary | ICD-10-CM | POA: Diagnosis not present

## 2015-11-02 DIAGNOSIS — Z79899 Other long term (current) drug therapy: Secondary | ICD-10-CM | POA: Diagnosis not present

## 2015-11-04 DIAGNOSIS — M5136 Other intervertebral disc degeneration, lumbar region: Secondary | ICD-10-CM | POA: Diagnosis not present

## 2015-11-04 DIAGNOSIS — Z4689 Encounter for fitting and adjustment of other specified devices: Secondary | ICD-10-CM | POA: Diagnosis not present

## 2015-11-04 DIAGNOSIS — M4722 Other spondylosis with radiculopathy, cervical region: Secondary | ICD-10-CM | POA: Diagnosis not present

## 2015-11-04 DIAGNOSIS — M545 Low back pain: Secondary | ICD-10-CM | POA: Diagnosis not present

## 2015-11-08 DIAGNOSIS — M810 Age-related osteoporosis without current pathological fracture: Secondary | ICD-10-CM | POA: Diagnosis not present

## 2015-11-08 DIAGNOSIS — M4802 Spinal stenosis, cervical region: Secondary | ICD-10-CM | POA: Diagnosis not present

## 2015-11-08 DIAGNOSIS — Z79891 Long term (current) use of opiate analgesic: Secondary | ICD-10-CM | POA: Diagnosis not present

## 2015-11-08 DIAGNOSIS — M47817 Spondylosis without myelopathy or radiculopathy, lumbosacral region: Secondary | ICD-10-CM | POA: Diagnosis not present

## 2015-11-08 DIAGNOSIS — M4317 Spondylolisthesis, lumbosacral region: Secondary | ICD-10-CM | POA: Diagnosis not present

## 2015-11-08 DIAGNOSIS — I10 Essential (primary) hypertension: Secondary | ICD-10-CM | POA: Diagnosis not present

## 2015-11-08 DIAGNOSIS — M4722 Other spondylosis with radiculopathy, cervical region: Secondary | ICD-10-CM | POA: Diagnosis not present

## 2015-11-08 DIAGNOSIS — M4120 Other idiopathic scoliosis, site unspecified: Secondary | ICD-10-CM | POA: Diagnosis not present

## 2015-11-08 DIAGNOSIS — M069 Rheumatoid arthritis, unspecified: Secondary | ICD-10-CM | POA: Diagnosis not present

## 2015-11-08 DIAGNOSIS — M7551 Bursitis of right shoulder: Secondary | ICD-10-CM | POA: Diagnosis not present

## 2015-11-08 DIAGNOSIS — M5136 Other intervertebral disc degeneration, lumbar region: Secondary | ICD-10-CM | POA: Diagnosis not present

## 2015-11-08 DIAGNOSIS — G894 Chronic pain syndrome: Secondary | ICD-10-CM | POA: Diagnosis not present

## 2015-11-08 DIAGNOSIS — F329 Major depressive disorder, single episode, unspecified: Secondary | ICD-10-CM | POA: Diagnosis not present

## 2015-11-08 DIAGNOSIS — Z79899 Other long term (current) drug therapy: Secondary | ICD-10-CM | POA: Diagnosis not present

## 2015-11-10 DIAGNOSIS — M47812 Spondylosis without myelopathy or radiculopathy, cervical region: Secondary | ICD-10-CM | POA: Diagnosis not present

## 2015-11-10 DIAGNOSIS — S32049A Unspecified fracture of fourth lumbar vertebra, initial encounter for closed fracture: Secondary | ICD-10-CM | POA: Diagnosis not present

## 2015-11-10 DIAGNOSIS — M47817 Spondylosis without myelopathy or radiculopathy, lumbosacral region: Secondary | ICD-10-CM | POA: Diagnosis not present

## 2015-11-10 DIAGNOSIS — S32039A Unspecified fracture of third lumbar vertebra, initial encounter for closed fracture: Secondary | ICD-10-CM | POA: Diagnosis not present

## 2015-11-11 DIAGNOSIS — M5136 Other intervertebral disc degeneration, lumbar region: Secondary | ICD-10-CM | POA: Diagnosis not present

## 2015-11-11 DIAGNOSIS — M4120 Other idiopathic scoliosis, site unspecified: Secondary | ICD-10-CM | POA: Diagnosis not present

## 2015-11-11 DIAGNOSIS — M4722 Other spondylosis with radiculopathy, cervical region: Secondary | ICD-10-CM | POA: Diagnosis not present

## 2015-11-11 DIAGNOSIS — Z79891 Long term (current) use of opiate analgesic: Secondary | ICD-10-CM | POA: Diagnosis not present

## 2015-11-11 DIAGNOSIS — M810 Age-related osteoporosis without current pathological fracture: Secondary | ICD-10-CM | POA: Diagnosis not present

## 2015-11-11 DIAGNOSIS — G894 Chronic pain syndrome: Secondary | ICD-10-CM | POA: Diagnosis not present

## 2015-11-11 DIAGNOSIS — M069 Rheumatoid arthritis, unspecified: Secondary | ICD-10-CM | POA: Diagnosis not present

## 2015-11-11 DIAGNOSIS — I10 Essential (primary) hypertension: Secondary | ICD-10-CM | POA: Diagnosis not present

## 2015-11-11 DIAGNOSIS — M4317 Spondylolisthesis, lumbosacral region: Secondary | ICD-10-CM | POA: Diagnosis not present

## 2015-11-11 DIAGNOSIS — M7551 Bursitis of right shoulder: Secondary | ICD-10-CM | POA: Diagnosis not present

## 2015-11-11 DIAGNOSIS — F329 Major depressive disorder, single episode, unspecified: Secondary | ICD-10-CM | POA: Diagnosis not present

## 2015-11-11 DIAGNOSIS — M4802 Spinal stenosis, cervical region: Secondary | ICD-10-CM | POA: Diagnosis not present

## 2015-11-11 DIAGNOSIS — M47817 Spondylosis without myelopathy or radiculopathy, lumbosacral region: Secondary | ICD-10-CM | POA: Diagnosis not present

## 2015-11-11 DIAGNOSIS — Z79899 Other long term (current) drug therapy: Secondary | ICD-10-CM | POA: Diagnosis not present

## 2015-11-14 DIAGNOSIS — G894 Chronic pain syndrome: Secondary | ICD-10-CM | POA: Diagnosis not present

## 2015-11-14 DIAGNOSIS — F329 Major depressive disorder, single episode, unspecified: Secondary | ICD-10-CM | POA: Diagnosis not present

## 2015-11-14 DIAGNOSIS — M4120 Other idiopathic scoliosis, site unspecified: Secondary | ICD-10-CM | POA: Diagnosis not present

## 2015-11-14 DIAGNOSIS — M47817 Spondylosis without myelopathy or radiculopathy, lumbosacral region: Secondary | ICD-10-CM | POA: Diagnosis not present

## 2015-11-14 DIAGNOSIS — M7551 Bursitis of right shoulder: Secondary | ICD-10-CM | POA: Diagnosis not present

## 2015-11-14 DIAGNOSIS — I10 Essential (primary) hypertension: Secondary | ICD-10-CM | POA: Diagnosis not present

## 2015-11-14 DIAGNOSIS — Z79891 Long term (current) use of opiate analgesic: Secondary | ICD-10-CM | POA: Diagnosis not present

## 2015-11-14 DIAGNOSIS — M069 Rheumatoid arthritis, unspecified: Secondary | ICD-10-CM | POA: Diagnosis not present

## 2015-11-14 DIAGNOSIS — M810 Age-related osteoporosis without current pathological fracture: Secondary | ICD-10-CM | POA: Diagnosis not present

## 2015-11-14 DIAGNOSIS — M4317 Spondylolisthesis, lumbosacral region: Secondary | ICD-10-CM | POA: Diagnosis not present

## 2015-11-14 DIAGNOSIS — M5136 Other intervertebral disc degeneration, lumbar region: Secondary | ICD-10-CM | POA: Diagnosis not present

## 2015-11-14 DIAGNOSIS — Z79899 Other long term (current) drug therapy: Secondary | ICD-10-CM | POA: Diagnosis not present

## 2015-11-14 DIAGNOSIS — M4722 Other spondylosis with radiculopathy, cervical region: Secondary | ICD-10-CM | POA: Diagnosis not present

## 2015-11-14 DIAGNOSIS — M4802 Spinal stenosis, cervical region: Secondary | ICD-10-CM | POA: Diagnosis not present

## 2015-11-15 DIAGNOSIS — M4722 Other spondylosis with radiculopathy, cervical region: Secondary | ICD-10-CM | POA: Diagnosis not present

## 2015-11-15 DIAGNOSIS — M5136 Other intervertebral disc degeneration, lumbar region: Secondary | ICD-10-CM | POA: Diagnosis not present

## 2015-11-15 DIAGNOSIS — M4317 Spondylolisthesis, lumbosacral region: Secondary | ICD-10-CM | POA: Diagnosis not present

## 2015-11-15 DIAGNOSIS — M47817 Spondylosis without myelopathy or radiculopathy, lumbosacral region: Secondary | ICD-10-CM | POA: Diagnosis not present

## 2015-11-22 DIAGNOSIS — I10 Essential (primary) hypertension: Secondary | ICD-10-CM | POA: Diagnosis not present

## 2015-11-22 DIAGNOSIS — M25519 Pain in unspecified shoulder: Secondary | ICD-10-CM | POA: Diagnosis not present

## 2015-11-22 DIAGNOSIS — D649 Anemia, unspecified: Secondary | ICD-10-CM | POA: Diagnosis not present

## 2015-11-22 DIAGNOSIS — Z1389 Encounter for screening for other disorder: Secondary | ICD-10-CM | POA: Diagnosis not present

## 2015-11-22 DIAGNOSIS — G8929 Other chronic pain: Secondary | ICD-10-CM | POA: Diagnosis not present

## 2015-11-24 DIAGNOSIS — M47817 Spondylosis without myelopathy or radiculopathy, lumbosacral region: Secondary | ICD-10-CM | POA: Diagnosis not present

## 2015-11-24 DIAGNOSIS — M5136 Other intervertebral disc degeneration, lumbar region: Secondary | ICD-10-CM | POA: Diagnosis not present

## 2015-11-24 DIAGNOSIS — F329 Major depressive disorder, single episode, unspecified: Secondary | ICD-10-CM | POA: Diagnosis not present

## 2015-11-24 DIAGNOSIS — M4317 Spondylolisthesis, lumbosacral region: Secondary | ICD-10-CM | POA: Diagnosis not present

## 2015-11-24 DIAGNOSIS — M069 Rheumatoid arthritis, unspecified: Secondary | ICD-10-CM | POA: Diagnosis not present

## 2015-11-24 DIAGNOSIS — M810 Age-related osteoporosis without current pathological fracture: Secondary | ICD-10-CM | POA: Diagnosis not present

## 2015-11-24 DIAGNOSIS — M4120 Other idiopathic scoliosis, site unspecified: Secondary | ICD-10-CM | POA: Diagnosis not present

## 2015-11-24 DIAGNOSIS — Z79891 Long term (current) use of opiate analgesic: Secondary | ICD-10-CM | POA: Diagnosis not present

## 2015-11-24 DIAGNOSIS — I10 Essential (primary) hypertension: Secondary | ICD-10-CM | POA: Diagnosis not present

## 2015-11-24 DIAGNOSIS — M4802 Spinal stenosis, cervical region: Secondary | ICD-10-CM | POA: Diagnosis not present

## 2015-11-24 DIAGNOSIS — M4722 Other spondylosis with radiculopathy, cervical region: Secondary | ICD-10-CM | POA: Diagnosis not present

## 2015-11-24 DIAGNOSIS — Z79899 Other long term (current) drug therapy: Secondary | ICD-10-CM | POA: Diagnosis not present

## 2015-11-24 DIAGNOSIS — M7551 Bursitis of right shoulder: Secondary | ICD-10-CM | POA: Diagnosis not present

## 2015-11-24 DIAGNOSIS — G894 Chronic pain syndrome: Secondary | ICD-10-CM | POA: Diagnosis not present

## 2015-11-28 DIAGNOSIS — M4802 Spinal stenosis, cervical region: Secondary | ICD-10-CM | POA: Diagnosis not present

## 2015-11-28 DIAGNOSIS — M47817 Spondylosis without myelopathy or radiculopathy, lumbosacral region: Secondary | ICD-10-CM | POA: Diagnosis not present

## 2015-11-28 DIAGNOSIS — G894 Chronic pain syndrome: Secondary | ICD-10-CM | POA: Diagnosis not present

## 2015-11-28 DIAGNOSIS — M7551 Bursitis of right shoulder: Secondary | ICD-10-CM | POA: Diagnosis not present

## 2015-11-28 DIAGNOSIS — M069 Rheumatoid arthritis, unspecified: Secondary | ICD-10-CM | POA: Diagnosis not present

## 2015-11-28 DIAGNOSIS — M4317 Spondylolisthesis, lumbosacral region: Secondary | ICD-10-CM | POA: Diagnosis not present

## 2015-11-28 DIAGNOSIS — M810 Age-related osteoporosis without current pathological fracture: Secondary | ICD-10-CM | POA: Diagnosis not present

## 2015-11-28 DIAGNOSIS — I10 Essential (primary) hypertension: Secondary | ICD-10-CM | POA: Diagnosis not present

## 2015-11-28 DIAGNOSIS — M5136 Other intervertebral disc degeneration, lumbar region: Secondary | ICD-10-CM | POA: Diagnosis not present

## 2015-11-28 DIAGNOSIS — M4120 Other idiopathic scoliosis, site unspecified: Secondary | ICD-10-CM | POA: Diagnosis not present

## 2015-11-28 DIAGNOSIS — M4722 Other spondylosis with radiculopathy, cervical region: Secondary | ICD-10-CM | POA: Diagnosis not present

## 2015-11-28 DIAGNOSIS — F329 Major depressive disorder, single episode, unspecified: Secondary | ICD-10-CM | POA: Diagnosis not present

## 2015-11-28 DIAGNOSIS — Z79899 Other long term (current) drug therapy: Secondary | ICD-10-CM | POA: Diagnosis not present

## 2015-11-30 DIAGNOSIS — N8111 Cystocele, midline: Secondary | ICD-10-CM | POA: Diagnosis not present

## 2015-11-30 DIAGNOSIS — N949 Unspecified condition associated with female genital organs and menstrual cycle: Secondary | ICD-10-CM | POA: Diagnosis not present

## 2015-11-30 DIAGNOSIS — N819 Female genital prolapse, unspecified: Secondary | ICD-10-CM | POA: Diagnosis not present

## 2015-11-30 DIAGNOSIS — Z4689 Encounter for fitting and adjustment of other specified devices: Secondary | ICD-10-CM | POA: Diagnosis not present

## 2015-12-01 DIAGNOSIS — M4316 Spondylolisthesis, lumbar region: Secondary | ICD-10-CM | POA: Diagnosis not present

## 2015-12-05 DIAGNOSIS — M150 Primary generalized (osteo)arthritis: Secondary | ICD-10-CM | POA: Diagnosis not present

## 2015-12-05 DIAGNOSIS — Z79899 Other long term (current) drug therapy: Secondary | ICD-10-CM | POA: Diagnosis not present

## 2015-12-05 DIAGNOSIS — M0609 Rheumatoid arthritis without rheumatoid factor, multiple sites: Secondary | ICD-10-CM | POA: Diagnosis not present

## 2015-12-05 DIAGNOSIS — T148 Other injury of unspecified body region: Secondary | ICD-10-CM | POA: Diagnosis not present

## 2015-12-05 DIAGNOSIS — M14671 Charcot's joint, right ankle and foot: Secondary | ICD-10-CM | POA: Diagnosis not present

## 2015-12-05 DIAGNOSIS — S43004S Unspecified dislocation of right shoulder joint, sequela: Secondary | ICD-10-CM | POA: Diagnosis not present

## 2015-12-06 DIAGNOSIS — G894 Chronic pain syndrome: Secondary | ICD-10-CM | POA: Diagnosis not present

## 2015-12-06 DIAGNOSIS — M47817 Spondylosis without myelopathy or radiculopathy, lumbosacral region: Secondary | ICD-10-CM | POA: Diagnosis not present

## 2015-12-06 DIAGNOSIS — M069 Rheumatoid arthritis, unspecified: Secondary | ICD-10-CM | POA: Diagnosis not present

## 2015-12-06 DIAGNOSIS — F329 Major depressive disorder, single episode, unspecified: Secondary | ICD-10-CM | POA: Diagnosis not present

## 2015-12-06 DIAGNOSIS — M4802 Spinal stenosis, cervical region: Secondary | ICD-10-CM | POA: Diagnosis not present

## 2015-12-06 DIAGNOSIS — M5136 Other intervertebral disc degeneration, lumbar region: Secondary | ICD-10-CM | POA: Diagnosis not present

## 2015-12-06 DIAGNOSIS — M7551 Bursitis of right shoulder: Secondary | ICD-10-CM | POA: Diagnosis not present

## 2015-12-06 DIAGNOSIS — M4317 Spondylolisthesis, lumbosacral region: Secondary | ICD-10-CM | POA: Diagnosis not present

## 2015-12-06 DIAGNOSIS — Z79899 Other long term (current) drug therapy: Secondary | ICD-10-CM | POA: Diagnosis not present

## 2015-12-06 DIAGNOSIS — M4722 Other spondylosis with radiculopathy, cervical region: Secondary | ICD-10-CM | POA: Diagnosis not present

## 2015-12-06 DIAGNOSIS — I10 Essential (primary) hypertension: Secondary | ICD-10-CM | POA: Diagnosis not present

## 2015-12-06 DIAGNOSIS — M4120 Other idiopathic scoliosis, site unspecified: Secondary | ICD-10-CM | POA: Diagnosis not present

## 2015-12-06 DIAGNOSIS — M810 Age-related osteoporosis without current pathological fracture: Secondary | ICD-10-CM | POA: Diagnosis not present

## 2015-12-08 ENCOUNTER — Other Ambulatory Visit (HOSPITAL_BASED_OUTPATIENT_CLINIC_OR_DEPARTMENT_OTHER): Payer: Self-pay | Admitting: Internal Medicine

## 2015-12-08 DIAGNOSIS — Z780 Asymptomatic menopausal state: Secondary | ICD-10-CM

## 2015-12-16 ENCOUNTER — Ambulatory Visit (HOSPITAL_BASED_OUTPATIENT_CLINIC_OR_DEPARTMENT_OTHER)
Admission: RE | Admit: 2015-12-16 | Discharge: 2015-12-16 | Disposition: A | Discharge status: Home / Self Care | Payer: PPO | Source: Ambulatory Visit | Attending: Internal Medicine | Admitting: Internal Medicine

## 2015-12-16 DIAGNOSIS — I10 Essential (primary) hypertension: Secondary | ICD-10-CM | POA: Diagnosis not present

## 2015-12-16 DIAGNOSIS — M8580 Other specified disorders of bone density and structure, unspecified site: Secondary | ICD-10-CM | POA: Insufficient documentation

## 2015-12-16 DIAGNOSIS — Z780 Asymptomatic menopausal state: Secondary | ICD-10-CM | POA: Diagnosis not present

## 2015-12-16 DIAGNOSIS — M069 Rheumatoid arthritis, unspecified: Secondary | ICD-10-CM | POA: Diagnosis not present

## 2015-12-16 DIAGNOSIS — N83201 Unspecified ovarian cyst, right side: Secondary | ICD-10-CM | POA: Diagnosis not present

## 2015-12-16 DIAGNOSIS — M5136 Other intervertebral disc degeneration, lumbar region: Secondary | ICD-10-CM | POA: Diagnosis not present

## 2015-12-16 DIAGNOSIS — M4317 Spondylolisthesis, lumbosacral region: Secondary | ICD-10-CM | POA: Diagnosis not present

## 2015-12-16 DIAGNOSIS — Z79899 Other long term (current) drug therapy: Secondary | ICD-10-CM | POA: Diagnosis not present

## 2015-12-16 DIAGNOSIS — M8589 Other specified disorders of bone density and structure, multiple sites: Secondary | ICD-10-CM | POA: Diagnosis not present

## 2015-12-16 DIAGNOSIS — D3911 Neoplasm of uncertain behavior of right ovary: Secondary | ICD-10-CM | POA: Diagnosis not present

## 2015-12-16 DIAGNOSIS — M4120 Other idiopathic scoliosis, site unspecified: Secondary | ICD-10-CM | POA: Diagnosis not present

## 2015-12-16 DIAGNOSIS — M4722 Other spondylosis with radiculopathy, cervical region: Secondary | ICD-10-CM | POA: Diagnosis not present

## 2015-12-16 DIAGNOSIS — Z90710 Acquired absence of both cervix and uterus: Secondary | ICD-10-CM | POA: Insufficient documentation

## 2015-12-16 DIAGNOSIS — M7551 Bursitis of right shoulder: Secondary | ICD-10-CM | POA: Diagnosis not present

## 2015-12-16 DIAGNOSIS — M4802 Spinal stenosis, cervical region: Secondary | ICD-10-CM | POA: Diagnosis not present

## 2015-12-16 DIAGNOSIS — M810 Age-related osteoporosis without current pathological fracture: Secondary | ICD-10-CM | POA: Diagnosis not present

## 2015-12-16 DIAGNOSIS — M47817 Spondylosis without myelopathy or radiculopathy, lumbosacral region: Secondary | ICD-10-CM | POA: Diagnosis not present

## 2015-12-16 DIAGNOSIS — F329 Major depressive disorder, single episode, unspecified: Secondary | ICD-10-CM | POA: Diagnosis not present

## 2015-12-16 DIAGNOSIS — G894 Chronic pain syndrome: Secondary | ICD-10-CM | POA: Diagnosis not present

## 2015-12-20 DIAGNOSIS — M7551 Bursitis of right shoulder: Secondary | ICD-10-CM | POA: Diagnosis not present

## 2015-12-20 DIAGNOSIS — M4120 Other idiopathic scoliosis, site unspecified: Secondary | ICD-10-CM | POA: Diagnosis not present

## 2015-12-20 DIAGNOSIS — M4802 Spinal stenosis, cervical region: Secondary | ICD-10-CM | POA: Diagnosis not present

## 2015-12-20 DIAGNOSIS — M4317 Spondylolisthesis, lumbosacral region: Secondary | ICD-10-CM | POA: Diagnosis not present

## 2015-12-20 DIAGNOSIS — I10 Essential (primary) hypertension: Secondary | ICD-10-CM | POA: Diagnosis not present

## 2015-12-20 DIAGNOSIS — M5136 Other intervertebral disc degeneration, lumbar region: Secondary | ICD-10-CM | POA: Diagnosis not present

## 2015-12-20 DIAGNOSIS — G894 Chronic pain syndrome: Secondary | ICD-10-CM | POA: Diagnosis not present

## 2015-12-20 DIAGNOSIS — M4722 Other spondylosis with radiculopathy, cervical region: Secondary | ICD-10-CM | POA: Diagnosis not present

## 2015-12-20 DIAGNOSIS — M810 Age-related osteoporosis without current pathological fracture: Secondary | ICD-10-CM | POA: Diagnosis not present

## 2015-12-20 DIAGNOSIS — M069 Rheumatoid arthritis, unspecified: Secondary | ICD-10-CM | POA: Diagnosis not present

## 2015-12-20 DIAGNOSIS — Z79899 Other long term (current) drug therapy: Secondary | ICD-10-CM | POA: Diagnosis not present

## 2015-12-20 DIAGNOSIS — F329 Major depressive disorder, single episode, unspecified: Secondary | ICD-10-CM | POA: Diagnosis not present

## 2015-12-20 DIAGNOSIS — M47817 Spondylosis without myelopathy or radiculopathy, lumbosacral region: Secondary | ICD-10-CM | POA: Diagnosis not present

## 2015-12-27 DIAGNOSIS — M7551 Bursitis of right shoulder: Secondary | ICD-10-CM | POA: Diagnosis not present

## 2015-12-27 DIAGNOSIS — M4120 Other idiopathic scoliosis, site unspecified: Secondary | ICD-10-CM | POA: Diagnosis not present

## 2015-12-27 DIAGNOSIS — M47817 Spondylosis without myelopathy or radiculopathy, lumbosacral region: Secondary | ICD-10-CM | POA: Diagnosis not present

## 2015-12-27 DIAGNOSIS — M4802 Spinal stenosis, cervical region: Secondary | ICD-10-CM | POA: Diagnosis not present

## 2015-12-27 DIAGNOSIS — G894 Chronic pain syndrome: Secondary | ICD-10-CM | POA: Diagnosis not present

## 2015-12-27 DIAGNOSIS — M4722 Other spondylosis with radiculopathy, cervical region: Secondary | ICD-10-CM | POA: Diagnosis not present

## 2015-12-27 DIAGNOSIS — M5136 Other intervertebral disc degeneration, lumbar region: Secondary | ICD-10-CM | POA: Diagnosis not present

## 2015-12-27 DIAGNOSIS — I10 Essential (primary) hypertension: Secondary | ICD-10-CM | POA: Diagnosis not present

## 2015-12-27 DIAGNOSIS — M4317 Spondylolisthesis, lumbosacral region: Secondary | ICD-10-CM | POA: Diagnosis not present

## 2015-12-27 DIAGNOSIS — M810 Age-related osteoporosis without current pathological fracture: Secondary | ICD-10-CM | POA: Diagnosis not present

## 2015-12-27 DIAGNOSIS — M069 Rheumatoid arthritis, unspecified: Secondary | ICD-10-CM | POA: Diagnosis not present

## 2015-12-27 DIAGNOSIS — Z79899 Other long term (current) drug therapy: Secondary | ICD-10-CM | POA: Diagnosis not present

## 2015-12-27 DIAGNOSIS — F329 Major depressive disorder, single episode, unspecified: Secondary | ICD-10-CM | POA: Diagnosis not present

## 2015-12-30 DIAGNOSIS — M4802 Spinal stenosis, cervical region: Secondary | ICD-10-CM | POA: Diagnosis not present

## 2015-12-30 DIAGNOSIS — I10 Essential (primary) hypertension: Secondary | ICD-10-CM | POA: Diagnosis not present

## 2015-12-30 DIAGNOSIS — M47817 Spondylosis without myelopathy or radiculopathy, lumbosacral region: Secondary | ICD-10-CM | POA: Diagnosis not present

## 2015-12-30 DIAGNOSIS — M4317 Spondylolisthesis, lumbosacral region: Secondary | ICD-10-CM | POA: Diagnosis not present

## 2015-12-30 DIAGNOSIS — F329 Major depressive disorder, single episode, unspecified: Secondary | ICD-10-CM | POA: Diagnosis not present

## 2015-12-30 DIAGNOSIS — G894 Chronic pain syndrome: Secondary | ICD-10-CM | POA: Diagnosis not present

## 2015-12-30 DIAGNOSIS — M4120 Other idiopathic scoliosis, site unspecified: Secondary | ICD-10-CM | POA: Diagnosis not present

## 2015-12-30 DIAGNOSIS — M810 Age-related osteoporosis without current pathological fracture: Secondary | ICD-10-CM | POA: Diagnosis not present

## 2015-12-30 DIAGNOSIS — M7551 Bursitis of right shoulder: Secondary | ICD-10-CM | POA: Diagnosis not present

## 2015-12-30 DIAGNOSIS — M4722 Other spondylosis with radiculopathy, cervical region: Secondary | ICD-10-CM | POA: Diagnosis not present

## 2015-12-30 DIAGNOSIS — Z79899 Other long term (current) drug therapy: Secondary | ICD-10-CM | POA: Diagnosis not present

## 2015-12-30 DIAGNOSIS — M5136 Other intervertebral disc degeneration, lumbar region: Secondary | ICD-10-CM | POA: Diagnosis not present

## 2015-12-30 DIAGNOSIS — M069 Rheumatoid arthritis, unspecified: Secondary | ICD-10-CM | POA: Diagnosis not present

## 2016-01-03 DIAGNOSIS — M0609 Rheumatoid arthritis without rheumatoid factor, multiple sites: Secondary | ICD-10-CM | POA: Diagnosis not present

## 2016-01-03 DIAGNOSIS — Z79899 Other long term (current) drug therapy: Secondary | ICD-10-CM | POA: Diagnosis not present

## 2016-01-13 DIAGNOSIS — M47892 Other spondylosis, cervical region: Secondary | ICD-10-CM | POA: Diagnosis not present

## 2016-01-13 DIAGNOSIS — M542 Cervicalgia: Secondary | ICD-10-CM | POA: Diagnosis not present

## 2016-01-13 DIAGNOSIS — S32039S Unspecified fracture of third lumbar vertebra, sequela: Secondary | ICD-10-CM | POA: Diagnosis not present

## 2016-01-18 DIAGNOSIS — M47816 Spondylosis without myelopathy or radiculopathy, lumbar region: Secondary | ICD-10-CM | POA: Diagnosis not present

## 2016-01-18 DIAGNOSIS — M810 Age-related osteoporosis without current pathological fracture: Secondary | ICD-10-CM | POA: Diagnosis not present

## 2016-01-18 DIAGNOSIS — Z79891 Long term (current) use of opiate analgesic: Secondary | ICD-10-CM | POA: Diagnosis not present

## 2016-01-18 DIAGNOSIS — M7551 Bursitis of right shoulder: Secondary | ICD-10-CM | POA: Diagnosis not present

## 2016-01-18 DIAGNOSIS — M519 Unspecified thoracic, thoracolumbar and lumbosacral intervertebral disc disorder: Secondary | ICD-10-CM | POA: Diagnosis not present

## 2016-01-18 DIAGNOSIS — N183 Chronic kidney disease, stage 3 (moderate): Secondary | ICD-10-CM | POA: Diagnosis not present

## 2016-01-18 DIAGNOSIS — M4310 Spondylolisthesis, site unspecified: Secondary | ICD-10-CM | POA: Diagnosis not present

## 2016-01-18 DIAGNOSIS — M4800 Spinal stenosis, site unspecified: Secondary | ICD-10-CM | POA: Diagnosis not present

## 2016-01-18 DIAGNOSIS — M4312 Spondylolisthesis, cervical region: Secondary | ICD-10-CM | POA: Diagnosis not present

## 2016-01-18 DIAGNOSIS — Z79899 Other long term (current) drug therapy: Secondary | ICD-10-CM | POA: Diagnosis not present

## 2016-01-18 DIAGNOSIS — Z9181 History of falling: Secondary | ICD-10-CM | POA: Diagnosis not present

## 2016-01-18 DIAGNOSIS — M069 Rheumatoid arthritis, unspecified: Secondary | ICD-10-CM | POA: Diagnosis not present

## 2016-01-18 DIAGNOSIS — M5136 Other intervertebral disc degeneration, lumbar region: Secondary | ICD-10-CM | POA: Diagnosis not present

## 2016-01-18 DIAGNOSIS — I10 Essential (primary) hypertension: Secondary | ICD-10-CM | POA: Diagnosis not present

## 2016-01-24 DIAGNOSIS — Z9181 History of falling: Secondary | ICD-10-CM | POA: Diagnosis not present

## 2016-01-24 DIAGNOSIS — I10 Essential (primary) hypertension: Secondary | ICD-10-CM | POA: Diagnosis not present

## 2016-01-24 DIAGNOSIS — M47816 Spondylosis without myelopathy or radiculopathy, lumbar region: Secondary | ICD-10-CM | POA: Diagnosis not present

## 2016-01-24 DIAGNOSIS — Z79891 Long term (current) use of opiate analgesic: Secondary | ICD-10-CM | POA: Diagnosis not present

## 2016-01-24 DIAGNOSIS — Z79899 Other long term (current) drug therapy: Secondary | ICD-10-CM | POA: Diagnosis not present

## 2016-01-24 DIAGNOSIS — M4800 Spinal stenosis, site unspecified: Secondary | ICD-10-CM | POA: Diagnosis not present

## 2016-01-24 DIAGNOSIS — M4310 Spondylolisthesis, site unspecified: Secondary | ICD-10-CM | POA: Diagnosis not present

## 2016-01-24 DIAGNOSIS — M4312 Spondylolisthesis, cervical region: Secondary | ICD-10-CM | POA: Diagnosis not present

## 2016-01-24 DIAGNOSIS — M5136 Other intervertebral disc degeneration, lumbar region: Secondary | ICD-10-CM | POA: Diagnosis not present

## 2016-01-24 DIAGNOSIS — M810 Age-related osteoporosis without current pathological fracture: Secondary | ICD-10-CM | POA: Diagnosis not present

## 2016-01-24 DIAGNOSIS — M7551 Bursitis of right shoulder: Secondary | ICD-10-CM | POA: Diagnosis not present

## 2016-02-02 DIAGNOSIS — I10 Essential (primary) hypertension: Secondary | ICD-10-CM | POA: Diagnosis not present

## 2016-02-02 DIAGNOSIS — Z79891 Long term (current) use of opiate analgesic: Secondary | ICD-10-CM | POA: Diagnosis not present

## 2016-02-02 DIAGNOSIS — M4312 Spondylolisthesis, cervical region: Secondary | ICD-10-CM | POA: Diagnosis not present

## 2016-02-02 DIAGNOSIS — M4800 Spinal stenosis, site unspecified: Secondary | ICD-10-CM | POA: Diagnosis not present

## 2016-02-02 DIAGNOSIS — Z9181 History of falling: Secondary | ICD-10-CM | POA: Diagnosis not present

## 2016-02-02 DIAGNOSIS — M47816 Spondylosis without myelopathy or radiculopathy, lumbar region: Secondary | ICD-10-CM | POA: Diagnosis not present

## 2016-02-02 DIAGNOSIS — M5136 Other intervertebral disc degeneration, lumbar region: Secondary | ICD-10-CM | POA: Diagnosis not present

## 2016-02-02 DIAGNOSIS — M810 Age-related osteoporosis without current pathological fracture: Secondary | ICD-10-CM | POA: Diagnosis not present

## 2016-02-02 DIAGNOSIS — M7551 Bursitis of right shoulder: Secondary | ICD-10-CM | POA: Diagnosis not present

## 2016-02-02 DIAGNOSIS — M4310 Spondylolisthesis, site unspecified: Secondary | ICD-10-CM | POA: Diagnosis not present

## 2016-02-02 DIAGNOSIS — Z79899 Other long term (current) drug therapy: Secondary | ICD-10-CM | POA: Diagnosis not present

## 2016-02-06 DIAGNOSIS — M4312 Spondylolisthesis, cervical region: Secondary | ICD-10-CM | POA: Diagnosis not present

## 2016-02-06 DIAGNOSIS — M810 Age-related osteoporosis without current pathological fracture: Secondary | ICD-10-CM | POA: Diagnosis not present

## 2016-02-06 DIAGNOSIS — M47816 Spondylosis without myelopathy or radiculopathy, lumbar region: Secondary | ICD-10-CM | POA: Diagnosis not present

## 2016-02-06 DIAGNOSIS — I10 Essential (primary) hypertension: Secondary | ICD-10-CM | POA: Diagnosis not present

## 2016-02-06 DIAGNOSIS — Z79899 Other long term (current) drug therapy: Secondary | ICD-10-CM | POA: Diagnosis not present

## 2016-02-06 DIAGNOSIS — M5136 Other intervertebral disc degeneration, lumbar region: Secondary | ICD-10-CM | POA: Diagnosis not present

## 2016-02-06 DIAGNOSIS — Z79891 Long term (current) use of opiate analgesic: Secondary | ICD-10-CM | POA: Diagnosis not present

## 2016-02-06 DIAGNOSIS — M4800 Spinal stenosis, site unspecified: Secondary | ICD-10-CM | POA: Diagnosis not present

## 2016-02-06 DIAGNOSIS — Z9181 History of falling: Secondary | ICD-10-CM | POA: Diagnosis not present

## 2016-02-06 DIAGNOSIS — M4310 Spondylolisthesis, site unspecified: Secondary | ICD-10-CM | POA: Diagnosis not present

## 2016-02-06 DIAGNOSIS — M7551 Bursitis of right shoulder: Secondary | ICD-10-CM | POA: Diagnosis not present

## 2016-02-14 DIAGNOSIS — M4800 Spinal stenosis, site unspecified: Secondary | ICD-10-CM | POA: Diagnosis not present

## 2016-02-14 DIAGNOSIS — I10 Essential (primary) hypertension: Secondary | ICD-10-CM | POA: Diagnosis not present

## 2016-02-14 DIAGNOSIS — Z79899 Other long term (current) drug therapy: Secondary | ICD-10-CM | POA: Diagnosis not present

## 2016-02-14 DIAGNOSIS — Z9181 History of falling: Secondary | ICD-10-CM | POA: Diagnosis not present

## 2016-02-14 DIAGNOSIS — M47816 Spondylosis without myelopathy or radiculopathy, lumbar region: Secondary | ICD-10-CM | POA: Diagnosis not present

## 2016-02-14 DIAGNOSIS — M7551 Bursitis of right shoulder: Secondary | ICD-10-CM | POA: Diagnosis not present

## 2016-02-14 DIAGNOSIS — M810 Age-related osteoporosis without current pathological fracture: Secondary | ICD-10-CM | POA: Diagnosis not present

## 2016-02-14 DIAGNOSIS — M4312 Spondylolisthesis, cervical region: Secondary | ICD-10-CM | POA: Diagnosis not present

## 2016-02-14 DIAGNOSIS — M4310 Spondylolisthesis, site unspecified: Secondary | ICD-10-CM | POA: Diagnosis not present

## 2016-02-14 DIAGNOSIS — M5136 Other intervertebral disc degeneration, lumbar region: Secondary | ICD-10-CM | POA: Diagnosis not present

## 2016-02-14 DIAGNOSIS — Z79891 Long term (current) use of opiate analgesic: Secondary | ICD-10-CM | POA: Diagnosis not present

## 2016-02-20 DIAGNOSIS — Z79891 Long term (current) use of opiate analgesic: Secondary | ICD-10-CM | POA: Diagnosis not present

## 2016-02-20 DIAGNOSIS — Z79899 Other long term (current) drug therapy: Secondary | ICD-10-CM | POA: Diagnosis not present

## 2016-02-20 DIAGNOSIS — M5136 Other intervertebral disc degeneration, lumbar region: Secondary | ICD-10-CM | POA: Diagnosis not present

## 2016-02-20 DIAGNOSIS — M4310 Spondylolisthesis, site unspecified: Secondary | ICD-10-CM | POA: Diagnosis not present

## 2016-02-20 DIAGNOSIS — Z9181 History of falling: Secondary | ICD-10-CM | POA: Diagnosis not present

## 2016-02-20 DIAGNOSIS — M4312 Spondylolisthesis, cervical region: Secondary | ICD-10-CM | POA: Diagnosis not present

## 2016-02-20 DIAGNOSIS — M4800 Spinal stenosis, site unspecified: Secondary | ICD-10-CM | POA: Diagnosis not present

## 2016-02-20 DIAGNOSIS — M47816 Spondylosis without myelopathy or radiculopathy, lumbar region: Secondary | ICD-10-CM | POA: Diagnosis not present

## 2016-02-20 DIAGNOSIS — M7551 Bursitis of right shoulder: Secondary | ICD-10-CM | POA: Diagnosis not present

## 2016-02-20 DIAGNOSIS — I10 Essential (primary) hypertension: Secondary | ICD-10-CM | POA: Diagnosis not present

## 2016-02-20 DIAGNOSIS — M810 Age-related osteoporosis without current pathological fracture: Secondary | ICD-10-CM | POA: Diagnosis not present

## 2016-02-27 DIAGNOSIS — M4310 Spondylolisthesis, site unspecified: Secondary | ICD-10-CM | POA: Diagnosis not present

## 2016-02-27 DIAGNOSIS — M5136 Other intervertebral disc degeneration, lumbar region: Secondary | ICD-10-CM | POA: Diagnosis not present

## 2016-02-27 DIAGNOSIS — M4800 Spinal stenosis, site unspecified: Secondary | ICD-10-CM | POA: Diagnosis not present

## 2016-02-27 DIAGNOSIS — M47816 Spondylosis without myelopathy or radiculopathy, lumbar region: Secondary | ICD-10-CM | POA: Diagnosis not present

## 2016-02-27 DIAGNOSIS — I10 Essential (primary) hypertension: Secondary | ICD-10-CM | POA: Diagnosis not present

## 2016-02-27 DIAGNOSIS — Z79899 Other long term (current) drug therapy: Secondary | ICD-10-CM | POA: Diagnosis not present

## 2016-02-27 DIAGNOSIS — M7551 Bursitis of right shoulder: Secondary | ICD-10-CM | POA: Diagnosis not present

## 2016-02-27 DIAGNOSIS — M810 Age-related osteoporosis without current pathological fracture: Secondary | ICD-10-CM | POA: Diagnosis not present

## 2016-02-27 DIAGNOSIS — M4312 Spondylolisthesis, cervical region: Secondary | ICD-10-CM | POA: Diagnosis not present

## 2016-02-27 DIAGNOSIS — Z79891 Long term (current) use of opiate analgesic: Secondary | ICD-10-CM | POA: Diagnosis not present

## 2016-02-27 DIAGNOSIS — Z9181 History of falling: Secondary | ICD-10-CM | POA: Diagnosis not present

## 2016-03-01 DIAGNOSIS — S32039S Unspecified fracture of third lumbar vertebra, sequela: Secondary | ICD-10-CM | POA: Diagnosis not present

## 2016-03-01 DIAGNOSIS — S43004S Unspecified dislocation of right shoulder joint, sequela: Secondary | ICD-10-CM | POA: Diagnosis not present

## 2016-03-01 DIAGNOSIS — M791 Myalgia: Secondary | ICD-10-CM | POA: Diagnosis not present

## 2016-03-01 DIAGNOSIS — M25512 Pain in left shoulder: Secondary | ICD-10-CM | POA: Diagnosis not present

## 2016-03-01 DIAGNOSIS — M7552 Bursitis of left shoulder: Secondary | ICD-10-CM | POA: Diagnosis not present

## 2016-03-01 DIAGNOSIS — M47892 Other spondylosis, cervical region: Secondary | ICD-10-CM | POA: Diagnosis not present

## 2016-03-05 DIAGNOSIS — Z9181 History of falling: Secondary | ICD-10-CM | POA: Diagnosis not present

## 2016-03-05 DIAGNOSIS — M47816 Spondylosis without myelopathy or radiculopathy, lumbar region: Secondary | ICD-10-CM | POA: Diagnosis not present

## 2016-03-05 DIAGNOSIS — M4312 Spondylolisthesis, cervical region: Secondary | ICD-10-CM | POA: Diagnosis not present

## 2016-03-05 DIAGNOSIS — M5136 Other intervertebral disc degeneration, lumbar region: Secondary | ICD-10-CM | POA: Diagnosis not present

## 2016-03-05 DIAGNOSIS — M4310 Spondylolisthesis, site unspecified: Secondary | ICD-10-CM | POA: Diagnosis not present

## 2016-03-05 DIAGNOSIS — I10 Essential (primary) hypertension: Secondary | ICD-10-CM | POA: Diagnosis not present

## 2016-03-05 DIAGNOSIS — M7551 Bursitis of right shoulder: Secondary | ICD-10-CM | POA: Diagnosis not present

## 2016-03-05 DIAGNOSIS — Z79899 Other long term (current) drug therapy: Secondary | ICD-10-CM | POA: Diagnosis not present

## 2016-03-05 DIAGNOSIS — M810 Age-related osteoporosis without current pathological fracture: Secondary | ICD-10-CM | POA: Diagnosis not present

## 2016-03-05 DIAGNOSIS — M4800 Spinal stenosis, site unspecified: Secondary | ICD-10-CM | POA: Diagnosis not present

## 2016-03-05 DIAGNOSIS — Z79891 Long term (current) use of opiate analgesic: Secondary | ICD-10-CM | POA: Diagnosis not present

## 2016-03-08 DIAGNOSIS — Z79899 Other long term (current) drug therapy: Secondary | ICD-10-CM | POA: Diagnosis not present

## 2016-03-08 DIAGNOSIS — M14671 Charcot's joint, right ankle and foot: Secondary | ICD-10-CM | POA: Diagnosis not present

## 2016-03-08 DIAGNOSIS — M150 Primary generalized (osteo)arthritis: Secondary | ICD-10-CM | POA: Diagnosis not present

## 2016-03-08 DIAGNOSIS — M0609 Rheumatoid arthritis without rheumatoid factor, multiple sites: Secondary | ICD-10-CM | POA: Diagnosis not present

## 2016-03-08 DIAGNOSIS — S43004S Unspecified dislocation of right shoulder joint, sequela: Secondary | ICD-10-CM | POA: Diagnosis not present

## 2016-03-08 DIAGNOSIS — T148 Other injury of unspecified body region: Secondary | ICD-10-CM | POA: Diagnosis not present

## 2016-03-12 DIAGNOSIS — M7551 Bursitis of right shoulder: Secondary | ICD-10-CM | POA: Diagnosis not present

## 2016-03-12 DIAGNOSIS — M5136 Other intervertebral disc degeneration, lumbar region: Secondary | ICD-10-CM | POA: Diagnosis not present

## 2016-03-12 DIAGNOSIS — M47816 Spondylosis without myelopathy or radiculopathy, lumbar region: Secondary | ICD-10-CM | POA: Diagnosis not present

## 2016-03-12 DIAGNOSIS — M4312 Spondylolisthesis, cervical region: Secondary | ICD-10-CM | POA: Diagnosis not present

## 2016-03-12 DIAGNOSIS — M4310 Spondylolisthesis, site unspecified: Secondary | ICD-10-CM | POA: Diagnosis not present

## 2016-03-12 DIAGNOSIS — Z79891 Long term (current) use of opiate analgesic: Secondary | ICD-10-CM | POA: Diagnosis not present

## 2016-03-12 DIAGNOSIS — M810 Age-related osteoporosis without current pathological fracture: Secondary | ICD-10-CM | POA: Diagnosis not present

## 2016-03-12 DIAGNOSIS — I10 Essential (primary) hypertension: Secondary | ICD-10-CM | POA: Diagnosis not present

## 2016-03-12 DIAGNOSIS — Z79899 Other long term (current) drug therapy: Secondary | ICD-10-CM | POA: Diagnosis not present

## 2016-03-12 DIAGNOSIS — M4800 Spinal stenosis, site unspecified: Secondary | ICD-10-CM | POA: Diagnosis not present

## 2016-03-12 DIAGNOSIS — Z9181 History of falling: Secondary | ICD-10-CM | POA: Diagnosis not present

## 2016-03-16 DIAGNOSIS — M150 Primary generalized (osteo)arthritis: Secondary | ICD-10-CM | POA: Diagnosis not present

## 2016-03-16 DIAGNOSIS — T148 Other injury of unspecified body region: Secondary | ICD-10-CM | POA: Diagnosis not present

## 2016-03-16 DIAGNOSIS — M14671 Charcot's joint, right ankle and foot: Secondary | ICD-10-CM | POA: Diagnosis not present

## 2016-03-16 DIAGNOSIS — M0609 Rheumatoid arthritis without rheumatoid factor, multiple sites: Secondary | ICD-10-CM | POA: Diagnosis not present

## 2016-03-16 DIAGNOSIS — Z79899 Other long term (current) drug therapy: Secondary | ICD-10-CM | POA: Diagnosis not present

## 2016-03-16 DIAGNOSIS — S43004S Unspecified dislocation of right shoulder joint, sequela: Secondary | ICD-10-CM | POA: Diagnosis not present

## 2016-04-05 DIAGNOSIS — M25552 Pain in left hip: Secondary | ICD-10-CM | POA: Diagnosis not present

## 2016-04-23 DIAGNOSIS — M25552 Pain in left hip: Secondary | ICD-10-CM | POA: Diagnosis not present

## 2016-04-23 DIAGNOSIS — M1612 Unilateral primary osteoarthritis, left hip: Secondary | ICD-10-CM | POA: Diagnosis not present

## 2016-05-03 DIAGNOSIS — S32039S Unspecified fracture of third lumbar vertebra, sequela: Secondary | ICD-10-CM | POA: Diagnosis not present

## 2016-05-03 DIAGNOSIS — M4316 Spondylolisthesis, lumbar region: Secondary | ICD-10-CM | POA: Diagnosis not present

## 2016-05-03 DIAGNOSIS — M7062 Trochanteric bursitis, left hip: Secondary | ICD-10-CM | POA: Diagnosis not present

## 2016-05-03 DIAGNOSIS — M5136 Other intervertebral disc degeneration, lumbar region: Secondary | ICD-10-CM | POA: Diagnosis not present

## 2016-05-03 DIAGNOSIS — M47812 Spondylosis without myelopathy or radiculopathy, cervical region: Secondary | ICD-10-CM | POA: Diagnosis not present

## 2016-05-21 DIAGNOSIS — M7062 Trochanteric bursitis, left hip: Secondary | ICD-10-CM | POA: Diagnosis not present

## 2016-06-06 ENCOUNTER — Encounter (HOSPITAL_COMMUNITY): Payer: Self-pay | Admitting: Emergency Medicine

## 2016-06-06 ENCOUNTER — Emergency Department (HOSPITAL_COMMUNITY)
Admission: EM | Admit: 2016-06-06 | Discharge: 2016-06-07 | Disposition: A | Discharge status: Home / Self Care | Payer: PPO | Attending: Emergency Medicine | Admitting: Emergency Medicine

## 2016-06-06 ENCOUNTER — Emergency Department (HOSPITAL_COMMUNITY): Payer: PPO

## 2016-06-06 DIAGNOSIS — S0990XA Unspecified injury of head, initial encounter: Secondary | ICD-10-CM | POA: Diagnosis not present

## 2016-06-06 DIAGNOSIS — Y9289 Other specified places as the place of occurrence of the external cause: Secondary | ICD-10-CM | POA: Insufficient documentation

## 2016-06-06 DIAGNOSIS — M47892 Other spondylosis, cervical region: Secondary | ICD-10-CM | POA: Diagnosis not present

## 2016-06-06 DIAGNOSIS — Z96651 Presence of right artificial knee joint: Secondary | ICD-10-CM | POA: Diagnosis not present

## 2016-06-06 DIAGNOSIS — W0110XA Fall on same level from slipping, tripping and stumbling with subsequent striking against unspecified object, initial encounter: Secondary | ICD-10-CM | POA: Insufficient documentation

## 2016-06-06 DIAGNOSIS — S0091XA Abrasion of unspecified part of head, initial encounter: Secondary | ICD-10-CM | POA: Diagnosis not present

## 2016-06-06 DIAGNOSIS — M47896 Other spondylosis, lumbar region: Secondary | ICD-10-CM | POA: Diagnosis not present

## 2016-06-06 DIAGNOSIS — R220 Localized swelling, mass and lump, head: Secondary | ICD-10-CM | POA: Diagnosis not present

## 2016-06-06 DIAGNOSIS — S60222A Contusion of left hand, initial encounter: Secondary | ICD-10-CM | POA: Insufficient documentation

## 2016-06-06 DIAGNOSIS — N189 Chronic kidney disease, unspecified: Secondary | ICD-10-CM | POA: Insufficient documentation

## 2016-06-06 DIAGNOSIS — S5011XA Contusion of right forearm, initial encounter: Secondary | ICD-10-CM | POA: Insufficient documentation

## 2016-06-06 DIAGNOSIS — Y999 Unspecified external cause status: Secondary | ICD-10-CM | POA: Insufficient documentation

## 2016-06-06 DIAGNOSIS — I130 Hypertensive heart and chronic kidney disease with heart failure and stage 1 through stage 4 chronic kidney disease, or unspecified chronic kidney disease: Secondary | ICD-10-CM | POA: Diagnosis not present

## 2016-06-06 DIAGNOSIS — S0993XA Unspecified injury of face, initial encounter: Secondary | ICD-10-CM | POA: Diagnosis not present

## 2016-06-06 DIAGNOSIS — S199XXA Unspecified injury of neck, initial encounter: Secondary | ICD-10-CM | POA: Diagnosis not present

## 2016-06-06 DIAGNOSIS — S098XXA Other specified injuries of head, initial encounter: Secondary | ICD-10-CM | POA: Diagnosis not present

## 2016-06-06 DIAGNOSIS — S0083XA Contusion of other part of head, initial encounter: Secondary | ICD-10-CM

## 2016-06-06 DIAGNOSIS — I5031 Acute diastolic (congestive) heart failure: Secondary | ICD-10-CM | POA: Insufficient documentation

## 2016-06-06 DIAGNOSIS — Y9301 Activity, walking, marching and hiking: Secondary | ICD-10-CM | POA: Diagnosis not present

## 2016-06-06 DIAGNOSIS — W19XXXA Unspecified fall, initial encounter: Secondary | ICD-10-CM

## 2016-06-06 DIAGNOSIS — M7989 Other specified soft tissue disorders: Secondary | ICD-10-CM | POA: Diagnosis not present

## 2016-06-06 DIAGNOSIS — M7062 Trochanteric bursitis, left hip: Secondary | ICD-10-CM | POA: Diagnosis not present

## 2016-06-06 MED ORDER — ACETAMINOPHEN 325 MG PO TABS
325.0000 mg | ORAL_TABLET | Freq: Once | ORAL | Status: AC
  Administered 2016-06-07: 325 mg via ORAL
  Filled 2016-06-06: qty 1

## 2016-06-06 NOTE — ED Provider Notes (Signed)
Patient tripped and fell tonight when she forgot released the brake on her walker. She struck her face as result of event. No loss of consciousness. She complains of mild facial pain. No other complaint. Patient alert Glasgow Coma Score 15. She has multiple ecchymosis to her face. Eyes appear normal. Neck is nontender. All 4 extremities or contusion abrasion or tenderness neurovascularly intact. Motor strength 5 over 5 overall   Orlie Dakin, MD 06/06/16 2317

## 2016-06-06 NOTE — ED Provider Notes (Signed)
[facility] DEPT Provider Note   CSN: [id number] Arrival date & time: 06/06/16  2004  First Provider Contact:  First MD Initiated Contact with Patient 06/06/16 2033        History   Chief Complaint Chief Complaint  Patient presents with  . Fall    HPI KIMYRA PRESSNALL is a 82 y.o. female.  HPI   ARLETTA TORELL is a 82 y.o. female, with a history of arthritis, HTN, and chronic neck/back pain, presenting to the ED with a mechanical fall that occurred about an hour ago. Pt states she was using her walker to walk to the bathroom, forgot to lock her walker wheels, and the walker slipped out from in front of her. Pt fell, striking her face on the floor. Complains of pain, swelling, and bruising to the forehead, nose, and upper lip. Some minor neck pain. Pt denies LOC, N/V, dizziness, chest pain, shortness of breath, neuro deficits, or any other complaints or injuries.  Denies anticoagulation.     Past Medical History:  Diagnosis Date  . Arthritis   . Benign essential tremor   . Cervical spondylosis   . Cervicogenic headache   . Chronic renal insufficiency   . Degenerative disc disease   . Depression   . Fibromyalgia   . Gait disorder   . GERD (gastroesophageal reflux disease)   . Hypertension   . Lumbar spondylosis   . Migraine headache   . Mild obesity   . Osteopenia   . Rheumatoid arthritis [facility])     Patient Active Problem List   Diagnosis Date Noted  . Acute diastolic congestive heart failure ([place]) 07/08/2015  . Acute respiratory failure with hypoxia ([place]) 07/08/2015  . Shoulder dislocation, recurrent 07/06/2015  . HTN (hypertension) 07/06/2015  . Rheumatoid arthritis ([place]) 07/06/2015  . GERD (gastroesophageal reflux disease) 07/06/2015  . Shoulder dislocation   . Concussion with no loss of consciousness 12/30/2013  . Cervical spondylosis without myelopathy 01/01/2013  . Migraine without aura 01/01/2013  . Abnormality of gait 01/01/2013  . Essential and other  specified forms of tremor 01/01/2013    Past Surgical History:  Procedure Laterality Date  . ABDOMINAL HYSTERECTOMY    . APPENDECTOMY    . CHOLECYSTECTOMY    . ELBOW SURGERY Left   . HAMMER TOE SURGERY Left   . SHOULDER CLOSED REDUCTION Right 07/08/2015   Procedure: RIGHT SHOULDER CLOSED REDUCTION;  Surgeon: Meredith Pel, MD;  Location: [place];  Service: Orthopedics;  Laterality: Right;  . TONSILLECTOMY    . TOTAL KNEE ARTHROPLASTY Bilateral    right    OB History    No data available       Home Medications    Prior to Admission medications   Medication Sig Start Date End Date Taking? Authorizing Provider  Calcium Carb-Cholecalciferol (CALCIUM 500 +D PO) Take 1 tablet by mouth 2 (two) times daily. Calcium 500 mg & Vitamin D 1000 units    Historical Provider, MD  diclofenac sodium (VOLTAREN) 1 % GEL Apply 1 application topically 2 (two) times daily as needed (pain).     Historical Provider, MD  DULoxetine (CYMBALTA) 30 MG capsule Take 60 mg by mouth daily.  05/14/13   Historical Provider, MD  fentaNYL (DURAGESIC - DOSED MCG/HR) 25 MCG/HR patch Place 1 patch (25 mcg total) onto the skin every 3 (three) days. 07/10/15   Janece Canterbury, MD  ferrous sulfate 325 (65 FE) MG tablet Take 325 mg by mouth every evening.  Historical Provider, MD  folic acid (FOLVITE) 1 MG tablet Take 3 mg by mouth daily.  12/29/13   Historical Provider, MD  hydrochlorothiazide (HYDRODIURIL) 25 MG tablet Take 12.5 mg by mouth daily.     Historical Provider, MD  hydroxychloroquine (PLAQUENIL) 200 MG tablet Take 400 mg by mouth daily.  02/17/15   Historical Provider, MD  methotrexate 50 MG/2ML injection Inject 0.7 mLs into the muscle every 7 (seven) days. 06/09/15   Historical Provider, MD  Oxycodone HCl 10 MG TABS Take 1 tablet (10 mg total) by mouth 3 (three) times daily. 07/10/15   Janece Canterbury, MD  pantoprazole (PROTONIX) 40 MG tablet Take 40 mg by mouth daily. 12/23/13   Historical Provider, MD    polyethylene glycol powder (MIRALAX) powder Take 17 g by mouth daily. 07/10/15   Janece Canterbury, MD  propranolol (INDERAL) 20 MG tablet Take 1 tablet (20 mg total) by mouth 2 (two) times daily. 07/10/15   Janece Canterbury, MD  senna (SENOKOT) 8.6 MG TABS tablet Take 2 tablets (17.2 mg total) by mouth at bedtime. 07/10/15   Janece Canterbury, MD    Family History Family History  Problem Relation Age of Onset  . Aortic aneurysm Father   . Lung cancer Sister   . Lung cancer Brother   . Alzheimer's disease Brother   . Dementia Brother   . Parkinsonism Brother   . Bone cancer Sister   . Anesthesia problems Neg Hx   . Hypertension Neg Hx     Social History Social History  Substance Use Topics  . Smoking status: Never Smoker  . Smokeless tobacco: Never Used  . Alcohol use No     Allergies   Feldene [piroxicam]; Clinoril [sulindac]; and Morphine and related   Review of Systems Review of Systems  Constitutional: Negative for diaphoresis.  HENT: Positive for facial swelling and nosebleeds.   Respiratory: Negative for shortness of breath.   Cardiovascular: Negative for chest pain.  Gastrointestinal: Negative for nausea and vomiting.  Neurological: Negative for dizziness, syncope, weakness, light-headedness, numbness and headaches.  All other systems reviewed and are negative.    Physical Exam Updated Vital Signs BP 150/82 (BP Location: Right Arm)   Pulse 69   Temp 98.5 F (36.9 C) (Oral)   Resp 16   Ht 5\' 2"  (1.575 m)   Wt 74.8 kg   SpO2 94%   BMI 30.18 kg/m   Physical Exam  Constitutional: She is oriented to person, place, and time. She appears well-developed and well-nourished. No distress.  HENT:  Head: Normocephalic.  Swelling and bruising to the left forehead and upper lip. Small laceration noted to the inside upper lip. Dried blood in both nares, no active hemorrhage. No discernible septal hematoma.   Eyes: Conjunctivae and EOM are normal. Pupils are equal,  round, and reactive to light.  Neck: Normal range of motion. Neck supple.  Cardiovascular: Normal rate, regular rhythm, normal heart sounds and intact distal pulses.   Pulmonary/Chest: Effort normal and breath sounds normal. No respiratory distress.  Abdominal: Soft. There is no tenderness. There is no guarding.  Musculoskeletal: She exhibits edema and tenderness.  Tenderness, bruising, and swelling to the right anterior forearm as well as to the left dorsal hand to the lesser extent. Full ROM in all extremities and spine. No midline spinal tenderness.   Neurological: She is alert and oriented to person, place, and time. She has normal reflexes.  Skin: Skin is warm and dry. She is not diaphoretic.  Psychiatric: She has a normal mood and affect. Her behavior is normal.  Nursing note and vitals reviewed.    ED Treatments / Results  Labs (all labs ordered are listed, but only abnormal results are displayed) Labs Reviewed - No data to display  EKG  EKG Interpretation None       Radiology Dg Forearm Right  Result Date: 06/06/2016 CLINICAL DATA:  Status post fall, with bruising along the anterior right forearm. Initial encounter. EXAM: RIGHT FOREARM - 2 VIEW COMPARISON:  Right wrist radiographs performed 06/23/2015 FINDINGS: Soft tissue swelling is noted along the radial volar aspect of the forearm. There is no evidence of fracture or dislocation. The radius and ulna appear grossly intact. The elbow joint is incompletely assessed, but appears grossly unremarkable. The carpal rows are grossly unremarkable in appearance. Minimal degenerative change is noted at the first carpometacarpal joint. IMPRESSION: Soft tissue swelling along the radial volar aspect of the forearm. No evidence of fracture or dislocation. Electronically Signed   By: Garald Balding M.D.   On: 06/06/2016 22:46   Ct Head Wo Contrast  Result Date: 06/06/2016 CLINICAL DATA:  Status post fall, with facial bruising and concern  for loosened teeth. Concern for head or cervical spine injury. Initial encounter. EXAM: CT HEAD WITHOUT CONTRAST CT MAXILLOFACIAL WITHOUT CONTRAST CT CERVICAL SPINE WITHOUT CONTRAST TECHNIQUE: Multidetector CT imaging of the head, cervical spine, and maxillofacial structures were performed using the standard protocol without intravenous contrast. Multiplanar CT image reconstructions of the cervical spine and maxillofacial structures were also generated. COMPARISON:  MRI/MRA of the brain performed 05/30/2011, CT of the head and cervical spine performed 11/19/2013, and MRI of the cervical spine performed 06/21/2015 FINDINGS: CT HEAD FINDINGS There is no evidence of acute infarction, mass lesion, or intra- or extra-axial hemorrhage on CT. Prominence of the ventricles and sulci reflects mild cortical volume loss. Scattered periventricular and subcortical white matter change likely reflects small vessel ischemic microangiopathy. Mild cerebellar atrophy is noted. The brainstem and fourth ventricle are within normal limits. The basal ganglia are unremarkable in appearance. The cerebral hemispheres demonstrate grossly normal gray-white differentiation. No mass effect or midline shift is seen. There is no evidence of fracture; visualized osseous structures are unremarkable in appearance. The visualized portions of the orbits are within normal limits. The paranasal sinuses and mastoid air cells are well-aerated. Soft tissue swelling is noted overlying the frontal calvarium, extending overlying the nose and about the left orbit. CT MAXILLOFACIAL FINDINGS There is no evidence of fracture or dislocation. The maxilla and mandible appear intact. The nasal bone is unremarkable in appearance. The remaining dentition appears grossly intact; there is mild additional bone formation about the mandibular teeth, which may reflect remote injury. Significant degenerative change is noted at the temporomandibular joints bilaterally, with  flattening of the mandibular condylar heads. The orbits are intact bilaterally. The visualized paranasal sinuses and mastoid air cells are well-aerated. Soft tissue swelling is noted overlying the frontal calvarium, extending overlying the nose and about the left orbit. The parapharyngeal fat planes are preserved. The nasopharynx, oropharynx and hypopharynx are unremarkable in appearance. The visualized portions of the valleculae and piriform sinuses are grossly unremarkable. The parotid and submandibular glands are within normal limits. No cervical lymphadenopathy is seen. CT CERVICAL SPINE FINDINGS There is no evidence of fracture or subluxation. Vertebral bodies demonstrate normal height. There is mild grade 1 anterolisthesis of C3 on C4, and multilevel disc space narrowing is noted along the cervical spine, with scattered anterior and  posterior disc osteophyte complexes. Underlying facet disease is noted. The thyroid gland is unremarkable in appearance. The visualized lung apices are clear. Prominent calcification is noted at the left carotid bifurcation. The common carotid arteries are partially retropharyngeal in course. IMPRESSION: 1. No evidence of traumatic intracranial injury or fracture. 2. No evidence of fracture or subluxation along the cervical spine. 3. No evidence of fracture or dislocation with regard to the maxillofacial structures. 4. Soft swelling overlying the frontal calvarium, extending overlying the nose and about the left orbit. 5. Mild cortical volume loss and scattered small vessel ischemic microangiopathy. 6. Degenerative change at the temporomandibular joints bilaterally, with flattening of the mandibular condylar heads. 7. Mild degenerative change along the cervical spine. 8. Prominent calcification at the left carotid bifurcation. Carotid ultrasound could be considered for further evaluation, when and as deemed clinically appropriate. Electronically Signed   By: Garald Balding M.D.    On: 06/06/2016 23:59   Ct Cervical Spine Wo Contrast  Result Date: 06/06/2016 CLINICAL DATA:  Status post fall, with facial bruising and concern for loosened teeth. Concern for head or cervical spine injury. Initial encounter. EXAM: CT HEAD WITHOUT CONTRAST CT MAXILLOFACIAL WITHOUT CONTRAST CT CERVICAL SPINE WITHOUT CONTRAST TECHNIQUE: Multidetector CT imaging of the head, cervical spine, and maxillofacial structures were performed using the standard protocol without intravenous contrast. Multiplanar CT image reconstructions of the cervical spine and maxillofacial structures were also generated. COMPARISON:  MRI/MRA of the brain performed 05/30/2011, CT of the head and cervical spine performed 11/19/2013, and MRI of the cervical spine performed 06/21/2015 FINDINGS: CT HEAD FINDINGS There is no evidence of acute infarction, mass lesion, or intra- or extra-axial hemorrhage on CT. Prominence of the ventricles and sulci reflects mild cortical volume loss. Scattered periventricular and subcortical white matter change likely reflects small vessel ischemic microangiopathy. Mild cerebellar atrophy is noted. The brainstem and fourth ventricle are within normal limits. The basal ganglia are unremarkable in appearance. The cerebral hemispheres demonstrate grossly normal gray-white differentiation. No mass effect or midline shift is seen. There is no evidence of fracture; visualized osseous structures are unremarkable in appearance. The visualized portions of the orbits are within normal limits. The paranasal sinuses and mastoid air cells are well-aerated. Soft tissue swelling is noted overlying the frontal calvarium, extending overlying the nose and about the left orbit. CT MAXILLOFACIAL FINDINGS There is no evidence of fracture or dislocation. The maxilla and mandible appear intact. The nasal bone is unremarkable in appearance. The remaining dentition appears grossly intact; there is mild additional bone formation about  the mandibular teeth, which may reflect remote injury. Significant degenerative change is noted at the temporomandibular joints bilaterally, with flattening of the mandibular condylar heads. The orbits are intact bilaterally. The visualized paranasal sinuses and mastoid air cells are well-aerated. Soft tissue swelling is noted overlying the frontal calvarium, extending overlying the nose and about the left orbit. The parapharyngeal fat planes are preserved. The nasopharynx, oropharynx and hypopharynx are unremarkable in appearance. The visualized portions of the valleculae and piriform sinuses are grossly unremarkable. The parotid and submandibular glands are within normal limits. No cervical lymphadenopathy is seen. CT CERVICAL SPINE FINDINGS There is no evidence of fracture or subluxation. Vertebral bodies demonstrate normal height. There is mild grade 1 anterolisthesis of C3 on C4, and multilevel disc space narrowing is noted along the cervical spine, with scattered anterior and posterior disc osteophyte complexes. Underlying facet disease is noted. The thyroid gland is unremarkable in appearance. The visualized lung apices are  clear. Prominent calcification is noted at the left carotid bifurcation. The common carotid arteries are partially retropharyngeal in course. IMPRESSION: 1. No evidence of traumatic intracranial injury or fracture. 2. No evidence of fracture or subluxation along the cervical spine. 3. No evidence of fracture or dislocation with regard to the maxillofacial structures. 4. Soft swelling overlying the frontal calvarium, extending overlying the nose and about the left orbit. 5. Mild cortical volume loss and scattered small vessel ischemic microangiopathy. 6. Degenerative change at the temporomandibular joints bilaterally, with flattening of the mandibular condylar heads. 7. Mild degenerative change along the cervical spine. 8. Prominent calcification at the left carotid bifurcation. Carotid  ultrasound could be considered for further evaluation, when and as deemed clinically appropriate. Electronically Signed   By: Garald Balding M.D.   On: 06/06/2016 23:59   Ct Maxillofacial Wo Contrast  Result Date: 06/06/2016 CLINICAL DATA:  Status post fall, with facial bruising and concern for loosened teeth. Concern for head or cervical spine injury. Initial encounter. EXAM: CT HEAD WITHOUT CONTRAST CT MAXILLOFACIAL WITHOUT CONTRAST CT CERVICAL SPINE WITHOUT CONTRAST TECHNIQUE: Multidetector CT imaging of the head, cervical spine, and maxillofacial structures were performed using the standard protocol without intravenous contrast. Multiplanar CT image reconstructions of the cervical spine and maxillofacial structures were also generated. COMPARISON:  MRI/MRA of the brain performed 05/30/2011, CT of the head and cervical spine performed 11/19/2013, and MRI of the cervical spine performed 06/21/2015 FINDINGS: CT HEAD FINDINGS There is no evidence of acute infarction, mass lesion, or intra- or extra-axial hemorrhage on CT. Prominence of the ventricles and sulci reflects mild cortical volume loss. Scattered periventricular and subcortical white matter change likely reflects small vessel ischemic microangiopathy. Mild cerebellar atrophy is noted. The brainstem and fourth ventricle are within normal limits. The basal ganglia are unremarkable in appearance. The cerebral hemispheres demonstrate grossly normal gray-white differentiation. No mass effect or midline shift is seen. There is no evidence of fracture; visualized osseous structures are unremarkable in appearance. The visualized portions of the orbits are within normal limits. The paranasal sinuses and mastoid air cells are well-aerated. Soft tissue swelling is noted overlying the frontal calvarium, extending overlying the nose and about the left orbit. CT MAXILLOFACIAL FINDINGS There is no evidence of fracture or dislocation. The maxilla and mandible appear  intact. The nasal bone is unremarkable in appearance. The remaining dentition appears grossly intact; there is mild additional bone formation about the mandibular teeth, which may reflect remote injury. Significant degenerative change is noted at the temporomandibular joints bilaterally, with flattening of the mandibular condylar heads. The orbits are intact bilaterally. The visualized paranasal sinuses and mastoid air cells are well-aerated. Soft tissue swelling is noted overlying the frontal calvarium, extending overlying the nose and about the left orbit. The parapharyngeal fat planes are preserved. The nasopharynx, oropharynx and hypopharynx are unremarkable in appearance. The visualized portions of the valleculae and piriform sinuses are grossly unremarkable. The parotid and submandibular glands are within normal limits. No cervical lymphadenopathy is seen. CT CERVICAL SPINE FINDINGS There is no evidence of fracture or subluxation. Vertebral bodies demonstrate normal height. There is mild grade 1 anterolisthesis of C3 on C4, and multilevel disc space narrowing is noted along the cervical spine, with scattered anterior and posterior disc osteophyte complexes. Underlying facet disease is noted. The thyroid gland is unremarkable in appearance. The visualized lung apices are clear. Prominent calcification is noted at the left carotid bifurcation. The common carotid arteries are partially retropharyngeal in course. IMPRESSION: 1. No  evidence of traumatic intracranial injury or fracture. 2. No evidence of fracture or subluxation along the cervical spine. 3. No evidence of fracture or dislocation with regard to the maxillofacial structures. 4. Soft swelling overlying the frontal calvarium, extending overlying the nose and about the left orbit. 5. Mild cortical volume loss and scattered small vessel ischemic microangiopathy. 6. Degenerative change at the temporomandibular joints bilaterally, with flattening of the  mandibular condylar heads. 7. Mild degenerative change along the cervical spine. 8. Prominent calcification at the left carotid bifurcation. Carotid ultrasound could be considered for further evaluation, when and as deemed clinically appropriate. Electronically Signed   By: Garald Balding M.D.   On: 06/06/2016 23:59    Procedures Procedures (including critical care time)  Medications Ordered in ED Medications  acetaminophen (TYLENOL) tablet 325 mg (325 mg Oral Given 06/07/16 0027)     Initial Impression / Assessment and Plan / ED Course  I have reviewed the triage vital signs and the nursing notes.  Pertinent labs & imaging results that were available during my care of the patient were reviewed by me and considered in my medical decision making (see chart for details).  Clinical Course    ZAMYRAH JASIN presents for evaluation following a mechanical fall earlier this evening.  Findings and plan of care discussed with Orlie Dakin, MD. Dr. Winfred Leeds personally evaluated and examined this patient.  Patient has no neuro deficits. The amount of facial bruising and the patient's age contributed to the reasoning for imaging. No bony abnormalities found on CT. Patient follow up with PCP. The patient was given instructions for home care as well as return precautions. Patient voices understanding of these instructions, accepts the plan, and is comfortable with discharge.   Vitals:   06/06/16 2315 06/07/16 0000 06/07/16 0001 06/07/16 0015  BP:  126/73 126/73 (!) 127/52  Pulse: (!) 59 60 61 61  Resp: 15 13 19 20   Temp:      TempSrc:      SpO2: 97% 92% 91% 95%  Weight:      Height:         Final Clinical Impressions(s) / ED Diagnoses   Final diagnoses:  Fall, initial encounter  Facial contusion, initial encounter  Head injury, initial encounter    New Prescriptions New Prescriptions   No medications on file     Lorayne Bender, PA-C 06/07/16 0032    Orlie Dakin,  MD 06/07/16 0040

## 2016-06-06 NOTE — ED Triage Notes (Signed)
Per EMS, pt has a mechanical fall, falling face first onto a tile floor.  No LOC, dizziness, nausea/vomiting or blood thinners.  She reports that she was on the floor for 15 -20 minutes.  She cleared the spinal assessment.  She had a nose bleed that has since stopped, swelling on her lip, hematoma above the left brow.

## 2016-06-06 NOTE — ED Notes (Signed)
Called CT and informed them she is ready to come for CT.

## 2016-06-07 NOTE — Discharge Instructions (Signed)
You have been seen today for evaluation of injuries from a fall. Your imaging showed no fractures or dislocations. Follow up with PCP as soon as possible for reevaluation. Return to ED should symptoms worsen. You have sustained a head injury. It does not appear to be serious at this time. Refer to the attached literature for details on symptoms that are expected and those that should bring you back to the ED.

## 2016-06-07 NOTE — ED Notes (Signed)
Pt was able to ambulate using her walker.  RN spoke to [employer] who provided her with a taxi voucher.  RN contacted [facility] who agreed to send a driver who would assist her to the door.

## 2016-06-08 ENCOUNTER — Other Ambulatory Visit: Payer: Self-pay

## 2016-06-08 NOTE — Patient Outreach (Signed)
[facility]) Care Management  06/08/2016  Dominique Mckee [birth year] [id number]  REFERRAL SOURCE:  Health team advantage REFERRAL REASON;  Emergency room visit on 06/06/16  SUBJECTIVE:  Telephone call to patient regarding  Health team advantage referral.  HIPAA verified with patient. Discussed and offered [facility] care management services. Patient states she is managing ok. Patient states she has her granddaughter, Dominique Mckee who is her caregiver and a Marine scientist. Patient states she has transportation to get to her doctor appointments. Patient states she is able to obtain her medications.  Patient states she has assistance with the senior resource center.  Patient states she is able to manage her medical conditions at this time.  Patient reports she was in the emergency room on Wednesday 06/06/16 due to a fall. Patient states she has some bruising on her face. Patient denies any other injuries. Patient denies any other falls within the past 6 months to 1 year. Patient refused [facility] care management services at this time. Patient agreed to receive [facility] care management outreach letter and brochure. Patient states she will discuss services with her granddaughter.   RNCM discussed with patient fall prevention.   PLAN: RNCM will refer patient to laveda Comer to close due to refusal of services.  RNCM will notify patients primary MD of closure.  RNCM will send patient EMMI education material regarding fall prevention.   Dominique Plowman RN,BSN,CCM [facility] Telephonic  [phone]

## 2016-06-12 ENCOUNTER — Other Ambulatory Visit: Payer: Self-pay | Admitting: Internal Medicine

## 2016-06-12 DIAGNOSIS — I10 Essential (primary) hypertension: Secondary | ICD-10-CM | POA: Diagnosis not present

## 2016-06-12 DIAGNOSIS — N183 Chronic kidney disease, stage 3 (moderate): Secondary | ICD-10-CM | POA: Diagnosis not present

## 2016-06-12 DIAGNOSIS — M519 Unspecified thoracic, thoracolumbar and lumbosacral intervertebral disc disorder: Secondary | ICD-10-CM | POA: Diagnosis not present

## 2016-06-12 DIAGNOSIS — Z1389 Encounter for screening for other disorder: Secondary | ICD-10-CM | POA: Diagnosis not present

## 2016-06-12 DIAGNOSIS — Z23 Encounter for immunization: Secondary | ICD-10-CM | POA: Diagnosis not present

## 2016-06-12 DIAGNOSIS — M069 Rheumatoid arthritis, unspecified: Secondary | ICD-10-CM | POA: Diagnosis not present

## 2016-06-12 DIAGNOSIS — G8929 Other chronic pain: Secondary | ICD-10-CM | POA: Diagnosis not present

## 2016-06-12 DIAGNOSIS — Z1231 Encounter for screening mammogram for malignant neoplasm of breast: Secondary | ICD-10-CM

## 2016-06-12 DIAGNOSIS — F329 Major depressive disorder, single episode, unspecified: Secondary | ICD-10-CM | POA: Diagnosis not present

## 2016-06-12 DIAGNOSIS — K219 Gastro-esophageal reflux disease without esophagitis: Secondary | ICD-10-CM | POA: Diagnosis not present

## 2016-06-12 DIAGNOSIS — Z0000 Encounter for general adult medical examination without abnormal findings: Secondary | ICD-10-CM | POA: Diagnosis not present

## 2016-06-12 DIAGNOSIS — R011 Cardiac murmur, unspecified: Secondary | ICD-10-CM | POA: Diagnosis not present

## 2016-06-12 DIAGNOSIS — I5030 Unspecified diastolic (congestive) heart failure: Secondary | ICD-10-CM | POA: Diagnosis not present

## 2016-06-14 ENCOUNTER — Other Ambulatory Visit: Payer: Self-pay | Admitting: Internal Medicine

## 2016-06-14 DIAGNOSIS — R011 Cardiac murmur, unspecified: Secondary | ICD-10-CM

## 2016-06-18 ENCOUNTER — Ambulatory Visit: Payer: PPO

## 2016-06-20 ENCOUNTER — Other Ambulatory Visit (HOSPITAL_COMMUNITY): Payer: PPO

## 2016-06-22 DIAGNOSIS — M14672 Charcot's joint, left ankle and foot: Secondary | ICD-10-CM | POA: Diagnosis not present

## 2016-06-22 DIAGNOSIS — M14671 Charcot's joint, right ankle and foot: Secondary | ICD-10-CM | POA: Diagnosis not present

## 2016-06-25 DIAGNOSIS — M0609 Rheumatoid arthritis without rheumatoid factor, multiple sites: Secondary | ICD-10-CM | POA: Diagnosis not present

## 2016-06-25 DIAGNOSIS — T148 Other injury of unspecified body region: Secondary | ICD-10-CM | POA: Diagnosis not present

## 2016-06-25 DIAGNOSIS — Z79899 Other long term (current) drug therapy: Secondary | ICD-10-CM | POA: Diagnosis not present

## 2016-06-25 DIAGNOSIS — S43004S Unspecified dislocation of right shoulder joint, sequela: Secondary | ICD-10-CM | POA: Diagnosis not present

## 2016-06-25 DIAGNOSIS — M8088XD Other osteoporosis with current pathological fracture, vertebra(e), subsequent encounter for fracture with routine healing: Secondary | ICD-10-CM | POA: Diagnosis not present

## 2016-06-25 DIAGNOSIS — M14671 Charcot's joint, right ankle and foot: Secondary | ICD-10-CM | POA: Diagnosis not present

## 2016-06-25 DIAGNOSIS — M150 Primary generalized (osteo)arthritis: Secondary | ICD-10-CM | POA: Diagnosis not present

## 2016-07-04 ENCOUNTER — Ambulatory Visit
Admission: RE | Admit: 2016-07-04 | Discharge: 2016-07-04 | Disposition: A | Discharge status: Home / Self Care | Payer: PPO | Source: Ambulatory Visit | Attending: Internal Medicine | Admitting: Internal Medicine

## 2016-07-04 DIAGNOSIS — Z1231 Encounter for screening mammogram for malignant neoplasm of breast: Secondary | ICD-10-CM

## 2016-07-05 DIAGNOSIS — N819 Female genital prolapse, unspecified: Secondary | ICD-10-CM | POA: Diagnosis not present

## 2016-07-05 DIAGNOSIS — N83209 Unspecified ovarian cyst, unspecified side: Secondary | ICD-10-CM | POA: Diagnosis not present

## 2016-07-05 DIAGNOSIS — N8111 Cystocele, midline: Secondary | ICD-10-CM | POA: Diagnosis not present

## 2016-07-06 ENCOUNTER — Other Ambulatory Visit (HOSPITAL_COMMUNITY): Payer: PPO

## 2016-07-11 DIAGNOSIS — Z79899 Other long term (current) drug therapy: Secondary | ICD-10-CM | POA: Diagnosis not present

## 2016-07-11 DIAGNOSIS — G894 Chronic pain syndrome: Secondary | ICD-10-CM | POA: Diagnosis not present

## 2016-07-11 DIAGNOSIS — Z79891 Long term (current) use of opiate analgesic: Secondary | ICD-10-CM | POA: Diagnosis not present

## 2016-07-11 DIAGNOSIS — M4722 Other spondylosis with radiculopathy, cervical region: Secondary | ICD-10-CM | POA: Diagnosis not present

## 2016-07-11 DIAGNOSIS — M5136 Other intervertebral disc degeneration, lumbar region: Secondary | ICD-10-CM | POA: Diagnosis not present

## 2016-07-11 DIAGNOSIS — M7062 Trochanteric bursitis, left hip: Secondary | ICD-10-CM | POA: Diagnosis not present

## 2016-07-12 ENCOUNTER — Ambulatory Visit (HOSPITAL_COMMUNITY): Payer: PPO | Attending: Cardiology

## 2016-07-12 ENCOUNTER — Other Ambulatory Visit: Payer: Self-pay

## 2016-07-12 DIAGNOSIS — I340 Nonrheumatic mitral (valve) insufficiency: Secondary | ICD-10-CM | POA: Diagnosis not present

## 2016-07-12 DIAGNOSIS — I509 Heart failure, unspecified: Secondary | ICD-10-CM | POA: Insufficient documentation

## 2016-07-12 DIAGNOSIS — I110 Hypertensive heart disease with heart failure: Secondary | ICD-10-CM | POA: Diagnosis not present

## 2016-07-12 DIAGNOSIS — R011 Cardiac murmur, unspecified: Secondary | ICD-10-CM | POA: Diagnosis not present

## 2016-07-12 DIAGNOSIS — I272 Other secondary pulmonary hypertension: Secondary | ICD-10-CM | POA: Insufficient documentation

## 2016-07-26 ENCOUNTER — Encounter: Payer: Self-pay | Admitting: *Deleted

## 2016-08-09 DIAGNOSIS — M4316 Spondylolisthesis, lumbar region: Secondary | ICD-10-CM | POA: Diagnosis not present

## 2016-08-09 DIAGNOSIS — M25552 Pain in left hip: Secondary | ICD-10-CM | POA: Diagnosis not present

## 2016-08-09 DIAGNOSIS — M5136 Other intervertebral disc degeneration, lumbar region: Secondary | ICD-10-CM | POA: Diagnosis not present

## 2016-08-09 DIAGNOSIS — Z6835 Body mass index (BMI) 35.0-35.9, adult: Secondary | ICD-10-CM | POA: Diagnosis not present

## 2016-08-09 DIAGNOSIS — M542 Cervicalgia: Secondary | ICD-10-CM | POA: Diagnosis not present

## 2016-08-12 NOTE — Progress Notes (Deleted)
New Outpatient Visit Date: 08/13/2016  Referring Provider: Wenda Low, MD [place]. [employer] [address], [place] [address]  Chief Complaint: Heart murmur  HPI:  Dominique Mckee is a 82 y.o. year-old female with history of hypertension, rheumatoid arthritis, benign essential tremor, and GERD, who has been referred by Dr. Lysle Rubens for ***.  --------------------------------------------------------------------------------------------------  Cardiovascular History & Procedures: Cardiovascular Problems:  ***  Risk Factors:  Hypertension and age  Cath/PCI:  ***  CV Surgery:  ***  EP Procedures and Devices:  ***  Non-Invasive Evaluation(s):  TTE (07/12/16): Normal LV size and contraction (55%) with mild LVH and grade 2 diastolic dysfunction with elevated filling pressure.  There is mitral annular calcification with highly-eccentric posteriorly directed MR that is probably severe.  Moderate left atrial enlargement.  Normal RV size and function.  Peak PA pressure 56 mmHg.  Mildly elevated CVP.  Recent CV Pertinent Labs: Lab Results  Component Value Date   CHOL 164 05/30/2011   HDL 48 05/30/2011   LDLCALC 101 (H) 05/30/2011   TRIG 74 05/30/2011   CHOLHDL 3.4 05/30/2011   INR 1.00 05/30/2011   K 4.0 07/10/2015   MG 2.2 05/30/2011   BUN 28 (H) 07/10/2015   CREATININE 1.10 (H) 07/10/2015    --------------------------------------------------------------------------------------------------  Past Medical History:  Diagnosis Date  . Arthritis   . Benign essential tremor   . Cervical spondylosis   . Cervicogenic headache   . Chronic renal insufficiency   . Degenerative disc disease   . Depression   . Fibromyalgia   . Gait disorder   . GERD (gastroesophageal reflux disease)   . Hypertension   . Lumbar spondylosis   . Migraine headache   . Mild obesity   . Osteopenia   . Rheumatoid arthritis [facility])     Past Surgical History:  Procedure Laterality Date  .  ABDOMINAL HYSTERECTOMY    . APPENDECTOMY    . CHOLECYSTECTOMY    . ELBOW SURGERY Left   . HAMMER TOE SURGERY Left   . SHOULDER CLOSED REDUCTION Right 07/08/2015   Procedure: RIGHT SHOULDER CLOSED REDUCTION;  Surgeon: Meredith Pel, MD;  Location: [facility];  Service: Orthopedics;  Laterality: Right;  . TONSILLECTOMY    . TOTAL KNEE ARTHROPLASTY Bilateral    right    Outpatient Encounter Prescriptions as of 08/13/2016  Medication Sig  . Calcium Carb-Cholecalciferol (CALCIUM 500 +D PO) Take 1 tablet by mouth 2 (two) times daily. Calcium 500 mg & Vitamin D 1000 units  . diclofenac sodium (VOLTAREN) 1 % GEL Apply 1 application topically 2 (two) times daily as needed (pain).   . DULoxetine (CYMBALTA) 30 MG capsule Take 60 mg by mouth daily.   . fentaNYL (DURAGESIC - DOSED MCG/HR) 25 MCG/HR patch Place 1 patch (25 mcg total) onto the skin every 3 (three) days.  . ferrous sulfate 325 (65 FE) MG tablet Take 325 mg by mouth every evening.  . folic acid (FOLVITE) 1 MG tablet Take 3 mg by mouth daily.   . hydrochlorothiazide (HYDRODIURIL) 25 MG tablet Take 12.5 mg by mouth daily.   . hydroxychloroquine (PLAQUENIL) 200 MG tablet Take 400 mg by mouth daily.   . methotrexate 50 MG/2ML injection Inject 0.7 mLs into the muscle every 7 (seven) days.  . Oxycodone HCl 10 MG TABS Take 1 tablet (10 mg total) by mouth 3 (three) times daily.  . pantoprazole (PROTONIX) 40 MG tablet Take 40 mg by mouth daily.  . polyethylene glycol powder (MIRALAX) powder  Take 17 g by mouth daily.  . propranolol (INDERAL) 20 MG tablet Take 1 tablet (20 mg total) by mouth 2 (two) times daily.  Marland Kitchen senna (SENOKOT) 8.6 MG TABS tablet Take 2 tablets (17.2 mg total) by mouth at bedtime.   No facility-administered encounter medications on file as of 08/13/2016.     Allergies: Feldene [piroxicam]; Clinoril [sulindac]; and Morphine and related  Social History   Social History  . Marital status: Widowed    Spouse name: N/A  .  Number of children: 2  . Years of education: 12   Occupational History  . Retired Retired   Social History Main Topics  . Smoking status: Never Smoker  . Smokeless tobacco: Never Used  . Alcohol use No  . Drug use: No  . Sexual activity: Not Currently   Other Topics Concern  . Not on file   Social History Narrative   Patient is right handed.   Patient drinks very little caffeine.    Family History  Problem Relation Age of Onset  . Osteoporosis Mother   . Intracerebral hemorrhage Mother 32  . AAA (abdominal aortic aneurysm) Father 47  . Lung cancer Brother   . Diabetes Brother   . Lung cancer Brother   . Diabetes Sister   . Lung cancer Sister   . Bone cancer Sister   . Kidney failure Son     left kidney transplant  . Diabetes type I Daughter   . Anesthesia problems Neg Hx   . Hypertension Neg Hx     Review of Systems: A 12-system review of systems was performed and was negative except as noted in the HPI.  --------------------------------------------------------------------------------------------------  Physical Exam: There were no vitals taken for this visit.  General:  *** HEENT: No conjunctival pallor or scleral icterus.  Moist mucous membranes.  OP clear. Neck: Supple without lymphadenopathy, thyromegaly, JVD, or HJR.  No carotid bruit. Lungs: Normal work of breathing.  Clear to auscultation bilaterally without wheezes or crackles. Heart: Regular rate and rhythm without murmurs, rubs, or gallops.  Non-displaced PMI. Abd: Bowel sounds present.  Soft, NT/ND without hepatosplenomegaly Ext: No lower extremity edema.  Radial, PT, and DP pulses are 2+ bilaterally Skin: warm and dry without rash Neuro: CNIII-XII intact.  Strength and fine-touch sensation intact in upper and lower extremities bilaterally. Psych: Normal mood and affect.  EKG:  ***  Lab Results  Component Value Date   WBC 9.5 07/10/2015   HGB 10.8 (L) 07/10/2015   HCT 33.3 (L) 07/10/2015    MCV 99.1 07/10/2015   PLT 235 07/10/2015    Lab Results  Component Value Date   NA 142 07/10/2015   K 4.0 07/10/2015   CL 106 07/10/2015   CO2 27 07/10/2015   BUN 28 (H) 07/10/2015   CREATININE 1.10 (H) 07/10/2015   GLUCOSE 92 07/10/2015   ALT 7 05/30/2011    Lab Results  Component Value Date   CHOL 164 05/30/2011   HDL 48 05/30/2011   LDLCALC 101 (H) 05/30/2011   TRIG 74 05/30/2011   CHOLHDL 3.4 05/30/2011     --------------------------------------------------------------------------------------------------  ASSESSMENT AND PLAN: Harrell Gave Ramesha Poster, MD 08/12/2016 8:09 PM

## 2016-08-13 ENCOUNTER — Institutional Professional Consult (permissible substitution): Payer: PPO | Admitting: Internal Medicine

## 2016-08-16 DIAGNOSIS — M5432 Sciatica, left side: Secondary | ICD-10-CM | POA: Diagnosis not present

## 2016-09-03 DIAGNOSIS — M5432 Sciatica, left side: Secondary | ICD-10-CM | POA: Diagnosis not present

## 2016-09-07 ENCOUNTER — Institutional Professional Consult (permissible substitution): Payer: PPO | Admitting: Internal Medicine

## 2016-09-11 ENCOUNTER — Other Ambulatory Visit: Payer: Self-pay | Admitting: Sports Medicine

## 2016-09-12 NOTE — Telephone Encounter (Signed)
Rx refill request

## 2016-09-18 ENCOUNTER — Other Ambulatory Visit: Payer: Self-pay | Admitting: Sports Medicine

## 2016-09-18 NOTE — Telephone Encounter (Signed)
Rx refill request

## 2016-09-24 DIAGNOSIS — M5432 Sciatica, left side: Secondary | ICD-10-CM | POA: Diagnosis not present

## 2016-09-25 DIAGNOSIS — Z6834 Body mass index (BMI) 34.0-34.9, adult: Secondary | ICD-10-CM | POA: Diagnosis not present

## 2016-09-25 DIAGNOSIS — M150 Primary generalized (osteo)arthritis: Secondary | ICD-10-CM | POA: Diagnosis not present

## 2016-09-25 DIAGNOSIS — M0609 Rheumatoid arthritis without rheumatoid factor, multiple sites: Secondary | ICD-10-CM | POA: Diagnosis not present

## 2016-09-25 DIAGNOSIS — E669 Obesity, unspecified: Secondary | ICD-10-CM | POA: Diagnosis not present

## 2016-09-25 DIAGNOSIS — M14671 Charcot's joint, right ankle and foot: Secondary | ICD-10-CM | POA: Diagnosis not present

## 2016-09-25 DIAGNOSIS — M545 Low back pain: Secondary | ICD-10-CM | POA: Diagnosis not present

## 2016-09-25 DIAGNOSIS — Z79899 Other long term (current) drug therapy: Secondary | ICD-10-CM | POA: Diagnosis not present

## 2016-09-28 DIAGNOSIS — S32039S Unspecified fracture of third lumbar vertebra, sequela: Secondary | ICD-10-CM | POA: Diagnosis not present

## 2016-09-28 DIAGNOSIS — M5136 Other intervertebral disc degeneration, lumbar region: Secondary | ICD-10-CM | POA: Diagnosis not present

## 2016-09-28 DIAGNOSIS — M4316 Spondylolisthesis, lumbar region: Secondary | ICD-10-CM | POA: Diagnosis not present

## 2016-09-28 DIAGNOSIS — Q762 Congenital spondylolisthesis: Secondary | ICD-10-CM | POA: Diagnosis not present

## 2016-10-11 ENCOUNTER — Other Ambulatory Visit: Payer: Self-pay | Admitting: Orthopedic Surgery

## 2016-10-11 DIAGNOSIS — M4310 Spondylolisthesis, site unspecified: Secondary | ICD-10-CM

## 2016-10-15 DIAGNOSIS — I340 Nonrheumatic mitral (valve) insufficiency: Secondary | ICD-10-CM | POA: Diagnosis not present

## 2016-10-15 DIAGNOSIS — N183 Chronic kidney disease, stage 3 (moderate): Secondary | ICD-10-CM | POA: Diagnosis not present

## 2016-10-15 DIAGNOSIS — G8929 Other chronic pain: Secondary | ICD-10-CM | POA: Diagnosis not present

## 2016-10-15 DIAGNOSIS — I10 Essential (primary) hypertension: Secondary | ICD-10-CM | POA: Diagnosis not present

## 2016-10-15 DIAGNOSIS — Z23 Encounter for immunization: Secondary | ICD-10-CM | POA: Diagnosis not present

## 2016-10-15 DIAGNOSIS — M48061 Spinal stenosis, lumbar region without neurogenic claudication: Secondary | ICD-10-CM | POA: Diagnosis not present

## 2016-10-15 DIAGNOSIS — K219 Gastro-esophageal reflux disease without esophagitis: Secondary | ICD-10-CM | POA: Diagnosis not present

## 2016-10-15 DIAGNOSIS — M069 Rheumatoid arthritis, unspecified: Secondary | ICD-10-CM | POA: Diagnosis not present

## 2016-10-15 DIAGNOSIS — F329 Major depressive disorder, single episode, unspecified: Secondary | ICD-10-CM | POA: Diagnosis not present

## 2016-10-18 ENCOUNTER — Other Ambulatory Visit: Payer: Self-pay | Admitting: Orthopedic Surgery

## 2016-10-18 ENCOUNTER — Ambulatory Visit
Admission: RE | Admit: 2016-10-18 | Discharge: 2016-10-18 | Disposition: A | Discharge status: Home / Self Care | Payer: PPO | Source: Ambulatory Visit | Attending: Orthopedic Surgery | Admitting: Orthopedic Surgery

## 2016-10-18 DIAGNOSIS — M4316 Spondylolisthesis, lumbar region: Secondary | ICD-10-CM | POA: Diagnosis not present

## 2016-10-18 DIAGNOSIS — Q762 Congenital spondylolisthesis: Secondary | ICD-10-CM | POA: Diagnosis not present

## 2016-10-18 DIAGNOSIS — N819 Female genital prolapse, unspecified: Secondary | ICD-10-CM | POA: Diagnosis not present

## 2016-10-18 DIAGNOSIS — R310 Gross hematuria: Secondary | ICD-10-CM | POA: Diagnosis not present

## 2016-10-18 DIAGNOSIS — G8929 Other chronic pain: Secondary | ICD-10-CM | POA: Diagnosis not present

## 2016-10-18 DIAGNOSIS — M4310 Spondylolisthesis, site unspecified: Secondary | ICD-10-CM

## 2016-10-18 DIAGNOSIS — Z9289 Personal history of other medical treatment: Secondary | ICD-10-CM | POA: Diagnosis not present

## 2016-10-18 DIAGNOSIS — S32039S Unspecified fracture of third lumbar vertebra, sequela: Secondary | ICD-10-CM | POA: Diagnosis not present

## 2016-10-18 DIAGNOSIS — M545 Low back pain: Secondary | ICD-10-CM | POA: Diagnosis not present

## 2016-10-18 DIAGNOSIS — M48061 Spinal stenosis, lumbar region without neurogenic claudication: Secondary | ICD-10-CM | POA: Diagnosis not present

## 2016-10-18 DIAGNOSIS — M5136 Other intervertebral disc degeneration, lumbar region: Secondary | ICD-10-CM | POA: Diagnosis not present

## 2016-10-18 DIAGNOSIS — N899 Noninflammatory disorder of vagina, unspecified: Secondary | ICD-10-CM | POA: Diagnosis not present

## 2016-10-18 DIAGNOSIS — N183 Chronic kidney disease, stage 3 (moderate): Secondary | ICD-10-CM | POA: Diagnosis not present

## 2016-11-08 DIAGNOSIS — M5432 Sciatica, left side: Secondary | ICD-10-CM | POA: Diagnosis not present

## 2016-11-08 DIAGNOSIS — M4316 Spondylolisthesis, lumbar region: Secondary | ICD-10-CM | POA: Diagnosis not present

## 2016-12-25 DIAGNOSIS — I129 Hypertensive chronic kidney disease with stage 1 through stage 4 chronic kidney disease, or unspecified chronic kidney disease: Secondary | ICD-10-CM | POA: Diagnosis not present

## 2016-12-25 DIAGNOSIS — M48062 Spinal stenosis, lumbar region with neurogenic claudication: Secondary | ICD-10-CM | POA: Diagnosis not present

## 2016-12-25 DIAGNOSIS — M810 Age-related osteoporosis without current pathological fracture: Secondary | ICD-10-CM | POA: Diagnosis not present

## 2016-12-25 DIAGNOSIS — M545 Low back pain: Secondary | ICD-10-CM | POA: Diagnosis not present

## 2016-12-25 DIAGNOSIS — Z886 Allergy status to analgesic agent status: Secondary | ICD-10-CM | POA: Diagnosis not present

## 2016-12-25 DIAGNOSIS — M4316 Spondylolisthesis, lumbar region: Secondary | ICD-10-CM | POA: Diagnosis not present

## 2016-12-25 DIAGNOSIS — Z888 Allergy status to other drugs, medicaments and biological substances status: Secondary | ICD-10-CM | POA: Diagnosis not present

## 2016-12-25 DIAGNOSIS — N189 Chronic kidney disease, unspecified: Secondary | ICD-10-CM | POA: Diagnosis not present

## 2016-12-25 DIAGNOSIS — Z885 Allergy status to narcotic agent status: Secondary | ICD-10-CM | POA: Diagnosis not present

## 2017-01-10 DIAGNOSIS — M069 Rheumatoid arthritis, unspecified: Secondary | ICD-10-CM | POA: Diagnosis not present

## 2017-01-10 DIAGNOSIS — N183 Chronic kidney disease, stage 3 (moderate): Secondary | ICD-10-CM | POA: Diagnosis not present

## 2017-01-10 DIAGNOSIS — H9190 Unspecified hearing loss, unspecified ear: Secondary | ICD-10-CM | POA: Diagnosis not present

## 2017-01-10 DIAGNOSIS — Z90710 Acquired absence of both cervix and uterus: Secondary | ICD-10-CM | POA: Diagnosis not present

## 2017-01-10 DIAGNOSIS — M48061 Spinal stenosis, lumbar region without neurogenic claudication: Secondary | ICD-10-CM | POA: Diagnosis not present

## 2017-01-10 DIAGNOSIS — N390 Urinary tract infection, site not specified: Secondary | ICD-10-CM | POA: Diagnosis not present

## 2017-01-10 DIAGNOSIS — M48062 Spinal stenosis, lumbar region with neurogenic claudication: Secondary | ICD-10-CM | POA: Diagnosis not present

## 2017-01-10 DIAGNOSIS — M4186 Other forms of scoliosis, lumbar region: Secondary | ICD-10-CM | POA: Diagnosis not present

## 2017-01-10 DIAGNOSIS — M419 Scoliosis, unspecified: Secondary | ICD-10-CM | POA: Diagnosis not present

## 2017-01-10 DIAGNOSIS — Z01818 Encounter for other preprocedural examination: Secondary | ICD-10-CM | POA: Diagnosis not present

## 2017-01-10 DIAGNOSIS — F39 Unspecified mood [affective] disorder: Secondary | ICD-10-CM | POA: Diagnosis not present

## 2017-01-10 DIAGNOSIS — M810 Age-related osteoporosis without current pathological fracture: Secondary | ICD-10-CM | POA: Diagnosis not present

## 2017-01-10 DIAGNOSIS — R400 Somnolence: Secondary | ICD-10-CM | POA: Diagnosis not present

## 2017-01-10 DIAGNOSIS — G8929 Other chronic pain: Secondary | ICD-10-CM | POA: Insufficient documentation

## 2017-01-10 DIAGNOSIS — E785 Hyperlipidemia, unspecified: Secondary | ICD-10-CM | POA: Diagnosis not present

## 2017-01-10 DIAGNOSIS — M1990 Unspecified osteoarthritis, unspecified site: Secondary | ICD-10-CM | POA: Diagnosis not present

## 2017-01-10 DIAGNOSIS — R251 Tremor, unspecified: Secondary | ICD-10-CM | POA: Diagnosis not present

## 2017-01-10 DIAGNOSIS — R011 Cardiac murmur, unspecified: Secondary | ICD-10-CM | POA: Insufficient documentation

## 2017-01-10 DIAGNOSIS — M4316 Spondylolisthesis, lumbar region: Secondary | ICD-10-CM | POA: Diagnosis not present

## 2017-01-10 DIAGNOSIS — D62 Acute posthemorrhagic anemia: Secondary | ICD-10-CM | POA: Diagnosis not present

## 2017-01-10 DIAGNOSIS — I272 Pulmonary hypertension, unspecified: Secondary | ICD-10-CM | POA: Diagnosis not present

## 2017-01-10 DIAGNOSIS — K5900 Constipation, unspecified: Secondary | ICD-10-CM | POA: Diagnosis not present

## 2017-01-10 DIAGNOSIS — R339 Retention of urine, unspecified: Secondary | ICD-10-CM | POA: Diagnosis not present

## 2017-01-10 DIAGNOSIS — B9620 Unspecified Escherichia coli [E. coli] as the cause of diseases classified elsewhere: Secondary | ICD-10-CM | POA: Diagnosis not present

## 2017-01-10 DIAGNOSIS — D649 Anemia, unspecified: Secondary | ICD-10-CM | POA: Insufficient documentation

## 2017-01-10 DIAGNOSIS — I340 Nonrheumatic mitral (valve) insufficiency: Secondary | ICD-10-CM | POA: Diagnosis not present

## 2017-01-10 DIAGNOSIS — I129 Hypertensive chronic kidney disease with stage 1 through stage 4 chronic kidney disease, or unspecified chronic kidney disease: Secondary | ICD-10-CM | POA: Diagnosis not present

## 2017-01-10 DIAGNOSIS — G253 Myoclonus: Secondary | ICD-10-CM | POA: Diagnosis not present

## 2017-01-11 DIAGNOSIS — I10 Essential (primary) hypertension: Secondary | ICD-10-CM | POA: Diagnosis not present

## 2017-01-11 DIAGNOSIS — N189 Chronic kidney disease, unspecified: Secondary | ICD-10-CM | POA: Insufficient documentation

## 2017-01-14 DIAGNOSIS — R21 Rash and other nonspecific skin eruption: Secondary | ICD-10-CM | POA: Diagnosis not present

## 2017-01-14 DIAGNOSIS — E876 Hypokalemia: Secondary | ICD-10-CM | POA: Diagnosis not present

## 2017-01-14 DIAGNOSIS — N183 Chronic kidney disease, stage 3 (moderate): Secondary | ICD-10-CM | POA: Diagnosis not present

## 2017-01-14 DIAGNOSIS — D72829 Elevated white blood cell count, unspecified: Secondary | ICD-10-CM | POA: Diagnosis not present

## 2017-01-15 DIAGNOSIS — N183 Chronic kidney disease, stage 3 (moderate): Secondary | ICD-10-CM | POA: Diagnosis not present

## 2017-01-15 DIAGNOSIS — D7289 Other specified disorders of white blood cells: Secondary | ICD-10-CM | POA: Diagnosis not present

## 2017-01-22 DIAGNOSIS — N390 Urinary tract infection, site not specified: Secondary | ICD-10-CM | POA: Diagnosis not present

## 2017-01-22 DIAGNOSIS — E876 Hypokalemia: Secondary | ICD-10-CM | POA: Diagnosis not present

## 2017-01-22 DIAGNOSIS — Z01818 Encounter for other preprocedural examination: Secondary | ICD-10-CM | POA: Diagnosis not present

## 2017-01-30 DIAGNOSIS — Z419 Encounter for procedure for purposes other than remedying health state, unspecified: Secondary | ICD-10-CM | POA: Diagnosis not present

## 2017-01-30 DIAGNOSIS — I4510 Unspecified right bundle-branch block: Secondary | ICD-10-CM | POA: Diagnosis not present

## 2017-01-30 DIAGNOSIS — I340 Nonrheumatic mitral (valve) insufficiency: Secondary | ICD-10-CM | POA: Diagnosis not present

## 2017-02-06 DIAGNOSIS — I517 Cardiomegaly: Secondary | ICD-10-CM | POA: Diagnosis not present

## 2017-02-06 DIAGNOSIS — I340 Nonrheumatic mitral (valve) insufficiency: Secondary | ICD-10-CM | POA: Diagnosis not present

## 2017-02-08 ENCOUNTER — Ambulatory Visit: Payer: PPO | Admitting: Cardiology

## 2017-02-26 HISTORY — PX: LUMBAR FUSION: SHX111

## 2017-02-27 DIAGNOSIS — N390 Urinary tract infection, site not specified: Secondary | ICD-10-CM | POA: Diagnosis not present

## 2017-02-27 DIAGNOSIS — R634 Abnormal weight loss: Secondary | ICD-10-CM | POA: Diagnosis not present

## 2017-02-27 DIAGNOSIS — R42 Dizziness and giddiness: Secondary | ICD-10-CM | POA: Diagnosis not present

## 2017-02-27 DIAGNOSIS — Z6826 Body mass index (BMI) 26.0-26.9, adult: Secondary | ICD-10-CM | POA: Diagnosis not present

## 2017-02-27 DIAGNOSIS — F99 Mental disorder, not otherwise specified: Secondary | ICD-10-CM | POA: Diagnosis not present

## 2017-03-03 DIAGNOSIS — I130 Hypertensive heart and chronic kidney disease with heart failure and stage 1 through stage 4 chronic kidney disease, or unspecified chronic kidney disease: Secondary | ICD-10-CM | POA: Diagnosis not present

## 2017-03-03 DIAGNOSIS — F329 Major depressive disorder, single episode, unspecified: Secondary | ICD-10-CM | POA: Diagnosis not present

## 2017-03-03 DIAGNOSIS — N183 Chronic kidney disease, stage 3 (moderate): Secondary | ICD-10-CM | POA: Diagnosis not present

## 2017-03-03 DIAGNOSIS — R42 Dizziness and giddiness: Secondary | ICD-10-CM | POA: Diagnosis not present

## 2017-03-03 DIAGNOSIS — R410 Disorientation, unspecified: Secondary | ICD-10-CM | POA: Diagnosis not present

## 2017-03-03 DIAGNOSIS — F419 Anxiety disorder, unspecified: Secondary | ICD-10-CM | POA: Diagnosis not present

## 2017-03-03 DIAGNOSIS — R634 Abnormal weight loss: Secondary | ICD-10-CM | POA: Diagnosis not present

## 2017-03-03 DIAGNOSIS — Z6826 Body mass index (BMI) 26.0-26.9, adult: Secondary | ICD-10-CM | POA: Diagnosis not present

## 2017-03-03 DIAGNOSIS — M48061 Spinal stenosis, lumbar region without neurogenic claudication: Secondary | ICD-10-CM | POA: Diagnosis not present

## 2017-03-03 DIAGNOSIS — M069 Rheumatoid arthritis, unspecified: Secondary | ICD-10-CM | POA: Diagnosis not present

## 2017-03-03 DIAGNOSIS — I5032 Chronic diastolic (congestive) heart failure: Secondary | ICD-10-CM | POA: Diagnosis not present

## 2017-03-03 DIAGNOSIS — M5136 Other intervertebral disc degeneration, lumbar region: Secondary | ICD-10-CM | POA: Diagnosis not present

## 2017-03-03 DIAGNOSIS — Z79891 Long term (current) use of opiate analgesic: Secondary | ICD-10-CM | POA: Diagnosis not present

## 2017-03-03 DIAGNOSIS — M797 Fibromyalgia: Secondary | ICD-10-CM | POA: Diagnosis not present

## 2017-03-03 DIAGNOSIS — M4802 Spinal stenosis, cervical region: Secondary | ICD-10-CM | POA: Diagnosis not present

## 2017-03-03 DIAGNOSIS — R630 Anorexia: Secondary | ICD-10-CM | POA: Diagnosis not present

## 2017-03-03 DIAGNOSIS — Z87440 Personal history of urinary (tract) infections: Secondary | ICD-10-CM | POA: Diagnosis not present

## 2017-03-11 DIAGNOSIS — N183 Chronic kidney disease, stage 3 (moderate): Secondary | ICD-10-CM | POA: Diagnosis not present

## 2017-03-11 DIAGNOSIS — M5136 Other intervertebral disc degeneration, lumbar region: Secondary | ICD-10-CM | POA: Diagnosis not present

## 2017-03-11 DIAGNOSIS — M48061 Spinal stenosis, lumbar region without neurogenic claudication: Secondary | ICD-10-CM | POA: Diagnosis not present

## 2017-03-11 DIAGNOSIS — F329 Major depressive disorder, single episode, unspecified: Secondary | ICD-10-CM | POA: Diagnosis not present

## 2017-03-11 DIAGNOSIS — M4802 Spinal stenosis, cervical region: Secondary | ICD-10-CM | POA: Diagnosis not present

## 2017-03-11 DIAGNOSIS — M069 Rheumatoid arthritis, unspecified: Secondary | ICD-10-CM | POA: Diagnosis not present

## 2017-03-11 DIAGNOSIS — M797 Fibromyalgia: Secondary | ICD-10-CM | POA: Diagnosis not present

## 2017-03-11 DIAGNOSIS — I130 Hypertensive heart and chronic kidney disease with heart failure and stage 1 through stage 4 chronic kidney disease, or unspecified chronic kidney disease: Secondary | ICD-10-CM | POA: Diagnosis not present

## 2017-03-11 DIAGNOSIS — R630 Anorexia: Secondary | ICD-10-CM | POA: Diagnosis not present

## 2017-03-11 DIAGNOSIS — F419 Anxiety disorder, unspecified: Secondary | ICD-10-CM | POA: Diagnosis not present

## 2017-03-11 DIAGNOSIS — R42 Dizziness and giddiness: Secondary | ICD-10-CM | POA: Diagnosis not present

## 2017-03-11 DIAGNOSIS — R410 Disorientation, unspecified: Secondary | ICD-10-CM | POA: Diagnosis not present

## 2017-03-11 DIAGNOSIS — Z6826 Body mass index (BMI) 26.0-26.9, adult: Secondary | ICD-10-CM | POA: Diagnosis not present

## 2017-03-11 DIAGNOSIS — I5032 Chronic diastolic (congestive) heart failure: Secondary | ICD-10-CM | POA: Diagnosis not present

## 2017-03-11 DIAGNOSIS — Z79891 Long term (current) use of opiate analgesic: Secondary | ICD-10-CM | POA: Diagnosis not present

## 2017-03-11 DIAGNOSIS — R634 Abnormal weight loss: Secondary | ICD-10-CM | POA: Diagnosis not present

## 2017-03-11 DIAGNOSIS — Z87440 Personal history of urinary (tract) infections: Secondary | ICD-10-CM | POA: Diagnosis not present

## 2017-03-15 DIAGNOSIS — G253 Myoclonus: Secondary | ICD-10-CM | POA: Diagnosis not present

## 2017-03-15 DIAGNOSIS — I272 Pulmonary hypertension, unspecified: Secondary | ICD-10-CM | POA: Diagnosis not present

## 2017-03-15 DIAGNOSIS — K5900 Constipation, unspecified: Secondary | ICD-10-CM | POA: Diagnosis not present

## 2017-03-15 DIAGNOSIS — N183 Chronic kidney disease, stage 3 (moderate): Secondary | ICD-10-CM | POA: Diagnosis not present

## 2017-03-15 DIAGNOSIS — I340 Nonrheumatic mitral (valve) insufficiency: Secondary | ICD-10-CM | POA: Diagnosis not present

## 2017-03-15 DIAGNOSIS — Z981 Arthrodesis status: Secondary | ICD-10-CM | POA: Diagnosis not present

## 2017-03-15 DIAGNOSIS — M4186 Other forms of scoliosis, lumbar region: Secondary | ICD-10-CM | POA: Diagnosis not present

## 2017-03-15 DIAGNOSIS — M4316 Spondylolisthesis, lumbar region: Secondary | ICD-10-CM | POA: Diagnosis not present

## 2017-03-15 DIAGNOSIS — M25852 Other specified joint disorders, left hip: Secondary | ICD-10-CM | POA: Diagnosis not present

## 2017-03-15 DIAGNOSIS — I129 Hypertensive chronic kidney disease with stage 1 through stage 4 chronic kidney disease, or unspecified chronic kidney disease: Secondary | ICD-10-CM | POA: Diagnosis not present

## 2017-03-15 DIAGNOSIS — M5136 Other intervertebral disc degeneration, lumbar region: Secondary | ICD-10-CM | POA: Diagnosis not present

## 2017-03-15 DIAGNOSIS — R251 Tremor, unspecified: Secondary | ICD-10-CM | POA: Diagnosis not present

## 2017-03-15 DIAGNOSIS — M810 Age-related osteoporosis without current pathological fracture: Secondary | ICD-10-CM | POA: Diagnosis not present

## 2017-03-15 DIAGNOSIS — N390 Urinary tract infection, site not specified: Secondary | ICD-10-CM | POA: Diagnosis not present

## 2017-03-15 DIAGNOSIS — M069 Rheumatoid arthritis, unspecified: Secondary | ICD-10-CM | POA: Diagnosis not present

## 2017-03-15 DIAGNOSIS — M25851 Other specified joint disorders, right hip: Secondary | ICD-10-CM | POA: Diagnosis not present

## 2017-03-15 DIAGNOSIS — B9620 Unspecified Escherichia coli [E. coli] as the cause of diseases classified elsewhere: Secondary | ICD-10-CM | POA: Diagnosis not present

## 2017-03-15 DIAGNOSIS — R400 Somnolence: Secondary | ICD-10-CM | POA: Diagnosis not present

## 2017-03-15 DIAGNOSIS — H9190 Unspecified hearing loss, unspecified ear: Secondary | ICD-10-CM | POA: Diagnosis not present

## 2017-03-15 DIAGNOSIS — M48061 Spinal stenosis, lumbar region without neurogenic claudication: Secondary | ICD-10-CM | POA: Insufficient documentation

## 2017-03-15 DIAGNOSIS — G8929 Other chronic pain: Secondary | ICD-10-CM | POA: Diagnosis not present

## 2017-03-15 DIAGNOSIS — M532X6 Spinal instabilities, lumbar region: Secondary | ICD-10-CM | POA: Diagnosis not present

## 2017-03-15 DIAGNOSIS — D62 Acute posthemorrhagic anemia: Secondary | ICD-10-CM | POA: Diagnosis not present

## 2017-03-15 DIAGNOSIS — I700 Atherosclerosis of aorta: Secondary | ICD-10-CM | POA: Diagnosis not present

## 2017-03-15 DIAGNOSIS — M4326 Fusion of spine, lumbar region: Secondary | ICD-10-CM | POA: Diagnosis not present

## 2017-03-15 DIAGNOSIS — M4304 Spondylolysis, thoracic region: Secondary | ICD-10-CM | POA: Diagnosis not present

## 2017-03-15 DIAGNOSIS — M1990 Unspecified osteoarthritis, unspecified site: Secondary | ICD-10-CM | POA: Diagnosis not present

## 2017-03-15 DIAGNOSIS — R339 Retention of urine, unspecified: Secondary | ICD-10-CM | POA: Diagnosis not present

## 2017-03-15 DIAGNOSIS — F39 Unspecified mood [affective] disorder: Secondary | ICD-10-CM | POA: Diagnosis not present

## 2017-03-15 DIAGNOSIS — M8580 Other specified disorders of bone density and structure, unspecified site: Secondary | ICD-10-CM | POA: Diagnosis not present

## 2017-03-15 DIAGNOSIS — E785 Hyperlipidemia, unspecified: Secondary | ICD-10-CM | POA: Diagnosis not present

## 2017-03-15 DIAGNOSIS — M48062 Spinal stenosis, lumbar region with neurogenic claudication: Secondary | ICD-10-CM | POA: Diagnosis not present

## 2017-03-15 DIAGNOSIS — M4317 Spondylolisthesis, lumbosacral region: Secondary | ICD-10-CM | POA: Diagnosis not present

## 2017-03-15 DIAGNOSIS — Z90710 Acquired absence of both cervix and uterus: Secondary | ICD-10-CM | POA: Diagnosis not present

## 2017-03-16 DIAGNOSIS — I4510 Unspecified right bundle-branch block: Secondary | ICD-10-CM | POA: Diagnosis not present

## 2017-03-16 DIAGNOSIS — M4326 Fusion of spine, lumbar region: Secondary | ICD-10-CM | POA: Diagnosis not present

## 2017-03-16 DIAGNOSIS — N183 Chronic kidney disease, stage 3 (moderate): Secondary | ICD-10-CM | POA: Diagnosis not present

## 2017-03-16 DIAGNOSIS — M069 Rheumatoid arthritis, unspecified: Secondary | ICD-10-CM | POA: Diagnosis not present

## 2017-03-16 DIAGNOSIS — K5900 Constipation, unspecified: Secondary | ICD-10-CM | POA: Diagnosis not present

## 2017-03-16 DIAGNOSIS — F39 Unspecified mood [affective] disorder: Secondary | ICD-10-CM | POA: Diagnosis not present

## 2017-03-16 DIAGNOSIS — E785 Hyperlipidemia, unspecified: Secondary | ICD-10-CM | POA: Diagnosis not present

## 2017-03-16 DIAGNOSIS — G8918 Other acute postprocedural pain: Secondary | ICD-10-CM | POA: Diagnosis not present

## 2017-03-16 DIAGNOSIS — I340 Nonrheumatic mitral (valve) insufficiency: Secondary | ICD-10-CM | POA: Diagnosis not present

## 2017-03-16 DIAGNOSIS — G8929 Other chronic pain: Secondary | ICD-10-CM | POA: Diagnosis not present

## 2017-03-16 DIAGNOSIS — M545 Low back pain: Secondary | ICD-10-CM | POA: Diagnosis not present

## 2017-03-16 DIAGNOSIS — I272 Pulmonary hypertension, unspecified: Secondary | ICD-10-CM | POA: Diagnosis not present

## 2017-03-16 DIAGNOSIS — G8928 Other chronic postprocedural pain: Secondary | ICD-10-CM | POA: Diagnosis not present

## 2017-03-17 DIAGNOSIS — M069 Rheumatoid arthritis, unspecified: Secondary | ICD-10-CM | POA: Diagnosis not present

## 2017-03-17 DIAGNOSIS — I272 Pulmonary hypertension, unspecified: Secondary | ICD-10-CM | POA: Diagnosis not present

## 2017-03-17 DIAGNOSIS — K5900 Constipation, unspecified: Secondary | ICD-10-CM | POA: Diagnosis not present

## 2017-03-17 DIAGNOSIS — F39 Unspecified mood [affective] disorder: Secondary | ICD-10-CM | POA: Diagnosis not present

## 2017-03-17 DIAGNOSIS — E785 Hyperlipidemia, unspecified: Secondary | ICD-10-CM | POA: Diagnosis not present

## 2017-03-17 DIAGNOSIS — I340 Nonrheumatic mitral (valve) insufficiency: Secondary | ICD-10-CM | POA: Diagnosis not present

## 2017-03-17 DIAGNOSIS — N183 Chronic kidney disease, stage 3 (moderate): Secondary | ICD-10-CM | POA: Diagnosis not present

## 2017-03-17 DIAGNOSIS — M4326 Fusion of spine, lumbar region: Secondary | ICD-10-CM | POA: Diagnosis not present

## 2017-03-17 DIAGNOSIS — G8929 Other chronic pain: Secondary | ICD-10-CM | POA: Diagnosis not present

## 2017-03-18 DIAGNOSIS — M48062 Spinal stenosis, lumbar region with neurogenic claudication: Secondary | ICD-10-CM | POA: Diagnosis not present

## 2017-03-19 DIAGNOSIS — R109 Unspecified abdominal pain: Secondary | ICD-10-CM | POA: Diagnosis not present

## 2017-03-19 DIAGNOSIS — R001 Bradycardia, unspecified: Secondary | ICD-10-CM | POA: Diagnosis not present

## 2017-03-19 DIAGNOSIS — I4510 Unspecified right bundle-branch block: Secondary | ICD-10-CM | POA: Diagnosis not present

## 2017-03-21 DIAGNOSIS — E860 Dehydration: Secondary | ICD-10-CM | POA: Diagnosis not present

## 2017-03-21 DIAGNOSIS — F324 Major depressive disorder, single episode, in partial remission: Secondary | ICD-10-CM | POA: Diagnosis not present

## 2017-03-21 DIAGNOSIS — M6281 Muscle weakness (generalized): Secondary | ICD-10-CM | POA: Diagnosis not present

## 2017-03-21 DIAGNOSIS — M5136 Other intervertebral disc degeneration, lumbar region: Secondary | ICD-10-CM | POA: Diagnosis not present

## 2017-03-21 DIAGNOSIS — F05 Delirium due to known physiological condition: Secondary | ICD-10-CM | POA: Diagnosis not present

## 2017-03-21 DIAGNOSIS — R5381 Other malaise: Secondary | ICD-10-CM | POA: Diagnosis not present

## 2017-03-21 DIAGNOSIS — R0989 Other specified symptoms and signs involving the circulatory and respiratory systems: Secondary | ICD-10-CM | POA: Diagnosis not present

## 2017-03-21 DIAGNOSIS — R262 Difficulty in walking, not elsewhere classified: Secondary | ICD-10-CM | POA: Diagnosis not present

## 2017-03-21 DIAGNOSIS — I10 Essential (primary) hypertension: Secondary | ICD-10-CM | POA: Diagnosis not present

## 2017-03-21 DIAGNOSIS — M48062 Spinal stenosis, lumbar region with neurogenic claudication: Secondary | ICD-10-CM | POA: Diagnosis not present

## 2017-03-21 DIAGNOSIS — L98429 Non-pressure chronic ulcer of back with unspecified severity: Secondary | ICD-10-CM | POA: Diagnosis not present

## 2017-03-21 DIAGNOSIS — Z471 Aftercare following joint replacement surgery: Secondary | ICD-10-CM | POA: Diagnosis not present

## 2017-03-21 DIAGNOSIS — F339 Major depressive disorder, recurrent, unspecified: Secondary | ICD-10-CM | POA: Diagnosis not present

## 2017-03-21 DIAGNOSIS — M069 Rheumatoid arthritis, unspecified: Secondary | ICD-10-CM | POA: Diagnosis not present

## 2017-03-21 DIAGNOSIS — M549 Dorsalgia, unspecified: Secondary | ICD-10-CM | POA: Diagnosis not present

## 2017-03-21 DIAGNOSIS — G4700 Insomnia, unspecified: Secondary | ICD-10-CM | POA: Diagnosis not present

## 2017-03-21 DIAGNOSIS — R05 Cough: Secondary | ICD-10-CM | POA: Diagnosis not present

## 2017-03-21 DIAGNOSIS — Z9889 Other specified postprocedural states: Secondary | ICD-10-CM | POA: Diagnosis not present

## 2017-03-21 DIAGNOSIS — G894 Chronic pain syndrome: Secondary | ICD-10-CM | POA: Diagnosis not present

## 2017-03-21 DIAGNOSIS — N390 Urinary tract infection, site not specified: Secondary | ICD-10-CM | POA: Diagnosis not present

## 2017-03-21 DIAGNOSIS — Z4789 Encounter for other orthopedic aftercare: Secondary | ICD-10-CM | POA: Diagnosis not present

## 2017-03-21 DIAGNOSIS — M4800 Spinal stenosis, site unspecified: Secondary | ICD-10-CM | POA: Diagnosis not present

## 2017-03-21 DIAGNOSIS — D649 Anemia, unspecified: Secondary | ICD-10-CM | POA: Diagnosis not present

## 2017-03-21 DIAGNOSIS — M25552 Pain in left hip: Secondary | ICD-10-CM | POA: Diagnosis not present

## 2017-03-21 DIAGNOSIS — M545 Low back pain: Secondary | ICD-10-CM | POA: Diagnosis not present

## 2017-03-21 DIAGNOSIS — Z981 Arthrodesis status: Secondary | ICD-10-CM | POA: Diagnosis not present

## 2017-03-21 DIAGNOSIS — R309 Painful micturition, unspecified: Secondary | ICD-10-CM | POA: Diagnosis not present

## 2017-03-21 DIAGNOSIS — N3289 Other specified disorders of bladder: Secondary | ICD-10-CM | POA: Diagnosis not present

## 2017-03-21 DIAGNOSIS — N189 Chronic kidney disease, unspecified: Secondary | ICD-10-CM | POA: Diagnosis not present

## 2017-03-22 DIAGNOSIS — I10 Essential (primary) hypertension: Secondary | ICD-10-CM | POA: Diagnosis not present

## 2017-03-22 DIAGNOSIS — F324 Major depressive disorder, single episode, in partial remission: Secondary | ICD-10-CM | POA: Diagnosis not present

## 2017-03-22 DIAGNOSIS — D649 Anemia, unspecified: Secondary | ICD-10-CM | POA: Diagnosis not present

## 2017-03-22 DIAGNOSIS — M545 Low back pain: Secondary | ICD-10-CM | POA: Diagnosis not present

## 2017-03-26 DIAGNOSIS — R262 Difficulty in walking, not elsewhere classified: Secondary | ICD-10-CM | POA: Diagnosis not present

## 2017-03-26 DIAGNOSIS — R5381 Other malaise: Secondary | ICD-10-CM | POA: Diagnosis not present

## 2017-03-26 DIAGNOSIS — M545 Low back pain: Secondary | ICD-10-CM | POA: Diagnosis not present

## 2017-03-26 DIAGNOSIS — M6281 Muscle weakness (generalized): Secondary | ICD-10-CM | POA: Diagnosis not present

## 2017-03-27 DIAGNOSIS — M6281 Muscle weakness (generalized): Secondary | ICD-10-CM | POA: Diagnosis not present

## 2017-03-27 DIAGNOSIS — E860 Dehydration: Secondary | ICD-10-CM | POA: Diagnosis not present

## 2017-03-27 DIAGNOSIS — F339 Major depressive disorder, recurrent, unspecified: Secondary | ICD-10-CM | POA: Diagnosis not present

## 2017-03-27 DIAGNOSIS — Z9889 Other specified postprocedural states: Secondary | ICD-10-CM | POA: Diagnosis not present

## 2017-03-27 DIAGNOSIS — M25552 Pain in left hip: Secondary | ICD-10-CM | POA: Diagnosis not present

## 2017-04-03 DIAGNOSIS — M6281 Muscle weakness (generalized): Secondary | ICD-10-CM | POA: Diagnosis not present

## 2017-04-03 DIAGNOSIS — G4700 Insomnia, unspecified: Secondary | ICD-10-CM | POA: Diagnosis not present

## 2017-04-03 DIAGNOSIS — M48062 Spinal stenosis, lumbar region with neurogenic claudication: Secondary | ICD-10-CM | POA: Diagnosis not present

## 2017-04-03 DIAGNOSIS — M545 Low back pain: Secondary | ICD-10-CM | POA: Diagnosis not present

## 2017-04-03 DIAGNOSIS — R262 Difficulty in walking, not elsewhere classified: Secondary | ICD-10-CM | POA: Diagnosis not present

## 2017-04-03 DIAGNOSIS — G894 Chronic pain syndrome: Secondary | ICD-10-CM | POA: Diagnosis not present

## 2017-04-03 DIAGNOSIS — R5381 Other malaise: Secondary | ICD-10-CM | POA: Diagnosis not present

## 2017-04-03 DIAGNOSIS — Z4789 Encounter for other orthopedic aftercare: Secondary | ICD-10-CM | POA: Diagnosis not present

## 2017-04-04 DIAGNOSIS — L98429 Non-pressure chronic ulcer of back with unspecified severity: Secondary | ICD-10-CM | POA: Diagnosis not present

## 2017-04-05 DIAGNOSIS — R309 Painful micturition, unspecified: Secondary | ICD-10-CM | POA: Diagnosis not present

## 2017-04-05 DIAGNOSIS — F05 Delirium due to known physiological condition: Secondary | ICD-10-CM | POA: Diagnosis not present

## 2017-04-05 DIAGNOSIS — M6281 Muscle weakness (generalized): Secondary | ICD-10-CM | POA: Diagnosis not present

## 2017-04-05 DIAGNOSIS — N390 Urinary tract infection, site not specified: Secondary | ICD-10-CM | POA: Diagnosis not present

## 2017-04-09 DIAGNOSIS — Z981 Arthrodesis status: Secondary | ICD-10-CM | POA: Diagnosis not present

## 2017-04-09 DIAGNOSIS — Z471 Aftercare following joint replacement surgery: Secondary | ICD-10-CM | POA: Diagnosis not present

## 2017-04-10 DIAGNOSIS — R5381 Other malaise: Secondary | ICD-10-CM | POA: Diagnosis not present

## 2017-04-10 DIAGNOSIS — R262 Difficulty in walking, not elsewhere classified: Secondary | ICD-10-CM | POA: Diagnosis not present

## 2017-04-10 DIAGNOSIS — M545 Low back pain: Secondary | ICD-10-CM | POA: Diagnosis not present

## 2017-04-10 DIAGNOSIS — M6281 Muscle weakness (generalized): Secondary | ICD-10-CM | POA: Diagnosis not present

## 2017-04-11 DIAGNOSIS — M6281 Muscle weakness (generalized): Secondary | ICD-10-CM | POA: Diagnosis not present

## 2017-04-11 DIAGNOSIS — R5381 Other malaise: Secondary | ICD-10-CM | POA: Diagnosis not present

## 2017-04-11 DIAGNOSIS — R05 Cough: Secondary | ICD-10-CM | POA: Diagnosis not present

## 2017-04-11 DIAGNOSIS — L98429 Non-pressure chronic ulcer of back with unspecified severity: Secondary | ICD-10-CM | POA: Diagnosis not present

## 2017-04-11 DIAGNOSIS — R0989 Other specified symptoms and signs involving the circulatory and respiratory systems: Secondary | ICD-10-CM | POA: Diagnosis not present

## 2017-04-12 DIAGNOSIS — Z471 Aftercare following joint replacement surgery: Secondary | ICD-10-CM | POA: Diagnosis not present

## 2017-04-12 DIAGNOSIS — Z981 Arthrodesis status: Secondary | ICD-10-CM | POA: Diagnosis not present

## 2017-04-13 DIAGNOSIS — Z981 Arthrodesis status: Secondary | ICD-10-CM | POA: Insufficient documentation

## 2017-04-23 DIAGNOSIS — B999 Unspecified infectious disease: Secondary | ICD-10-CM | POA: Diagnosis not present

## 2017-04-23 DIAGNOSIS — M4320 Fusion of spine, site unspecified: Secondary | ICD-10-CM | POA: Diagnosis not present

## 2017-04-23 DIAGNOSIS — M549 Dorsalgia, unspecified: Secondary | ICD-10-CM | POA: Diagnosis not present

## 2017-04-23 DIAGNOSIS — Z9889 Other specified postprocedural states: Secondary | ICD-10-CM | POA: Diagnosis not present

## 2017-04-25 DIAGNOSIS — R262 Difficulty in walking, not elsewhere classified: Secondary | ICD-10-CM | POA: Diagnosis not present

## 2017-04-25 DIAGNOSIS — M545 Low back pain: Secondary | ICD-10-CM | POA: Diagnosis not present

## 2017-04-25 DIAGNOSIS — M6281 Muscle weakness (generalized): Secondary | ICD-10-CM | POA: Diagnosis not present

## 2017-04-25 DIAGNOSIS — R5381 Other malaise: Secondary | ICD-10-CM | POA: Diagnosis not present

## 2017-04-28 DIAGNOSIS — M4316 Spondylolisthesis, lumbar region: Secondary | ICD-10-CM | POA: Diagnosis not present

## 2017-04-28 DIAGNOSIS — H2513 Age-related nuclear cataract, bilateral: Secondary | ICD-10-CM | POA: Diagnosis not present

## 2017-04-28 DIAGNOSIS — L98429 Non-pressure chronic ulcer of back with unspecified severity: Secondary | ICD-10-CM | POA: Diagnosis not present

## 2017-04-28 DIAGNOSIS — F339 Major depressive disorder, recurrent, unspecified: Secondary | ICD-10-CM | POA: Diagnosis not present

## 2017-04-28 DIAGNOSIS — M47814 Spondylosis without myelopathy or radiculopathy, thoracic region: Secondary | ICD-10-CM | POA: Diagnosis not present

## 2017-04-28 DIAGNOSIS — I10 Essential (primary) hypertension: Secondary | ICD-10-CM | POA: Diagnosis not present

## 2017-04-28 DIAGNOSIS — D649 Anemia, unspecified: Secondary | ICD-10-CM | POA: Diagnosis not present

## 2017-04-28 DIAGNOSIS — H04123 Dry eye syndrome of bilateral lacrimal glands: Secondary | ICD-10-CM | POA: Diagnosis not present

## 2017-04-28 DIAGNOSIS — M069 Rheumatoid arthritis, unspecified: Secondary | ICD-10-CM | POA: Diagnosis not present

## 2017-04-28 DIAGNOSIS — Z4789 Encounter for other orthopedic aftercare: Secondary | ICD-10-CM | POA: Diagnosis not present

## 2017-04-28 DIAGNOSIS — Z981 Arthrodesis status: Secondary | ICD-10-CM | POA: Diagnosis not present

## 2017-04-28 DIAGNOSIS — M48062 Spinal stenosis, lumbar region with neurogenic claudication: Secondary | ICD-10-CM | POA: Diagnosis not present

## 2017-04-28 DIAGNOSIS — I998 Other disorder of circulatory system: Secondary | ICD-10-CM | POA: Diagnosis not present

## 2017-04-28 DIAGNOSIS — N189 Chronic kidney disease, unspecified: Secondary | ICD-10-CM | POA: Diagnosis not present

## 2017-04-28 DIAGNOSIS — M2578 Osteophyte, vertebrae: Secondary | ICD-10-CM | POA: Diagnosis not present

## 2017-04-28 DIAGNOSIS — M8580 Other specified disorders of bone density and structure, unspecified site: Secondary | ICD-10-CM | POA: Diagnosis not present

## 2017-04-28 DIAGNOSIS — G894 Chronic pain syndrome: Secondary | ICD-10-CM | POA: Diagnosis not present

## 2017-04-28 DIAGNOSIS — M6281 Muscle weakness (generalized): Secondary | ICD-10-CM | POA: Diagnosis not present

## 2017-04-28 DIAGNOSIS — N3289 Other specified disorders of bladder: Secondary | ICD-10-CM | POA: Diagnosis not present

## 2017-05-02 DIAGNOSIS — L98429 Non-pressure chronic ulcer of back with unspecified severity: Secondary | ICD-10-CM | POA: Diagnosis not present

## 2017-05-03 DIAGNOSIS — Z4789 Encounter for other orthopedic aftercare: Secondary | ICD-10-CM | POA: Diagnosis not present

## 2017-05-03 DIAGNOSIS — Z981 Arthrodesis status: Secondary | ICD-10-CM | POA: Diagnosis not present

## 2017-05-09 DIAGNOSIS — L98429 Non-pressure chronic ulcer of back with unspecified severity: Secondary | ICD-10-CM | POA: Diagnosis not present

## 2017-05-17 DIAGNOSIS — L98429 Non-pressure chronic ulcer of back with unspecified severity: Secondary | ICD-10-CM | POA: Diagnosis not present

## 2017-05-23 DIAGNOSIS — L98429 Non-pressure chronic ulcer of back with unspecified severity: Secondary | ICD-10-CM | POA: Diagnosis not present

## 2017-05-29 DIAGNOSIS — N189 Chronic kidney disease, unspecified: Secondary | ICD-10-CM | POA: Diagnosis not present

## 2017-05-29 DIAGNOSIS — I10 Essential (primary) hypertension: Secondary | ICD-10-CM | POA: Diagnosis not present

## 2017-05-29 DIAGNOSIS — N3289 Other specified disorders of bladder: Secondary | ICD-10-CM | POA: Diagnosis not present

## 2017-05-29 DIAGNOSIS — M6281 Muscle weakness (generalized): Secondary | ICD-10-CM | POA: Diagnosis not present

## 2017-05-29 DIAGNOSIS — M069 Rheumatoid arthritis, unspecified: Secondary | ICD-10-CM | POA: Diagnosis not present

## 2017-05-29 DIAGNOSIS — G894 Chronic pain syndrome: Secondary | ICD-10-CM | POA: Diagnosis not present

## 2017-05-29 DIAGNOSIS — M48062 Spinal stenosis, lumbar region with neurogenic claudication: Secondary | ICD-10-CM | POA: Diagnosis not present

## 2017-05-29 DIAGNOSIS — F339 Major depressive disorder, recurrent, unspecified: Secondary | ICD-10-CM | POA: Diagnosis not present

## 2017-05-29 DIAGNOSIS — D649 Anemia, unspecified: Secondary | ICD-10-CM | POA: Diagnosis not present

## 2017-05-29 DIAGNOSIS — Z4789 Encounter for other orthopedic aftercare: Secondary | ICD-10-CM | POA: Diagnosis not present

## 2017-06-06 DIAGNOSIS — G894 Chronic pain syndrome: Secondary | ICD-10-CM | POA: Diagnosis not present

## 2017-06-06 DIAGNOSIS — M48062 Spinal stenosis, lumbar region with neurogenic claudication: Secondary | ICD-10-CM | POA: Diagnosis not present

## 2017-06-06 DIAGNOSIS — I10 Essential (primary) hypertension: Secondary | ICD-10-CM | POA: Diagnosis not present

## 2017-06-06 DIAGNOSIS — L98429 Non-pressure chronic ulcer of back with unspecified severity: Secondary | ICD-10-CM | POA: Diagnosis not present

## 2017-06-06 DIAGNOSIS — M069 Rheumatoid arthritis, unspecified: Secondary | ICD-10-CM | POA: Diagnosis not present

## 2017-06-14 DIAGNOSIS — L98429 Non-pressure chronic ulcer of back with unspecified severity: Secondary | ICD-10-CM | POA: Diagnosis not present

## 2017-06-19 ENCOUNTER — Other Ambulatory Visit: Payer: Self-pay | Admitting: *Deleted

## 2017-06-19 NOTE — Patient Outreach (Signed)
[place] [facility]) Care Management  06/19/2017  Dominique Mckee 1930/07/21 [id number]  Met with [name], discharge planner. She reports patient going home with son the end of next week. She states son is getting home ready for patient discharge.   Met with patient at bedside of facility. Patient reports she is looking forward to going home. She states her son is working on getting needed equipment such as a hospital bed and he has built a ramp. She states he has taken a leave of absence from his job to assist with her transition home. She states she will have some assistance 2 days a week, she is unclear if it is home care or private pay at this time.   RNCM reviewed [facility] program. Left [facility] packet and RNCM contact. Patient would like for RNCM to call her son [name], [phone] to review program. Verbal consent obtained. She states son is coordinating discharge plans for her.   Plan to call son later in week to follow up for [facility] care management program.  [name]. Laymond Purser, RN, BSN, [place] [phone]) Business Cell  [phone]) Toll Free Office

## 2017-06-20 DIAGNOSIS — H04123 Dry eye syndrome of bilateral lacrimal glands: Secondary | ICD-10-CM | POA: Diagnosis not present

## 2017-06-20 DIAGNOSIS — L98429 Non-pressure chronic ulcer of back with unspecified severity: Secondary | ICD-10-CM | POA: Diagnosis not present

## 2017-06-20 DIAGNOSIS — H2513 Age-related nuclear cataract, bilateral: Secondary | ICD-10-CM | POA: Diagnosis not present

## 2017-06-21 ENCOUNTER — Other Ambulatory Visit: Payer: Self-pay | Admitting: *Deleted

## 2017-06-21 NOTE — Patient Outreach (Signed)
[place] [facility]) Care Management  06/21/2017  DARIANA GARBETT 26-Sep-1930 [id number]   Call attempted to patient son, Merry Proud. He did not answer, and voicemail was full, unable to leave a message. Plan to attempt another call in the next few days. [name]. Laymond Purser, RN, BSN, [place] [phone]) Business Cell  [phone]) Toll Free Office

## 2017-06-24 ENCOUNTER — Other Ambulatory Visit: Payer: Self-pay | Admitting: *Deleted

## 2017-06-24 NOTE — Patient Outreach (Signed)
[place] [facility]) Care Management  06/24/2017  Dominique Mckee August 07, 1930 [id number]  Call back from patient son Merry Proud.  Merry Proud reports he did get [facility] packet and reviewed. RNCM reviewed [facility] care management services and discussed individualized assessment and plan.  He consents to care management. He just wants the nurse assigned to call before making a home visit. He reports patient to discharge home 06/29/17.   Plan to meet with patient at facility at next on-site facility visit and get consent form if possible and make referral to [facility] care management for transition of care. [name]. Laymond Purser, RN, BSN, [place] [phone]) Business Cell  [phone]) Toll Free Office

## 2017-06-24 NOTE — Patient Outreach (Signed)
[place] [facility]) Care Management  06/24/2017  Dominique Mckee 1930-05-17 [id number]  Call to patient son, Merry Proud. No answer and unable to leave a message as Voicemail box was full.  Plan to meet again with patient at facility and attempt to contact son re: [facility] care management services.  [name]. Laymond Purser, RN, BSN, [place] [phone]) Business Cell  [phone]) Toll Free Office

## 2017-06-26 ENCOUNTER — Other Ambulatory Visit: Payer: Self-pay | Admitting: *Deleted

## 2017-06-26 DIAGNOSIS — M069 Rheumatoid arthritis, unspecified: Secondary | ICD-10-CM

## 2017-06-26 NOTE — Patient Outreach (Signed)
[place] [facility]) Care Management  06/26/2017  BLESSINGS INGLETT [birth year] [id number]   Met with [name], Discharge planner at facility. Patient will discharge 06/29/17. Patient has actually exhausted her days.   Met with patient and Patient reports she has surgeon appointment tomorrow to follow up on her back surgery.  Patient looking forward to going home and feels confident in her son's ability to care for her.  RNCM reviewed [facility] program and discussion with patient son, she confirmed verbal consent for [facility] program. Her son has [facility] packet at home but verbal consent received. RNCM also reviewed that patient would need to have a stay of wellness for 60 days to get another 100 days of Medicare coverage.  Plan to refer to [facility] care management transition of care program.  [name]. Laymond Purser, RN, BSN, [place] [phone]) Business Cell  [phone]) Toll Free Office

## 2017-06-27 DIAGNOSIS — M2578 Osteophyte, vertebrae: Secondary | ICD-10-CM | POA: Diagnosis not present

## 2017-06-27 DIAGNOSIS — I998 Other disorder of circulatory system: Secondary | ICD-10-CM | POA: Diagnosis not present

## 2017-06-27 DIAGNOSIS — M47814 Spondylosis without myelopathy or radiculopathy, thoracic region: Secondary | ICD-10-CM | POA: Diagnosis not present

## 2017-06-27 DIAGNOSIS — M8580 Other specified disorders of bone density and structure, unspecified site: Secondary | ICD-10-CM | POA: Diagnosis not present

## 2017-06-27 DIAGNOSIS — M4316 Spondylolisthesis, lumbar region: Secondary | ICD-10-CM | POA: Diagnosis not present

## 2017-06-27 DIAGNOSIS — Z981 Arthrodesis status: Secondary | ICD-10-CM | POA: Diagnosis not present

## 2017-06-30 DIAGNOSIS — G43009 Migraine without aura, not intractable, without status migrainosus: Secondary | ICD-10-CM | POA: Diagnosis not present

## 2017-06-30 DIAGNOSIS — Z7982 Long term (current) use of aspirin: Secondary | ICD-10-CM | POA: Diagnosis not present

## 2017-06-30 DIAGNOSIS — I5031 Acute diastolic (congestive) heart failure: Secondary | ICD-10-CM | POA: Diagnosis not present

## 2017-06-30 DIAGNOSIS — M4135 Thoracogenic scoliosis, thoracolumbar region: Secondary | ICD-10-CM | POA: Diagnosis not present

## 2017-06-30 DIAGNOSIS — Z981 Arthrodesis status: Secondary | ICD-10-CM | POA: Diagnosis not present

## 2017-06-30 DIAGNOSIS — B353 Tinea pedis: Secondary | ICD-10-CM | POA: Diagnosis not present

## 2017-06-30 DIAGNOSIS — N189 Chronic kidney disease, unspecified: Secondary | ICD-10-CM | POA: Diagnosis not present

## 2017-06-30 DIAGNOSIS — M48062 Spinal stenosis, lumbar region with neurogenic claudication: Secondary | ICD-10-CM | POA: Diagnosis not present

## 2017-06-30 DIAGNOSIS — D631 Anemia in chronic kidney disease: Secondary | ICD-10-CM | POA: Diagnosis not present

## 2017-06-30 DIAGNOSIS — M47812 Spondylosis without myelopathy or radiculopathy, cervical region: Secondary | ICD-10-CM | POA: Diagnosis not present

## 2017-06-30 DIAGNOSIS — M069 Rheumatoid arthritis, unspecified: Secondary | ICD-10-CM | POA: Diagnosis not present

## 2017-06-30 DIAGNOSIS — I130 Hypertensive heart and chronic kidney disease with heart failure and stage 1 through stage 4 chronic kidney disease, or unspecified chronic kidney disease: Secondary | ICD-10-CM | POA: Diagnosis not present

## 2017-06-30 DIAGNOSIS — Z79891 Long term (current) use of opiate analgesic: Secondary | ICD-10-CM | POA: Diagnosis not present

## 2017-06-30 DIAGNOSIS — F339 Major depressive disorder, recurrent, unspecified: Secondary | ICD-10-CM | POA: Diagnosis not present

## 2017-06-30 DIAGNOSIS — Z9181 History of falling: Secondary | ICD-10-CM | POA: Diagnosis not present

## 2017-07-02 ENCOUNTER — Other Ambulatory Visit: Payer: Self-pay | Admitting: *Deleted

## 2017-07-02 NOTE — Patient Outreach (Signed)
[place] [facility]) Care Management  07/02/2017  GOLDY CALANDRA January 05, 1930 [id number]   Reported received pt will discharge on 9/1  Initial transition of care attempt to outreach to pt's son [name] however caregiver's voice box was full and RN unable to leave a message. Will continue outreach calls accordingly for possible [facility] services.  Raina Mina, RN Care Management Coordinator [place] Office [phone]

## 2017-07-03 ENCOUNTER — Encounter: Payer: Self-pay | Admitting: *Deleted

## 2017-07-03 ENCOUNTER — Other Ambulatory Visit: Payer: Self-pay | Admitting: *Deleted

## 2017-07-03 NOTE — Patient Outreach (Signed)
[place] [facility]) Care Management  07/03/2017  Dominique Mckee 10-Sep-1930 [id number]   Transition of care d/c SNF on 9/1  RN spoke with pt's son [name]) as recommended via referral sent on 8/30. Caregiver reports at this time pt is in need of just transportation however specially for someone to come into the home, get pt up and ready. Then transport to the vehicle services to the appointment. RN explained [facility] does not provider this services directly however service local agency can assist with transporting pt to her medical appointments. Other services available to assist with her ADL bathing and dressing would be related to possible nursing aide services to a local agency of choice. RN explained there maybe a fee for services however the resources can be provided and caregiver can consult with this agency based upon his request for specific services. Son indicated pt currently involved with a local [place] agency for ongoing PT due to pt remaining decondition since her discharged from the facility with her mobility. RN also encouraged caregiver to inquired further with the involved [place] agency for local agencies to assist with transportation however also offered to sent pt this information due to the declined for community with home visits at this time. Son declined and states he is aware of most of the information RN has provided. RN also inquired on transition of care information and weekly calls however son indicated no needs or then possible transportation and declined any additional contacts related to transition of care. RN has informed caregiver if any other needs arise or needed resources to outreach to the [name] services or this RN directly in the further for such resources. Caregiver appreciative and the call ended. Will close the pending case for [facility] services at this time.   Raina Mina, RN Care Management Coordinator [place] Office [phone]

## 2017-07-04 DIAGNOSIS — G43009 Migraine without aura, not intractable, without status migrainosus: Secondary | ICD-10-CM | POA: Diagnosis not present

## 2017-07-04 DIAGNOSIS — Z981 Arthrodesis status: Secondary | ICD-10-CM | POA: Diagnosis not present

## 2017-07-04 DIAGNOSIS — N189 Chronic kidney disease, unspecified: Secondary | ICD-10-CM | POA: Diagnosis not present

## 2017-07-04 DIAGNOSIS — Z7982 Long term (current) use of aspirin: Secondary | ICD-10-CM | POA: Diagnosis not present

## 2017-07-04 DIAGNOSIS — I5031 Acute diastolic (congestive) heart failure: Secondary | ICD-10-CM | POA: Diagnosis not present

## 2017-07-04 DIAGNOSIS — M4135 Thoracogenic scoliosis, thoracolumbar region: Secondary | ICD-10-CM | POA: Diagnosis not present

## 2017-07-04 DIAGNOSIS — I130 Hypertensive heart and chronic kidney disease with heart failure and stage 1 through stage 4 chronic kidney disease, or unspecified chronic kidney disease: Secondary | ICD-10-CM | POA: Diagnosis not present

## 2017-07-04 DIAGNOSIS — Z79891 Long term (current) use of opiate analgesic: Secondary | ICD-10-CM | POA: Diagnosis not present

## 2017-07-04 DIAGNOSIS — M069 Rheumatoid arthritis, unspecified: Secondary | ICD-10-CM | POA: Diagnosis not present

## 2017-07-04 DIAGNOSIS — M47812 Spondylosis without myelopathy or radiculopathy, cervical region: Secondary | ICD-10-CM | POA: Diagnosis not present

## 2017-07-04 DIAGNOSIS — Z9181 History of falling: Secondary | ICD-10-CM | POA: Diagnosis not present

## 2017-07-04 DIAGNOSIS — B353 Tinea pedis: Secondary | ICD-10-CM | POA: Diagnosis not present

## 2017-07-04 DIAGNOSIS — M48062 Spinal stenosis, lumbar region with neurogenic claudication: Secondary | ICD-10-CM | POA: Diagnosis not present

## 2017-07-04 DIAGNOSIS — F339 Major depressive disorder, recurrent, unspecified: Secondary | ICD-10-CM | POA: Diagnosis not present

## 2017-07-04 DIAGNOSIS — D631 Anemia in chronic kidney disease: Secondary | ICD-10-CM | POA: Diagnosis not present

## 2017-07-08 DIAGNOSIS — Z9181 History of falling: Secondary | ICD-10-CM | POA: Diagnosis not present

## 2017-07-08 DIAGNOSIS — I5031 Acute diastolic (congestive) heart failure: Secondary | ICD-10-CM | POA: Diagnosis not present

## 2017-07-08 DIAGNOSIS — Z981 Arthrodesis status: Secondary | ICD-10-CM | POA: Diagnosis not present

## 2017-07-08 DIAGNOSIS — Z7982 Long term (current) use of aspirin: Secondary | ICD-10-CM | POA: Diagnosis not present

## 2017-07-08 DIAGNOSIS — D631 Anemia in chronic kidney disease: Secondary | ICD-10-CM | POA: Diagnosis not present

## 2017-07-08 DIAGNOSIS — N189 Chronic kidney disease, unspecified: Secondary | ICD-10-CM | POA: Diagnosis not present

## 2017-07-08 DIAGNOSIS — M48062 Spinal stenosis, lumbar region with neurogenic claudication: Secondary | ICD-10-CM | POA: Diagnosis not present

## 2017-07-08 DIAGNOSIS — B353 Tinea pedis: Secondary | ICD-10-CM | POA: Diagnosis not present

## 2017-07-08 DIAGNOSIS — G43009 Migraine without aura, not intractable, without status migrainosus: Secondary | ICD-10-CM | POA: Diagnosis not present

## 2017-07-08 DIAGNOSIS — M47812 Spondylosis without myelopathy or radiculopathy, cervical region: Secondary | ICD-10-CM | POA: Diagnosis not present

## 2017-07-08 DIAGNOSIS — I130 Hypertensive heart and chronic kidney disease with heart failure and stage 1 through stage 4 chronic kidney disease, or unspecified chronic kidney disease: Secondary | ICD-10-CM | POA: Diagnosis not present

## 2017-07-08 DIAGNOSIS — F339 Major depressive disorder, recurrent, unspecified: Secondary | ICD-10-CM | POA: Diagnosis not present

## 2017-07-08 DIAGNOSIS — M4135 Thoracogenic scoliosis, thoracolumbar region: Secondary | ICD-10-CM | POA: Diagnosis not present

## 2017-07-08 DIAGNOSIS — Z79891 Long term (current) use of opiate analgesic: Secondary | ICD-10-CM | POA: Diagnosis not present

## 2017-07-08 DIAGNOSIS — M069 Rheumatoid arthritis, unspecified: Secondary | ICD-10-CM | POA: Diagnosis not present

## 2017-07-15 DIAGNOSIS — M4135 Thoracogenic scoliosis, thoracolumbar region: Secondary | ICD-10-CM | POA: Diagnosis not present

## 2017-07-15 DIAGNOSIS — Z7982 Long term (current) use of aspirin: Secondary | ICD-10-CM | POA: Diagnosis not present

## 2017-07-15 DIAGNOSIS — M47812 Spondylosis without myelopathy or radiculopathy, cervical region: Secondary | ICD-10-CM | POA: Diagnosis not present

## 2017-07-15 DIAGNOSIS — Z9181 History of falling: Secondary | ICD-10-CM | POA: Diagnosis not present

## 2017-07-15 DIAGNOSIS — M069 Rheumatoid arthritis, unspecified: Secondary | ICD-10-CM | POA: Diagnosis not present

## 2017-07-15 DIAGNOSIS — M48062 Spinal stenosis, lumbar region with neurogenic claudication: Secondary | ICD-10-CM | POA: Diagnosis not present

## 2017-07-15 DIAGNOSIS — I130 Hypertensive heart and chronic kidney disease with heart failure and stage 1 through stage 4 chronic kidney disease, or unspecified chronic kidney disease: Secondary | ICD-10-CM | POA: Diagnosis not present

## 2017-07-15 DIAGNOSIS — D631 Anemia in chronic kidney disease: Secondary | ICD-10-CM | POA: Diagnosis not present

## 2017-07-15 DIAGNOSIS — B353 Tinea pedis: Secondary | ICD-10-CM | POA: Diagnosis not present

## 2017-07-15 DIAGNOSIS — I5031 Acute diastolic (congestive) heart failure: Secondary | ICD-10-CM | POA: Diagnosis not present

## 2017-07-15 DIAGNOSIS — N189 Chronic kidney disease, unspecified: Secondary | ICD-10-CM | POA: Diagnosis not present

## 2017-07-15 DIAGNOSIS — F339 Major depressive disorder, recurrent, unspecified: Secondary | ICD-10-CM | POA: Diagnosis not present

## 2017-07-15 DIAGNOSIS — Z79891 Long term (current) use of opiate analgesic: Secondary | ICD-10-CM | POA: Diagnosis not present

## 2017-07-15 DIAGNOSIS — Z981 Arthrodesis status: Secondary | ICD-10-CM | POA: Diagnosis not present

## 2017-07-15 DIAGNOSIS — G43009 Migraine without aura, not intractable, without status migrainosus: Secondary | ICD-10-CM | POA: Diagnosis not present

## 2017-07-23 DIAGNOSIS — I130 Hypertensive heart and chronic kidney disease with heart failure and stage 1 through stage 4 chronic kidney disease, or unspecified chronic kidney disease: Secondary | ICD-10-CM | POA: Diagnosis not present

## 2017-07-23 DIAGNOSIS — B353 Tinea pedis: Secondary | ICD-10-CM | POA: Diagnosis not present

## 2017-07-23 DIAGNOSIS — M47812 Spondylosis without myelopathy or radiculopathy, cervical region: Secondary | ICD-10-CM | POA: Diagnosis not present

## 2017-07-23 DIAGNOSIS — M48062 Spinal stenosis, lumbar region with neurogenic claudication: Secondary | ICD-10-CM | POA: Diagnosis not present

## 2017-07-23 DIAGNOSIS — G43009 Migraine without aura, not intractable, without status migrainosus: Secondary | ICD-10-CM | POA: Diagnosis not present

## 2017-07-23 DIAGNOSIS — Z79891 Long term (current) use of opiate analgesic: Secondary | ICD-10-CM | POA: Diagnosis not present

## 2017-07-23 DIAGNOSIS — M4135 Thoracogenic scoliosis, thoracolumbar region: Secondary | ICD-10-CM | POA: Diagnosis not present

## 2017-07-23 DIAGNOSIS — D631 Anemia in chronic kidney disease: Secondary | ICD-10-CM | POA: Diagnosis not present

## 2017-07-23 DIAGNOSIS — Z981 Arthrodesis status: Secondary | ICD-10-CM | POA: Diagnosis not present

## 2017-07-23 DIAGNOSIS — F339 Major depressive disorder, recurrent, unspecified: Secondary | ICD-10-CM | POA: Diagnosis not present

## 2017-07-23 DIAGNOSIS — I5031 Acute diastolic (congestive) heart failure: Secondary | ICD-10-CM | POA: Diagnosis not present

## 2017-07-23 DIAGNOSIS — N189 Chronic kidney disease, unspecified: Secondary | ICD-10-CM | POA: Diagnosis not present

## 2017-07-23 DIAGNOSIS — M069 Rheumatoid arthritis, unspecified: Secondary | ICD-10-CM | POA: Diagnosis not present

## 2017-07-28 DIAGNOSIS — N3289 Other specified disorders of bladder: Secondary | ICD-10-CM | POA: Diagnosis not present

## 2017-07-28 DIAGNOSIS — G894 Chronic pain syndrome: Secondary | ICD-10-CM | POA: Diagnosis not present

## 2017-07-28 DIAGNOSIS — M48062 Spinal stenosis, lumbar region with neurogenic claudication: Secondary | ICD-10-CM | POA: Diagnosis not present

## 2017-07-28 DIAGNOSIS — Z4789 Encounter for other orthopedic aftercare: Secondary | ICD-10-CM | POA: Diagnosis not present

## 2017-07-28 DIAGNOSIS — D649 Anemia, unspecified: Secondary | ICD-10-CM | POA: Diagnosis not present

## 2017-07-28 DIAGNOSIS — M6281 Muscle weakness (generalized): Secondary | ICD-10-CM | POA: Diagnosis not present

## 2017-07-29 DIAGNOSIS — K219 Gastro-esophageal reflux disease without esophagitis: Secondary | ICD-10-CM | POA: Diagnosis not present

## 2017-07-29 DIAGNOSIS — N183 Chronic kidney disease, stage 3 (moderate): Secondary | ICD-10-CM | POA: Diagnosis not present

## 2017-07-29 DIAGNOSIS — Z23 Encounter for immunization: Secondary | ICD-10-CM | POA: Diagnosis not present

## 2017-07-29 DIAGNOSIS — M6281 Muscle weakness (generalized): Secondary | ICD-10-CM | POA: Diagnosis not present

## 2017-07-29 DIAGNOSIS — M519 Unspecified thoracic, thoracolumbar and lumbosacral intervertebral disc disorder: Secondary | ICD-10-CM | POA: Diagnosis not present

## 2017-07-29 DIAGNOSIS — F329 Major depressive disorder, single episode, unspecified: Secondary | ICD-10-CM | POA: Diagnosis not present

## 2017-07-29 DIAGNOSIS — I10 Essential (primary) hypertension: Secondary | ICD-10-CM | POA: Diagnosis not present

## 2017-07-29 DIAGNOSIS — R350 Frequency of micturition: Secondary | ICD-10-CM | POA: Diagnosis not present

## 2017-07-29 DIAGNOSIS — M069 Rheumatoid arthritis, unspecified: Secondary | ICD-10-CM | POA: Diagnosis not present

## 2017-07-29 DIAGNOSIS — Z1389 Encounter for screening for other disorder: Secondary | ICD-10-CM | POA: Diagnosis not present

## 2017-07-30 DIAGNOSIS — M069 Rheumatoid arthritis, unspecified: Secondary | ICD-10-CM | POA: Diagnosis not present

## 2017-07-30 DIAGNOSIS — B353 Tinea pedis: Secondary | ICD-10-CM | POA: Diagnosis not present

## 2017-07-30 DIAGNOSIS — Z981 Arthrodesis status: Secondary | ICD-10-CM | POA: Diagnosis not present

## 2017-07-30 DIAGNOSIS — I5031 Acute diastolic (congestive) heart failure: Secondary | ICD-10-CM | POA: Diagnosis not present

## 2017-07-30 DIAGNOSIS — M47812 Spondylosis without myelopathy or radiculopathy, cervical region: Secondary | ICD-10-CM | POA: Diagnosis not present

## 2017-07-30 DIAGNOSIS — I130 Hypertensive heart and chronic kidney disease with heart failure and stage 1 through stage 4 chronic kidney disease, or unspecified chronic kidney disease: Secondary | ICD-10-CM | POA: Diagnosis not present

## 2017-07-30 DIAGNOSIS — F339 Major depressive disorder, recurrent, unspecified: Secondary | ICD-10-CM | POA: Diagnosis not present

## 2017-07-30 DIAGNOSIS — D631 Anemia in chronic kidney disease: Secondary | ICD-10-CM | POA: Diagnosis not present

## 2017-07-30 DIAGNOSIS — M4135 Thoracogenic scoliosis, thoracolumbar region: Secondary | ICD-10-CM | POA: Diagnosis not present

## 2017-07-30 DIAGNOSIS — G43009 Migraine without aura, not intractable, without status migrainosus: Secondary | ICD-10-CM | POA: Diagnosis not present

## 2017-07-30 DIAGNOSIS — N189 Chronic kidney disease, unspecified: Secondary | ICD-10-CM | POA: Diagnosis not present

## 2017-07-30 DIAGNOSIS — Z79891 Long term (current) use of opiate analgesic: Secondary | ICD-10-CM | POA: Diagnosis not present

## 2017-07-30 DIAGNOSIS — M48062 Spinal stenosis, lumbar region with neurogenic claudication: Secondary | ICD-10-CM | POA: Diagnosis not present

## 2017-08-06 DIAGNOSIS — D631 Anemia in chronic kidney disease: Secondary | ICD-10-CM | POA: Diagnosis not present

## 2017-08-06 DIAGNOSIS — Z79891 Long term (current) use of opiate analgesic: Secondary | ICD-10-CM | POA: Diagnosis not present

## 2017-08-06 DIAGNOSIS — M069 Rheumatoid arthritis, unspecified: Secondary | ICD-10-CM | POA: Diagnosis not present

## 2017-08-06 DIAGNOSIS — M48062 Spinal stenosis, lumbar region with neurogenic claudication: Secondary | ICD-10-CM | POA: Diagnosis not present

## 2017-08-06 DIAGNOSIS — M4135 Thoracogenic scoliosis, thoracolumbar region: Secondary | ICD-10-CM | POA: Diagnosis not present

## 2017-08-06 DIAGNOSIS — Z981 Arthrodesis status: Secondary | ICD-10-CM | POA: Diagnosis not present

## 2017-08-06 DIAGNOSIS — N189 Chronic kidney disease, unspecified: Secondary | ICD-10-CM | POA: Diagnosis not present

## 2017-08-06 DIAGNOSIS — M47812 Spondylosis without myelopathy or radiculopathy, cervical region: Secondary | ICD-10-CM | POA: Diagnosis not present

## 2017-08-06 DIAGNOSIS — G43009 Migraine without aura, not intractable, without status migrainosus: Secondary | ICD-10-CM | POA: Diagnosis not present

## 2017-08-06 DIAGNOSIS — I5031 Acute diastolic (congestive) heart failure: Secondary | ICD-10-CM | POA: Diagnosis not present

## 2017-08-06 DIAGNOSIS — B353 Tinea pedis: Secondary | ICD-10-CM | POA: Diagnosis not present

## 2017-08-06 DIAGNOSIS — I130 Hypertensive heart and chronic kidney disease with heart failure and stage 1 through stage 4 chronic kidney disease, or unspecified chronic kidney disease: Secondary | ICD-10-CM | POA: Diagnosis not present

## 2017-08-06 DIAGNOSIS — F339 Major depressive disorder, recurrent, unspecified: Secondary | ICD-10-CM | POA: Diagnosis not present

## 2017-08-09 DIAGNOSIS — Z6835 Body mass index (BMI) 35.0-35.9, adult: Secondary | ICD-10-CM | POA: Diagnosis not present

## 2017-08-09 DIAGNOSIS — M545 Low back pain: Secondary | ICD-10-CM | POA: Diagnosis not present

## 2017-08-09 DIAGNOSIS — M47896 Other spondylosis, lumbar region: Secondary | ICD-10-CM | POA: Diagnosis not present

## 2017-08-09 DIAGNOSIS — Z79891 Long term (current) use of opiate analgesic: Secondary | ICD-10-CM | POA: Diagnosis not present

## 2017-08-09 DIAGNOSIS — Z79899 Other long term (current) drug therapy: Secondary | ICD-10-CM | POA: Diagnosis not present

## 2017-08-09 DIAGNOSIS — G894 Chronic pain syndrome: Secondary | ICD-10-CM | POA: Diagnosis not present

## 2017-08-12 ENCOUNTER — Emergency Department (HOSPITAL_COMMUNITY): Payer: PPO

## 2017-08-12 ENCOUNTER — Encounter (HOSPITAL_COMMUNITY): Payer: Self-pay | Admitting: *Deleted

## 2017-08-12 ENCOUNTER — Emergency Department (HOSPITAL_COMMUNITY)
Admission: EM | Admit: 2017-08-12 | Discharge: 2017-08-12 | Disposition: A | Discharge status: Home / Self Care | Payer: PPO | Attending: Emergency Medicine | Admitting: Emergency Medicine

## 2017-08-12 DIAGNOSIS — I130 Hypertensive heart and chronic kidney disease with heart failure and stage 1 through stage 4 chronic kidney disease, or unspecified chronic kidney disease: Secondary | ICD-10-CM | POA: Insufficient documentation

## 2017-08-12 DIAGNOSIS — W010XXA Fall on same level from slipping, tripping and stumbling without subsequent striking against object, initial encounter: Secondary | ICD-10-CM | POA: Insufficient documentation

## 2017-08-12 DIAGNOSIS — S199XXA Unspecified injury of neck, initial encounter: Secondary | ICD-10-CM | POA: Diagnosis not present

## 2017-08-12 DIAGNOSIS — Z79899 Other long term (current) drug therapy: Secondary | ICD-10-CM | POA: Insufficient documentation

## 2017-08-12 DIAGNOSIS — S43015A Anterior dislocation of left humerus, initial encounter: Secondary | ICD-10-CM | POA: Insufficient documentation

## 2017-08-12 DIAGNOSIS — Y998 Other external cause status: Secondary | ICD-10-CM | POA: Diagnosis not present

## 2017-08-12 DIAGNOSIS — T148XXA Other injury of unspecified body region, initial encounter: Secondary | ICD-10-CM | POA: Diagnosis not present

## 2017-08-12 DIAGNOSIS — M25532 Pain in left wrist: Secondary | ICD-10-CM | POA: Diagnosis not present

## 2017-08-12 DIAGNOSIS — M25522 Pain in left elbow: Secondary | ICD-10-CM | POA: Diagnosis not present

## 2017-08-12 DIAGNOSIS — M25512 Pain in left shoulder: Secondary | ICD-10-CM | POA: Diagnosis not present

## 2017-08-12 DIAGNOSIS — S43085D Other dislocation of left shoulder joint, subsequent encounter: Secondary | ICD-10-CM | POA: Diagnosis not present

## 2017-08-12 DIAGNOSIS — S43005A Unspecified dislocation of left shoulder joint, initial encounter: Secondary | ICD-10-CM

## 2017-08-12 DIAGNOSIS — S0990XA Unspecified injury of head, initial encounter: Secondary | ICD-10-CM | POA: Insufficient documentation

## 2017-08-12 DIAGNOSIS — S6992XA Unspecified injury of left wrist, hand and finger(s), initial encounter: Secondary | ICD-10-CM | POA: Diagnosis not present

## 2017-08-12 DIAGNOSIS — S8992XA Unspecified injury of left lower leg, initial encounter: Secondary | ICD-10-CM | POA: Diagnosis not present

## 2017-08-12 DIAGNOSIS — M79605 Pain in left leg: Secondary | ICD-10-CM | POA: Diagnosis not present

## 2017-08-12 DIAGNOSIS — Y92009 Unspecified place in unspecified non-institutional (private) residence as the place of occurrence of the external cause: Secondary | ICD-10-CM | POA: Diagnosis not present

## 2017-08-12 DIAGNOSIS — Y9301 Activity, walking, marching and hiking: Secondary | ICD-10-CM | POA: Insufficient documentation

## 2017-08-12 DIAGNOSIS — S43002A Unspecified subluxation of left shoulder joint, initial encounter: Secondary | ICD-10-CM | POA: Diagnosis not present

## 2017-08-12 DIAGNOSIS — M25552 Pain in left hip: Secondary | ICD-10-CM | POA: Diagnosis not present

## 2017-08-12 DIAGNOSIS — N189 Chronic kidney disease, unspecified: Secondary | ICD-10-CM | POA: Insufficient documentation

## 2017-08-12 DIAGNOSIS — S99912A Unspecified injury of left ankle, initial encounter: Secondary | ICD-10-CM | POA: Diagnosis not present

## 2017-08-12 DIAGNOSIS — S4992XA Unspecified injury of left shoulder and upper arm, initial encounter: Secondary | ICD-10-CM | POA: Diagnosis present

## 2017-08-12 DIAGNOSIS — S79912A Unspecified injury of left hip, initial encounter: Secondary | ICD-10-CM | POA: Diagnosis not present

## 2017-08-12 DIAGNOSIS — I5032 Chronic diastolic (congestive) heart failure: Secondary | ICD-10-CM | POA: Diagnosis not present

## 2017-08-12 DIAGNOSIS — S59902A Unspecified injury of left elbow, initial encounter: Secondary | ICD-10-CM | POA: Diagnosis not present

## 2017-08-12 MED ORDER — SODIUM CHLORIDE 0.9 % IV BOLUS (SEPSIS)
500.0000 mL | Freq: Once | INTRAVENOUS | Status: AC
  Administered 2017-08-12: 500 mL via INTRAVENOUS

## 2017-08-12 MED ORDER — TRAMADOL HCL 50 MG PO TABS: 50.0000 mg | ORAL_TABLET | Freq: Four times a day (QID) | ORAL | 0 refills | Status: DC | PRN

## 2017-08-12 MED ORDER — FENTANYL CITRATE (PF) 100 MCG/2ML IJ SOLN
50.0000 ug | Freq: Once | INTRAMUSCULAR | Status: AC
  Administered 2017-08-12: 50 ug via INTRAVENOUS
  Filled 2017-08-12: qty 2

## 2017-08-12 MED ORDER — FENTANYL CITRATE (PF) 100 MCG/2ML IJ SOLN
50.0000 ug | Freq: Once | INTRAMUSCULAR | Status: DC
  Filled 2017-08-12: qty 2

## 2017-08-12 MED ORDER — BUPIVACAINE HCL (PF) 0.5 % IJ SOLN
20.0000 mL | Freq: Once | INTRAMUSCULAR | Status: AC
  Administered 2017-08-12: 20 mL
  Filled 2017-08-12: qty 30

## 2017-08-12 MED ORDER — PROPOFOL 10 MG/ML IV BOLUS
0.5000 mg/kg | Freq: Once | INTRAVENOUS | Status: AC
  Administered 2017-08-12: 30 mg via INTRAVENOUS
  Filled 2017-08-12: qty 20

## 2017-08-12 MED ORDER — PROPOFOL 10 MG/ML IV BOLUS
1.0000 mg/kg | Freq: Once | INTRAVENOUS | Status: AC
  Administered 2017-08-12: 60 mg via INTRAVENOUS
  Filled 2017-08-12: qty 20

## 2017-08-12 NOTE — ED Notes (Signed)
Ronalee Belts RN is at bedside to administer medications and perform a second conscious sedation.

## 2017-08-12 NOTE — ED Notes (Signed)
Radiology at bedside

## 2017-08-12 NOTE — Discharge Instructions (Signed)
Your shoulder pain after fall was due to a dislocated shoulder. We successfully relocated her shoulder. Continue using her arm sling and follow-up with your orthopedist, Dr. Durward Fortes with [facility] orthopedics for prompt reevaluation. You may take your home pain medicines as prescribed to help with your discomfort if necessary. Be very careful as this can cause you to feel very drowsy. Return to ED for any new or worsening symptoms as we discussed, including, but not limited to: New-onset of pain, unusual arm swelling or redness, numbness or weakness.

## 2017-08-12 NOTE — ED Notes (Signed)
Patient was provided a [place] sandwich, coke, and cheese.

## 2017-08-12 NOTE — ED Notes (Signed)
[facility] picked up patient and took her home.

## 2017-08-12 NOTE — ED Triage Notes (Signed)
Patient is alert and oriented x4.  She is being seen for a fall at home by tripping over her walker while going back to bed from the bathroom.  Patient is complaining of left shoulder.  No deformity noted.  Patient denies any LOC.  Currently she rates her pain 8 of 10.

## 2017-08-12 NOTE — ED Notes (Signed)
Bed: UW72 Expected date:  Expected time:  Means of arrival:  Comments: EMS-fall/shoulder pain

## 2017-08-12 NOTE — ED Notes (Signed)
Spoke with [facility] for transportation for patient to her residents.

## 2017-08-12 NOTE — ED Provider Notes (Signed)
[facility] DEPT Provider Note   CSN: [id number] Arrival date & time: 08/12/17  0917     History   Chief Complaint Chief Complaint  Patient presents with  . Fall    HPI Dominique Mckee is a 83 y.o. female.  HPIHere for evaluation after a fall. Patient reports she was walking around her house this morning with her walker when her left knee gave out. She reports this happens to her routinely. She reports falling forward on her left side. She reports shoulder pain, left hip, knee and left ankle pain. Caregiver at bedside reports she gave half a 5 mg oxycodone, which has not provided significant relief. She denies any numbness, tingling or weakness. No chest pain, shortness of breath, neck pain, head injury, headache, vision changes.  Past Medical History:  Diagnosis Date  . Arthritis   . Benign essential tremor   . Cervical spondylosis   . Cervicogenic headache   . Chronic renal insufficiency   . Degenerative disc disease   . Depression   . Fibromyalgia   . Gait disorder   . GERD (gastroesophageal reflux disease)   . Hypertension   . Lumbar spondylosis   . Migraine headache   . Mild obesity   . Osteopenia   . Rheumatoid arthritis [facility])     Patient Active Problem List   Diagnosis Date Noted  . Acute diastolic congestive heart failure ([place]) 07/08/2015  . Acute respiratory failure with hypoxia ([place]) 07/08/2015  . Shoulder dislocation, recurrent 07/06/2015  . HTN (hypertension) 07/06/2015  . Rheumatoid arthritis ([place]) 07/06/2015  . GERD (gastroesophageal reflux disease) 07/06/2015  . Shoulder dislocation   . Concussion with no loss of consciousness 12/30/2013  . Cervical spondylosis without myelopathy 01/01/2013  . Migraine without aura 01/01/2013  . Abnormality of gait 01/01/2013  . Essential and other specified forms of tremor 01/01/2013    Past Surgical History:  Procedure Laterality Date  . ABDOMINAL HYSTERECTOMY    . APPENDECTOMY    . CHOLECYSTECTOMY    .  ELBOW SURGERY Left   . HAMMER TOE SURGERY Left   . SHOULDER CLOSED REDUCTION Right 07/08/2015   Procedure: RIGHT SHOULDER CLOSED REDUCTION;  Surgeon: Meredith Pel, MD;  Location: [facility];  Service: Orthopedics;  Laterality: Right;  . TONSILLECTOMY    . TOTAL KNEE ARTHROPLASTY Bilateral    right    OB History    No data available       Home Medications    Prior to Admission medications   Medication Sig Start Date End Date Taking? Authorizing Provider  acetaminophen (TYLENOL) 500 MG tablet Take 500 mg by mouth every 6 (six) hours as needed for mild pain.   Yes [provider]  Calcium Carb-Cholecalciferol (CALCIUM 600+D3) 600-200 MG-UNIT TABS Take 1 tablet by mouth daily.   Yes [provider]  diclofenac sodium (VOLTAREN) 1 % GEL Apply 1 application topically 2 (two) times daily as needed (pain).    Yes [provider]  DULoxetine (CYMBALTA) 30 MG capsule Take 60 mg by mouth 2 (two) times daily.  05/14/13  Yes [provider]  ferrous sulfate 325 (65 FE) MG tablet Take 325 mg by mouth every evening.   Yes [provider]  folic acid (FOLVITE) 1 MG tablet Take 3 mg by mouth daily.  12/29/13  Yes [provider]  methotrexate 50 MG/2ML injection Inject 0.7 mLs into the muscle every 7 (seven) days. 06/09/15  Yes [provider]  oxyCODONE (ROXICODONE) 5  MG immediate release tablet Take 5 mg by mouth every 6 (six) hours as needed for severe pain.   Yes [provider]  pantoprazole (PROTONIX) 40 MG tablet Take 40 mg by mouth daily. 12/23/13  Yes [provider]  propranolol (INDERAL) 20 MG tablet Take 1 tablet (20 mg total) by mouth 2 (two) times daily. 07/10/15  Yes Short, Noah Delaine, MD  fentaNYL (DURAGESIC - DOSED MCG/HR) 25 MCG/HR patch Place 1 patch (25 mcg total) onto the skin every 3 (three) days. Patient not taking: Reported on 08/12/2017 07/10/15   Janece Canterbury, MD  gabapentin (NEURONTIN) 100 MG capsule  take 1 to 2 capsules by mouth three times a day if needed Patient not taking: Reported on 08/12/2017 09/18/16   Gerda Diss, DO  Oxycodone HCl 10 MG TABS Take 1 tablet (10 mg total) by mouth 3 (three) times daily. Patient not taking: Reported on 08/12/2017 07/10/15   Janece Canterbury, MD  polyethylene glycol powder (MIRALAX) powder Take 17 g by mouth daily. Patient not taking: Reported on 08/12/2017 07/10/15   Janece Canterbury, MD  senna (SENOKOT) 8.6 MG TABS tablet Take 2 tablets (17.2 mg total) by mouth at bedtime. Patient not taking: Reported on 08/12/2017 07/10/15   Janece Canterbury, MD    Family History Family History  Problem Relation Age of Onset  . Osteoporosis Mother   . Intracerebral hemorrhage Mother 43  . AAA (abdominal aortic aneurysm) Father 30  . Lung cancer Brother   . Diabetes Brother   . Lung cancer Brother   . Diabetes Sister   . Lung cancer Sister   . Bone cancer Sister   . Kidney failure Son        left kidney transplant  . Diabetes type I Daughter   . Anesthesia problems Neg Hx   . Hypertension Neg Hx     Social History Social History  Substance Use Topics  . Smoking status: Never Smoker  . Smokeless tobacco: Never Used  . Alcohol use No     Allergies   Feldene [piroxicam]; Clinoril [sulindac]; and Morphine and related   Review of Systems Review of Systems See history of present illness  Physical Exam Updated Vital Signs BP (!) 182/96 (BP Location: Right Arm)   Pulse 74   Temp 98 F (36.7 C) (Oral)   Resp 17   Ht 5\' 3"  (1.6 m)   Wt 63.5 kg (140 lb)   SpO2 99%   BMI 24.80 kg/m   Physical Exam  Constitutional: She appears well-developed. No distress.  Awake, alert and nontoxic in appearance  HENT:  Head: Normocephalic and atraumatic.  Right Ear: External ear normal.  Left Ear: External ear normal.  Mouth/Throat: Oropharynx is clear and moist.  Eyes: Pupils are equal, round, and reactive to light. Conjunctivae and EOM are normal.   Neck: Normal range of motion. No JVD present.  Cardiovascular: Normal rate and regular rhythm.   Pulmonary/Chest: Effort normal and breath sounds normal. No stridor.  Abdominal: Soft. There is no tenderness.  Musculoskeletal:  Left upper extremity held in flexion. Tenderness diffusely about left shoulder. Low riding humeral head. Radial pulse 2+, grip strength intact, sensation intact to light touch Full active range of motion of left lower extremity. Tender to palpate at lateral left ankle, lateral left knee and lateral left hip. No crepitus or other abnormal findings.  Neurological:  Awake, alert, cooperative and aware of situation; motor strength bilaterally; sensation normal to light touch bilaterally; no facial asymmetry; tongue  midline; major cranial nerves appear intact;  baseline gait without new ataxia.  Skin: No rash noted. She is not diaphoretic.  Psychiatric: She has a normal mood and affect. Her behavior is normal. Thought content normal.  Nursing note and vitals reviewed.    ED Treatments / Results  Labs (all labs ordered are listed, but only abnormal results are displayed) Labs Reviewed - No data to display  EKG  EKG Interpretation None       Radiology Dg Elbow 2 Views Left  Result Date: 08/12/2017 CLINICAL DATA:  Left elbow pain after a fall this morning. EXAM: LEFT ELBOW - 2 VIEW COMPARISON:  None in PACs FINDINGS: This two-view series is quite limited. I cannot exclude an impacted fracture of the radial head. The olecranon is grossly intact. The condylar and supracondylar regions of the humerus are grossly normal. There is no definite joint effusion. IMPRESSION: Very limited study. Possible impacted fracture of the radial head. Definite degenerative change of the olecranon. A complete elbow series with appropriate positioning is recommended. Electronically Signed   By: David  Martinique M.D.   On: 08/12/2017 13:02   Dg Wrist Complete Left  Result Date:  08/12/2017 CLINICAL DATA:  Generalized arm pain since falling this morning. EXAM: LEFT WRIST - COMPLETE 3+ VIEW COMPARISON:  None in PACs FINDINGS: The bones are subjectively osteopenic. There is moderate degenerative narrowing of the first [facility] joint. No acute carpal or metacarpal fracture is observed. The distal radius and ulna are intact. The radial styloid is small or has been resorptive. There is mild diffuse soft tissue swelling. IMPRESSION: No acute fracture or dislocation of the bones of the left wrist is observed. There is moderate degenerative change of the first [facility] joint. Electronically Signed   By: David  Martinique M.D.   On: 08/12/2017 13:04   Dg Ankle Complete Left  Result Date: 08/12/2017 CLINICAL DATA:  Fall at home with left leg pain.  Initial encounter. EXAM: LEFT ANKLE COMPLETE - 3+ VIEW COMPARISON:  None. FINDINGS: There is chronic fragmentation with cortication at the tip of the lateral malleolus. Lucent appearance at the level of the anterior talar dome on the lateral projection. No dislocation. Postoperative first metatarsal joint. There is hallux valgus and presumed cross toe deformity. Prominent osteopenia.  Arterial calcification. IMPRESSION: 1. Lucency at the anterior talar done in the lateral projection, favor demineralization over a small chip fracture. 2. Prominent osteopenia. Electronically Signed   By: Monte Fantasia M.D.   On: 08/12/2017 10:56   Ct Head Wo Contrast  Result Date: 08/12/2017 CLINICAL DATA:  Fall.  Minor head trauma EXAM: CT HEAD WITHOUT CONTRAST CT CERVICAL SPINE WITHOUT CONTRAST TECHNIQUE: Multidetector CT imaging of the head and cervical spine was performed following the standard protocol without intravenous contrast. Multiplanar CT image reconstructions of the cervical spine were also generated. COMPARISON:  06/06/2016 FINDINGS: CT HEAD FINDINGS Brain: Mild atrophy. Extensive chronic microvascular ischemic change in the white matter. Negative for acute  infarct. Negative for hemorrhage or mass. Vascular: Atherosclerotic calcification. Negative for hyperdense vessel Skull: Negative for fracture Sinuses/Orbits: Negative Other: None CT CERVICAL SPINE FINDINGS Alignment: Mild anterolisthesis C3-4. Skull base and vertebrae: Negative for fracture. No bony lesion identified Soft tissues and spinal canal: Negative for soft tissue mass. Extensive atherosclerotic calcification in the carotid artery bilaterally. Disc levels: Disc degeneration and spondylosis C3 through C6. Mild facet degeneration on the left C2 through C4. Upper chest: Lung apices clear Other: None IMPRESSION: Atrophy and chronic microvascular ischemia. No  acute intracranial abnormality Cervical spondylosis.  Negative for cervical fracture. Electronically Signed   By: Franchot Gallo M.D.   On: 08/12/2017 13:29   Ct Cervical Spine Wo Contrast  Result Date: 08/12/2017 CLINICAL DATA:  Fall.  Minor head trauma EXAM: CT HEAD WITHOUT CONTRAST CT CERVICAL SPINE WITHOUT CONTRAST TECHNIQUE: Multidetector CT imaging of the head and cervical spine was performed following the standard protocol without intravenous contrast. Multiplanar CT image reconstructions of the cervical spine were also generated. COMPARISON:  06/06/2016 FINDINGS: CT HEAD FINDINGS Brain: Mild atrophy. Extensive chronic microvascular ischemic change in the white matter. Negative for acute infarct. Negative for hemorrhage or mass. Vascular: Atherosclerotic calcification. Negative for hyperdense vessel Skull: Negative for fracture Sinuses/Orbits: Negative Other: None CT CERVICAL SPINE FINDINGS Alignment: Mild anterolisthesis C3-4. Skull base and vertebrae: Negative for fracture. No bony lesion identified Soft tissues and spinal canal: Negative for soft tissue mass. Extensive atherosclerotic calcification in the carotid artery bilaterally. Disc levels: Disc degeneration and spondylosis C3 through C6. Mild facet degeneration on the left C2 through  C4. Upper chest: Lung apices clear Other: None IMPRESSION: Atrophy and chronic microvascular ischemia. No acute intracranial abnormality Cervical spondylosis.  Negative for cervical fracture. Electronically Signed   By: Franchot Gallo M.D.   On: 08/12/2017 13:29   Dg Shoulder Left  Result Date: 08/12/2017 CLINICAL DATA:  Status post manipulation. EXAM: LEFT SHOULDER - 2+ VIEW COMPARISON:  Shoulder radiograph earlier today. FINDINGS: Anterior dislocation has not been reduced. AC separation redemonstrated. IMPRESSION: Anterior LEFT glenohumeral joint dislocation persists. Electronically Signed   By: Staci Righter M.D.   On: 08/12/2017 15:10   Dg Shoulder Left  Result Date: 08/12/2017 CLINICAL DATA:  Fall.  Severe left shoulder pain. EXAM: LEFT SHOULDER - 2+ VIEW COMPARISON:  03/01/2016 FINDINGS: There is an anterior left shoulder dislocation. No fracture identified. There is Abnormal appearance of the acromioclavicular joint. The acromion appears superiorly displaced with respect to the distal clavicle. Cannot rule out AC joint separation. IMPRESSION: 1. Anterior left glenohumeral joint dislocation. 2. Increased and AC joint interval suggesting AC joint separation. Electronically Signed   By: Kerby Moors M.D.   On: 08/12/2017 10:05   Dg Knee Complete 4 Views Left  Result Date: 08/12/2017 CLINICAL DATA:  Fall.  Leg pain EXAM: LEFT KNEE - COMPLETE 4+ VIEW COMPARISON:  None. FINDINGS: No evidence of fracture, dislocation, or joint effusion. No evidence of arthropathy or other focal bone abnormality. Soft tissues are unremarkable. IMPRESSION: Negative. Electronically Signed   By: Franchot Gallo M.D.   On: 08/12/2017 10:48   Dg Hip Unilat With Pelvis 2-3 Views Left  Result Date: 08/12/2017 CLINICAL DATA:  Left hip pain after fall. EXAM: DG HIP (WITH OR WITHOUT PELVIS) 2-3V LEFT COMPARISON:  None. FINDINGS: No acute fracture or malalignment. The left hip joint space is preserved. Partially visualized  lumbar biiliac posterior spinal fusion. The sacroiliac joints and pubic symphysis are intact. Osteopenia. IMPRESSION: No acute osseous abnormality. Electronically Signed   By: Titus Dubin M.D.   On: 08/12/2017 10:49    Procedures Procedures (including critical care time)  Medications Ordered in ED Medications  fentaNYL (SUBLIMAZE) injection 50 mcg (0 mcg Intravenous Hold 08/12/17 1629)  fentaNYL (SUBLIMAZE) injection 50 mcg (50 mcg Intravenous Given 08/12/17 1023)  fentaNYL (SUBLIMAZE) injection 50 mcg (50 mcg Intravenous Given 08/12/17 1127)  bupivacaine (MARCAINE) 0.5 % injection 20 mL (20 mLs Infiltration Given by Other 08/12/17 1127)  propofol (DIPRIVAN) 10 mg/mL bolus/IV push 63.5 mg (60 mg  Intravenous Given by Other 08/12/17 1419)  sodium chloride 0.9 % bolus 500 mL (0 mLs Intravenous Stopped 08/12/17 1630)  propofol (DIPRIVAN) 10 mg/mL bolus/IV push 31.8 mg (30 mg Intravenous Given by Other 08/12/17 1608)     Initial Impression / Assessment and Plan / ED Course  I have reviewed the triage vital signs and the nursing notes.  Pertinent labs & imaging results that were available during my care of the patient were reviewed by me and considered in my medical decision making (see chart for details).     Patient presents after mechanical fall and sustained left anterior shoulder dislocation. Successfully reduced at bedside, suspect dislocation during confirmatory x-ray manipulation. Second reduction successful, confirmed with ultrasound by ED attending, placed in arm sling. Will follow up promptly with her orthopedist at [place]. Remains neurovascularly intact. Given short course tramadol, medication precautions discussed. Return precautions discussed as well as signs and symptoms that necessitate prompt ED evaluation.  Final Clinical Impressions(s) / ED Diagnoses   Final diagnoses:  Shoulder dislocation, left, initial encounter    New Prescriptions Current  Discharge Medication List       Comer Locket, PA-C 08/12/17 1654    Long, Wonda Olds, MD 08/13/17 1102

## 2017-08-12 NOTE — ED Notes (Signed)
Patient is in radiology.  

## 2017-08-12 NOTE — ED Notes (Signed)
INFORMED CONSENT FOR MEDICAL/SURGICAL/DIAGNOSTIC PROCEDURES was signed by patient, Garnet Sierras RN, and Dr. Laverta Baltimore.

## 2017-08-12 NOTE — Sedation Documentation (Signed)
Ronalee Belts, RN performed conscious sedation.

## 2017-08-13 NOTE — ED Provider Notes (Signed)
.  Sedation Date/Time: 08/13/2017 10:54 AM Performed by: Margette Fast Authorized by: Margette Fast   Consent:    Consent obtained:  Written   Consent given by:  Patient   Risks discussed:  Allergic reaction, prolonged hypoxia resulting in organ damage, prolonged sedation necessitating reversal, dysrhythmia, inadequate sedation, respiratory compromise necessitating ventilatory assistance and intubation, vomiting and nausea   Alternatives discussed:  Analgesia without sedation Universal protocol:    Immediately prior to procedure a time out was called: yes     Patient identity confirmation method:  Arm band, verbally with patient and hospital-assigned identification number Indications:    Procedure performed:  Dislocation reduction   Procedure necessitating sedation performed by:  Physician performing sedation Pre-sedation assessment:    Time since last food or drink:  6 hours   ASA classification: class 2 - patient with mild systemic disease     Mallampati score:  II - soft palate, uvula, fauces visible   Pre-sedation assessments completed and reviewed: airway patency, cardiovascular function, hydration status, mental status, nausea/vomiting, pain level and respiratory function   Immediate pre-procedure details:    Reassessment: Patient reassessed immediately prior to procedure     Reviewed: vital signs     Verified: bag valve mask available, emergency equipment available, intubation equipment available, IV patency confirmed, oxygen available, reversal medications available and suction available   Procedure details (see MAR for exact dosages):    Preoxygenation:  Nasal cannula   Sedation:  Propofol   Analgesia:  Fentanyl   Intra-procedure monitoring:  Blood pressure monitoring, continuous capnometry, frequent LOC assessments, frequent vital sign checks, continuous pulse oximetry and cardiac monitor   Intra-procedure events: none     Intra-procedure management:  Airway repositioning  Total Provider sedation time (minutes):  35 Post-procedure details:    Attendance: Constant attendance by certified staff until patient recovered     Recovery: Patient returned to pre-procedure baseline     Post-sedation assessments completed and reviewed: airway patency, cardiovascular function, hydration status, mental status, nausea/vomiting and pain level     Patient is stable for discharge or admission: yes     Patient tolerance:  Tolerated well, no immediate complications Reduction of dislocation Date/Time: 08/13/2017 10:57 AM Performed by: Emylie Amster G Authorized by: Margette Fast  Consent: Written consent obtained. Risks and benefits: risks, benefits and alternatives were discussed Consent given by: patient Imaging studies: imaging studies available Required items: required blood products, implants, devices, and special equipment available Patient identity confirmed: verbally with patient Time out: Immediately prior to procedure a "time out" was called to verify the correct patient, procedure, equipment, support staff and site/side marked as required. Preparation: Patient was prepped and draped in the usual sterile fashion. Local anesthesia used: no  Anesthesia: Local anesthesia used: no  Sedation: Patient sedated: yes Sedatives: propofol Vitals: Vital signs were monitored during sedation. Patient tolerance: Patient tolerated the procedure well with no immediate complications Comments: Downward traction applied with gentle external rotation with palpable relocation of the shoulder with improved deformity and full ROM without difficulty. Shoulder immobilizer applied immediately.        Margette Fast, MD 08/13/17 1059

## 2017-08-15 DIAGNOSIS — M47812 Spondylosis without myelopathy or radiculopathy, cervical region: Secondary | ICD-10-CM | POA: Diagnosis not present

## 2017-08-15 DIAGNOSIS — Z79891 Long term (current) use of opiate analgesic: Secondary | ICD-10-CM | POA: Diagnosis not present

## 2017-08-15 DIAGNOSIS — F339 Major depressive disorder, recurrent, unspecified: Secondary | ICD-10-CM | POA: Diagnosis not present

## 2017-08-15 DIAGNOSIS — D631 Anemia in chronic kidney disease: Secondary | ICD-10-CM | POA: Diagnosis not present

## 2017-08-15 DIAGNOSIS — B353 Tinea pedis: Secondary | ICD-10-CM | POA: Diagnosis not present

## 2017-08-15 DIAGNOSIS — Z981 Arthrodesis status: Secondary | ICD-10-CM | POA: Diagnosis not present

## 2017-08-15 DIAGNOSIS — M069 Rheumatoid arthritis, unspecified: Secondary | ICD-10-CM | POA: Diagnosis not present

## 2017-08-15 DIAGNOSIS — I5031 Acute diastolic (congestive) heart failure: Secondary | ICD-10-CM | POA: Diagnosis not present

## 2017-08-15 DIAGNOSIS — G43009 Migraine without aura, not intractable, without status migrainosus: Secondary | ICD-10-CM | POA: Diagnosis not present

## 2017-08-15 DIAGNOSIS — M4135 Thoracogenic scoliosis, thoracolumbar region: Secondary | ICD-10-CM | POA: Diagnosis not present

## 2017-08-15 DIAGNOSIS — N189 Chronic kidney disease, unspecified: Secondary | ICD-10-CM | POA: Diagnosis not present

## 2017-08-15 DIAGNOSIS — I130 Hypertensive heart and chronic kidney disease with heart failure and stage 1 through stage 4 chronic kidney disease, or unspecified chronic kidney disease: Secondary | ICD-10-CM | POA: Diagnosis not present

## 2017-08-15 DIAGNOSIS — M48062 Spinal stenosis, lumbar region with neurogenic claudication: Secondary | ICD-10-CM | POA: Diagnosis not present

## 2017-08-19 ENCOUNTER — Encounter (INDEPENDENT_AMBULATORY_CARE_PROVIDER_SITE_OTHER): Payer: Self-pay | Admitting: Orthopedic Surgery

## 2017-08-19 ENCOUNTER — Other Ambulatory Visit (INDEPENDENT_AMBULATORY_CARE_PROVIDER_SITE_OTHER): Payer: Self-pay | Admitting: Orthopedic Surgery

## 2017-08-19 ENCOUNTER — Encounter (HOSPITAL_COMMUNITY): Payer: Self-pay | Admitting: *Deleted

## 2017-08-19 ENCOUNTER — Ambulatory Visit (INDEPENDENT_AMBULATORY_CARE_PROVIDER_SITE_OTHER): Payer: PPO | Admitting: Orthopedic Surgery

## 2017-08-19 ENCOUNTER — Emergency Department (HOSPITAL_COMMUNITY): Payer: PPO

## 2017-08-19 ENCOUNTER — Emergency Department (HOSPITAL_COMMUNITY)
Admission: EM | Admit: 2017-08-19 | Discharge: 2017-08-20 | Disposition: A | Discharge status: Home / Self Care | Payer: PPO | Attending: Emergency Medicine | Admitting: Emergency Medicine

## 2017-08-19 ENCOUNTER — Ambulatory Visit (INDEPENDENT_AMBULATORY_CARE_PROVIDER_SITE_OTHER): Payer: PPO

## 2017-08-19 DIAGNOSIS — S43015A Anterior dislocation of left humerus, initial encounter: Secondary | ICD-10-CM | POA: Diagnosis not present

## 2017-08-19 DIAGNOSIS — Z79899 Other long term (current) drug therapy: Secondary | ICD-10-CM | POA: Diagnosis not present

## 2017-08-19 DIAGNOSIS — I5030 Unspecified diastolic (congestive) heart failure: Secondary | ICD-10-CM | POA: Diagnosis not present

## 2017-08-19 DIAGNOSIS — M4135 Thoracogenic scoliosis, thoracolumbar region: Secondary | ICD-10-CM | POA: Diagnosis not present

## 2017-08-19 DIAGNOSIS — S4991XA Unspecified injury of right shoulder and upper arm, initial encounter: Secondary | ICD-10-CM | POA: Diagnosis not present

## 2017-08-19 DIAGNOSIS — M25512 Pain in left shoulder: Secondary | ICD-10-CM

## 2017-08-19 DIAGNOSIS — Z96653 Presence of artificial knee joint, bilateral: Secondary | ICD-10-CM | POA: Insufficient documentation

## 2017-08-19 DIAGNOSIS — I5031 Acute diastolic (congestive) heart failure: Secondary | ICD-10-CM | POA: Diagnosis not present

## 2017-08-19 DIAGNOSIS — G43009 Migraine without aura, not intractable, without status migrainosus: Secondary | ICD-10-CM | POA: Diagnosis not present

## 2017-08-19 DIAGNOSIS — D631 Anemia in chronic kidney disease: Secondary | ICD-10-CM | POA: Diagnosis not present

## 2017-08-19 DIAGNOSIS — Z79891 Long term (current) use of opiate analgesic: Secondary | ICD-10-CM | POA: Diagnosis not present

## 2017-08-19 DIAGNOSIS — M47812 Spondylosis without myelopathy or radiculopathy, cervical region: Secondary | ICD-10-CM | POA: Diagnosis not present

## 2017-08-19 DIAGNOSIS — S199XXA Unspecified injury of neck, initial encounter: Secondary | ICD-10-CM | POA: Diagnosis not present

## 2017-08-19 DIAGNOSIS — M542 Cervicalgia: Secondary | ICD-10-CM | POA: Insufficient documentation

## 2017-08-19 DIAGNOSIS — T148XXA Other injury of unspecified body region, initial encounter: Secondary | ICD-10-CM | POA: Diagnosis not present

## 2017-08-19 DIAGNOSIS — B353 Tinea pedis: Secondary | ICD-10-CM | POA: Diagnosis not present

## 2017-08-19 DIAGNOSIS — Z041 Encounter for examination and observation following transport accident: Secondary | ICD-10-CM | POA: Diagnosis not present

## 2017-08-19 DIAGNOSIS — F339 Major depressive disorder, recurrent, unspecified: Secondary | ICD-10-CM | POA: Diagnosis not present

## 2017-08-19 DIAGNOSIS — N189 Chronic kidney disease, unspecified: Secondary | ICD-10-CM | POA: Diagnosis not present

## 2017-08-19 DIAGNOSIS — S79912A Unspecified injury of left hip, initial encounter: Secondary | ICD-10-CM | POA: Diagnosis not present

## 2017-08-19 DIAGNOSIS — M48062 Spinal stenosis, lumbar region with neurogenic claudication: Secondary | ICD-10-CM | POA: Diagnosis not present

## 2017-08-19 DIAGNOSIS — S0990XA Unspecified injury of head, initial encounter: Secondary | ICD-10-CM | POA: Diagnosis not present

## 2017-08-19 DIAGNOSIS — M25552 Pain in left hip: Secondary | ICD-10-CM | POA: Insufficient documentation

## 2017-08-19 DIAGNOSIS — R51 Headache: Secondary | ICD-10-CM | POA: Diagnosis not present

## 2017-08-19 DIAGNOSIS — I130 Hypertensive heart and chronic kidney disease with heart failure and stage 1 through stage 4 chronic kidney disease, or unspecified chronic kidney disease: Secondary | ICD-10-CM | POA: Diagnosis not present

## 2017-08-19 DIAGNOSIS — Z981 Arthrodesis status: Secondary | ICD-10-CM | POA: Diagnosis not present

## 2017-08-19 DIAGNOSIS — M069 Rheumatoid arthritis, unspecified: Secondary | ICD-10-CM | POA: Diagnosis not present

## 2017-08-19 DIAGNOSIS — I110 Hypertensive heart disease with heart failure: Secondary | ICD-10-CM | POA: Diagnosis not present

## 2017-08-19 MED ORDER — OXYCODONE-ACETAMINOPHEN 5-325 MG PO TABS
1.0000 | ORAL_TABLET | ORAL | Status: DC | PRN
  Administered 2017-08-19: 1 via ORAL
  Filled 2017-08-19: qty 1

## 2017-08-19 NOTE — Care Management (Signed)
ED CM spoke with patient concerning her surgery schedule for tomorrow at [facility]. Patient should contact Pre-Op tomorrow  Morning  to confirm surgery schedule time. Patient verbalized understanding. CM made 2  Attempts to contact son [name] concerning patient is being discharged home to return tomorrow for scheduled surgery at [place]. No answer. Contacted [employer] for safety check to be performed. Charge nurse and Dr. Oleta Mouse made aware.
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
## 2017-08-19 NOTE — ED Notes (Signed)
Provider at bedside
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
## 2017-08-19 NOTE — ED Notes (Signed)
Patient transported to CT 
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
## 2017-08-20 ENCOUNTER — Encounter (HOSPITAL_COMMUNITY): Payer: Self-pay | Admitting: Anesthesiology

## 2017-08-20 ENCOUNTER — Encounter (HOSPITAL_COMMUNITY): Admission: RE | Payer: Self-pay | Source: Ambulatory Visit

## 2017-08-20 ENCOUNTER — Ambulatory Visit (HOSPITAL_COMMUNITY): Admission: RE | Admit: 2017-08-20 | Payer: PPO | Source: Ambulatory Visit | Admitting: Orthopedic Surgery

## 2017-08-20 SURGERY — CLOSED REDUCTION, SHOULDER
Anesthesia: Choice | Laterality: Left

## 2017-08-20 NOTE — ED Notes (Signed)
PT[facility] called, Son always called and informed pt is up for DC

## 2017-08-20 NOTE — Progress Notes (Signed)
Per Dr. Marlou Sa case will be rescheduled Wednesday or Thursday. Family and PTAR notified.
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
## 2017-08-21 ENCOUNTER — Ambulatory Visit: Admit: 2017-08-21 | Payer: PPO | Admitting: Orthopedic Surgery

## 2017-08-21 ENCOUNTER — Other Ambulatory Visit: Payer: Self-pay

## 2017-08-21 ENCOUNTER — Telehealth (INDEPENDENT_AMBULATORY_CARE_PROVIDER_SITE_OTHER): Payer: Self-pay

## 2017-08-21 SURGERY — CLOSED REDUCTION, SHOULDER
Anesthesia: Choice | Laterality: Left

## 2017-08-21 NOTE — Telephone Encounter (Signed)
Per Dr. Marlou Sa, patient ate lunch yesterday and could not have closed shoulder reduction yesterday evening.  He asked me to reschedule for today.  I rescheduled for today.  I could not reach patient to advise info.  I called son which in turn put caregiver on the phone.  Caregiver stated that patient was not going to have Dr. Marlou Sa do procedure.  Patient is going to seek treatment at [facility].  I canceled procedure for today and advised Dr. Marlou Sa.

## 2017-08-21 NOTE — Patient Outreach (Signed)
Outreach patient after ED visit on 08/19/17 at [facility]  There was no answer and voicemail was full.  I could not leave a voicemail message asking for a return call.  Unsuccessful letter, magnet and know before you go mailed on 08/21/17.

## 2017-08-22 ENCOUNTER — Telehealth (INDEPENDENT_AMBULATORY_CARE_PROVIDER_SITE_OTHER): Payer: Self-pay

## 2017-08-22 ENCOUNTER — Encounter (HOSPITAL_COMMUNITY): Payer: Self-pay | Admitting: *Deleted

## 2017-08-22 NOTE — Telephone Encounter (Signed)
Received call from Domenic Schwab [phone], caregiver, stating patient does not want to switch to [facility] and wants Dr. Marlou Sa to do surgery.  I spoke with Dr. Marlou Sa and he stated he would do tomorrow afternoon.  I scheduled for tomorrow afternoon and called caregiver to advise of information.  Instructions of NPO after midnight tonight were given and for patient to arrive at hospital at 12:30 p.m. tomorrow.

## 2017-08-23 ENCOUNTER — Ambulatory Visit (HOSPITAL_COMMUNITY): Payer: PPO | Admitting: Certified Registered"

## 2017-08-23 ENCOUNTER — Observation Stay (HOSPITAL_COMMUNITY)
Admission: RE | Admit: 2017-08-23 | Discharge: 2017-08-27 | Disposition: A | Discharge status: Home / Self Care | Payer: PPO | Source: Ambulatory Visit | Attending: Orthopedic Surgery | Admitting: Orthopedic Surgery

## 2017-08-23 ENCOUNTER — Encounter (HOSPITAL_COMMUNITY)
Admission: RE | Disposition: A | Discharge status: Home / Self Care | Payer: Self-pay | Source: Ambulatory Visit | Attending: Orthopedic Surgery

## 2017-08-23 ENCOUNTER — Encounter (HOSPITAL_COMMUNITY): Payer: Self-pay

## 2017-08-23 ENCOUNTER — Observation Stay (HOSPITAL_COMMUNITY): Payer: PPO

## 2017-08-23 DIAGNOSIS — M24412 Recurrent dislocation, left shoulder: Secondary | ICD-10-CM | POA: Diagnosis not present

## 2017-08-23 DIAGNOSIS — M797 Fibromyalgia: Secondary | ICD-10-CM | POA: Insufficient documentation

## 2017-08-23 DIAGNOSIS — M8580 Other specified disorders of bone density and structure, unspecified site: Secondary | ICD-10-CM | POA: Diagnosis not present

## 2017-08-23 DIAGNOSIS — I509 Heart failure, unspecified: Secondary | ICD-10-CM | POA: Diagnosis not present

## 2017-08-23 DIAGNOSIS — M25511 Pain in right shoulder: Secondary | ICD-10-CM

## 2017-08-23 DIAGNOSIS — F1120 Opioid dependence, uncomplicated: Secondary | ICD-10-CM | POA: Diagnosis not present

## 2017-08-23 DIAGNOSIS — M069 Rheumatoid arthritis, unspecified: Secondary | ICD-10-CM | POA: Insufficient documentation

## 2017-08-23 DIAGNOSIS — I130 Hypertensive heart and chronic kidney disease with heart failure and stage 1 through stage 4 chronic kidney disease, or unspecified chronic kidney disease: Secondary | ICD-10-CM | POA: Diagnosis not present

## 2017-08-23 DIAGNOSIS — Z79899 Other long term (current) drug therapy: Secondary | ICD-10-CM | POA: Diagnosis not present

## 2017-08-23 DIAGNOSIS — F329 Major depressive disorder, single episode, unspecified: Secondary | ICD-10-CM | POA: Diagnosis not present

## 2017-08-23 DIAGNOSIS — S43085A Other dislocation of left shoulder joint, initial encounter: Secondary | ICD-10-CM | POA: Diagnosis not present

## 2017-08-23 DIAGNOSIS — M4320 Fusion of spine, site unspecified: Secondary | ICD-10-CM | POA: Diagnosis not present

## 2017-08-23 DIAGNOSIS — B999 Unspecified infectious disease: Secondary | ICD-10-CM | POA: Diagnosis not present

## 2017-08-23 DIAGNOSIS — Z8739 Personal history of other diseases of the musculoskeletal system and connective tissue: Secondary | ICD-10-CM

## 2017-08-23 DIAGNOSIS — I10 Essential (primary) hypertension: Secondary | ICD-10-CM | POA: Diagnosis not present

## 2017-08-23 DIAGNOSIS — K219 Gastro-esophageal reflux disease without esophagitis: Secondary | ICD-10-CM | POA: Diagnosis not present

## 2017-08-23 DIAGNOSIS — S43005A Unspecified dislocation of left shoulder joint, initial encounter: Secondary | ICD-10-CM | POA: Diagnosis not present

## 2017-08-23 DIAGNOSIS — G250 Essential tremor: Secondary | ICD-10-CM | POA: Insufficient documentation

## 2017-08-23 DIAGNOSIS — S43006A Unspecified dislocation of unspecified shoulder joint, initial encounter: Secondary | ICD-10-CM | POA: Diagnosis present

## 2017-08-23 DIAGNOSIS — G8929 Other chronic pain: Secondary | ICD-10-CM

## 2017-08-23 DIAGNOSIS — S43084D Other dislocation of right shoulder joint, subsequent encounter: Secondary | ICD-10-CM | POA: Diagnosis not present

## 2017-08-23 DIAGNOSIS — N189 Chronic kidney disease, unspecified: Secondary | ICD-10-CM | POA: Insufficient documentation

## 2017-08-23 DIAGNOSIS — M549 Dorsalgia, unspecified: Secondary | ICD-10-CM | POA: Diagnosis not present

## 2017-08-23 DIAGNOSIS — J9691 Respiratory failure, unspecified with hypoxia: Secondary | ICD-10-CM | POA: Diagnosis not present

## 2017-08-23 DIAGNOSIS — Z0000 Encounter for general adult medical examination without abnormal findings: Secondary | ICD-10-CM | POA: Diagnosis not present

## 2017-08-23 DIAGNOSIS — R5381 Other malaise: Secondary | ICD-10-CM | POA: Diagnosis not present

## 2017-08-23 DIAGNOSIS — I5031 Acute diastolic (congestive) heart failure: Secondary | ICD-10-CM | POA: Diagnosis not present

## 2017-08-23 HISTORY — PX: SHOULDER CLOSED REDUCTION: SHX1051

## 2017-08-23 HISTORY — DX: Cardiac murmur, unspecified: R01.1

## 2017-08-23 HISTORY — DX: Heart failure, unspecified: I50.9

## 2017-08-23 HISTORY — DX: Unspecified hearing loss, unspecified ear: H91.90

## 2017-08-23 LAB — POCT I-STAT 4, (NA,K, GLUC, HGB,HCT)
GLUCOSE: 107 mg/dL — AB (ref 65–99)
HCT: 37 % (ref 36.0–46.0)
HEMOGLOBIN: 12.6 g/dL (ref 12.0–15.0)
POTASSIUM: 4.2 mmol/L (ref 3.5–5.1)
SODIUM: 140 mmol/L (ref 135–145)

## 2017-08-23 SURGERY — CLOSED REDUCTION, SHOULDER
Anesthesia: General | Laterality: Left

## 2017-08-23 MED ORDER — DULOXETINE HCL 60 MG PO CPEP
60.0000 mg | ORAL_CAPSULE | Freq: Two times a day (BID) | ORAL | Status: DC
  Administered 2017-08-23 – 2017-08-27 (×8): 60 mg via ORAL
  Filled 2017-08-23 (×4): qty 1
  Filled 2017-08-23: qty 2
  Filled 2017-08-23 (×3): qty 1

## 2017-08-23 MED ORDER — OXYCODONE HCL 5 MG PO TABS
5.0000 mg | ORAL_TABLET | Freq: Four times a day (QID) | ORAL | Status: DC | PRN
  Administered 2017-08-23 – 2017-08-27 (×13): 5 mg via ORAL
  Filled 2017-08-23 (×13): qty 1

## 2017-08-23 MED ORDER — FENTANYL CITRATE (PF) 100 MCG/2ML IJ SOLN
25.0000 ug | INTRAMUSCULAR | Status: DC | PRN
  Administered 2017-08-23 (×2): 50 ug via INTRAVENOUS

## 2017-08-23 MED ORDER — DEXAMETHASONE SODIUM PHOSPHATE 10 MG/ML IJ SOLN
INTRAMUSCULAR | Status: DC | PRN
  Administered 2017-08-23: 4 mg via INTRAVENOUS

## 2017-08-23 MED ORDER — ROCURONIUM BROMIDE 100 MG/10ML IV SOLN
INTRAVENOUS | Status: DC | PRN
  Administered 2017-08-23: 30 mg via INTRAVENOUS

## 2017-08-23 MED ORDER — PROPRANOLOL HCL 20 MG PO TABS
20.0000 mg | ORAL_TABLET | Freq: Two times a day (BID) | ORAL | Status: DC
  Administered 2017-08-23 – 2017-08-27 (×8): 20 mg via ORAL
  Filled 2017-08-23 (×10): qty 1

## 2017-08-23 MED ORDER — ACETAMINOPHEN 325 MG PO TABS
650.0000 mg | ORAL_TABLET | Freq: Four times a day (QID) | ORAL | Status: DC | PRN
  Administered 2017-08-24 – 2017-08-27 (×6): 650 mg via ORAL
  Filled 2017-08-23 (×6): qty 2

## 2017-08-23 MED ORDER — ACETAMINOPHEN 650 MG RE SUPP: 650.0000 mg | Freq: Four times a day (QID) | RECTAL | Status: DC | PRN

## 2017-08-23 MED ORDER — PROPOFOL 10 MG/ML IV BOLUS
INTRAVENOUS | Status: DC | PRN
  Administered 2017-08-23: 100 mg via INTRAVENOUS
  Administered 2017-08-23: 40 mg via INTRAVENOUS

## 2017-08-23 MED ORDER — ONDANSETRON HCL 4 MG/2ML IJ SOLN
INTRAMUSCULAR | Status: AC
  Filled 2017-08-23: qty 2

## 2017-08-23 MED ORDER — SUGAMMADEX SODIUM 200 MG/2ML IV SOLN
INTRAVENOUS | Status: DC | PRN
  Administered 2017-08-23: 200 mg via INTRAVENOUS

## 2017-08-23 MED ORDER — FENTANYL CITRATE (PF) 100 MCG/2ML IJ SOLN
INTRAMUSCULAR | Status: AC
  Administered 2017-08-23: 50 ug via INTRAVENOUS
  Filled 2017-08-23: qty 2

## 2017-08-23 MED ORDER — SENNA 8.6 MG PO TABS
2.0000 | ORAL_TABLET | Freq: Every day | ORAL | Status: DC
  Administered 2017-08-23 – 2017-08-26 (×4): 17.2 mg via ORAL
  Filled 2017-08-23 (×4): qty 2

## 2017-08-23 MED ORDER — ACETAMINOPHEN 325 MG PO TABS
ORAL_TABLET | ORAL | Status: AC
  Filled 2017-08-23: qty 2

## 2017-08-23 MED ORDER — SUCCINYLCHOLINE CHLORIDE 20 MG/ML IJ SOLN
INTRAMUSCULAR | Status: DC | PRN
  Administered 2017-08-23: 80 mg via INTRAVENOUS

## 2017-08-23 MED ORDER — ENSURE ENLIVE PO LIQD
237.0000 mL | Freq: Two times a day (BID) | ORAL | Status: DC
  Administered 2017-08-24: 237 mL via ORAL

## 2017-08-23 MED ORDER — METHOTREXATE SODIUM CHEMO INJECTION 50 MG/2ML
17.5000 mg | INTRAMUSCULAR | Status: DC
  Filled 2017-08-23: qty 0.7

## 2017-08-23 MED ORDER — FERROUS SULFATE 325 (65 FE) MG PO TABS
325.0000 mg | ORAL_TABLET | Freq: Every evening | ORAL | Status: DC
  Administered 2017-08-23 – 2017-08-26 (×4): 325 mg via ORAL
  Filled 2017-08-23 (×3): qty 1

## 2017-08-23 MED ORDER — CALCIUM CARBONATE-VITAMIN D 500-200 MG-UNIT PO TABS
1.0000 | ORAL_TABLET | Freq: Every day | ORAL | Status: DC
  Administered 2017-08-24 – 2017-08-27 (×4): 1 via ORAL
  Filled 2017-08-23 (×4): qty 1

## 2017-08-23 MED ORDER — FOLIC ACID 1 MG PO TABS
3.0000 mg | ORAL_TABLET | Freq: Every day | ORAL | Status: DC
  Administered 2017-08-23 – 2017-08-27 (×5): 3 mg via ORAL
  Filled 2017-08-23 (×4): qty 3

## 2017-08-23 MED ORDER — FENTANYL CITRATE (PF) 250 MCG/5ML IJ SOLN
INTRAMUSCULAR | Status: AC
  Filled 2017-08-23: qty 5

## 2017-08-23 MED ORDER — OXYCODONE HCL 5 MG PO TABS
5.0000 mg | ORAL_TABLET | Freq: Once | ORAL | Status: AC
  Administered 2017-08-23: 5 mg via ORAL

## 2017-08-23 MED ORDER — PHENYLEPHRINE 40 MCG/ML (10ML) SYRINGE FOR IV PUSH (FOR BLOOD PRESSURE SUPPORT)
PREFILLED_SYRINGE | INTRAVENOUS | Status: DC | PRN
  Administered 2017-08-23: 80 ug via INTRAVENOUS

## 2017-08-23 MED ORDER — CALCIUM CARB-CHOLECALCIFEROL 600-200 MG-UNIT PO TABS: 1.0000 | ORAL_TABLET | Freq: Every day | ORAL | Status: DC

## 2017-08-23 MED ORDER — OXYCODONE HCL 5 MG PO TABS
ORAL_TABLET | ORAL | Status: AC
  Filled 2017-08-23: qty 2

## 2017-08-23 MED ORDER — ONDANSETRON HCL 4 MG/2ML IJ SOLN
INTRAMUSCULAR | Status: DC | PRN
  Administered 2017-08-23: 4 mg via INTRAVENOUS

## 2017-08-23 MED ORDER — PROPOFOL 10 MG/ML IV BOLUS
INTRAVENOUS | Status: AC
  Filled 2017-08-23: qty 20

## 2017-08-23 MED ORDER — ACETAMINOPHEN 500 MG PO TABS
500.0000 mg | ORAL_TABLET | Freq: Four times a day (QID) | ORAL | Status: DC | PRN
Start: 2017-08-23 — End: 2017-08-23
  Administered 2017-08-23: 650 mg via ORAL

## 2017-08-23 MED ORDER — LACTATED RINGERS IV SOLN
INTRAVENOUS | Status: DC | PRN
  Administered 2017-08-23: 13:00:00 via INTRAVENOUS

## 2017-08-23 MED ORDER — PANTOPRAZOLE SODIUM 40 MG PO TBEC
40.0000 mg | DELAYED_RELEASE_TABLET | Freq: Every day | ORAL | Status: DC
  Administered 2017-08-23 – 2017-08-27 (×5): 40 mg via ORAL
  Filled 2017-08-23 (×4): qty 1

## 2017-08-23 MED ORDER — LIDOCAINE 2% (20 MG/ML) 5 ML SYRINGE
INTRAMUSCULAR | Status: AC
  Filled 2017-08-23: qty 5

## 2017-08-23 MED ORDER — POLYETHYLENE GLYCOL 3350 17 G PO PACK
17.0000 g | PACK | Freq: Every day | ORAL | Status: DC
  Administered 2017-08-24 – 2017-08-25 (×2): 17 g via ORAL
  Filled 2017-08-23 (×4): qty 1

## 2017-08-23 MED ORDER — POLYETHYLENE GLYCOL 3350 17 GM/SCOOP PO POWD: 17.0000 g | Freq: Every day | ORAL | Status: DC

## 2017-08-23 NOTE — H&P (Signed)
Dominique Mckee is an 83 y.o. female.   Chief Complaint: Left shoulder pain HPI: Dominique Mckee is an 83 year old female with left shoulder pain.  She sustained a dislocation to the left shoulder 9 days ago.  Reduction was attempted in the emergency room and it was believed that the shoulder was back in socket.  When she arrived in my clinic 1 week later as a new patient the shoulder was dislocated.  We attempted to place her on the operative schedule for the following day but she ate food and was not able to have her surgery done at that day.  Subsequently her caregiver team decided to treat her somewhere else.  Patient did not want that and she presents now for management of this problem.  She did have a history of dislocation on the right shoulder which was reduced.  Past Medical History:  Diagnosis Date  . Arthritis   . Benign essential tremor   . Cervical spondylosis   . Cervicogenic headache   . CHF (congestive heart failure) ([place])   . Chronic renal insufficiency   . Degenerative disc disease   . Depression   . Fibromyalgia   . Gait disorder   . GERD (gastroesophageal reflux disease)   . Heart murmur   . HOH (hard of hearing)   . Hypertension   . Lumbar spondylosis   . Migraine headache   . Mild obesity   . Osteopenia   . Rheumatoid arthritis [facility])     Past Surgical History:  Procedure Laterality Date  . ABDOMINAL HYSTERECTOMY    . APPENDECTOMY    . CHOLECYSTECTOMY    . COLONOSCOPY    . ELBOW SURGERY Left   . HAMMER TOE SURGERY Left   . LUMBAR FUSION  02/2017  . SHOULDER CLOSED REDUCTION Right 07/08/2015   Procedure: RIGHT SHOULDER CLOSED REDUCTION;  Surgeon: Meredith Pel, MD;  Location: [facility];  Service: Orthopedics;  Laterality: Right;  . TONSILLECTOMY    . TOTAL KNEE ARTHROPLASTY Right    right    Family History  Problem Relation Age of Onset  . Osteoporosis Mother   . Intracerebral hemorrhage Mother 3  . AAA (abdominal aortic aneurysm) Father 14  . Lung cancer  Brother   . Diabetes Brother   . Lung cancer Brother   . Diabetes Sister   . Lung cancer Sister   . Bone cancer Sister   . Kidney failure Son        left kidney transplant  . Diabetes type I Daughter   . Anesthesia problems Neg Hx   . Hypertension Neg Hx    Social History:  reports that she has never smoked. She has never used smokeless tobacco. She reports that she does not drink alcohol or use drugs.  Allergies:  Allergies  Allergen Reactions  . Feldene [Piroxicam]     Kidney issues  . Clinoril [Sulindac] Rash  . Morphine And Related Nausea And Vomiting    Medications Prior to Admission  Medication Sig Dispense Refill  . acetaminophen (TYLENOL) 500 MG tablet Take 500 mg by mouth every 6 (six) hours as needed for mild pain.    . Calcium Carb-Cholecalciferol (CALCIUM 600+D3) 600-200 MG-UNIT TABS Take 1 tablet by mouth daily.    . diclofenac sodium (VOLTAREN) 1 % GEL Apply 1 application topically 2 (two) times daily as needed (pain).     . DULoxetine (CYMBALTA) 30 MG capsule Take 60 mg by mouth 2 (two) times daily.     Marland Kitchen  ferrous sulfate 325 (65 FE) MG tablet Take 325 mg by mouth every evening.    . folic acid (FOLVITE) 1 MG tablet Take 3 mg by mouth daily.     . methotrexate 50 MG/2ML injection Inject 0.7 mLs into the muscle every 7 (seven) days. 0.86ml = 17.5mg   0  . oxyCODONE (ROXICODONE) 5 MG immediate release tablet Take 5 mg by mouth every 6 (six) hours as needed for severe pain.    . polyethylene glycol powder (MIRALAX) powder Take 17 g by mouth daily. 850 g 0  . propranolol (INDERAL) 20 MG tablet Take 1 tablet (20 mg total) by mouth 2 (two) times daily. 60 tablet 0  . fentaNYL (DURAGESIC - DOSED MCG/HR) 25 MCG/HR patch Place 1 patch (25 mcg total) onto the skin every 3 (three) days. (Patient not taking: Reported on 08/12/2017) 5 patch 0  . gabapentin (NEURONTIN) 100 MG capsule take 1 to 2 capsules by mouth three times a day if needed (Patient not taking: Reported on  08/12/2017) 180 capsule 1  . Oxycodone HCl 10 MG TABS Take 1 tablet (10 mg total) by mouth 3 (three) times daily. (Patient not taking: Reported on 08/12/2017) 90 tablet 0  . pantoprazole (PROTONIX) 40 MG tablet Take 40 mg by mouth daily.    Marland Kitchen senna (SENOKOT) 8.6 MG TABS tablet Take 2 tablets (17.2 mg total) by mouth at bedtime. 120 each 0    No results found for this or any previous visit (from the past 48 hour(s)). No results found.  Review of Systems  Musculoskeletal: Positive for joint pain.  All other systems reviewed and are negative.   Blood pressure (!) 185/60, pulse 60, temperature (!) 97.5 F (36.4 C), temperature source Oral, resp. rate 20, height 5\' 3"  (1.6 m), weight 140 lb (63.5 kg), SpO2 96 %. Physical Exam  Constitutional: She appears well-developed.  HENT:  Head: Normocephalic.  Eyes: Pupils are equal, round, and reactive to light.  Neck: Normal range of motion.  Cardiovascular: Normal rate.   Respiratory: Effort normal.  Neurological: She is alert.  Skin: Skin is warm.  Psychiatric: She has a normal mood and affect.  Left shoulder exam demonstrates functional deltoid but no obvious deformity.  Range of motion is painful.  Elbow wrist range of motion nonpainful.  EPL FPL interosseous intact.  Radial pulse intact on the left  Assessment/Plan Impression is left shoulder dislocation.  Plan is attempt at closed reduction with anesthesia.  If it is not reducible or if it does not stay reduced then further imaging will be done and we may need to consider open reduction or reverse shoulder replacement.  Risk and benefits discussed with the patient in clinic earlier this week.  All questions answered.  We will plan for 23-hour observation in order to see if we can get her qualified for a [facility] lift.  Anderson Malta, MD 08/23/2017, 1:00 PM

## 2017-08-23 NOTE — Progress Notes (Signed)
Gerald Stabs (caregiver) called asking about what medications that she needs to take this morning before coming in for surgery.   I instructed caregiver to give patient   acetaminophen (TYLENOL) if needed Duloxetine (CYMBALTA)  oxyCODONE (ROXICODONE)  If needed patient took at 6am this morning and can have again 6 hours later  pantoprazole (PROTONIX) propranolol (INDERAL)

## 2017-08-23 NOTE — Anesthesia Postprocedure Evaluation (Signed)
Anesthesia Post Note  Patient: Dominique Mckee  Procedure(s) Performed: CLOSED REDUCTION LEFT SHOULDER (Left )     Patient location during evaluation: PACU Anesthesia Type: General Level of consciousness: awake and alert, patient cooperative and oriented Pain management: pain level controlled Vital Signs Assessment: post-procedure vital signs reviewed and stable Respiratory status: spontaneous breathing, nonlabored ventilation, respiratory function stable and patient connected to nasal cannula oxygen Cardiovascular status: blood pressure returned to baseline and stable Postop Assessment: no apparent nausea or vomiting Anesthetic complications: no    Last Vitals:  Vitals:   08/23/17 1414 08/23/17 1430  BP: (!) 164/82 (!) 161/67  Pulse: (!) 59   Resp: 15   Temp: (!) 36.4 C   SpO2: 97%     Last Pain:  Vitals:   08/23/17 1414  TempSrc:   PainSc: 0-No pain                 Saylor Murry,E. Bawi Lakins

## 2017-08-23 NOTE — Transfer of Care (Signed)
Immediate Anesthesia Transfer of Care Note  Patient: OMEGA DURANTE  Procedure(s) Performed: CLOSED REDUCTION LEFT SHOULDER (Left )  Patient Location: PACU  Anesthesia Type:General  Level of Consciousness: drowsy  Airway & Oxygen Therapy: Patient Spontanous Breathing and Patient connected to nasal cannula oxygen  Post-op Assessment: Report given to RN and Post -op Vital signs reviewed and stable  Post vital signs: Reviewed and stable  Last Vitals:  Vitals:   08/23/17 1239  BP: (!) 185/60  Pulse: 60  Resp: 20  Temp: (!) 36.4 C  SpO2: 96%    Last Pain:  Vitals:   08/23/17 1258  TempSrc:   PainSc: 4       Patients Stated Pain Goal: 3 (12/10/15 3567)  Complications: No apparent anesthesia complications

## 2017-08-23 NOTE — Anesthesia Preprocedure Evaluation (Addendum)
Anesthesia Evaluation  Patient identified by MRN, date of birth, ID band Patient awake    Reviewed: Allergy & Precautions, NPO status , Patient's Chart, lab work & pertinent test results, reviewed documented beta blocker date and time   History of Anesthesia Complications Negative for: history of anesthetic complications  Airway Mallampati: II  TM Distance: >3 FB Neck ROM: Full    Dental  (+) Dental Advisory Given   Pulmonary neg pulmonary ROS,    breath sounds clear to auscultation       Cardiovascular hypertension, Pt. on medications and Pt. on home beta blockers (-) angina+ Valvular Problems/Murmurs (severe MR) MR  Rhythm:Regular Rate:Normal  '17 ECHO: EF 55%, Mitral valve: Mildly calcified annulus. Mildly calcified leaflets, highly eccentric, posteriorly-directed MR that was probably severe   Neuro/Psych  Headaches, Depression Essential tremor    GI/Hepatic Neg liver ROS, GERD  Medicated and Controlled,  Endo/Other  negative endocrine ROS  Renal/GU Renal InsufficiencyRenal disease     Musculoskeletal  (+) Arthritis , Fibromyalgia -, narcotic dependent  Abdominal   Peds  Hematology negative hematology ROS (+)   Anesthesia Other Findings   Reproductive/Obstetrics                            Anesthesia Physical Anesthesia Plan  ASA: III  Anesthesia Plan: General   Post-op Pain Management:    Induction: Intravenous  PONV Risk Score and Plan: 3 and Ondansetron, Dexamethasone and Treatment may vary due to age or medical condition  Airway Management Planned: Natural Airway and Mask  Additional Equipment:   Intra-op Plan:   Post-operative Plan:   Informed Consent: I have reviewed the patients History and Physical, chart, labs and discussed the procedure including the risks, benefits and alternatives for the proposed anesthesia with the patient or authorized representative who has  indicated his/her understanding and acceptance.   Dental advisory given  Plan Discussed with: CRNA and Surgeon  Anesthesia Plan Comments: (Plan routine monitors, GA)        Anesthesia Quick Evaluation

## 2017-08-23 NOTE — Anesthesia Procedure Notes (Signed)
Procedure Name: Intubation Date/Time: 08/23/2017 1:36 PM Performed by: Orlie Dakin Pre-anesthesia Checklist: Emergency Drugs available, Patient identified, Suction available, Patient being monitored and Timeout performed Patient Re-evaluated:Patient Re-evaluated prior to induction Oxygen Delivery Method: Circle system utilized Preoxygenation: Pre-oxygenation with 100% oxygen Induction Type: IV induction Ventilation: Mask ventilation without difficulty Laryngoscope Size: Miller and 3 Grade View: Grade II Tube type: Oral Tube size: 7.0 mm Number of attempts: 1 Airway Equipment and Method: Stylet Placement Confirmation: ETT inserted through vocal cords under direct vision,  positive ETCO2 and breath sounds checked- equal and bilateral Secured at: 23 cm Tube secured with: Tape Dental Injury: Teeth and Oropharynx as per pre-operative assessment  Comments: 4x4s bite block used.

## 2017-08-23 NOTE — Brief Op Note (Signed)
08/23/2017  2:11 PM  PATIENT:  [name]  83 y.o. female  PRE-OPERATIVE DIAGNOSIS:  left shoulder dislocation  POST-OPERATIVE DIAGNOSIS:  left shoulder dislocation  PROCEDURE:  Procedure(s): CLOSED REDUCTION LEFT SHOULDER  SURGEON:  Surgeon(s): Meredith Pel, MD  ASSISTANT:none  ANESTHESIA:   general  EBL: 0 ml    No intake/output data recorded.  BLOOD ADMINISTERED: none  DRAINS: none   LOCAL MEDICATIONS USED:  none  SPECIMEN:  No Specimen  COUNTS:  YES  TOURNIQUET:  * No tourniquets in log *  DICTATION: .Other Dictation: Dictation Number [id number]  PLAN OF CARE: Admit for overnight observation  PATIENT DISPOSITION:  PACU - hemodynamically stable

## 2017-08-24 ENCOUNTER — Observation Stay (HOSPITAL_COMMUNITY): Payer: PPO

## 2017-08-24 ENCOUNTER — Encounter (HOSPITAL_COMMUNITY): Payer: Self-pay | Admitting: Orthopedic Surgery

## 2017-08-24 DIAGNOSIS — M19011 Primary osteoarthritis, right shoulder: Secondary | ICD-10-CM | POA: Diagnosis not present

## 2017-08-24 DIAGNOSIS — M24412 Recurrent dislocation, left shoulder: Secondary | ICD-10-CM | POA: Diagnosis not present

## 2017-08-24 MED ORDER — ADULT MULTIVITAMIN W/MINERALS CH
1.0000 | ORAL_TABLET | Freq: Every day | ORAL | Status: DC
  Administered 2017-08-25 – 2017-08-27 (×3): 1 via ORAL
  Filled 2017-08-24 (×3): qty 1

## 2017-08-24 MED ORDER — ENSURE ENLIVE PO LIQD
237.0000 mL | Freq: Three times a day (TID) | ORAL | Status: DC
  Administered 2017-08-24 – 2017-08-26 (×6): 237 mL via ORAL
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
## 2017-08-24 NOTE — Progress Notes (Signed)
Pt's caregiver Vania Rea called and would like the Social Worker to call her at [phone]. Pt's son would like to speak to the Education officer, museum also. RN notified SW.

## 2017-08-24 NOTE — Progress Notes (Signed)
     Subjective: 1 Day Post-Op Procedure(s) (LRB): CLOSED REDUCTION LEFT SHOULDER (Left) Underwent left shoulder relocation. Has history of left hemiparesis due to stroke and weakness post thoracolumbar fusion. History of RA. She dislocated the right shoulder in the past and Dr. Marlou Sa did a reduction, at that time her medical situation was better and she was less dependent however she did require transfer to a SNF at [facility] in order to get her back to reasonable stable enviroment. She has significant limitations that require 24 hour/7 day observation. She is high risk for falls due to left leg weakness. Now with essentially left upper and lower extremity impairment. Apparently the family has a  Prescription to get a [employer] fast streamer for the home but her primary care giver at home is her son and he reports that he is getting ready to leave town this weekend.  Patient reports pain as mild.    Objective:   VITALS:  Temp:  [97.5 F (36.4 C)-98.9 F (37.2 C)] 97.6 F (36.4 C) (10/27 0602) Pulse Rate:  [57-65] 63 (10/27 0602) Resp:  [13-25] 16 (10/26 1900) BP: (132-185)/(57-82) 132/57 (10/27 0602) SpO2:  [93 %-100 %] 95 % (10/27 0602) Weight:  [140 lb (63.5 kg)] 140 lb (63.5 kg) (10/26 1258)  Neurologically intact ABD soft Neurovascular intact Sensation intact distally Intact pulses distally Weak left knee extension, History of RA, History of T-L fusion with sequelae left leg weakness.   LABS  Recent Labs  08/23/17 1302  HGB 12.6    Recent Labs  08/23/17 1302  NA 140  K 4.2  GLUCOSE 107*   No results for input(s): LABPT, INR in the last 72 hours.   Assessment/Plan: 1 Day Post-Op Procedure(s) (LRB): CLOSED REDUCTION LEFT SHOULDER (Left)  Advance diet Up with therapy D/C IV fluids Discharge to SNF  Basil Dess 08/24/2017, 10:42 AM Patient ID: Dominique Mckee, female   DOB: 1930-05-03, 83 y.o.   MRN: [id number]

## 2017-08-24 NOTE — Progress Notes (Signed)
Initial Nutrition Assessment  DOCUMENTATION CODES:   Non-severe (moderate) malnutrition in context of chronic illness  INTERVENTION:    Ensure Enlive po TID, each supplement provides 350 kcal and 20 grams of protein  MVI daily  NUTRITION DIAGNOSIS:   Moderate Malnutrition related to chronic illness (DDD with pain) as evidenced by mild muscle depletion, mild fat depletion.  GOAL:   Patient will meet greater than or equal to 90% of their needs  MONITOR:   PO intake, Supplement acceptance  REASON FOR ASSESSMENT:   Malnutrition Screening Tool    ASSESSMENT:   83 yo female with PMH of arthritis, benign essential tremor, cervical and lumbar spondylosis, CHF, CRI, DDD, depression, fibromyalgia, GERD, HOH, HTN who was admitted on 10/26 with L shoulder dislocation. S/P closed reduction of L shoulder on admission.  Patient reports poor intake for the past few months due to hip and back pain. She did not eat well at lunch today. She likes Ensure, drinks it at home sometimes. She has lost 25 lbs within the past year, 15% weight loss within one year is not significant for the time frame. Labs and medications reviewed.  NUTRITION - FOCUSED PHYSICAL EXAM:    Most Recent Value  Orbital Region  Mild depletion  Upper Arm Region  Mild depletion  Thoracic and Lumbar Region  Mild depletion  Buccal Region  No depletion  Temple Region  Mild depletion  Clavicle Bone Region  Unable to assess  Clavicle and Acromion Bone Region  Unable to assess  Scapular Bone Region  Mild depletion  Dorsal Hand  Moderate depletion  Patellar Region  Mild depletion  Anterior Thigh Region  Mild depletion  Posterior Calf Region  Mild depletion  Edema (RD Assessment)  Mild  Hair  Reviewed  Eyes  Reviewed  Mouth  Reviewed  Skin  Reviewed  Nails  Reviewed       Diet Order:  Diet regular Room service appropriate? Yes; Fluid consistency: Thin Diet - low sodium heart healthy  EDUCATION NEEDS:   No  education needs have been identified at this time  Skin:  Skin Assessment: Reviewed RN Assessment  Last BM:  10/24  Height:   Ht Readings from Last 1 Encounters:  08/23/17 5\' 3"  (1.6 m)    Weight:   Wt Readings from Last 1 Encounters:  08/23/17 140 lb (63.5 kg)    Ideal Body Weight:  52.3 kg  BMI:  Body mass index is 24.8 kg/m.  Estimated Nutritional Needs:   Kcal:  1400-1600  Protein:  65-75 gm  Fluid:  1.4-1.6 L    Molli Barrows, RD, LDN, CNSC Pager [phone] After Hours Pager [phone]

## 2017-08-25 DIAGNOSIS — M24412 Recurrent dislocation, left shoulder: Secondary | ICD-10-CM | POA: Diagnosis not present

## 2017-08-25 NOTE — Progress Notes (Signed)
CSW spoke with patient's son over the phone on discussing discharge plan. Patient's son was able to research and contact some caregivers that could help with the patient at home, but they aren't able to assess the patient until Monday. Patient's son indicated that he's still looking to see if someone would be able to come sooner, but so far that is the earliest he's been able to find for someone to come and help out with the patient's care.   Patient's son is hopeful that the patient can stay in the hospital until Monday so that he can arrange all the paperwork for the caregiver to be available when the patient's son leaves for work. Patient's son discussed frustration and worry that he will have to push back when he can start on this new job, and is hopeful that his new employer will understand.   CSW indicated that a letter can be written up for the patient's son to provide his new employer that his mom is unexpectedly at this hospital and requires the patient's son's assistance in coordinating a safe discharge plan. CSW to type up letter for the patient's son and provide for his new job.  Laveda Abbe, [place] Clinical Social Worker [phone]
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
## 2017-08-25 NOTE — Progress Notes (Signed)
CSW met with patient and patient's son at bedside per RN request that patient's son wanted to discuss possible SNF placement. CSW discussed options with patient and patient's son, and obtained information that patient had recently discharged from [facility] on 06/29/17 after using up 100 days of rehab. CSW explained Medicare guidelines for the rehab benefit, and how there has to be a 60 day window outside of custodial care before the rehab benefit will reset; CSW indicated that SNF placement would likely be private pay, if the patient's son's information was correct. CSW will contact patient's insurance provider to verify information, and will follow up.  Laveda Abbe, LCSW Clinical Social Worker [phone]   15 5:18 PM:  CSW contacted patient's insurance provider, [employer], to verify information provided by patient's son. [employer] on call nurse confirmed that patient's rehab benefit has been depleted; insurance will not pay for rehab at this time, and patient will need a 60 day non-custodial period before the benefit will be available for the patient again.   CSW contacted patient's son to provide him with information. CSW discussed patient's son's frustrations and empathized with his concerns. Patient's son will be leaving for a job in [employer], and he says there's no one else available to help him out in caring for the patient. CSW attempted to get the patient's son to brainstorm of other available options that he may have as supports, but patient's son indicated that there was no one available. CSW provided information on placing in SNF under private pay, as well as taking the patient home with home health services and finding another caregiver. Patient's son indicated that none of that would be able to work for him.  CSW to follow up tomorrow on figuring out a discharge plan for the patient with the patient's son.  Laveda Abbe, [place] Clinical Social  Worker [phone]
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
## 2017-08-25 NOTE — Progress Notes (Signed)
     Subjective: 2 Days Post-Op Procedure(s) (LRB): CLOSED REDUCTION LEFT SHOULDER (Left) Awake, alert and oriented x 4. She has been transferring to bedside chair and W/C with assistance at home, not a long distance ambulator. RA. Weak left leg post thoracolumbar fusion.    Patient reports pain as moderate.    Objective:   VITALS:  Temp:  [97.4 F (36.3 C)-98.2 F (36.8 C)] 98 F (36.7 C) (10/28 0500) Pulse Rate:  [58-74] 58 (10/28 0500) Resp:  [16] 16 (10/28 0500) BP: (121-149)/(45-61) 149/61 (10/28 0500) SpO2:  [92 %-97 %] 97 % (10/28 0500)  Neurologically intact ABD soft Neurovascular intact Sensation intact distally Intact pulses distally Dorsiflexion/Plantar flexion intact Both shoulder appear located, xray of right shoulder with elevation of the left humeral head in glenoid consistent with chronic cuff pathology.   LABS  Recent Labs  08/23/17 1302  HGB 12.6    Recent Labs  08/23/17 1302  NA 140  K 4.2  GLUCOSE 107*   No results for input(s): LABPT, INR in the last 72 hours.   Assessment/Plan: 2 Days Post-Op Procedure(s) (LRB): CLOSED REDUCTION LEFT SHOULDER (Left)  Up with therapy Discharge to SNF when bed is available. Family not able to care for patient, will proceed with SNF placement. She has difficulty with care and management with multiple extremity pathology.  Basil Dess 08/25/2017, 10:30 AM Patient ID: Dominique Mckee, female   DOB: [birth year], 83 y.o.   MRN: [id number]

## 2017-08-26 DIAGNOSIS — M24412 Recurrent dislocation, left shoulder: Secondary | ICD-10-CM | POA: Diagnosis not present

## 2017-08-26 MED ORDER — ADULT MULTIVITAMIN W/MINERALS CH: 1.0000 | ORAL_TABLET | Freq: Every day | ORAL | 0 refills | Status: DC

## 2017-08-26 MED ORDER — ENSURE ENLIVE PO LIQD: 237.0000 mL | Freq: Three times a day (TID) | ORAL | 12 refills | Status: DC

## 2017-08-26 MED ORDER — ASPIRIN 81 MG PO TBEC: 81.0000 mg | DELAYED_RELEASE_TABLET | Freq: Two times a day (BID) | ORAL | 0 refills | Status: DC

## 2017-08-26 MED ORDER — ASPIRIN EC 81 MG PO TBEC
81.0000 mg | DELAYED_RELEASE_TABLET | Freq: Two times a day (BID) | ORAL | Status: DC
  Administered 2017-08-26 – 2017-08-27 (×3): 81 mg via ORAL
  Filled 2017-08-26 (×3): qty 1

## 2017-08-26 NOTE — Care Management (Signed)
Case manager spoke with [name], patient's son, concerning discharge. CM has explained that patient is ready for discharge. Mr. Kenton Kingfisher says that he and patient's granddaughter [place] are working on lining up care givers and he has a meeting scheduled for 08/27/17 to meet with someone from [place]. He says he also has [facility] coming for [place]. Mr. Kenton Kingfisher says he is aware that the are creating bills but He is working on getting his mom home. CM also contacted Dr. Marlou Sa with update.
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
## 2017-08-26 NOTE — Progress Notes (Signed)
Left shoulder reduced Ready for snf Add asa
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
## 2017-08-26 NOTE — Op Note (Signed)
NAME:  Dominique Mckee, Dominique Mckee                     ACCOUNT NO.:  MEDICAL RECORD NO.:  [id number]  LOCATION:                                 FACILITY:  PHYSICIAN:  Anderson Malta, M.D.    DATE OF BIRTH:  08-23-1930  DATE OF PROCEDURE: DATE OF DISCHARGE:                              OPERATIVE REPORT   PREOPERATIVE DIAGNOSIS:  Left shoulder dislocation.  POSTOPERATIVE DIAGNOSIS:  Left shoulder dislocation.  PROCEDURE:  Left shoulder reduction.  SURGEON:  Anderson Malta, M.D.  ASSIST:  None.  ANESTHESIA:  General.  INDICATIONS:  Dominique Mckee is an 83 year old female, who was in the emergency room about 2 weeks ago and had a shoulder dislocation, which was reduced by report.  She presented to the office last week with recurrent dislocation.  She presents now for operative evaluation and relocation.  PROCEDURE IN DETAIL:  The patient was brought to the operating room, where general anesthetic was induced.  Preoperative antibiotics were administered.  Time-out was called.  The patient with muscle relaxation on board was manipulated.  The engaging Hill-Sachs lesion was disengaged from the anterior portion of the glenoid.  Following this, the shoulder was manually reduced.  Shoulder was stable to about 45 degrees of external rotation and 90 degrees of abduction.  Fluoroscopy was used to confirm that the shoulder was reduced.  At this time, shoulder sling was applied.  She tolerated the procedure well without immediate complications and transferred to the recovery room in stable condition.     Anderson Malta, M.D.     GSD/MEDQ  D:  08/23/2017  T:  08/23/2017  Job:  [id number]

## 2017-08-27 DIAGNOSIS — G8911 Acute pain due to trauma: Secondary | ICD-10-CM | POA: Diagnosis not present

## 2017-08-27 DIAGNOSIS — M549 Dorsalgia, unspecified: Secondary | ICD-10-CM | POA: Diagnosis not present

## 2017-08-27 DIAGNOSIS — M24412 Recurrent dislocation, left shoulder: Secondary | ICD-10-CM | POA: Diagnosis not present

## 2017-08-27 DIAGNOSIS — S43085D Other dislocation of left shoulder joint, subsequent encounter: Secondary | ICD-10-CM | POA: Diagnosis not present

## 2017-08-27 NOTE — Progress Notes (Addendum)
Patient has met discharge criteria at this time. Discharge instructions discussed with patient in room and with patients son, Merry Proud, over the phone.  Patient and family verbalized understanding of instructions.  Paper prescriptions given to patient for following medications: Oxycodone, Hoyer lift.   Denies any further questions or concerns.  Patient discharged home via transport. Prescriptions, copy of discharge instructions, cell phone, and clothes transported with patient.  Instructed to call office with any further questions or concerns.

## 2017-08-27 NOTE — Care Management Note (Addendum)
Case Management Note  Patient Details  Name: Dominique Mckee MRN: [id number] Date of Birth: January 23, 1930  Subjective/Objective:                    Action/Plan:     Plan is to d/c to home today. Pt will be transported to home by [facility] [phone]). CM confirmed home addressed with Jeff(son).  08/27/2017 @ 1436 [facility] called and transportation to home arranged. CM made nurse aware. Nurse to inform son, Merry Proud.  Expected Discharge Date:  08/27/17               Expected Discharge Plan:  [place]  In-House Referral:     Discharge planning Services     Post Acute Care Choice:    Choice offered to:  Adult Children (JEFF)  DME Arranged:    DME Agency:     HH Arranged:   PT,OT, Nurse Aide [place] Agency:  [facility]  Status of Service:  Completed, signed off  If discussed at [employer], dates discussed:    Additional Comments:  Sharin Mons, RN 08/27/2017, 12:26 PM

## 2017-08-28 DIAGNOSIS — Z9181 History of falling: Secondary | ICD-10-CM | POA: Diagnosis not present

## 2017-08-28 DIAGNOSIS — F339 Major depressive disorder, recurrent, unspecified: Secondary | ICD-10-CM | POA: Diagnosis not present

## 2017-08-28 DIAGNOSIS — I5031 Acute diastolic (congestive) heart failure: Secondary | ICD-10-CM | POA: Diagnosis not present

## 2017-08-28 DIAGNOSIS — D631 Anemia in chronic kidney disease: Secondary | ICD-10-CM | POA: Diagnosis not present

## 2017-08-28 DIAGNOSIS — M6281 Muscle weakness (generalized): Secondary | ICD-10-CM | POA: Diagnosis not present

## 2017-08-28 DIAGNOSIS — S43005D Unspecified dislocation of left shoulder joint, subsequent encounter: Secondary | ICD-10-CM | POA: Diagnosis not present

## 2017-08-28 DIAGNOSIS — N3289 Other specified disorders of bladder: Secondary | ICD-10-CM | POA: Diagnosis not present

## 2017-08-28 DIAGNOSIS — N189 Chronic kidney disease, unspecified: Secondary | ICD-10-CM | POA: Diagnosis not present

## 2017-08-28 DIAGNOSIS — Z79891 Long term (current) use of opiate analgesic: Secondary | ICD-10-CM | POA: Diagnosis not present

## 2017-08-28 DIAGNOSIS — D649 Anemia, unspecified: Secondary | ICD-10-CM | POA: Diagnosis not present

## 2017-08-28 DIAGNOSIS — B353 Tinea pedis: Secondary | ICD-10-CM | POA: Diagnosis not present

## 2017-08-28 DIAGNOSIS — G43009 Migraine without aura, not intractable, without status migrainosus: Secondary | ICD-10-CM | POA: Diagnosis not present

## 2017-08-28 DIAGNOSIS — Z981 Arthrodesis status: Secondary | ICD-10-CM | POA: Diagnosis not present

## 2017-08-28 DIAGNOSIS — I130 Hypertensive heart and chronic kidney disease with heart failure and stage 1 through stage 4 chronic kidney disease, or unspecified chronic kidney disease: Secondary | ICD-10-CM | POA: Diagnosis not present

## 2017-08-28 DIAGNOSIS — Z4789 Encounter for other orthopedic aftercare: Secondary | ICD-10-CM | POA: Diagnosis not present

## 2017-08-28 DIAGNOSIS — M47812 Spondylosis without myelopathy or radiculopathy, cervical region: Secondary | ICD-10-CM | POA: Diagnosis not present

## 2017-08-28 DIAGNOSIS — G894 Chronic pain syndrome: Secondary | ICD-10-CM | POA: Diagnosis not present

## 2017-08-28 DIAGNOSIS — I129 Hypertensive chronic kidney disease with stage 1 through stage 4 chronic kidney disease, or unspecified chronic kidney disease: Secondary | ICD-10-CM | POA: Diagnosis not present

## 2017-08-28 DIAGNOSIS — M48062 Spinal stenosis, lumbar region with neurogenic claudication: Secondary | ICD-10-CM | POA: Diagnosis not present

## 2017-08-28 DIAGNOSIS — M069 Rheumatoid arthritis, unspecified: Secondary | ICD-10-CM | POA: Diagnosis not present

## 2017-08-28 DIAGNOSIS — M4135 Thoracogenic scoliosis, thoracolumbar region: Secondary | ICD-10-CM | POA: Diagnosis not present

## 2017-08-30 ENCOUNTER — Inpatient Hospital Stay (INDEPENDENT_AMBULATORY_CARE_PROVIDER_SITE_OTHER): Payer: PPO | Admitting: Orthopedic Surgery

## 2017-08-30 DIAGNOSIS — M48062 Spinal stenosis, lumbar region with neurogenic claudication: Secondary | ICD-10-CM | POA: Diagnosis not present

## 2017-08-30 DIAGNOSIS — N189 Chronic kidney disease, unspecified: Secondary | ICD-10-CM | POA: Diagnosis not present

## 2017-08-30 DIAGNOSIS — S43005D Unspecified dislocation of left shoulder joint, subsequent encounter: Secondary | ICD-10-CM | POA: Diagnosis not present

## 2017-08-30 DIAGNOSIS — I129 Hypertensive chronic kidney disease with stage 1 through stage 4 chronic kidney disease, or unspecified chronic kidney disease: Secondary | ICD-10-CM | POA: Diagnosis not present

## 2017-08-30 DIAGNOSIS — Z981 Arthrodesis status: Secondary | ICD-10-CM | POA: Diagnosis not present

## 2017-08-30 DIAGNOSIS — I5031 Acute diastolic (congestive) heart failure: Secondary | ICD-10-CM | POA: Diagnosis not present

## 2017-08-30 DIAGNOSIS — G43009 Migraine without aura, not intractable, without status migrainosus: Secondary | ICD-10-CM | POA: Diagnosis not present

## 2017-08-30 DIAGNOSIS — Z9181 History of falling: Secondary | ICD-10-CM | POA: Diagnosis not present

## 2017-08-30 DIAGNOSIS — D631 Anemia in chronic kidney disease: Secondary | ICD-10-CM | POA: Diagnosis not present

## 2017-08-30 DIAGNOSIS — F339 Major depressive disorder, recurrent, unspecified: Secondary | ICD-10-CM | POA: Diagnosis not present

## 2017-08-30 DIAGNOSIS — M47812 Spondylosis without myelopathy or radiculopathy, cervical region: Secondary | ICD-10-CM | POA: Diagnosis not present

## 2017-08-30 DIAGNOSIS — M069 Rheumatoid arthritis, unspecified: Secondary | ICD-10-CM | POA: Diagnosis not present

## 2017-08-30 DIAGNOSIS — Z79891 Long term (current) use of opiate analgesic: Secondary | ICD-10-CM | POA: Diagnosis not present

## 2017-08-30 DIAGNOSIS — B353 Tinea pedis: Secondary | ICD-10-CM | POA: Diagnosis not present

## 2017-08-30 DIAGNOSIS — M4135 Thoracogenic scoliosis, thoracolumbar region: Secondary | ICD-10-CM | POA: Diagnosis not present

## 2017-08-30 DIAGNOSIS — I130 Hypertensive heart and chronic kidney disease with heart failure and stage 1 through stage 4 chronic kidney disease, or unspecified chronic kidney disease: Secondary | ICD-10-CM | POA: Diagnosis not present

## 2017-09-02 DIAGNOSIS — M48062 Spinal stenosis, lumbar region with neurogenic claudication: Secondary | ICD-10-CM | POA: Diagnosis not present

## 2017-09-02 DIAGNOSIS — B353 Tinea pedis: Secondary | ICD-10-CM | POA: Diagnosis not present

## 2017-09-02 DIAGNOSIS — D631 Anemia in chronic kidney disease: Secondary | ICD-10-CM | POA: Diagnosis not present

## 2017-09-02 DIAGNOSIS — Z79891 Long term (current) use of opiate analgesic: Secondary | ICD-10-CM | POA: Diagnosis not present

## 2017-09-02 DIAGNOSIS — I5031 Acute diastolic (congestive) heart failure: Secondary | ICD-10-CM | POA: Diagnosis not present

## 2017-09-02 DIAGNOSIS — M4135 Thoracogenic scoliosis, thoracolumbar region: Secondary | ICD-10-CM | POA: Diagnosis not present

## 2017-09-02 DIAGNOSIS — G43009 Migraine without aura, not intractable, without status migrainosus: Secondary | ICD-10-CM | POA: Diagnosis not present

## 2017-09-02 DIAGNOSIS — S43005D Unspecified dislocation of left shoulder joint, subsequent encounter: Secondary | ICD-10-CM | POA: Diagnosis not present

## 2017-09-02 DIAGNOSIS — Z9181 History of falling: Secondary | ICD-10-CM | POA: Diagnosis not present

## 2017-09-02 DIAGNOSIS — M47812 Spondylosis without myelopathy or radiculopathy, cervical region: Secondary | ICD-10-CM | POA: Diagnosis not present

## 2017-09-02 DIAGNOSIS — Z981 Arthrodesis status: Secondary | ICD-10-CM | POA: Diagnosis not present

## 2017-09-02 DIAGNOSIS — I130 Hypertensive heart and chronic kidney disease with heart failure and stage 1 through stage 4 chronic kidney disease, or unspecified chronic kidney disease: Secondary | ICD-10-CM | POA: Diagnosis not present

## 2017-09-02 DIAGNOSIS — M069 Rheumatoid arthritis, unspecified: Secondary | ICD-10-CM | POA: Diagnosis not present

## 2017-09-02 DIAGNOSIS — N189 Chronic kidney disease, unspecified: Secondary | ICD-10-CM | POA: Diagnosis not present

## 2017-09-02 DIAGNOSIS — I129 Hypertensive chronic kidney disease with stage 1 through stage 4 chronic kidney disease, or unspecified chronic kidney disease: Secondary | ICD-10-CM | POA: Diagnosis not present

## 2017-09-02 DIAGNOSIS — F339 Major depressive disorder, recurrent, unspecified: Secondary | ICD-10-CM | POA: Diagnosis not present

## 2017-09-04 ENCOUNTER — Encounter (INDEPENDENT_AMBULATORY_CARE_PROVIDER_SITE_OTHER): Payer: Self-pay | Admitting: Orthopedic Surgery

## 2017-09-04 ENCOUNTER — Ambulatory Visit (INDEPENDENT_AMBULATORY_CARE_PROVIDER_SITE_OTHER): Payer: PPO

## 2017-09-04 ENCOUNTER — Ambulatory Visit (INDEPENDENT_AMBULATORY_CARE_PROVIDER_SITE_OTHER): Payer: PPO | Admitting: Orthopedic Surgery

## 2017-09-04 DIAGNOSIS — M25511 Pain in right shoulder: Secondary | ICD-10-CM | POA: Diagnosis not present

## 2017-09-04 DIAGNOSIS — M24412 Recurrent dislocation, left shoulder: Secondary | ICD-10-CM

## 2017-09-04 DIAGNOSIS — G8929 Other chronic pain: Secondary | ICD-10-CM | POA: Diagnosis not present

## 2017-09-04 DIAGNOSIS — M19011 Primary osteoarthritis, right shoulder: Secondary | ICD-10-CM | POA: Diagnosis not present

## 2017-09-04 MED ORDER — METHYLPREDNISOLONE ACETATE 40 MG/ML IJ SUSP
40.0000 mg | INTRAMUSCULAR | Status: AC | PRN
  Administered 2017-09-04: 40 mg via INTRA_ARTICULAR

## 2017-09-04 MED ORDER — BUPIVACAINE HCL 0.5 % IJ SOLN
9.0000 mL | INTRAMUSCULAR | Status: AC | PRN
  Administered 2017-09-04: 9 mL via INTRA_ARTICULAR

## 2017-09-04 MED ORDER — LIDOCAINE HCL 1 % IJ SOLN
5.0000 mL | INTRAMUSCULAR | Status: AC | PRN
  Administered 2017-09-04: 5 mL

## 2017-09-04 NOTE — Progress Notes (Signed)
Office Visit Note   Patient: Dominique Mckee           Date of Birth: [birth year]           MRN: [id number] Visit Date: 09/04/2017 Requested by: Wenda Low, MD [address] [place], [place] [address] PCP: Wenda Low, MD  Subjective: Chief Complaint  Patient presents with  . Left Shoulder - Routine Post Op    HPI: Dominique Mckee is an 83 year old patient with left shoulder reduction 1 week out.  She has been in a sling.  She also reports right shoulder pain and some left trochanteric pain.  Patient describes having prior injection into the left trochanteric bursa which helped.  She is also reporting some right shoulder pain.  She had right shoulder reduction 2 years ago and is in reasonably well with that.              ROS: All systems reviewed are negative as they relate to the chief complaint within the history of present illness.  Patient denies  fevers or chills.   Assessment & Plan: Visit Diagnoses:  1. Arthritis of right shoulder region   2. Recurrent dislocation of left shoulder   3. Chronic right shoulder pain     Plan: Impression is bilateral reduced shoulders.  Both shoulders have superior migration of the humeral head consistent with rotator cuff arthropathy.  No evidence of instability in either shoulder at this time.  I do want to inject the right shoulder today.  We will consider left trochanteric injection in 2 and half weeks and we see Dominique Mckee back to begin left shoulder mobilization.  Follow-Up Instructions: No Follow-up on file.   Orders:  Orders Placed This Encounter  Procedures  . XR Shoulder Left  . XR Shoulder Right   No orders of the defined types were placed in this encounter.     Procedures: Large Joint Inj: R glenohumeral on 09/04/2017 11:37 AM Indications: diagnostic evaluation and pain Details: 18 G 1.5 in needle, posterior approach  Arthrogram: No  Medications: 9 mL bupivacaine 0.5 %; 40 mg methylPREDNISolone acetate 40 MG/ML; 5  mL lidocaine 1 % Outcome: tolerated well, no immediate complications Procedure, treatment alternatives, risks and benefits explained, specific risks discussed. Consent was given by the patient. Immediately prior to procedure a time out was called to verify the correct patient, procedure, equipment, support staff and site/side marked as required. Patient was prepped and draped in the usual sterile fashion.       Clinical Data: No additional findings.  Objective: Vital Signs: There were no vitals taken for this visit.  Physical Exam:   Constitutional: Patient appears well-developed HEENT:  Head: Normocephalic Eyes:EOM are normal Neck: Normal range of motion Cardiovascular: Normal rate Pulmonary/chest: Effort normal Neurologic: Patient is alert Skin: Skin is warm Psychiatric: Patient has normal mood and affect    Ortho Exam: Bilateral arm exam demonstrates palpable radial pulses.  Both shoulders are reduced.  Deltoid fires bilaterally.  There is a little bit of coarseness associated with passive shoulder range of motion on both sides.  Elbow wrist range of motion intact.  Specialty Comments:  No specialty comments available.  Imaging: Xr Shoulder Left  Result Date: 09/04/2017 AP outlet left shoulder reviewed.  Shoulder remains located.  There is some superior migration of the humeral head.  The visualized lung fields clear.  Previous dislocation has been reduced  Xr Shoulder Right  Result Date: 09/04/2017 AP outlet right shoulder reviewed.  Shoulder  is located.  Rotator cuff arthropathy is present with superior migration of the humeral head.  Visualized lung fields clear.    PMFS History: Patient Active Problem List   Diagnosis Date Noted  . Acute diastolic congestive heart failure ([place]) 07/08/2015  . Acute respiratory failure with hypoxia ([place]) 07/08/2015  . Shoulder dislocation, recurrent 07/06/2015  . HTN (hypertension) 07/06/2015  . Rheumatoid arthritis ([place])  07/06/2015  . GERD (gastroesophageal reflux disease) 07/06/2015  . Shoulder dislocation   . Concussion with no loss of consciousness 12/30/2013  . Cervical spondylosis without myelopathy 01/01/2013  . Migraine without aura 01/01/2013  . Abnormality of gait 01/01/2013  . Essential and other specified forms of tremor 01/01/2013   Past Medical History:  Diagnosis Date  . Arthritis   . Benign essential tremor   . Cervical spondylosis   . Cervicogenic headache   . CHF (congestive heart failure) ([place])   . Chronic renal insufficiency   . Degenerative disc disease   . Depression   . Fibromyalgia   . Gait disorder   . GERD (gastroesophageal reflux disease)   . Heart murmur   . HOH (hard of hearing)   . Hypertension   . Lumbar spondylosis   . Migraine headache   . Mild obesity   . Osteopenia   . Rheumatoid arthritis ([place])     Family History  Problem Relation Age of Onset  . Osteoporosis Mother   . Intracerebral hemorrhage Mother 55  . AAA (abdominal aortic aneurysm) Father 68  . Lung cancer Brother   . Diabetes Brother   . Lung cancer Brother   . Diabetes Sister   . Lung cancer Sister   . Bone cancer Sister   . Kidney failure Son        left kidney transplant  . Diabetes type I Daughter   . Anesthesia problems Neg Hx   . Hypertension Neg Hx     Past Surgical History:  Procedure Laterality Date  . ABDOMINAL HYSTERECTOMY    . APPENDECTOMY    . CHOLECYSTECTOMY    . COLONOSCOPY    . ELBOW SURGERY Left   . HAMMER TOE SURGERY Left   . LUMBAR FUSION  02/2017  . TONSILLECTOMY    . TOTAL KNEE ARTHROPLASTY Right    right   Social History   Occupational History  . Occupation: Retired    Fish farm manager: RETIRED  Tobacco Use  . Smoking status: Never Smoker  . Smokeless tobacco: Never Used  Substance and Sexual Activity  . Alcohol use: No  . Drug use: No  . Sexual activity: Not Currently

## 2017-09-09 DIAGNOSIS — S43005D Unspecified dislocation of left shoulder joint, subsequent encounter: Secondary | ICD-10-CM | POA: Diagnosis not present

## 2017-09-09 DIAGNOSIS — M47812 Spondylosis without myelopathy or radiculopathy, cervical region: Secondary | ICD-10-CM | POA: Diagnosis not present

## 2017-09-09 DIAGNOSIS — D631 Anemia in chronic kidney disease: Secondary | ICD-10-CM | POA: Diagnosis not present

## 2017-09-09 DIAGNOSIS — I130 Hypertensive heart and chronic kidney disease with heart failure and stage 1 through stage 4 chronic kidney disease, or unspecified chronic kidney disease: Secondary | ICD-10-CM | POA: Diagnosis not present

## 2017-09-09 DIAGNOSIS — I129 Hypertensive chronic kidney disease with stage 1 through stage 4 chronic kidney disease, or unspecified chronic kidney disease: Secondary | ICD-10-CM | POA: Diagnosis not present

## 2017-09-09 DIAGNOSIS — Z79891 Long term (current) use of opiate analgesic: Secondary | ICD-10-CM | POA: Diagnosis not present

## 2017-09-09 DIAGNOSIS — G43009 Migraine without aura, not intractable, without status migrainosus: Secondary | ICD-10-CM | POA: Diagnosis not present

## 2017-09-09 DIAGNOSIS — M48062 Spinal stenosis, lumbar region with neurogenic claudication: Secondary | ICD-10-CM | POA: Diagnosis not present

## 2017-09-09 DIAGNOSIS — Z9181 History of falling: Secondary | ICD-10-CM | POA: Diagnosis not present

## 2017-09-09 DIAGNOSIS — F339 Major depressive disorder, recurrent, unspecified: Secondary | ICD-10-CM | POA: Diagnosis not present

## 2017-09-09 DIAGNOSIS — N189 Chronic kidney disease, unspecified: Secondary | ICD-10-CM | POA: Diagnosis not present

## 2017-09-09 DIAGNOSIS — Z981 Arthrodesis status: Secondary | ICD-10-CM | POA: Diagnosis not present

## 2017-09-09 DIAGNOSIS — M069 Rheumatoid arthritis, unspecified: Secondary | ICD-10-CM | POA: Diagnosis not present

## 2017-09-09 DIAGNOSIS — M4135 Thoracogenic scoliosis, thoracolumbar region: Secondary | ICD-10-CM | POA: Diagnosis not present

## 2017-09-09 DIAGNOSIS — I5031 Acute diastolic (congestive) heart failure: Secondary | ICD-10-CM | POA: Diagnosis not present

## 2017-09-09 DIAGNOSIS — B353 Tinea pedis: Secondary | ICD-10-CM | POA: Diagnosis not present

## 2017-09-11 DIAGNOSIS — M542 Cervicalgia: Secondary | ICD-10-CM | POA: Diagnosis not present

## 2017-09-11 DIAGNOSIS — Z6835 Body mass index (BMI) 35.0-35.9, adult: Secondary | ICD-10-CM | POA: Diagnosis not present

## 2017-09-11 DIAGNOSIS — I10 Essential (primary) hypertension: Secondary | ICD-10-CM | POA: Diagnosis not present

## 2017-09-11 DIAGNOSIS — G894 Chronic pain syndrome: Secondary | ICD-10-CM | POA: Diagnosis not present

## 2017-09-13 ENCOUNTER — Encounter (HOSPITAL_COMMUNITY): Payer: Self-pay | Admitting: Obstetrics and Gynecology

## 2017-09-13 ENCOUNTER — Emergency Department (HOSPITAL_COMMUNITY)
Admission: EM | Admit: 2017-09-13 | Discharge: 2017-09-13 | Disposition: A | Discharge status: Home / Self Care | Payer: PPO | Attending: Emergency Medicine | Admitting: Emergency Medicine

## 2017-09-13 ENCOUNTER — Other Ambulatory Visit: Payer: Self-pay

## 2017-09-13 ENCOUNTER — Emergency Department (HOSPITAL_COMMUNITY): Payer: PPO

## 2017-09-13 DIAGNOSIS — S4990XA Unspecified injury of shoulder and upper arm, unspecified arm, initial encounter: Secondary | ICD-10-CM | POA: Diagnosis not present

## 2017-09-13 DIAGNOSIS — B999 Unspecified infectious disease: Secondary | ICD-10-CM | POA: Diagnosis not present

## 2017-09-13 DIAGNOSIS — N63 Unspecified lump in unspecified breast: Secondary | ICD-10-CM | POA: Insufficient documentation

## 2017-09-13 DIAGNOSIS — I130 Hypertensive heart and chronic kidney disease with heart failure and stage 1 through stage 4 chronic kidney disease, or unspecified chronic kidney disease: Secondary | ICD-10-CM | POA: Insufficient documentation

## 2017-09-13 DIAGNOSIS — N6310 Unspecified lump in the right breast, unspecified quadrant: Secondary | ICD-10-CM | POA: Diagnosis not present

## 2017-09-13 DIAGNOSIS — Z885 Allergy status to narcotic agent status: Secondary | ICD-10-CM | POA: Diagnosis not present

## 2017-09-13 DIAGNOSIS — I5032 Chronic diastolic (congestive) heart failure: Secondary | ICD-10-CM | POA: Insufficient documentation

## 2017-09-13 DIAGNOSIS — N189 Chronic kidney disease, unspecified: Secondary | ICD-10-CM | POA: Insufficient documentation

## 2017-09-13 DIAGNOSIS — M797 Fibromyalgia: Secondary | ICD-10-CM | POA: Insufficient documentation

## 2017-09-13 DIAGNOSIS — I517 Cardiomegaly: Secondary | ICD-10-CM | POA: Diagnosis not present

## 2017-09-13 DIAGNOSIS — N6311 Unspecified lump in the right breast, upper outer quadrant: Secondary | ICD-10-CM | POA: Diagnosis present

## 2017-09-13 DIAGNOSIS — S299XXA Unspecified injury of thorax, initial encounter: Secondary | ICD-10-CM | POA: Diagnosis not present

## 2017-09-13 NOTE — Discharge Instructions (Signed)
You were seen in the ED today with a breast mass. The chest x-ray was normal but you will need further evaluation of this area. I have attached the contact information of the breast imaging center which you should call on Monday to schedule an ultrasound. Your PCP can also help you to set up this appointment. You should call them as well.   Return to the ED with any worsening pain, fever, chills, or difficulty breathing. Take Tylenol for any pain and apply a warm compress to the area to help relieve symptoms.

## 2017-09-13 NOTE — ED Notes (Signed)
[facility] called for Transport 

## 2017-09-13 NOTE — ED Provider Notes (Signed)
Emergency Department Provider Note   I have reviewed the triage vital signs and the nursing notes.   HISTORY  Chief Complaint Breast Mass   HPI Dominique Mckee is a 83 y.o. female with PMH of CHF, CKD, Fibromyalgia, RA, and HTN resents to the emergency department for evaluation of a painful right upper breast mass discovered yesterday. Patient denies any falls or injury to the chest wall. No fever or chills. No redness or drainage from the mass. Patient reports having a normal mammogram earlier this year. No additional masses.    Past Medical History:  Diagnosis Date  . Arthritis   . Benign essential tremor   . Cervical spondylosis   . Cervicogenic headache   . CHF (congestive heart failure) ([place])   . Chronic renal insufficiency   . Degenerative disc disease   . Depression   . Fibromyalgia   . Gait disorder   . GERD (gastroesophageal reflux disease)   . Heart murmur   . HOH (hard of hearing)   . Hypertension   . Lumbar spondylosis   . Migraine headache   . Mild obesity   . Osteopenia   . Rheumatoid arthritis [facility])     Patient Active Problem List   Diagnosis Date Noted  . Acute diastolic congestive heart failure (Dominique Mckee) 07/08/2015  . Acute respiratory failure with hypoxia ([place]) 07/08/2015  . Shoulder dislocation, recurrent 07/06/2015  . HTN (hypertension) 07/06/2015  . Rheumatoid arthritis ([place]) 07/06/2015  . GERD (gastroesophageal reflux disease) 07/06/2015  . Shoulder dislocation   . Concussion with no loss of consciousness 12/30/2013  . Cervical spondylosis without myelopathy 01/01/2013  . Migraine without aura 01/01/2013  . Abnormality of gait 01/01/2013  . Essential and other specified forms of tremor 01/01/2013    Past Surgical History:  Procedure Laterality Date  . ABDOMINAL HYSTERECTOMY    . APPENDECTOMY    . CHOLECYSTECTOMY    . CLOSED REDUCTION LEFT SHOULDER Left 08/23/2017   Performed by Meredith Pel, MD at [facility]    .  ELBOW SURGERY Left   . HAMMER TOE SURGERY Left   . LUMBAR FUSION  02/2017  . RIGHT SHOULDER CLOSED REDUCTION Right 07/08/2015   Performed by Meredith Pel, MD at [facility]  . TONSILLECTOMY    . TOTAL KNEE ARTHROPLASTY Right    right    Current Outpatient Rx  . Order #: [id number] Class: Historical Med  . Order #: [id number] Class: Normal  . Order #: [id number] Class: Historical Med  . Order #: [id number] Class: Historical Med  . Order #: [id number] Class: Historical Med  . Order #: [id number] Class: Normal  . Order #: [id number] Class: Historical Med  . Order #: [id number] Class: Historical Med  . Order #: [id number] Class: Historical Med  . Order #: [id number] Class: Print  . Order #: [id number] Class: Historical Med  . Order #: [id number] Class: Historical Med  . Order #: [id number] Class: Normal  . Order #: [id number] Class: Normal  . Order #: [id number] Class: Normal    Allergies Feldene [piroxicam]; Clinoril [sulindac]; and Morphine and related  Family History  Problem Relation Age of Onset  . Osteoporosis Mother   . Intracerebral hemorrhage Mother 34  . AAA (abdominal aortic aneurysm) Father 53  . Lung cancer Brother   . Diabetes Brother   . Lung cancer Brother   . Diabetes Sister   . Lung cancer Sister   . Bone cancer Sister   . Kidney failure Son        left kidney transplant  .  Diabetes type I Daughter   . Anesthesia problems Neg Hx   . Hypertension Neg Hx     Social History Social History   Tobacco Use  . Smoking status: Never Smoker  . Smokeless tobacco: Never Used  Substance Use Topics  . Alcohol use: No  . Drug use: No    Review of Systems  Constitutional: No fever/chills Eyes: No visual changes. ENT: No sore throat. Cardiovascular: Positive chest wall pain.  Respiratory: Denies shortness of breath. Gastrointestinal: No abdominal pain.  No nausea, no vomiting.  No diarrhea.  No constipation. Genitourinary: Negative for dysuria. Musculoskeletal: Negative for back  pain. Skin: Negative for rash. Positive right breast mass.  Neurological: Negative for headaches, focal weakness or numbness.  10-point ROS otherwise negative.  ____________________________________________   PHYSICAL EXAM:  VITAL SIGNS: ED Triage Vitals [09/13/17 1531]  Enc Vitals Group     BP 109/68     Pulse Rate (!) 57     Resp      Temp 98.6 F (37 C)     Temp Source Oral     SpO2 96 %     Weight 137 lb (62.1 kg)     Height 5\' 3"  (1.6 m)    Constitutional: Alert and oriented. Well appearing and in no acute distress. Eyes: Conjunctivae are normal.  Head: Atraumatic. Nose: No congestion/rhinnorhea. Mouth/Throat: Mucous membranes are moist.  Neck: No stridor. Cardiovascular: Normal rate, regular rhythm. Good peripheral circulation. Grossly normal heart sounds.   Respiratory: Normal respiratory effort.  No retractions. Lungs CTAB. Gastrointestinal: Soft and nontender. No distention.  Musculoskeletal: No lower extremity tenderness nor edema. No gross deformities of extremities. Neurologic:  Normal speech and language. No gross focal neurologic deficits are appreciated.  Skin:  Skin is warm, dry and intact. Palpable mass on the upper right breast. No ecchymosis over the mass but some noted in the adjacent axilla. No crepitus. No overlying cellulitis or fluctuance.   ____________________________________________   PROCEDURES  Procedure(s) performed:   Procedures  None ____________________________________________   INITIAL IMPRESSION / ASSESSMENT AND PLAN / ED COURSE  Pertinent labs & imaging results that were available during my care of the patient were reviewed by me and considered in my medical decision making (see chart for details).  Patient presents to the ED for evaluation of breast mass. CXR with no bony abnormality. Some tenderness in the area but no evidence if abscess or infection on exam. Will refer back to PCP and suggest imaging at the breast center.  [place] will likely be most helpful.   At this time, I do not feel there is any life-threatening condition present. I have reviewed and discussed all results (EKG, imaging, lab, urine as appropriate), exam findings with patient. I have reviewed nursing notes and appropriate previous records.  I feel the patient is safe to be discharged home without further emergent workup. Discussed usual and customary return precautions. Patient and family (if present) verbalize understanding and are comfortable with this plan.  Patient will follow-up with their primary care provider. If they do not have a primary care provider, information for follow-up has been provided to them. All questions have been answered.    ____________________________________________  FINAL CLINICAL IMPRESSION(S) / ED DIAGNOSES  Final diagnoses:  Breast mass    Note:  This document was prepared using Dragon voice recognition software and may include unintentional dictation errors.  Nanda Quinton, MD Emergency Medicine    Long, [facility], MD 09/13/17 [phone]

## 2017-09-13 NOTE — ED Triage Notes (Signed)
Per EMS:  Pt is coming from home and found a lump on her right breast and wanted to get it seen. Pt saw her PCP yesterday but did not fid the lump until last night.

## 2017-09-16 DIAGNOSIS — Z79891 Long term (current) use of opiate analgesic: Secondary | ICD-10-CM | POA: Diagnosis not present

## 2017-09-16 DIAGNOSIS — Z981 Arthrodesis status: Secondary | ICD-10-CM | POA: Diagnosis not present

## 2017-09-16 DIAGNOSIS — F339 Major depressive disorder, recurrent, unspecified: Secondary | ICD-10-CM | POA: Diagnosis not present

## 2017-09-16 DIAGNOSIS — M4135 Thoracogenic scoliosis, thoracolumbar region: Secondary | ICD-10-CM | POA: Diagnosis not present

## 2017-09-16 DIAGNOSIS — N189 Chronic kidney disease, unspecified: Secondary | ICD-10-CM | POA: Diagnosis not present

## 2017-09-16 DIAGNOSIS — D631 Anemia in chronic kidney disease: Secondary | ICD-10-CM | POA: Diagnosis not present

## 2017-09-16 DIAGNOSIS — Z9181 History of falling: Secondary | ICD-10-CM | POA: Diagnosis not present

## 2017-09-16 DIAGNOSIS — M069 Rheumatoid arthritis, unspecified: Secondary | ICD-10-CM | POA: Diagnosis not present

## 2017-09-16 DIAGNOSIS — M48062 Spinal stenosis, lumbar region with neurogenic claudication: Secondary | ICD-10-CM | POA: Diagnosis not present

## 2017-09-16 DIAGNOSIS — I129 Hypertensive chronic kidney disease with stage 1 through stage 4 chronic kidney disease, or unspecified chronic kidney disease: Secondary | ICD-10-CM | POA: Diagnosis not present

## 2017-09-18 ENCOUNTER — Ambulatory Visit (INDEPENDENT_AMBULATORY_CARE_PROVIDER_SITE_OTHER): Payer: PPO

## 2017-09-18 ENCOUNTER — Ambulatory Visit (INDEPENDENT_AMBULATORY_CARE_PROVIDER_SITE_OTHER): Payer: PPO | Admitting: Orthopedic Surgery

## 2017-09-18 ENCOUNTER — Encounter (INDEPENDENT_AMBULATORY_CARE_PROVIDER_SITE_OTHER): Payer: Self-pay | Admitting: Orthopedic Surgery

## 2017-09-18 DIAGNOSIS — M25511 Pain in right shoulder: Secondary | ICD-10-CM | POA: Diagnosis not present

## 2017-09-18 NOTE — Progress Notes (Signed)
Office Visit Note   Patient: Dominique Mckee           Date of Birth: 05-Jul-1930           MRN: [id number] Visit Date: 09/18/2017 Requested by: Wenda Low, MD [address] [place], [place] [address] PCP: Wenda Low, MD  Subjective: Chief Complaint  Patient presents with  . Left Shoulder - Follow-up    HPI: Dominique Mckee comes in for follow-up of left shoulder closed reduction and possible new right shoulder injury today.  She had right shoulder injection done 3 weeks ago.  She was seen in the emergency room several days ago for possible right shoulder injury.  Radiographs negative at that time.  She subsequently had development of some bruising in the mid humeral region of that right arm.  She reports that she was holding onto a paramedic and felt a pop or pull in that right arm.              ROS: All systems reviewed are negative as they relate to the chief complaint within the history of present illness.  Patient denies  fevers or chills.   Assessment & Plan: Visit Diagnoses:  1. Acute pain of right shoulder     Plan: Impression is acute soft tissue injury right shoulder region.  Not sure if this is biceps tendon rupture without retraction or pectoralis major tendon rupture.  The shoulder itself is located on exam.  He is also on radiographs the right shoulder is located.  The left shoulder also remains located on exam.  At this point we can discontinue the use of the sling.  I will see her back in 3 months and we will recheck the right shoulder area for soft tissue injury and resolution of this "mass" in the upper quadrant of that right axilla region.  Follow-Up Instructions: Return in about 3 months (around 12/19/2017).   Orders:  Orders Placed This Encounter  Procedures  . XR Shoulder Right   No orders of the defined types were placed in this encounter.     Procedures: No procedures performed   Clinical Data: No additional findings.  Objective: Vital  Signs: There were no vitals taken for this visit.  Physical Exam:   Constitutional: Patient appears well-developed HEENT:  Head: Normocephalic Eyes:EOM are normal Neck: Normal range of motion Cardiovascular: Normal rate Pulmonary/chest: Effort normal Neurologic: Patient is alert Skin: Skin is warm Psychiatric: Patient has normal mood and affect    Ortho Exam: Orthopedic exam demonstrates location of both shoulders but with some coarseness with passive range of motion.  Deltoid fires bilaterally.  Patient has about 70 of passive abduction bilaterally.  External rotation on both sides is also to about 50.  There is some ecchymosis and bruising in the mid humeral region on the right but no definite Popeye deformity..  Patient has some tenderness and possible mass in that upper right axillary region.  This is encompassed within the ecchymosis.  Motor sensory function to the right hand is intact.  Specialty Comments:  No specialty comments available.  Imaging: Xr Shoulder Right  Result Date: 09/18/2017 Multiple views right shoulder reviewed.  Rotator cuff arthropathy is present.  Shoulder is located.  There is slight superior and anterior subluxation but no dislocation.  No acute fracture.    PMFS History: Patient Active Problem List   Diagnosis Date Noted  . Acute diastolic congestive heart failure ([place]) 07/08/2015  . Acute respiratory failure with hypoxia (  [place]) 07/08/2015  . Shoulder dislocation, recurrent 07/06/2015  . HTN (hypertension) 07/06/2015  . Rheumatoid arthritis ([place]) 07/06/2015  . GERD (gastroesophageal reflux disease) 07/06/2015  . Shoulder dislocation   . Concussion with no loss of consciousness 12/30/2013  . Cervical spondylosis without myelopathy 01/01/2013  . Migraine without aura 01/01/2013  . Abnormality of gait 01/01/2013  . Essential and other specified forms of tremor 01/01/2013   Past Medical History:  Diagnosis Date  . Arthritis   . Benign  essential tremor   . Cervical spondylosis   . Cervicogenic headache   . CHF (congestive heart failure) ([place])   . Chronic renal insufficiency   . Degenerative disc disease   . Depression   . Fibromyalgia   . Gait disorder   . GERD (gastroesophageal reflux disease)   . Heart murmur   . HOH (hard of hearing)   . Hypertension   . Lumbar spondylosis   . Migraine headache   . Mild obesity   . Osteopenia   . Rheumatoid arthritis ([place])     Family History  Problem Relation Age of Onset  . Osteoporosis Mother   . Intracerebral hemorrhage Mother 76  . AAA (abdominal aortic aneurysm) Father 39  . Lung cancer Brother   . Diabetes Brother   . Lung cancer Brother   . Diabetes Sister   . Lung cancer Sister   . Bone cancer Sister   . Kidney failure Son        left kidney transplant  . Diabetes type I Daughter   . Anesthesia problems Neg Hx   . Hypertension Neg Hx     Past Surgical History:  Procedure Laterality Date  . ABDOMINAL HYSTERECTOMY    . APPENDECTOMY    . CHOLECYSTECTOMY    . COLONOSCOPY    . ELBOW SURGERY Left   . HAMMER TOE SURGERY Left   . LUMBAR FUSION  02/2017  . SHOULDER CLOSED REDUCTION Right 07/08/2015   Procedure: RIGHT SHOULDER CLOSED REDUCTION;  Surgeon: Meredith Pel, MD;  Location: [facility];  Service: Orthopedics;  Laterality: Right;  . SHOULDER CLOSED REDUCTION Left 08/23/2017   Procedure: CLOSED REDUCTION LEFT SHOULDER;  Surgeon: Meredith Pel, MD;  Location: [facility];  Service: Orthopedics;  Laterality: Left;  . TONSILLECTOMY    . TOTAL KNEE ARTHROPLASTY Right    right   Social History   Occupational History  . Occupation: Retired    Fish farm manager: RETIRED  Tobacco Use  . Smoking status: Never Smoker  . Smokeless tobacco: Never Used  Substance and Sexual Activity  . Alcohol use: No  . Drug use: No  . Sexual activity: Not Currently

## 2017-09-20 DIAGNOSIS — M069 Rheumatoid arthritis, unspecified: Secondary | ICD-10-CM | POA: Diagnosis not present

## 2017-09-20 DIAGNOSIS — F339 Major depressive disorder, recurrent, unspecified: Secondary | ICD-10-CM | POA: Diagnosis not present

## 2017-09-20 DIAGNOSIS — M48062 Spinal stenosis, lumbar region with neurogenic claudication: Secondary | ICD-10-CM | POA: Diagnosis not present

## 2017-09-20 DIAGNOSIS — M4135 Thoracogenic scoliosis, thoracolumbar region: Secondary | ICD-10-CM | POA: Diagnosis not present

## 2017-09-20 DIAGNOSIS — Z79891 Long term (current) use of opiate analgesic: Secondary | ICD-10-CM | POA: Diagnosis not present

## 2017-09-20 DIAGNOSIS — Z9181 History of falling: Secondary | ICD-10-CM | POA: Diagnosis not present

## 2017-09-20 DIAGNOSIS — Z981 Arthrodesis status: Secondary | ICD-10-CM | POA: Diagnosis not present

## 2017-09-20 DIAGNOSIS — I129 Hypertensive chronic kidney disease with stage 1 through stage 4 chronic kidney disease, or unspecified chronic kidney disease: Secondary | ICD-10-CM | POA: Diagnosis not present

## 2017-09-20 DIAGNOSIS — N189 Chronic kidney disease, unspecified: Secondary | ICD-10-CM | POA: Diagnosis not present

## 2017-09-20 DIAGNOSIS — D631 Anemia in chronic kidney disease: Secondary | ICD-10-CM | POA: Diagnosis not present

## 2017-09-24 DIAGNOSIS — M069 Rheumatoid arthritis, unspecified: Secondary | ICD-10-CM | POA: Diagnosis not present

## 2017-09-24 DIAGNOSIS — N189 Chronic kidney disease, unspecified: Secondary | ICD-10-CM | POA: Diagnosis not present

## 2017-09-24 DIAGNOSIS — F339 Major depressive disorder, recurrent, unspecified: Secondary | ICD-10-CM | POA: Diagnosis not present

## 2017-09-24 DIAGNOSIS — I129 Hypertensive chronic kidney disease with stage 1 through stage 4 chronic kidney disease, or unspecified chronic kidney disease: Secondary | ICD-10-CM | POA: Diagnosis not present

## 2017-09-24 DIAGNOSIS — B353 Tinea pedis: Secondary | ICD-10-CM | POA: Diagnosis not present

## 2017-09-24 DIAGNOSIS — M47812 Spondylosis without myelopathy or radiculopathy, cervical region: Secondary | ICD-10-CM | POA: Diagnosis not present

## 2017-09-24 DIAGNOSIS — M48062 Spinal stenosis, lumbar region with neurogenic claudication: Secondary | ICD-10-CM | POA: Diagnosis not present

## 2017-09-24 DIAGNOSIS — Z9181 History of falling: Secondary | ICD-10-CM | POA: Diagnosis not present

## 2017-09-24 DIAGNOSIS — Z981 Arthrodesis status: Secondary | ICD-10-CM | POA: Diagnosis not present

## 2017-09-24 DIAGNOSIS — I5031 Acute diastolic (congestive) heart failure: Secondary | ICD-10-CM | POA: Diagnosis not present

## 2017-09-24 DIAGNOSIS — Z79891 Long term (current) use of opiate analgesic: Secondary | ICD-10-CM | POA: Diagnosis not present

## 2017-09-24 DIAGNOSIS — I130 Hypertensive heart and chronic kidney disease with heart failure and stage 1 through stage 4 chronic kidney disease, or unspecified chronic kidney disease: Secondary | ICD-10-CM | POA: Diagnosis not present

## 2017-09-24 DIAGNOSIS — G43009 Migraine without aura, not intractable, without status migrainosus: Secondary | ICD-10-CM | POA: Diagnosis not present

## 2017-09-24 DIAGNOSIS — D631 Anemia in chronic kidney disease: Secondary | ICD-10-CM | POA: Diagnosis not present

## 2017-09-24 DIAGNOSIS — M4135 Thoracogenic scoliosis, thoracolumbar region: Secondary | ICD-10-CM | POA: Diagnosis not present

## 2017-09-24 DIAGNOSIS — S43005D Unspecified dislocation of left shoulder joint, subsequent encounter: Secondary | ICD-10-CM | POA: Diagnosis not present

## 2017-09-27 DIAGNOSIS — Z4789 Encounter for other orthopedic aftercare: Secondary | ICD-10-CM | POA: Diagnosis not present

## 2017-09-27 DIAGNOSIS — N3289 Other specified disorders of bladder: Secondary | ICD-10-CM | POA: Diagnosis not present

## 2017-09-27 DIAGNOSIS — M6281 Muscle weakness (generalized): Secondary | ICD-10-CM | POA: Diagnosis not present

## 2017-09-27 DIAGNOSIS — M48062 Spinal stenosis, lumbar region with neurogenic claudication: Secondary | ICD-10-CM | POA: Diagnosis not present

## 2017-09-27 DIAGNOSIS — G894 Chronic pain syndrome: Secondary | ICD-10-CM | POA: Diagnosis not present

## 2017-09-27 DIAGNOSIS — D649 Anemia, unspecified: Secondary | ICD-10-CM | POA: Diagnosis not present

## 2017-10-01 DIAGNOSIS — I129 Hypertensive chronic kidney disease with stage 1 through stage 4 chronic kidney disease, or unspecified chronic kidney disease: Secondary | ICD-10-CM | POA: Diagnosis not present

## 2017-10-01 DIAGNOSIS — M4135 Thoracogenic scoliosis, thoracolumbar region: Secondary | ICD-10-CM | POA: Diagnosis not present

## 2017-10-01 DIAGNOSIS — M069 Rheumatoid arthritis, unspecified: Secondary | ICD-10-CM | POA: Diagnosis not present

## 2017-10-01 DIAGNOSIS — D631 Anemia in chronic kidney disease: Secondary | ICD-10-CM | POA: Diagnosis not present

## 2017-10-01 DIAGNOSIS — Z9181 History of falling: Secondary | ICD-10-CM | POA: Diagnosis not present

## 2017-10-01 DIAGNOSIS — Z79891 Long term (current) use of opiate analgesic: Secondary | ICD-10-CM | POA: Diagnosis not present

## 2017-10-01 DIAGNOSIS — M48062 Spinal stenosis, lumbar region with neurogenic claudication: Secondary | ICD-10-CM | POA: Diagnosis not present

## 2017-10-01 DIAGNOSIS — S43005D Unspecified dislocation of left shoulder joint, subsequent encounter: Secondary | ICD-10-CM | POA: Diagnosis not present

## 2017-10-01 DIAGNOSIS — Z981 Arthrodesis status: Secondary | ICD-10-CM | POA: Diagnosis not present

## 2017-10-01 DIAGNOSIS — N189 Chronic kidney disease, unspecified: Secondary | ICD-10-CM | POA: Diagnosis not present

## 2017-10-01 DIAGNOSIS — I5031 Acute diastolic (congestive) heart failure: Secondary | ICD-10-CM | POA: Diagnosis not present

## 2017-10-01 DIAGNOSIS — B353 Tinea pedis: Secondary | ICD-10-CM | POA: Diagnosis not present

## 2017-10-01 DIAGNOSIS — G43009 Migraine without aura, not intractable, without status migrainosus: Secondary | ICD-10-CM | POA: Diagnosis not present

## 2017-10-01 DIAGNOSIS — F339 Major depressive disorder, recurrent, unspecified: Secondary | ICD-10-CM | POA: Diagnosis not present

## 2017-10-01 DIAGNOSIS — M47812 Spondylosis without myelopathy or radiculopathy, cervical region: Secondary | ICD-10-CM | POA: Diagnosis not present

## 2017-10-01 DIAGNOSIS — I130 Hypertensive heart and chronic kidney disease with heart failure and stage 1 through stage 4 chronic kidney disease, or unspecified chronic kidney disease: Secondary | ICD-10-CM | POA: Diagnosis not present

## 2017-10-08 DIAGNOSIS — M48062 Spinal stenosis, lumbar region with neurogenic claudication: Secondary | ICD-10-CM | POA: Diagnosis not present

## 2017-10-08 DIAGNOSIS — N189 Chronic kidney disease, unspecified: Secondary | ICD-10-CM | POA: Diagnosis not present

## 2017-10-08 DIAGNOSIS — Z9181 History of falling: Secondary | ICD-10-CM | POA: Diagnosis not present

## 2017-10-08 DIAGNOSIS — Z79891 Long term (current) use of opiate analgesic: Secondary | ICD-10-CM | POA: Diagnosis not present

## 2017-10-08 DIAGNOSIS — D631 Anemia in chronic kidney disease: Secondary | ICD-10-CM | POA: Diagnosis not present

## 2017-10-08 DIAGNOSIS — M069 Rheumatoid arthritis, unspecified: Secondary | ICD-10-CM | POA: Diagnosis not present

## 2017-10-08 DIAGNOSIS — Z981 Arthrodesis status: Secondary | ICD-10-CM | POA: Diagnosis not present

## 2017-10-08 DIAGNOSIS — F339 Major depressive disorder, recurrent, unspecified: Secondary | ICD-10-CM | POA: Diagnosis not present

## 2017-10-08 DIAGNOSIS — I129 Hypertensive chronic kidney disease with stage 1 through stage 4 chronic kidney disease, or unspecified chronic kidney disease: Secondary | ICD-10-CM | POA: Diagnosis not present

## 2017-10-08 DIAGNOSIS — M4135 Thoracogenic scoliosis, thoracolumbar region: Secondary | ICD-10-CM | POA: Diagnosis not present

## 2017-10-10 DIAGNOSIS — Z981 Arthrodesis status: Secondary | ICD-10-CM | POA: Diagnosis not present

## 2017-10-10 DIAGNOSIS — D631 Anemia in chronic kidney disease: Secondary | ICD-10-CM | POA: Diagnosis not present

## 2017-10-10 DIAGNOSIS — M48062 Spinal stenosis, lumbar region with neurogenic claudication: Secondary | ICD-10-CM | POA: Diagnosis not present

## 2017-10-10 DIAGNOSIS — F339 Major depressive disorder, recurrent, unspecified: Secondary | ICD-10-CM | POA: Diagnosis not present

## 2017-10-10 DIAGNOSIS — M4135 Thoracogenic scoliosis, thoracolumbar region: Secondary | ICD-10-CM | POA: Diagnosis not present

## 2017-10-10 DIAGNOSIS — Z9181 History of falling: Secondary | ICD-10-CM | POA: Diagnosis not present

## 2017-10-10 DIAGNOSIS — M069 Rheumatoid arthritis, unspecified: Secondary | ICD-10-CM | POA: Diagnosis not present

## 2017-10-10 DIAGNOSIS — I129 Hypertensive chronic kidney disease with stage 1 through stage 4 chronic kidney disease, or unspecified chronic kidney disease: Secondary | ICD-10-CM | POA: Diagnosis not present

## 2017-10-10 DIAGNOSIS — Z79891 Long term (current) use of opiate analgesic: Secondary | ICD-10-CM | POA: Diagnosis not present

## 2017-10-10 DIAGNOSIS — N189 Chronic kidney disease, unspecified: Secondary | ICD-10-CM | POA: Diagnosis not present

## 2017-10-15 DIAGNOSIS — F339 Major depressive disorder, recurrent, unspecified: Secondary | ICD-10-CM | POA: Diagnosis not present

## 2017-10-15 DIAGNOSIS — M4135 Thoracogenic scoliosis, thoracolumbar region: Secondary | ICD-10-CM | POA: Diagnosis not present

## 2017-10-15 DIAGNOSIS — M48062 Spinal stenosis, lumbar region with neurogenic claudication: Secondary | ICD-10-CM | POA: Diagnosis not present

## 2017-10-15 DIAGNOSIS — M069 Rheumatoid arthritis, unspecified: Secondary | ICD-10-CM | POA: Diagnosis not present

## 2017-10-15 DIAGNOSIS — Z9181 History of falling: Secondary | ICD-10-CM | POA: Diagnosis not present

## 2017-10-15 DIAGNOSIS — Z981 Arthrodesis status: Secondary | ICD-10-CM | POA: Diagnosis not present

## 2017-10-15 DIAGNOSIS — I129 Hypertensive chronic kidney disease with stage 1 through stage 4 chronic kidney disease, or unspecified chronic kidney disease: Secondary | ICD-10-CM | POA: Diagnosis not present

## 2017-10-15 DIAGNOSIS — Z79891 Long term (current) use of opiate analgesic: Secondary | ICD-10-CM | POA: Diagnosis not present

## 2017-10-15 DIAGNOSIS — D631 Anemia in chronic kidney disease: Secondary | ICD-10-CM | POA: Diagnosis not present

## 2017-10-15 DIAGNOSIS — N189 Chronic kidney disease, unspecified: Secondary | ICD-10-CM | POA: Diagnosis not present

## 2017-10-16 DIAGNOSIS — R58 Hemorrhage, not elsewhere classified: Secondary | ICD-10-CM | POA: Diagnosis not present

## 2017-10-16 DIAGNOSIS — B353 Tinea pedis: Secondary | ICD-10-CM | POA: Diagnosis not present

## 2017-10-16 DIAGNOSIS — L089 Local infection of the skin and subcutaneous tissue, unspecified: Secondary | ICD-10-CM | POA: Diagnosis not present

## 2017-10-24 ENCOUNTER — Emergency Department (HOSPITAL_COMMUNITY): Payer: PPO

## 2017-10-24 ENCOUNTER — Emergency Department (HOSPITAL_COMMUNITY)
Admission: EM | Admit: 2017-10-24 | Discharge: 2017-10-24 | Disposition: A | Discharge status: Home / Self Care | Payer: PPO | Attending: Emergency Medicine | Admitting: Emergency Medicine

## 2017-10-24 DIAGNOSIS — I110 Hypertensive heart disease with heart failure: Secondary | ICD-10-CM | POA: Insufficient documentation

## 2017-10-24 DIAGNOSIS — J13 Pneumonia due to Streptococcus pneumoniae: Secondary | ICD-10-CM | POA: Diagnosis not present

## 2017-10-24 DIAGNOSIS — I509 Heart failure, unspecified: Secondary | ICD-10-CM | POA: Insufficient documentation

## 2017-10-24 DIAGNOSIS — Z79899 Other long term (current) drug therapy: Secondary | ICD-10-CM | POA: Diagnosis not present

## 2017-10-24 DIAGNOSIS — Z7982 Long term (current) use of aspirin: Secondary | ICD-10-CM | POA: Insufficient documentation

## 2017-10-24 DIAGNOSIS — J40 Bronchitis, not specified as acute or chronic: Secondary | ICD-10-CM | POA: Insufficient documentation

## 2017-10-24 DIAGNOSIS — J80 Acute respiratory distress syndrome: Secondary | ICD-10-CM | POA: Diagnosis not present

## 2017-10-24 DIAGNOSIS — R404 Transient alteration of awareness: Secondary | ICD-10-CM | POA: Diagnosis not present

## 2017-10-24 DIAGNOSIS — R0602 Shortness of breath: Secondary | ICD-10-CM | POA: Diagnosis not present

## 2017-10-24 DIAGNOSIS — R05 Cough: Secondary | ICD-10-CM | POA: Diagnosis not present

## 2017-10-24 DIAGNOSIS — I5040 Unspecified combined systolic (congestive) and diastolic (congestive) heart failure: Secondary | ICD-10-CM | POA: Diagnosis not present

## 2017-10-24 DIAGNOSIS — R531 Weakness: Secondary | ICD-10-CM | POA: Diagnosis not present

## 2017-10-24 LAB — COMPREHENSIVE METABOLIC PANEL
ALBUMIN: 3 g/dL — AB (ref 3.5–5.0)
ALT: 16 U/L (ref 14–54)
AST: 20 U/L (ref 15–41)
Alkaline Phosphatase: 91 U/L (ref 38–126)
Anion gap: 8 (ref 5–15)
BUN: 20 mg/dL (ref 6–20)
CHLORIDE: 105 mmol/L (ref 101–111)
CO2: 28 mmol/L (ref 22–32)
Calcium: 8.8 mg/dL — ABNORMAL LOW (ref 8.9–10.3)
Creatinine, Ser: 0.67 mg/dL (ref 0.44–1.00)
GFR calc Af Amer: >60 mL/min (ref >60)
Glucose, Bld: 123 mg/dL — ABNORMAL HIGH (ref 65–99)
POTASSIUM: 3.2 mmol/L — AB (ref 3.5–5.1)
Sodium: 141 mmol/L (ref 135–145)
Total Bilirubin: 0.8 mg/dL (ref 0.3–1.2)
Total Protein: 7 g/dL (ref 6.5–8.1)

## 2017-10-24 LAB — CBC WITH DIFFERENTIAL/PLATELET
BASOS ABS: 0 10*3/uL (ref 0.0–0.1)
BASOS PCT: 0 %
EOS ABS: 0 10*3/uL (ref 0.0–0.7)
EOS PCT: 0 %
HCT: 34.9 % — ABNORMAL LOW (ref 36.0–46.0)
Hemoglobin: 11.1 g/dL — ABNORMAL LOW (ref 12.0–15.0)
Lymphocytes Relative: 16 %
Lymphs Abs: 2 10*3/uL (ref 0.7–4.0)
MCH: 31.4 pg (ref 26.0–34.0)
MCHC: 31.8 g/dL (ref 30.0–36.0)
MCV: 98.6 fL (ref 78.0–100.0)
MONO ABS: 1.4 10*3/uL — AB (ref 0.1–1.0)
Monocytes Relative: 11 %
Neutro Abs: 9.5 10*3/uL — ABNORMAL HIGH (ref 1.7–7.7)
Neutrophils Relative %: 73 %
PLATELETS: 270 10*3/uL (ref 150–400)
RBC: 3.54 MIL/uL — ABNORMAL LOW (ref 3.87–5.11)
RDW: 13.7 % (ref 11.5–15.5)
WBC: 13 10*3/uL — ABNORMAL HIGH (ref 4.0–10.5)

## 2017-10-24 LAB — INFLUENZA PANEL BY PCR (TYPE A & B)
INFLAPCR: NEGATIVE
INFLBPCR: NEGATIVE

## 2017-10-24 LAB — I-STAT CG4 LACTIC ACID, ED: Lactic Acid, Venous: 1.18 mmol/L (ref 0.5–1.9)

## 2017-10-24 LAB — I-STAT TROPONIN, ED: TROPONIN I, POC: 0 ng/mL (ref 0.00–0.08)

## 2017-10-24 MED ORDER — AZITHROMYCIN 250 MG PO TABS
500.0000 mg | ORAL_TABLET | Freq: Once | ORAL | Status: AC
  Administered 2017-10-24: 500 mg via ORAL
  Filled 2017-10-24: qty 2

## 2017-10-24 MED ORDER — AZITHROMYCIN 250 MG PO TABS: 250.0000 mg | ORAL_TABLET | Freq: Every day | ORAL | 0 refills | Status: DC

## 2017-10-24 MED ORDER — POTASSIUM CHLORIDE 20 MEQ/15ML (10%) PO SOLN
40.0000 meq | Freq: Once | ORAL | Status: AC
Start: 2017-10-24 — End: 2017-10-24
  Administered 2017-10-24: 40 meq via ORAL
  Filled 2017-10-24: qty 30

## 2017-10-24 MED ORDER — GUAIFENESIN 100 MG/5ML PO SYRP: 100.0000 mg | ORAL_SOLUTION | ORAL | 0 refills | Status: DC | PRN

## 2017-10-24 MED ORDER — ALBUTEROL SULFATE HFA 108 (90 BASE) MCG/ACT IN AERS: 2.0000 | INHALATION_SPRAY | RESPIRATORY_TRACT | 0 refills | Status: DC | PRN

## 2017-10-24 NOTE — ED Notes (Signed)
Patient transported to X-ray 

## 2017-10-24 NOTE — Discharge Instructions (Signed)
Chest x-ray today did not show any evidence of pneumonia.  Take Zithromax as prescribed until all gone.  Use inhaler 2 puffs every 4 hours.  Robitussin for cough as needed.  Follow-up with family doctor in 2-3 days for recheck.  Return if worsening.

## 2017-10-24 NOTE — ED Notes (Signed)
Patient given a snack and oral fluids while waiting for [facility].

## 2017-10-24 NOTE — ED Provider Notes (Signed)
4:36 PM D/w CM, patient has had updated face-to-face and will have increased work as an outpatient to get placed into placement.  Otherwise appears stable for discharge home.   Sherwood Gambler, MD 10/24/17 [phone]

## 2017-10-24 NOTE — ED Notes (Signed)
Patient transported to radiology

## 2017-10-24 NOTE — Progress Notes (Signed)
CSW spoke with granddaughter, [name], and informed her that pt will be being discharged. Granddaughter agreeable with pt being discharged home with home health.   Wendelyn Breslow, [facility]  [phone]

## 2017-10-24 NOTE — ED Notes (Signed)
Dominique Mckee [phone]

## 2017-10-24 NOTE — Progress Notes (Addendum)
CSW received phone call from [facility] ED about pt. CSW will follow up with pt and pt's family member, Jonathon Resides.   Wendelyn Breslow, [facility]  [phone]

## 2017-10-24 NOTE — ED Notes (Signed)
[facility] contacted regarding patient transport.  

## 2017-10-24 NOTE — ED Triage Notes (Signed)
Transported by [employer] from [employer] office. Patient was seen by Dr. Lysle Rubens today, chief compliant--productive cough, body aches, weakness, chills and shortness of breath x 4 days. Sent here for further evaluation.

## 2017-10-24 NOTE — ED Provider Notes (Signed)
[facility] DEPT Provider Note   CSN: [id number] Arrival date & time: 10/24/17  1150     History   Chief Complaint Chief Complaint  Patient presents with  . Shortness of Breath    HPI Dominique Mckee is a 83 y.o. female.  HPI Dominique Mckee is a 83 y.o. female with history of arthritis, CHF, migraine headaches, rheumatoid arthritis, presents to emergency department complaining of cough and upper respiratory symptoms.  Patient states she has been sick for about 4 days.  She reports productive cough with thick colored sputum.  She is unsure if she has had any fever, reports chills.  She reports nasal congestion.  She reports chest tightness and shortness of breath.  She denies any sore throat.  No nausea or vomiting.  Diarrhea.  No abdominal pain.  Patient has not been given any medications for her cold symptoms.  She went to primary care doctor today, sent here for further evaluation.  Past Medical History:  Diagnosis Date  . Arthritis   . Benign essential tremor   . Cervical spondylosis   . Cervicogenic headache   . CHF (congestive heart failure) ([place])   . Chronic renal insufficiency   . Degenerative disc disease   . Depression   . Fibromyalgia   . Gait disorder   . GERD (gastroesophageal reflux disease)   . Heart murmur   . HOH (hard of hearing)   . Hypertension   . Lumbar spondylosis   . Migraine headache   . Mild obesity   . Osteopenia   . Rheumatoid arthritis [facility])     Patient Active Problem List   Diagnosis Date Noted  . Acute diastolic congestive heart failure ([place]) 07/08/2015  . Acute respiratory failure with hypoxia ([place]) 07/08/2015  . Shoulder dislocation, recurrent 07/06/2015  . HTN (hypertension) 07/06/2015  . Rheumatoid arthritis ([place]) 07/06/2015  . GERD (gastroesophageal reflux disease) 07/06/2015  . Shoulder dislocation   . Concussion with no loss of consciousness 12/30/2013  . Cervical spondylosis without myelopathy  01/01/2013  . Migraine without aura 01/01/2013  . Abnormality of gait 01/01/2013  . Essential and other specified forms of tremor 01/01/2013    Past Surgical History:  Procedure Laterality Date  . ABDOMINAL HYSTERECTOMY    . APPENDECTOMY    . CHOLECYSTECTOMY    . COLONOSCOPY    . ELBOW SURGERY Left   . HAMMER TOE SURGERY Left   . LUMBAR FUSION  02/2017  . SHOULDER CLOSED REDUCTION Right 07/08/2015   Procedure: RIGHT SHOULDER CLOSED REDUCTION;  Surgeon: Meredith Pel, MD;  Location: [place];  Service: Orthopedics;  Laterality: Right;  . SHOULDER CLOSED REDUCTION Left 08/23/2017   Procedure: CLOSED REDUCTION LEFT SHOULDER;  Surgeon: Meredith Pel, MD;  Location: [place];  Service: Orthopedics;  Laterality: Left;  . TONSILLECTOMY    . TOTAL KNEE ARTHROPLASTY Right    right    OB History    Gravida Para Term Preterm AB Living             1   SAB TAB Ectopic Multiple Live Births                   Home Medications    Prior to Admission medications   Medication Sig Start Date End Date Taking? Authorizing Provider  acetaminophen (TYLENOL) 500 MG tablet Take 500 mg by mouth every 6 (six) hours as needed for mild pain.    [provider]  aspirin  EC 81 MG EC tablet Take 1 tablet (81 mg total) by mouth 2 (two) times daily. 08/26/17   Meredith Pel, MD  Calcium Carb-Cholecalciferol (CALCIUM 600+D3) 600-200 MG-UNIT TABS Take 1 tablet by mouth daily.    [provider]  diclofenac sodium (VOLTAREN) 1 % GEL Apply 1 application topically 2 (two) times daily as needed (pain).     [provider]  DULoxetine (CYMBALTA) 30 MG capsule Take 60 mg by mouth 2 (two) times daily.  05/14/13   [provider]  feeding supplement, ENSURE ENLIVE, (ENSURE ENLIVE) LIQD Take 237 mLs by mouth 3 (three) times daily between meals. 08/26/17   Meredith Pel, MD  ferrous sulfate 325 (65 FE) MG tablet Take 325 mg by mouth every evening.    [provider]  folic acid (FOLVITE) 1 MG tablet Take 3 mg by mouth daily.  12/29/13   [provider]  methotrexate 50 MG/2ML injection Inject 0.7 mLs into the muscle every 7 (seven) days. 0.59ml = 17.5mg  06/09/15   [provider]  Multiple Vitamin (MULTIVITAMIN WITH MINERALS) TABS tablet Take 1 tablet by mouth daily. 08/27/17   Meredith Pel, MD  oxyCODONE (ROXICODONE) 5 MG immediate release tablet Take 5 mg by mouth every 6 (six) hours as needed for severe pain.    [provider]  pantoprazole (PROTONIX) 40 MG tablet Take 40 mg by mouth daily. 12/23/13   [provider]  polyethylene glycol powder (MIRALAX) powder Take 17 g by mouth daily. 07/10/15   Janece Canterbury, MD  propranolol (INDERAL) 20 MG tablet Take 1 tablet (20 mg total) by mouth 2 (two) times daily. 07/10/15   Janece Canterbury, MD  senna (SENOKOT) 8.6 MG TABS tablet Take 2 tablets (17.2 mg total) by mouth at bedtime. 07/10/15   Janece Canterbury, MD    Family History Family History  Problem Relation Age of Onset  . Osteoporosis Mother   . Intracerebral hemorrhage Mother 62  . AAA (abdominal aortic aneurysm) Father 69  . Lung cancer Brother   . Diabetes Brother   . Lung cancer Brother   . Diabetes Sister   . Lung cancer Sister   . Bone cancer Sister   . Kidney failure Son        left kidney transplant  . Diabetes type I Daughter   . Anesthesia problems Neg Hx   . Hypertension Neg Hx     Social History Social History   Tobacco Use  . Smoking status: Never Smoker  . Smokeless tobacco: Never Used  Substance Use Topics  . Alcohol use: No  . Drug use: No     Allergies   Feldene [piroxicam]; Clinoril [sulindac]; and Morphine and related   Review of Systems Review of Systems  Constitutional: Negative for chills and fever.  HENT: Positive for congestion. Negative for sore throat.   Respiratory: Positive for cough, chest tightness and shortness of breath.   Cardiovascular: Negative for  chest pain, palpitations and leg swelling.  Gastrointestinal: Negative for abdominal pain, diarrhea, nausea and vomiting.  Genitourinary: Negative for dysuria and flank pain.  Musculoskeletal: Negative for arthralgias, myalgias, neck pain and neck stiffness.  Skin: Negative for rash.  Neurological: Positive for headaches. Negative for dizziness and weakness.  All other systems reviewed and are negative.    Physical Exam Updated Vital Signs BP 125/88 (BP Location: Right Arm)   Pulse 60   Temp (!) 97.4 F (36.3 C) (Oral)   Resp 19  SpO2 93%   Physical Exam  Constitutional: She appears well-developed and well-nourished. No distress.  HENT:  Head: Normocephalic.  Eyes: Conjunctivae are normal.  Neck: Neck supple.  Cardiovascular: Normal rate, regular rhythm and normal heart sounds.  Pulmonary/Chest: Effort normal. No respiratory distress. She has no wheezes. She has rales in the right lower field and the left lower field.  Abdominal: Soft. Bowel sounds are normal. She exhibits no distension. There is no tenderness. There is no rebound.  Musculoskeletal: She exhibits no edema.  Neurological: She is alert.  Skin: Skin is warm and dry.  Psychiatric: She has a normal mood and affect. Her behavior is normal.  Nursing note and vitals reviewed.    ED Treatments / Results  Labs (all labs ordered are listed, but only abnormal results are displayed) Labs Reviewed  CBC WITH DIFFERENTIAL/PLATELET - Abnormal; Notable for the following components:      Result Value   WBC 13.0 (*)    RBC 3.54 (*)    Hemoglobin 11.1 (*)    HCT 34.9 (*)    Neutro Abs 9.5 (*)    Monocytes Absolute 1.4 (*)    All other components within normal limits  COMPREHENSIVE METABOLIC PANEL - Abnormal; Notable for the following components:   Potassium 3.2 (*)    Glucose, Bld 123 (*)    Calcium 8.8 (*)    Albumin 3.0 (*)    All other components within normal limits  INFLUENZA PANEL BY PCR (TYPE A & B)  I-STAT  TROPONIN, ED  I-STAT CG4 LACTIC ACID, ED    EKG  EKG Interpretation None       Radiology Dg Chest 2 View  Result Date: 10/24/2017 CLINICAL DATA:  Shortness of breath.  Cough. EXAM: CHEST  2 VIEW COMPARISON:  09/13/2017 07/05/2015 and 11/19/2010 FINDINGS: The heart size and pulmonary vascularity are normal. Calcification in the thoracic aorta. Slight accentuation of the interstitial markings in the right upper lobe and in the lingula. No consolidative infiltrates or effusions. Large hiatal hernia, chronic. No acute bone abnormality. IMPRESSION: Slight accentuation of the markings in the right upper lobe and lingula with slight peribronchial thickening consistent with bronchitis. Electronically Signed   By: Lorriane Shire M.D.   On: 10/24/2017 13:03    Procedures Procedures (including critical care time)  Medications Ordered in ED Medications  potassium chloride 20 MEQ/15ML (10%) solution 40 mEq (40 mEq Oral Given 10/24/17 1436)  azithromycin (ZITHROMAX) tablet 500 mg (500 mg Oral Given 10/24/17 1436)     Initial Impression / Assessment and Plan / ED Course  I have reviewed the triage vital signs and the nursing notes.  Pertinent labs & imaging results that were available during my care of the patient were reviewed by me and considered in my medical decision making (see chart for details).     Patient from home, here with a caretaker, reports flulike symptoms for 4 days, now with worsening cough.  Sent from PCP.  Patient is oxygen is 90% on room air.  Placed on 2 L.  Lungs with Rales at bases bilaterally.  Will check chest x-ray, will get labs, will monitor.  2:29 PM Labs show a white blood cell count of 13, hemoglobin 11.1, potassium 3.2, otherwise unremarkable.  Chest x-ray does not show an acute pneumonia, consistent with bronchitis.  Patient's oxygen saturation is normal, vital signs are normal. Discussed results with care taker and the patient. Discussed with Dr. Venora Maples. Pt  stable for dc home. Will start  on antibiotic for possible bacterial infection. Robitussin for cough. Albuterol Follow up with primary care doctor.  Vitals:   10/24/17 1202 10/24/17 1206  BP:  125/88  Pulse:  60  Resp:  19  Temp:  (!) 97.4 F (36.3 C)  TempSrc:  Oral  SpO2: 93% 93%    3:25 PM Just spoke with patient's family member [name], who explained to me that she is worried about patient going home.  Patient lives apparently lives with her son.  She was just discharged from a skilled nursing facility a week ago.  Apparently she does have a caretaker that comes but only stays for a few hours at a time.  Patient's son does not help out much with her care, and patient has not been out of her bed for a week.  Patient is left alone at nighttime, which she states is dangerous in case of a fire or if she needed to use the bathroom.  I will consult social worker to see if patient could be placed into a facility at this time.  Spoke with a Education officer, museum who will look into the case.   Final Clinical Impressions(s) / ED Diagnoses   Final diagnoses:  Bronchitis    ED Discharge Orders        Ordered    albuterol (PROVENTIL HFA;VENTOLIN HFA) 108 (90 Base) MCG/ACT inhaler  Every 4 hours PRN     10/24/17 1437    azithromycin (ZITHROMAX) 250 MG tablet  Daily     10/24/17 1437    guaifenesin (ROBITUSSIN) 100 MG/5ML syrup  Every 4 hours PRN     10/24/17 1437      Jeannett Senior, PA-C 10/24/17 1439  Jeannett Senior, PA-C 10/25/17 [place], MD 10/27/17 [phone]

## 2017-10-24 NOTE — ED Notes (Signed)
Dominique Mckee [phone].

## 2017-10-24 NOTE — Care Management Note (Signed)
Case Management Note  Patient Details  Name: Dominique Mckee MRN: [id number] Date of Birth: [birth year]  Subjective/Objective:                  83 y.o. female with history of arthritis, CHF, migraine headaches, rheumatoid arthritis, presents to emergency department complaining of cough and upper respiratory symptoms.  Patient states she has been sick for about 4 days  Action/Plan: CM consulted for additional HHS.  Pt is from home, son and granddaughter are very involved in care.  Pt is active with [facility] for RN and PT.  Increased services to RN PT OT NA SW and updated Tim with [facility].  Advised him that goal is ALF placement and family is already working on finances to achieve goal.  Pt additionally has PCS through [employer].  No further CM needs noted at this time.  Expected Discharge Date:     10/24/2017             Expected Discharge Plan:  [place]  In-House Referral:  Clinical Social Work  Discharge planning Services  CM Consult  Post Acute Care Choice:  Home Health Choice offered to:  Patient, Adult Children  HH Arranged:  RN, PT, OT, Nurse's Aide, Social Work [employer] Agency:  [facility] [employer]  Status of Service:  Completed, signed off  Airelle Everding, Benjaman Lobe, RN 10/24/2017, 4:42 PM

## 2017-10-24 NOTE — Progress Notes (Signed)
CSW followed up with Tatyana, pt's EDP. Pt does not meet criteria for SNF placement at this time.   CSW discussed plan of discharge with pt's granddaughter, [place] at [phone]. Granddadughter explained that pt has home health with pt and ot established. Pt has private sitters, but they were not coming when the pt's son was there. Family members are looking at [employer] for [employer]. Family is working to get finances in line. Granddaughter informed CSW that pt will have to sell her house to fund the ALF. Granddaughter stated comfortable with pt discharging home while arrangements are made. CSW explained that if family begins to feel pt meets SNF criteria, pt can see PCP for placement.   Plan: CSW will call CM to follow up with pt's son about home health.   Wendelyn Breslow, [facility]  [phone]

## 2017-10-28 DIAGNOSIS — M6281 Muscle weakness (generalized): Secondary | ICD-10-CM | POA: Diagnosis not present

## 2017-10-28 DIAGNOSIS — G894 Chronic pain syndrome: Secondary | ICD-10-CM | POA: Diagnosis not present

## 2017-10-28 DIAGNOSIS — Z4789 Encounter for other orthopedic aftercare: Secondary | ICD-10-CM | POA: Diagnosis not present

## 2017-10-28 DIAGNOSIS — D649 Anemia, unspecified: Secondary | ICD-10-CM | POA: Diagnosis not present

## 2017-10-28 DIAGNOSIS — N3289 Other specified disorders of bladder: Secondary | ICD-10-CM | POA: Diagnosis not present

## 2017-10-28 DIAGNOSIS — M48062 Spinal stenosis, lumbar region with neurogenic claudication: Secondary | ICD-10-CM | POA: Diagnosis not present

## 2017-11-04 DIAGNOSIS — F329 Major depressive disorder, single episode, unspecified: Secondary | ICD-10-CM | POA: Diagnosis not present

## 2017-11-04 DIAGNOSIS — G8929 Other chronic pain: Secondary | ICD-10-CM | POA: Diagnosis not present

## 2017-11-04 DIAGNOSIS — I10 Essential (primary) hypertension: Secondary | ICD-10-CM | POA: Diagnosis not present

## 2017-11-04 DIAGNOSIS — M069 Rheumatoid arthritis, unspecified: Secondary | ICD-10-CM | POA: Diagnosis not present

## 2017-11-04 DIAGNOSIS — N183 Chronic kidney disease, stage 3 (moderate): Secondary | ICD-10-CM | POA: Diagnosis not present

## 2017-11-04 DIAGNOSIS — R269 Unspecified abnormalities of gait and mobility: Secondary | ICD-10-CM | POA: Diagnosis not present

## 2017-11-04 DIAGNOSIS — I5030 Unspecified diastolic (congestive) heart failure: Secondary | ICD-10-CM | POA: Diagnosis not present

## 2017-11-14 DIAGNOSIS — H903 Sensorineural hearing loss, bilateral: Secondary | ICD-10-CM | POA: Diagnosis not present

## 2017-11-27 DIAGNOSIS — H903 Sensorineural hearing loss, bilateral: Secondary | ICD-10-CM | POA: Diagnosis not present

## 2017-12-03 DIAGNOSIS — M545 Low back pain: Secondary | ICD-10-CM | POA: Diagnosis not present

## 2017-12-03 DIAGNOSIS — G894 Chronic pain syndrome: Secondary | ICD-10-CM | POA: Diagnosis not present

## 2017-12-03 DIAGNOSIS — M5136 Other intervertebral disc degeneration, lumbar region: Secondary | ICD-10-CM | POA: Diagnosis not present

## 2017-12-17 DIAGNOSIS — K219 Gastro-esophageal reflux disease without esophagitis: Secondary | ICD-10-CM | POA: Diagnosis not present

## 2017-12-17 DIAGNOSIS — G8929 Other chronic pain: Secondary | ICD-10-CM | POA: Diagnosis not present

## 2017-12-17 DIAGNOSIS — R739 Hyperglycemia, unspecified: Secondary | ICD-10-CM | POA: Diagnosis not present

## 2017-12-17 DIAGNOSIS — I5030 Unspecified diastolic (congestive) heart failure: Secondary | ICD-10-CM | POA: Diagnosis not present

## 2017-12-17 DIAGNOSIS — M8588 Other specified disorders of bone density and structure, other site: Secondary | ICD-10-CM | POA: Diagnosis not present

## 2017-12-17 DIAGNOSIS — F329 Major depressive disorder, single episode, unspecified: Secondary | ICD-10-CM | POA: Diagnosis not present

## 2017-12-17 DIAGNOSIS — R7303 Prediabetes: Secondary | ICD-10-CM | POA: Diagnosis not present

## 2017-12-17 DIAGNOSIS — R269 Unspecified abnormalities of gait and mobility: Secondary | ICD-10-CM | POA: Diagnosis not present

## 2017-12-17 DIAGNOSIS — I10 Essential (primary) hypertension: Secondary | ICD-10-CM | POA: Diagnosis not present

## 2017-12-17 DIAGNOSIS — M519 Unspecified thoracic, thoracolumbar and lumbosacral intervertebral disc disorder: Secondary | ICD-10-CM | POA: Diagnosis not present

## 2017-12-17 DIAGNOSIS — M069 Rheumatoid arthritis, unspecified: Secondary | ICD-10-CM | POA: Diagnosis not present

## 2017-12-17 DIAGNOSIS — Z136 Encounter for screening for cardiovascular disorders: Secondary | ICD-10-CM | POA: Diagnosis not present

## 2017-12-17 DIAGNOSIS — Z23 Encounter for immunization: Secondary | ICD-10-CM | POA: Diagnosis not present

## 2017-12-17 DIAGNOSIS — N183 Chronic kidney disease, stage 3 (moderate): Secondary | ICD-10-CM | POA: Diagnosis not present

## 2017-12-17 DIAGNOSIS — Z1389 Encounter for screening for other disorder: Secondary | ICD-10-CM | POA: Diagnosis not present

## 2017-12-18 ENCOUNTER — Ambulatory Visit (INDEPENDENT_AMBULATORY_CARE_PROVIDER_SITE_OTHER): Payer: PPO | Admitting: Orthopedic Surgery

## 2017-12-31 DIAGNOSIS — M545 Low back pain: Secondary | ICD-10-CM | POA: Diagnosis not present

## 2017-12-31 DIAGNOSIS — G894 Chronic pain syndrome: Secondary | ICD-10-CM | POA: Diagnosis not present

## 2018-01-21 DIAGNOSIS — M545 Low back pain: Secondary | ICD-10-CM | POA: Diagnosis not present

## 2018-01-21 DIAGNOSIS — M14671 Charcot's joint, right ankle and foot: Secondary | ICD-10-CM | POA: Diagnosis not present

## 2018-01-21 DIAGNOSIS — M8088XD Other osteoporosis with current pathological fracture, vertebra(e), subsequent encounter for fracture with routine healing: Secondary | ICD-10-CM | POA: Diagnosis not present

## 2018-01-21 DIAGNOSIS — M150 Primary generalized (osteo)arthritis: Secondary | ICD-10-CM | POA: Diagnosis not present

## 2018-01-21 DIAGNOSIS — M0609 Rheumatoid arthritis without rheumatoid factor, multiple sites: Secondary | ICD-10-CM | POA: Diagnosis not present

## 2018-01-21 DIAGNOSIS — Z79899 Other long term (current) drug therapy: Secondary | ICD-10-CM | POA: Diagnosis not present

## 2018-01-27 DIAGNOSIS — I5030 Unspecified diastolic (congestive) heart failure: Secondary | ICD-10-CM | POA: Diagnosis not present

## 2018-01-27 DIAGNOSIS — F329 Major depressive disorder, single episode, unspecified: Secondary | ICD-10-CM | POA: Diagnosis not present

## 2018-01-27 DIAGNOSIS — M5136 Other intervertebral disc degeneration, lumbar region: Secondary | ICD-10-CM | POA: Diagnosis not present

## 2018-01-27 DIAGNOSIS — G894 Chronic pain syndrome: Secondary | ICD-10-CM | POA: Diagnosis not present

## 2018-01-27 DIAGNOSIS — K219 Gastro-esophageal reflux disease without esophagitis: Secondary | ICD-10-CM | POA: Diagnosis not present

## 2018-01-27 DIAGNOSIS — I130 Hypertensive heart and chronic kidney disease with heart failure and stage 1 through stage 4 chronic kidney disease, or unspecified chronic kidney disease: Secondary | ICD-10-CM | POA: Diagnosis not present

## 2018-01-27 DIAGNOSIS — Z9181 History of falling: Secondary | ICD-10-CM | POA: Diagnosis not present

## 2018-01-27 DIAGNOSIS — M797 Fibromyalgia: Secondary | ICD-10-CM | POA: Diagnosis not present

## 2018-01-27 DIAGNOSIS — N183 Chronic kidney disease, stage 3 (moderate): Secondary | ICD-10-CM | POA: Diagnosis not present

## 2018-01-27 DIAGNOSIS — M069 Rheumatoid arthritis, unspecified: Secondary | ICD-10-CM | POA: Diagnosis not present

## 2018-01-27 DIAGNOSIS — Z7982 Long term (current) use of aspirin: Secondary | ICD-10-CM | POA: Diagnosis not present

## 2018-02-04 DIAGNOSIS — H04123 Dry eye syndrome of bilateral lacrimal glands: Secondary | ICD-10-CM | POA: Diagnosis not present

## 2018-02-04 DIAGNOSIS — H2513 Age-related nuclear cataract, bilateral: Secondary | ICD-10-CM | POA: Diagnosis not present

## 2018-02-06 DIAGNOSIS — M47896 Other spondylosis, lumbar region: Secondary | ICD-10-CM | POA: Diagnosis not present

## 2018-02-06 DIAGNOSIS — G894 Chronic pain syndrome: Secondary | ICD-10-CM | POA: Diagnosis not present

## 2018-02-06 DIAGNOSIS — M545 Low back pain: Secondary | ICD-10-CM | POA: Diagnosis not present

## 2018-02-06 DIAGNOSIS — M542 Cervicalgia: Secondary | ICD-10-CM | POA: Diagnosis not present

## 2018-02-21 DIAGNOSIS — I5030 Unspecified diastolic (congestive) heart failure: Secondary | ICD-10-CM | POA: Diagnosis not present

## 2018-02-21 DIAGNOSIS — K219 Gastro-esophageal reflux disease without esophagitis: Secondary | ICD-10-CM | POA: Diagnosis not present

## 2018-02-21 DIAGNOSIS — M069 Rheumatoid arthritis, unspecified: Secondary | ICD-10-CM | POA: Diagnosis not present

## 2018-02-21 DIAGNOSIS — M5136 Other intervertebral disc degeneration, lumbar region: Secondary | ICD-10-CM | POA: Diagnosis not present

## 2018-02-21 DIAGNOSIS — Z9181 History of falling: Secondary | ICD-10-CM | POA: Diagnosis not present

## 2018-02-21 DIAGNOSIS — G894 Chronic pain syndrome: Secondary | ICD-10-CM | POA: Diagnosis not present

## 2018-02-21 DIAGNOSIS — N183 Chronic kidney disease, stage 3 (moderate): Secondary | ICD-10-CM | POA: Diagnosis not present

## 2018-02-21 DIAGNOSIS — Z7982 Long term (current) use of aspirin: Secondary | ICD-10-CM | POA: Diagnosis not present

## 2018-02-21 DIAGNOSIS — I130 Hypertensive heart and chronic kidney disease with heart failure and stage 1 through stage 4 chronic kidney disease, or unspecified chronic kidney disease: Secondary | ICD-10-CM | POA: Diagnosis not present

## 2018-02-21 DIAGNOSIS — F329 Major depressive disorder, single episode, unspecified: Secondary | ICD-10-CM | POA: Diagnosis not present

## 2018-02-21 DIAGNOSIS — M797 Fibromyalgia: Secondary | ICD-10-CM | POA: Diagnosis not present

## 2018-02-26 DIAGNOSIS — G894 Chronic pain syndrome: Secondary | ICD-10-CM | POA: Diagnosis not present

## 2018-02-26 DIAGNOSIS — I5030 Unspecified diastolic (congestive) heart failure: Secondary | ICD-10-CM | POA: Diagnosis not present

## 2018-02-26 DIAGNOSIS — M069 Rheumatoid arthritis, unspecified: Secondary | ICD-10-CM | POA: Diagnosis not present

## 2018-02-26 DIAGNOSIS — M5136 Other intervertebral disc degeneration, lumbar region: Secondary | ICD-10-CM | POA: Diagnosis not present

## 2018-02-26 DIAGNOSIS — N183 Chronic kidney disease, stage 3 (moderate): Secondary | ICD-10-CM | POA: Diagnosis not present

## 2018-02-26 DIAGNOSIS — I130 Hypertensive heart and chronic kidney disease with heart failure and stage 1 through stage 4 chronic kidney disease, or unspecified chronic kidney disease: Secondary | ICD-10-CM | POA: Diagnosis not present

## 2018-02-26 DIAGNOSIS — Z7982 Long term (current) use of aspirin: Secondary | ICD-10-CM | POA: Diagnosis not present

## 2018-02-26 DIAGNOSIS — Z9181 History of falling: Secondary | ICD-10-CM | POA: Diagnosis not present

## 2018-02-26 DIAGNOSIS — M797 Fibromyalgia: Secondary | ICD-10-CM | POA: Diagnosis not present

## 2018-02-26 DIAGNOSIS — F329 Major depressive disorder, single episode, unspecified: Secondary | ICD-10-CM | POA: Diagnosis not present

## 2018-02-26 DIAGNOSIS — K219 Gastro-esophageal reflux disease without esophagitis: Secondary | ICD-10-CM | POA: Diagnosis not present

## 2018-03-04 DIAGNOSIS — Z7982 Long term (current) use of aspirin: Secondary | ICD-10-CM | POA: Diagnosis not present

## 2018-03-04 DIAGNOSIS — K219 Gastro-esophageal reflux disease without esophagitis: Secondary | ICD-10-CM | POA: Diagnosis not present

## 2018-03-04 DIAGNOSIS — M5136 Other intervertebral disc degeneration, lumbar region: Secondary | ICD-10-CM | POA: Diagnosis not present

## 2018-03-04 DIAGNOSIS — I5030 Unspecified diastolic (congestive) heart failure: Secondary | ICD-10-CM | POA: Diagnosis not present

## 2018-03-04 DIAGNOSIS — G894 Chronic pain syndrome: Secondary | ICD-10-CM | POA: Diagnosis not present

## 2018-03-04 DIAGNOSIS — F329 Major depressive disorder, single episode, unspecified: Secondary | ICD-10-CM | POA: Diagnosis not present

## 2018-03-04 DIAGNOSIS — M797 Fibromyalgia: Secondary | ICD-10-CM | POA: Diagnosis not present

## 2018-03-04 DIAGNOSIS — M069 Rheumatoid arthritis, unspecified: Secondary | ICD-10-CM | POA: Diagnosis not present

## 2018-03-04 DIAGNOSIS — Z9181 History of falling: Secondary | ICD-10-CM | POA: Diagnosis not present

## 2018-03-04 DIAGNOSIS — N183 Chronic kidney disease, stage 3 (moderate): Secondary | ICD-10-CM | POA: Diagnosis not present

## 2018-03-04 DIAGNOSIS — I130 Hypertensive heart and chronic kidney disease with heart failure and stage 1 through stage 4 chronic kidney disease, or unspecified chronic kidney disease: Secondary | ICD-10-CM | POA: Diagnosis not present

## 2018-03-06 DIAGNOSIS — N183 Chronic kidney disease, stage 3 (moderate): Secondary | ICD-10-CM | POA: Diagnosis not present

## 2018-03-06 DIAGNOSIS — I130 Hypertensive heart and chronic kidney disease with heart failure and stage 1 through stage 4 chronic kidney disease, or unspecified chronic kidney disease: Secondary | ICD-10-CM | POA: Diagnosis not present

## 2018-03-06 DIAGNOSIS — M47897 Other spondylosis, lumbosacral region: Secondary | ICD-10-CM | POA: Diagnosis not present

## 2018-03-06 DIAGNOSIS — M542 Cervicalgia: Secondary | ICD-10-CM | POA: Diagnosis not present

## 2018-03-06 DIAGNOSIS — F329 Major depressive disorder, single episode, unspecified: Secondary | ICD-10-CM | POA: Diagnosis not present

## 2018-03-06 DIAGNOSIS — Z9181 History of falling: Secondary | ICD-10-CM | POA: Diagnosis not present

## 2018-03-06 DIAGNOSIS — Z79899 Other long term (current) drug therapy: Secondary | ICD-10-CM | POA: Diagnosis not present

## 2018-03-06 DIAGNOSIS — K219 Gastro-esophageal reflux disease without esophagitis: Secondary | ICD-10-CM | POA: Diagnosis not present

## 2018-03-06 DIAGNOSIS — M797 Fibromyalgia: Secondary | ICD-10-CM | POA: Diagnosis not present

## 2018-03-06 DIAGNOSIS — Z7982 Long term (current) use of aspirin: Secondary | ICD-10-CM | POA: Diagnosis not present

## 2018-03-06 DIAGNOSIS — M5136 Other intervertebral disc degeneration, lumbar region: Secondary | ICD-10-CM | POA: Diagnosis not present

## 2018-03-06 DIAGNOSIS — M069 Rheumatoid arthritis, unspecified: Secondary | ICD-10-CM | POA: Diagnosis not present

## 2018-03-06 DIAGNOSIS — M545 Low back pain: Secondary | ICD-10-CM | POA: Diagnosis not present

## 2018-03-06 DIAGNOSIS — G894 Chronic pain syndrome: Secondary | ICD-10-CM | POA: Diagnosis not present

## 2018-03-06 DIAGNOSIS — I5030 Unspecified diastolic (congestive) heart failure: Secondary | ICD-10-CM | POA: Diagnosis not present

## 2018-03-10 DIAGNOSIS — K219 Gastro-esophageal reflux disease without esophagitis: Secondary | ICD-10-CM | POA: Diagnosis not present

## 2018-03-10 DIAGNOSIS — Z9181 History of falling: Secondary | ICD-10-CM | POA: Diagnosis not present

## 2018-03-10 DIAGNOSIS — N183 Chronic kidney disease, stage 3 (moderate): Secondary | ICD-10-CM | POA: Diagnosis not present

## 2018-03-10 DIAGNOSIS — I5030 Unspecified diastolic (congestive) heart failure: Secondary | ICD-10-CM | POA: Diagnosis not present

## 2018-03-10 DIAGNOSIS — M5136 Other intervertebral disc degeneration, lumbar region: Secondary | ICD-10-CM | POA: Diagnosis not present

## 2018-03-10 DIAGNOSIS — G894 Chronic pain syndrome: Secondary | ICD-10-CM | POA: Diagnosis not present

## 2018-03-10 DIAGNOSIS — Z7982 Long term (current) use of aspirin: Secondary | ICD-10-CM | POA: Diagnosis not present

## 2018-03-10 DIAGNOSIS — I130 Hypertensive heart and chronic kidney disease with heart failure and stage 1 through stage 4 chronic kidney disease, or unspecified chronic kidney disease: Secondary | ICD-10-CM | POA: Diagnosis not present

## 2018-03-10 DIAGNOSIS — M797 Fibromyalgia: Secondary | ICD-10-CM | POA: Diagnosis not present

## 2018-03-10 DIAGNOSIS — F329 Major depressive disorder, single episode, unspecified: Secondary | ICD-10-CM | POA: Diagnosis not present

## 2018-03-10 DIAGNOSIS — M069 Rheumatoid arthritis, unspecified: Secondary | ICD-10-CM | POA: Diagnosis not present

## 2018-03-18 DIAGNOSIS — M14671 Charcot's joint, right ankle and foot: Secondary | ICD-10-CM | POA: Diagnosis not present

## 2018-03-18 DIAGNOSIS — M150 Primary generalized (osteo)arthritis: Secondary | ICD-10-CM | POA: Diagnosis not present

## 2018-03-18 DIAGNOSIS — M0609 Rheumatoid arthritis without rheumatoid factor, multiple sites: Secondary | ICD-10-CM | POA: Diagnosis not present

## 2018-03-18 DIAGNOSIS — Z79899 Other long term (current) drug therapy: Secondary | ICD-10-CM | POA: Diagnosis not present

## 2018-04-08 DIAGNOSIS — M47897 Other spondylosis, lumbosacral region: Secondary | ICD-10-CM | POA: Diagnosis not present

## 2018-04-08 DIAGNOSIS — G894 Chronic pain syndrome: Secondary | ICD-10-CM | POA: Diagnosis not present

## 2018-04-08 DIAGNOSIS — M545 Low back pain: Secondary | ICD-10-CM | POA: Diagnosis not present

## 2018-04-29 ENCOUNTER — Ambulatory Visit (INDEPENDENT_AMBULATORY_CARE_PROVIDER_SITE_OTHER): Payer: PPO

## 2018-04-29 ENCOUNTER — Ambulatory Visit (INDEPENDENT_AMBULATORY_CARE_PROVIDER_SITE_OTHER): Payer: PPO | Admitting: Orthopedic Surgery

## 2018-04-29 ENCOUNTER — Encounter (INDEPENDENT_AMBULATORY_CARE_PROVIDER_SITE_OTHER): Payer: Self-pay | Admitting: Orthopedic Surgery

## 2018-04-29 DIAGNOSIS — M25562 Pain in left knee: Secondary | ICD-10-CM | POA: Diagnosis not present

## 2018-04-29 DIAGNOSIS — M25552 Pain in left hip: Secondary | ICD-10-CM

## 2018-04-29 NOTE — Progress Notes (Signed)
Office Visit Note   Patient: Dominique Mckee           Date of Birth: 1930-04-01           MRN: [id number] Visit Date: 04/29/2018 Requested by: Wenda Low, MD [address] [place], [place] [address] PCP: Wenda Low, MD  Subjective: Chief Complaint  Patient presents with  . Left Hip - Pain  . Left Knee - Pain    HPI: Dominique Mckee is a patient with left hip and leg pain as well as some mild knee pain.  Denies any history of injury to the hip.  She is had pain for several weeks.  She reports groin pain as well as well as pain that radiates down the front of her leg.  She denies any numbness and tingling.  She also states that she bumped her knee and bruised it on the medial aspect.  She did have extensive back surgery 1 year ago.  She was in physical therapy and walked up to 35 steps but then her therapy benefit ran out.  She is taking oxycodone as well as methotrexate.  Her other medications are also reviewed.              ROS: All systems reviewed are negative as they relate to the chief complaint within the history of present illness.  Patient denies  fevers or chills.   Assessment & Plan: Visit Diagnoses:  1. Acute pain of left knee   2. Pain in left hip     Plan: Impression is left hip pain which may be referred pain from her back.  She does have weakness in left hip flexion.  No real significant groin pain with internal/external rotation today.  The knee has no effusion and stable collateral cruciate ligaments.  Plan is left hip injection in [facility] imaging for diagnostic and therapeutic purposes.  If that does not help then I think she may need to go back and see her operating physician for further evaluation and management of what appears to be post back surgery weakness new onset in the left leg  Follow-Up Instructions: Return if symptoms worsen or fail to improve.   Orders:  Orders Placed This Encounter  Procedures  . XR HIP UNILAT W OR W/O PELVIS 2-3 VIEWS  LEFT  . XR Knee 1-2 Views Left  . DG Arthro Hip Left   No orders of the defined types were placed in this encounter.     Procedures: No procedures performed   Clinical Data: No additional findings.  Objective: Vital Signs: There were no vitals taken for this visit.  Physical Exam:   Constitutional: Patient appears well-developed HEENT:  Head: Normocephalic Eyes:EOM are normal Neck: Normal range of motion Cardiovascular: Normal rate Pulmonary/chest: Effort normal Neurologic: Patient is alert Skin: Skin is warm Psychiatric: Patient has normal mood and affect    Ortho Exam: Orthopedic exam demonstrates palpable pedal pulses.  Patient has no definite paresthesias L1 S1 bilaterally.  Negative clonus.  Does have weakness in hip flexion on the left at 3- out of 5 compared to the right 5+ out of 5.  No groin pain with internal/external rotation of either leg.  Leg extension strength is about 5- out of 5 on the left 5 out of 5 on the right.  No effusion in the left knee.  Extensor mechanism is intact.  Collateral and cruciate ligaments are stable.  Very small bruises noted at the medial aspect of the distal  femur consistent with her contusion injury  Specialty Comments:  No specialty comments available.  Imaging: Xr Hip Unilat W Or W/o Pelvis 2-3 Views Left  Result Date: 04/29/2018 AP pelvis lateral left hip reviewed.  No fracture is present.  Mild degenerative changes noted.  Some osteopenia present.  Fusion from the lumbar spine to the sacrum appears to be in good position and alignment with no evidence of loose hardware  Xr Knee 1-2 Views Left  Result Date: 04/29/2018 AP lateral left knee reviewed.  Slight medial joint space narrowing is present.  No bone-on-bone changes noted and no spurring present.  Osteopenia present.  Patella height normal in relation to the femur.    PMFS History: Patient Active Problem List   Diagnosis Date Noted  . Acute diastolic congestive heart  failure ([place]) 07/08/2015  . Acute respiratory failure with hypoxia ([place]) 07/08/2015  . Shoulder dislocation, recurrent 07/06/2015  . HTN (hypertension) 07/06/2015  . Rheumatoid arthritis ([place]) 07/06/2015  . GERD (gastroesophageal reflux disease) 07/06/2015  . Shoulder dislocation   . Concussion with no loss of consciousness 12/30/2013  . Cervical spondylosis without myelopathy 01/01/2013  . Migraine without aura 01/01/2013  . Abnormality of gait 01/01/2013  . Essential and other specified forms of tremor 01/01/2013   Past Medical History:  Diagnosis Date  . Arthritis   . Benign essential tremor   . Cervical spondylosis   . Cervicogenic headache   . CHF (congestive heart failure) ([place])   . Chronic renal insufficiency   . Degenerative disc disease   . Depression   . Fibromyalgia   . Gait disorder   . GERD (gastroesophageal reflux disease)   . Heart murmur   . HOH (hard of hearing)   . Hypertension   . Lumbar spondylosis   . Migraine headache   . Mild obesity   . Osteopenia   . Rheumatoid arthritis ([place])     Family History  Problem Relation Age of Onset  . Osteoporosis Mother   . Intracerebral hemorrhage Mother 25  . AAA (abdominal aortic aneurysm) Father 42  . Lung cancer Brother   . Diabetes Brother   . Lung cancer Brother   . Diabetes Sister   . Lung cancer Sister   . Bone cancer Sister   . Kidney failure Son        left kidney transplant  . Diabetes type I Daughter   . Anesthesia problems Neg Hx   . Hypertension Neg Hx     Past Surgical History:  Procedure Laterality Date  . ABDOMINAL HYSTERECTOMY    . APPENDECTOMY    . CHOLECYSTECTOMY    . COLONOSCOPY    . ELBOW SURGERY Left   . HAMMER TOE SURGERY Left   . LUMBAR FUSION  02/2017  . SHOULDER CLOSED REDUCTION Right 07/08/2015   Procedure: RIGHT SHOULDER CLOSED REDUCTION;  Surgeon: Meredith Pel, MD;  Location: [facility];  Service: Orthopedics;  Laterality: Right;  . SHOULDER CLOSED REDUCTION Left  08/23/2017   Procedure: CLOSED REDUCTION LEFT SHOULDER;  Surgeon: Meredith Pel, MD;  Location: [facility];  Service: Orthopedics;  Laterality: Left;  . TONSILLECTOMY    . TOTAL KNEE ARTHROPLASTY Right    right   Social History   Occupational History  . Occupation: Retired    Fish farm manager: RETIRED  Tobacco Use  . Smoking status: Never Smoker  . Smokeless tobacco: Never Used  Substance and Sexual Activity  . Alcohol use: No  . Drug use: No  .  Sexual activity: Not Currently

## 2018-05-17 IMAGING — CT CT HEAD W/O CM
4 of 11 series · 15 of 47 positions shown, 17 images · non-contrast
Comparison: MRI/MRA of the brain performed 05/30/2011, CT of the
head and cervical spine performed 11/19/2013, and MRI of the
cervical spine performed 06/21/2015

CLINICAL DATA: Status post fall, with facial bruising and concern
for loosened teeth. Concern for head or cervical spine injury.
Initial encounter.

EXAM:
CT HEAD WITHOUT CONTRAST
CT MAXILLOFACIAL WITHOUT CONTRAST
CT CERVICAL SPINE WITHOUT CONTRAST
TECHNIQUE: Multidetector CT imaging of the head, cervical spine, and
maxillofacial structures were performed using the standard protocol
without intravenous contrast. Multiplanar CT image reconstructions
of the cervical spine and maxillofacial structures were also
generated.

[Series 5: facial/ orbits 2.0 h30s · axial · 0.34mm/px · z∈[-167,-59]mm · 7 of 74 slices shown, 9 images]
[im 10/74  brain]
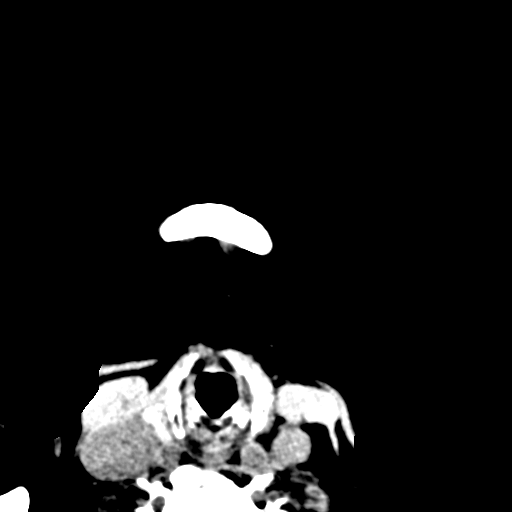
[im 10/74  bone]
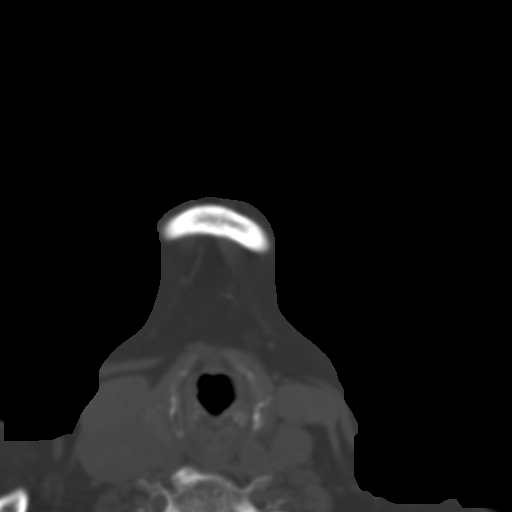
[im 19/74  brain]
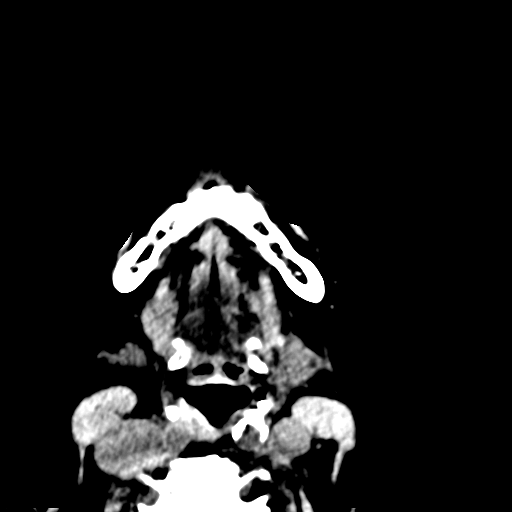
[im 28/74  brain]
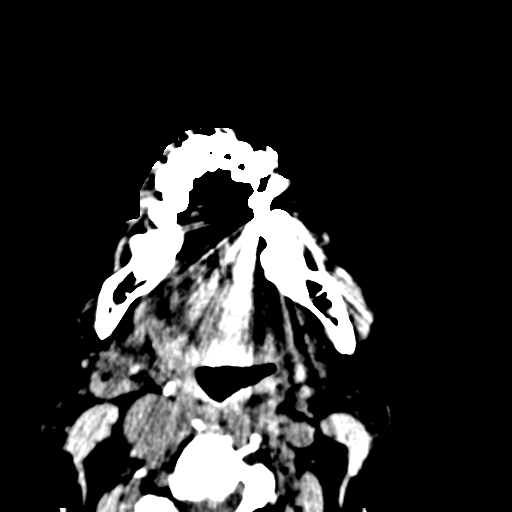
[im 37/74  brain]
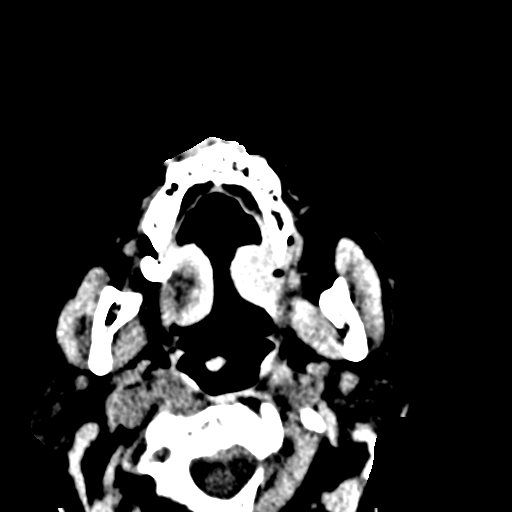
[im 46/74  brain]
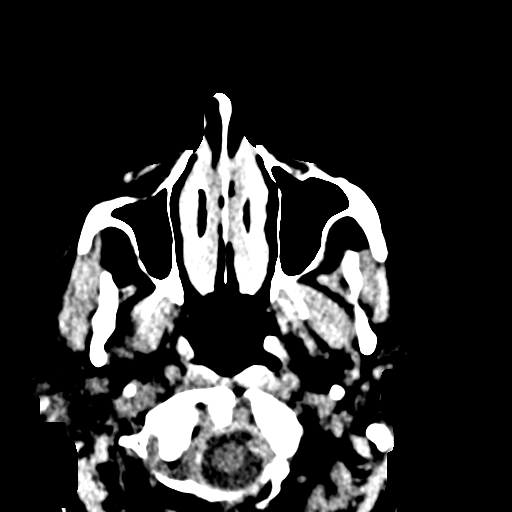
[im 46/74  bone]
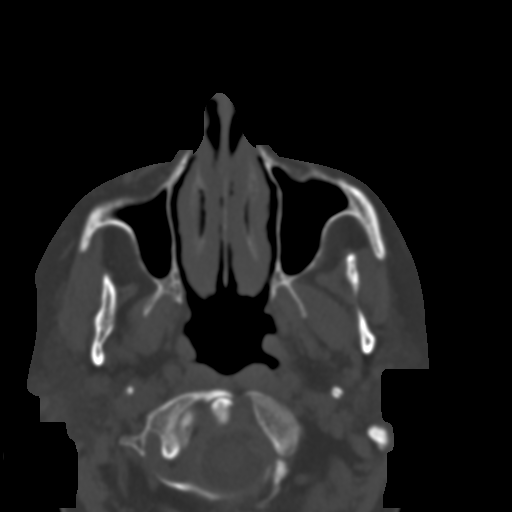
[im 55/74  brain]
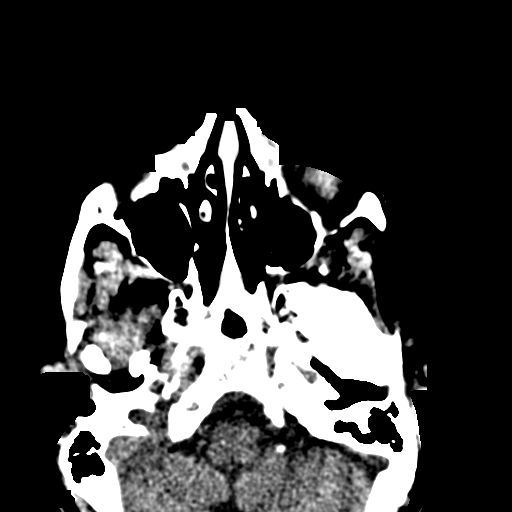
[im 64/74  brain]
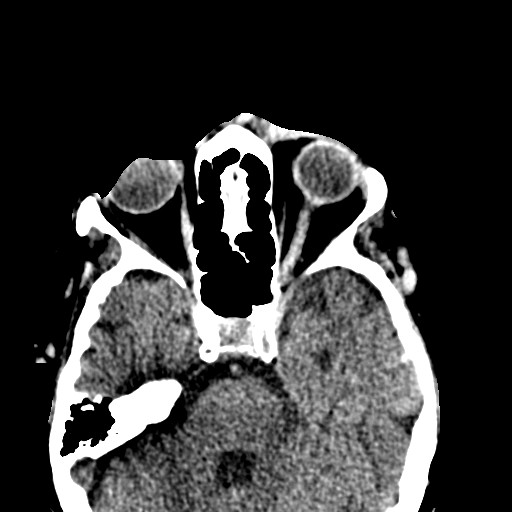

[Series 12: coronal soft tissue · coronal · 0.29mm/px · 1 of 79 slices shown]
[im 40/79  brain]
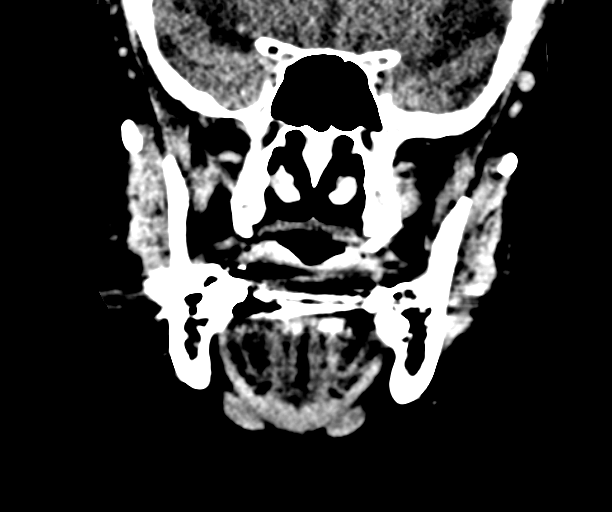

[Series 13: sagittal soft tissue · sagittal · 0.31mm/px · 1 of 70 slices shown]
[im 35/70  brain]
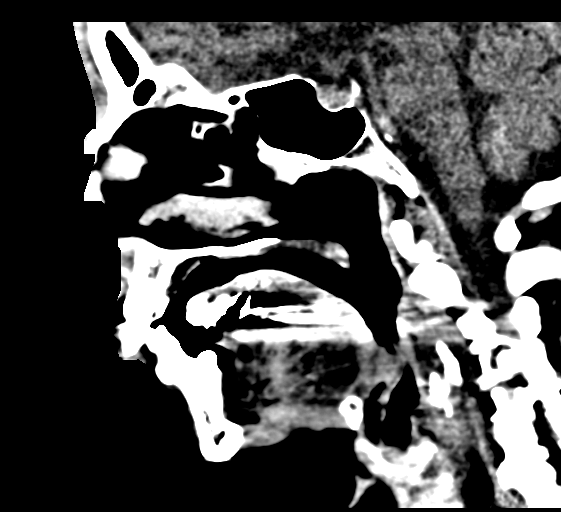

[Series 19: orthogonal axials · axial · 0.23mm/px · z∈[-214,-123]mm · 6 of 78 slices shown]
[im 10/78  brain]
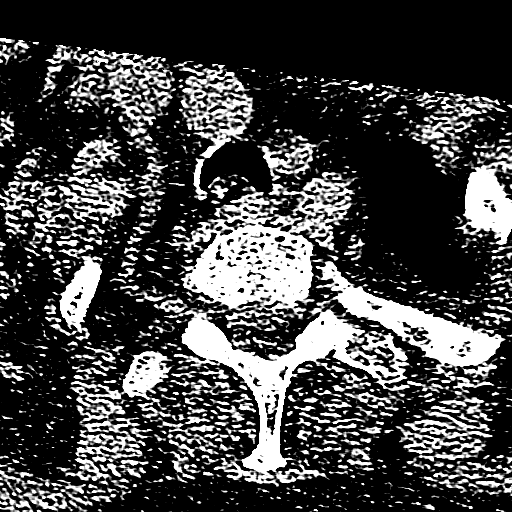
[im 20/78  brain]
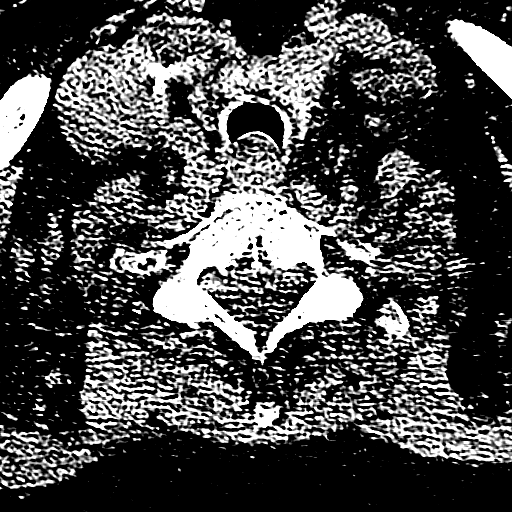
[im 29/78  brain]
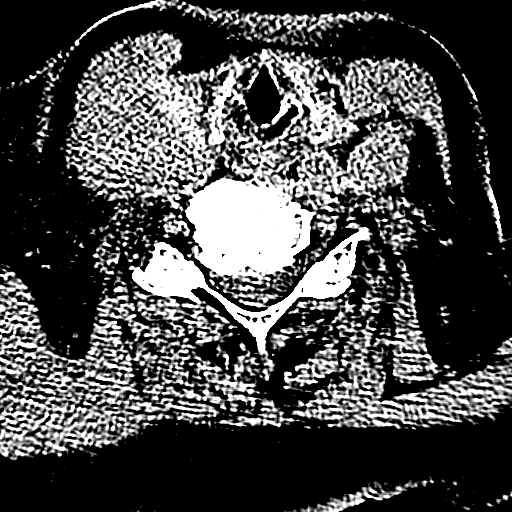
[im 39/78  brain]
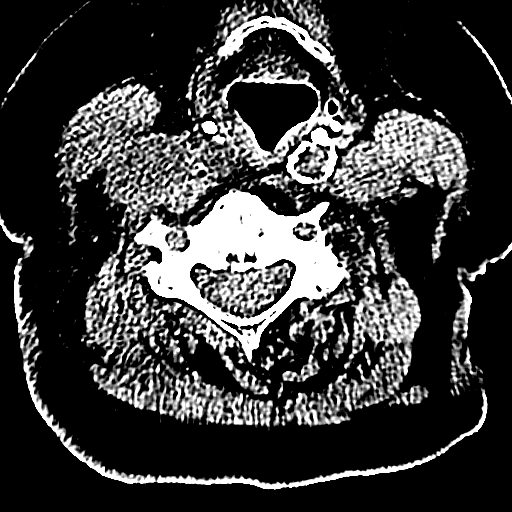
[im 49/78  brain]
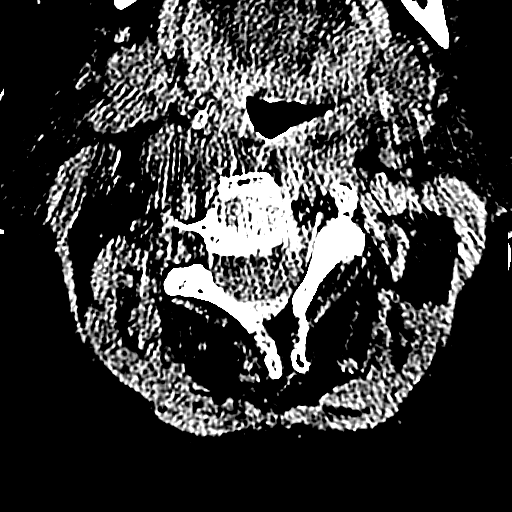
[im 58/78  brain]
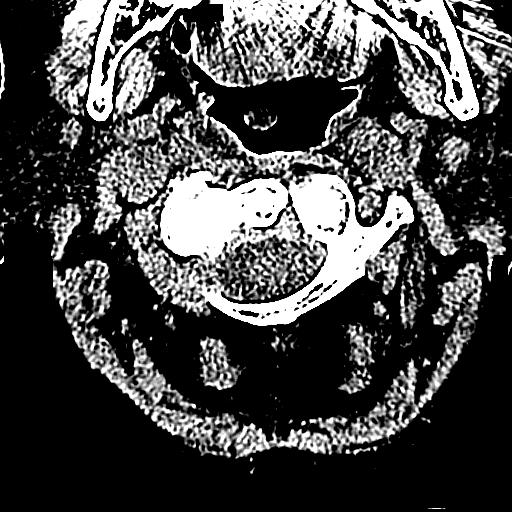

[15 of 47 positions shown; findings below may reference images not displayed]

FINDINGS: CT HEAD FINDINGS

There is no evidence of acute infarction, mass lesion, or intra- or
extra-axial hemorrhage on CT.

Prominence of the ventricles and sulci reflects mild cortical volume
loss. Scattered periventricular and subcortical white matter change
likely reflects small vessel ischemic microangiopathy. Mild
cerebellar atrophy is noted.

The brainstem and fourth ventricle are within normal limits. The
basal ganglia are unremarkable in appearance. The cerebral
hemispheres demonstrate grossly normal gray-white differentiation.
No mass effect or midline shift is seen.

There is no evidence of fracture; visualized osseous structures are
unremarkable in appearance. The visualized portions of the orbits
are within normal limits. The paranasal sinuses and mastoid air
cells are well-aerated. Soft tissue swelling is noted overlying the
frontal calvarium, extending overlying the nose and about the left
orbit.

CT MAXILLOFACIAL FINDINGS

There is no evidence of fracture or dislocation. The maxilla and
mandible appear intact. The nasal bone is unremarkable in
appearance. The remaining dentition appears grossly intact; there is
mild additional bone formation about the mandibular teeth, which may
reflect remote injury. Significant degenerative change is noted at
the temporomandibular joints bilaterally, with flattening of the
mandibular condylar heads.

The orbits are intact bilaterally. The visualized paranasal sinuses
and mastoid air cells are well-aerated.

Soft tissue swelling is noted overlying the frontal calvarium,
extending overlying the nose and about the left orbit. The
parapharyngeal fat planes are preserved. The nasopharynx, oropharynx
and hypopharynx are unremarkable in appearance. The visualized
portions of the valleculae and piriform sinuses are grossly
unremarkable.

The parotid and submandibular glands are within normal limits. No
cervical lymphadenopathy is seen.

CT CERVICAL SPINE FINDINGS

There is no evidence of fracture or subluxation. Vertebral bodies
demonstrate normal height. There is mild grade 1 anterolisthesis of
C3 on C4, and multilevel disc space narrowing is noted along the
cervical spine, with scattered anterior and posterior disc
osteophyte complexes. Underlying facet disease is noted.

The thyroid gland is unremarkable in appearance. The visualized lung
apices are clear. Prominent calcification is noted at the left
carotid bifurcation. The common carotid arteries are partially
retropharyngeal in course.
IMPRESSION: 1. No evidence of traumatic intracranial injury or fracture.
2. No evidence of fracture or subluxation along the cervical spine.
3. No evidence of fracture or dislocation with regard to the
maxillofacial structures.
4. Soft swelling overlying the frontal calvarium, extending
overlying the nose and about the left orbit.
5. Mild cortical volume loss and scattered small vessel ischemic
microangiopathy.
6. Degenerative change at the temporomandibular joints bilaterally,
with flattening of the mandibular condylar heads.
7. Mild degenerative change along the cervical spine.
8. Prominent calcification at the left carotid bifurcation. Carotid
ultrasound could be considered for further evaluation, when and as
deemed clinically appropriate.

## 2018-05-17 IMAGING — DX DG FOREARM 2V*R*
2 series · 2 of 2 positions shown · non-contrast
Comparison: Right wrist radiographs performed 06/23/2015

CLINICAL DATA: Status post fall, with bruising along the anterior
right forearm. Initial encounter.

EXAM:
RIGHT FOREARM - 2 VIEW

[forearm ap]
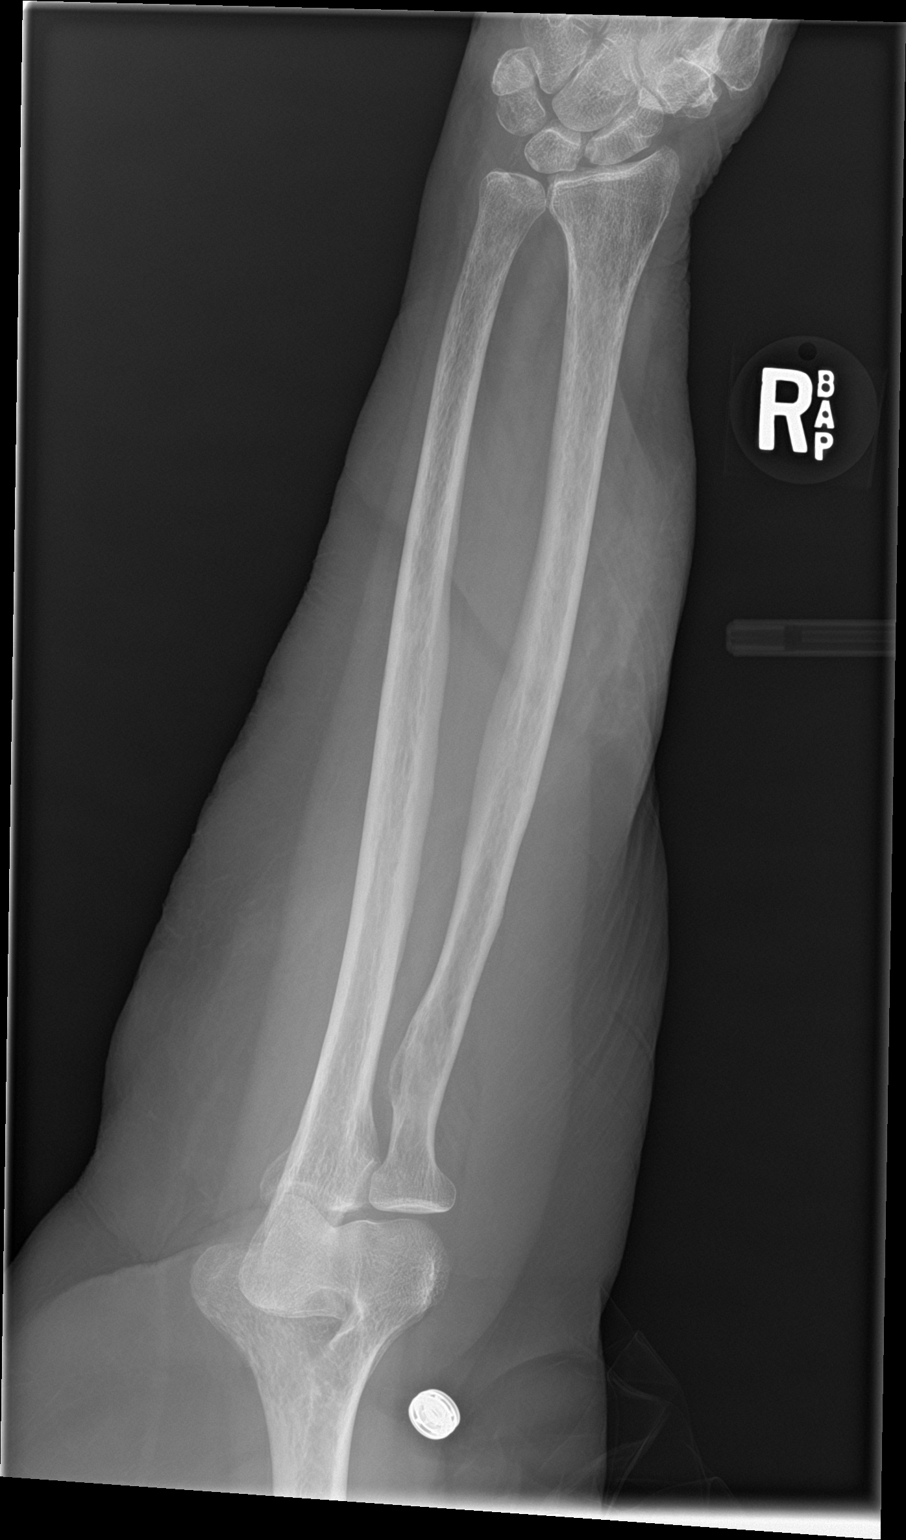

[forearm lat]
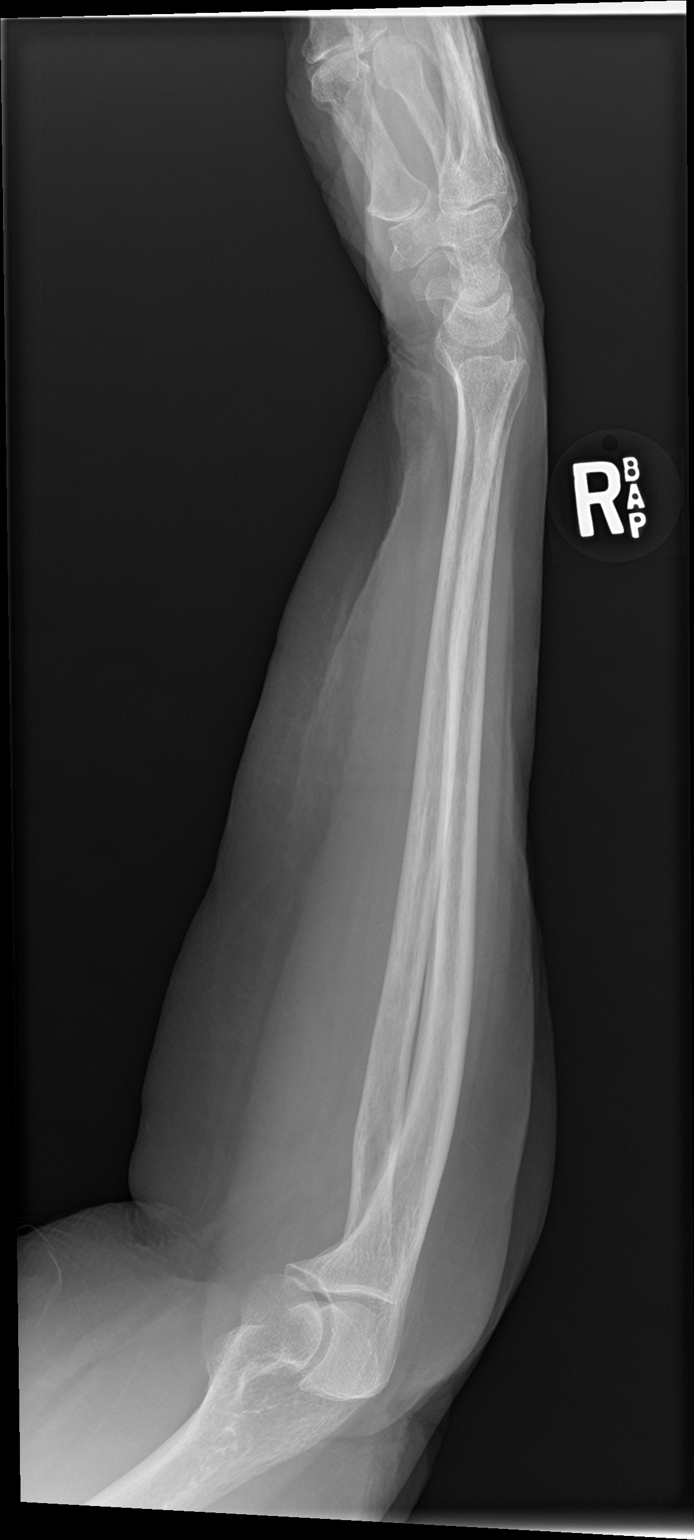

[2 of 2 positions shown; findings below may reference images not displayed]

FINDINGS: Soft tissue swelling is noted along the radial volar aspect of the
forearm.

There is no evidence of fracture or dislocation. The radius and ulna
appear grossly intact. The elbow joint is incompletely assessed, but
appears grossly unremarkable. The carpal rows are grossly
unremarkable in appearance. Minimal degenerative change is noted at
the first carpometacarpal joint.
IMPRESSION: Soft tissue swelling along the radial volar aspect of the forearm.
No evidence of fracture or dislocation.

## 2018-05-20 DIAGNOSIS — R531 Weakness: Secondary | ICD-10-CM | POA: Diagnosis not present

## 2018-05-20 DIAGNOSIS — M14671 Charcot's joint, right ankle and foot: Secondary | ICD-10-CM | POA: Diagnosis not present

## 2018-05-20 DIAGNOSIS — R269 Unspecified abnormalities of gait and mobility: Secondary | ICD-10-CM | POA: Diagnosis not present

## 2018-05-20 DIAGNOSIS — S43004S Unspecified dislocation of right shoulder joint, sequela: Secondary | ICD-10-CM | POA: Diagnosis not present

## 2018-05-20 DIAGNOSIS — B351 Tinea unguium: Secondary | ICD-10-CM | POA: Diagnosis not present

## 2018-05-20 DIAGNOSIS — Z79899 Other long term (current) drug therapy: Secondary | ICD-10-CM | POA: Diagnosis not present

## 2018-05-20 DIAGNOSIS — M0609 Rheumatoid arthritis without rheumatoid factor, multiple sites: Secondary | ICD-10-CM | POA: Diagnosis not present

## 2018-05-20 DIAGNOSIS — M069 Rheumatoid arthritis, unspecified: Secondary | ICD-10-CM | POA: Diagnosis not present

## 2018-05-20 DIAGNOSIS — R609 Edema, unspecified: Secondary | ICD-10-CM | POA: Diagnosis not present

## 2018-05-20 DIAGNOSIS — I5189 Other ill-defined heart diseases: Secondary | ICD-10-CM | POA: Diagnosis not present

## 2018-05-20 DIAGNOSIS — M150 Primary generalized (osteo)arthritis: Secondary | ICD-10-CM | POA: Diagnosis not present

## 2018-05-23 DIAGNOSIS — M069 Rheumatoid arthritis, unspecified: Secondary | ICD-10-CM | POA: Diagnosis not present

## 2018-05-23 DIAGNOSIS — M25552 Pain in left hip: Secondary | ICD-10-CM | POA: Diagnosis not present

## 2018-05-23 DIAGNOSIS — M25511 Pain in right shoulder: Secondary | ICD-10-CM | POA: Diagnosis not present

## 2018-05-23 DIAGNOSIS — M1991 Primary osteoarthritis, unspecified site: Secondary | ICD-10-CM | POA: Diagnosis not present

## 2018-05-23 DIAGNOSIS — B351 Tinea unguium: Secondary | ICD-10-CM | POA: Diagnosis not present

## 2018-05-23 DIAGNOSIS — I130 Hypertensive heart and chronic kidney disease with heart failure and stage 1 through stage 4 chronic kidney disease, or unspecified chronic kidney disease: Secondary | ICD-10-CM | POA: Diagnosis not present

## 2018-05-23 DIAGNOSIS — I5031 Acute diastolic (congestive) heart failure: Secondary | ICD-10-CM | POA: Diagnosis not present

## 2018-05-23 DIAGNOSIS — S060X0D Concussion without loss of consciousness, subsequent encounter: Secondary | ICD-10-CM | POA: Diagnosis not present

## 2018-05-23 DIAGNOSIS — M47812 Spondylosis without myelopathy or radiculopathy, cervical region: Secondary | ICD-10-CM | POA: Diagnosis not present

## 2018-05-23 DIAGNOSIS — M25562 Pain in left knee: Secondary | ICD-10-CM | POA: Diagnosis not present

## 2018-05-23 DIAGNOSIS — N183 Chronic kidney disease, stage 3 (moderate): Secondary | ICD-10-CM | POA: Diagnosis not present

## 2018-05-23 DIAGNOSIS — M47816 Spondylosis without myelopathy or radiculopathy, lumbar region: Secondary | ICD-10-CM | POA: Diagnosis not present

## 2018-05-23 DIAGNOSIS — G8929 Other chronic pain: Secondary | ICD-10-CM | POA: Diagnosis not present

## 2018-06-03 DIAGNOSIS — R609 Edema, unspecified: Secondary | ICD-10-CM | POA: Diagnosis not present

## 2018-06-05 ENCOUNTER — Ambulatory Visit: Payer: PPO | Admitting: Podiatry

## 2018-06-12 DIAGNOSIS — G8929 Other chronic pain: Secondary | ICD-10-CM | POA: Diagnosis not present

## 2018-06-12 DIAGNOSIS — M25552 Pain in left hip: Secondary | ICD-10-CM | POA: Diagnosis not present

## 2018-06-12 DIAGNOSIS — M25511 Pain in right shoulder: Secondary | ICD-10-CM | POA: Diagnosis not present

## 2018-06-12 DIAGNOSIS — N183 Chronic kidney disease, stage 3 (moderate): Secondary | ICD-10-CM | POA: Diagnosis not present

## 2018-06-12 DIAGNOSIS — I5031 Acute diastolic (congestive) heart failure: Secondary | ICD-10-CM | POA: Diagnosis not present

## 2018-06-12 DIAGNOSIS — I130 Hypertensive heart and chronic kidney disease with heart failure and stage 1 through stage 4 chronic kidney disease, or unspecified chronic kidney disease: Secondary | ICD-10-CM | POA: Diagnosis not present

## 2018-06-12 DIAGNOSIS — M47816 Spondylosis without myelopathy or radiculopathy, lumbar region: Secondary | ICD-10-CM | POA: Diagnosis not present

## 2018-06-12 DIAGNOSIS — M25562 Pain in left knee: Secondary | ICD-10-CM | POA: Diagnosis not present

## 2018-06-12 DIAGNOSIS — M069 Rheumatoid arthritis, unspecified: Secondary | ICD-10-CM | POA: Diagnosis not present

## 2018-06-12 DIAGNOSIS — M1991 Primary osteoarthritis, unspecified site: Secondary | ICD-10-CM | POA: Diagnosis not present

## 2018-06-12 DIAGNOSIS — B351 Tinea unguium: Secondary | ICD-10-CM | POA: Diagnosis not present

## 2018-06-12 DIAGNOSIS — M47812 Spondylosis without myelopathy or radiculopathy, cervical region: Secondary | ICD-10-CM | POA: Diagnosis not present

## 2018-06-12 DIAGNOSIS — S060X0D Concussion without loss of consciousness, subsequent encounter: Secondary | ICD-10-CM | POA: Diagnosis not present

## 2018-06-15 DIAGNOSIS — N183 Chronic kidney disease, stage 3 (moderate): Secondary | ICD-10-CM | POA: Diagnosis not present

## 2018-06-15 DIAGNOSIS — M25552 Pain in left hip: Secondary | ICD-10-CM | POA: Diagnosis not present

## 2018-06-15 DIAGNOSIS — G8929 Other chronic pain: Secondary | ICD-10-CM | POA: Diagnosis not present

## 2018-06-15 DIAGNOSIS — M069 Rheumatoid arthritis, unspecified: Secondary | ICD-10-CM | POA: Diagnosis not present

## 2018-06-15 DIAGNOSIS — I130 Hypertensive heart and chronic kidney disease with heart failure and stage 1 through stage 4 chronic kidney disease, or unspecified chronic kidney disease: Secondary | ICD-10-CM | POA: Diagnosis not present

## 2018-06-15 DIAGNOSIS — M47816 Spondylosis without myelopathy or radiculopathy, lumbar region: Secondary | ICD-10-CM | POA: Diagnosis not present

## 2018-06-15 DIAGNOSIS — S060X0D Concussion without loss of consciousness, subsequent encounter: Secondary | ICD-10-CM | POA: Diagnosis not present

## 2018-06-15 DIAGNOSIS — B351 Tinea unguium: Secondary | ICD-10-CM | POA: Diagnosis not present

## 2018-06-15 DIAGNOSIS — M47812 Spondylosis without myelopathy or radiculopathy, cervical region: Secondary | ICD-10-CM | POA: Diagnosis not present

## 2018-06-15 DIAGNOSIS — M25562 Pain in left knee: Secondary | ICD-10-CM | POA: Diagnosis not present

## 2018-06-15 DIAGNOSIS — M1991 Primary osteoarthritis, unspecified site: Secondary | ICD-10-CM | POA: Diagnosis not present

## 2018-06-15 DIAGNOSIS — I5031 Acute diastolic (congestive) heart failure: Secondary | ICD-10-CM | POA: Diagnosis not present

## 2018-06-15 DIAGNOSIS — M25511 Pain in right shoulder: Secondary | ICD-10-CM | POA: Diagnosis not present

## 2018-06-19 ENCOUNTER — Encounter: Payer: Self-pay | Admitting: Podiatry

## 2018-06-19 ENCOUNTER — Ambulatory Visit (INDEPENDENT_AMBULATORY_CARE_PROVIDER_SITE_OTHER): Payer: PPO | Admitting: Podiatry

## 2018-06-19 DIAGNOSIS — I999 Unspecified disorder of circulatory system: Secondary | ICD-10-CM

## 2018-06-19 DIAGNOSIS — B351 Tinea unguium: Secondary | ICD-10-CM

## 2018-06-19 DIAGNOSIS — M79609 Pain in unspecified limb: Principal | ICD-10-CM

## 2018-06-19 DIAGNOSIS — M79676 Pain in unspecified toe(s): Secondary | ICD-10-CM

## 2018-06-19 NOTE — Progress Notes (Signed)
   Subjective:    Patient ID: Dominique Mckee, female    DOB: Jan 21, 1930, 84 y.o.   MRN: [id number]  HPI    Review of Systems  All other systems reviewed and are negative.      Objective:   Physical Exam        Assessment & Plan:

## 2018-06-19 NOTE — Progress Notes (Signed)
Subjective:   Patient ID: Dominique Mckee, female   DOB: 84 y.o.   MRN: [id number]   HPI Patient presents with significant nail disease 1-5 both feet which are incurvated she cannot cut and also concerned about swelling on top of both feet is been present for around a month.  Patient does not smoke and is not active   Review of Systems  All other systems reviewed and are negative.       Objective:  Physical Exam  Constitutional: She appears well-developed and well-nourished.  Cardiovascular: Intact distal pulses.  Pulmonary/Chest: Effort normal.  Musculoskeletal: Normal range of motion.  Neurological: She is alert.  Skin: Skin is warm.  Nursing note and vitals reviewed.   Patient presents with weak pulses DP and PT bilateral with patient noted to have thick yellow incurvated nailbeds 1-5 both feet and redness on top of both feet with varicosities present.  Has good digital perfusion well oriented x3     Assessment:  Mycotic nail disease 1-5 both feet with pain with discoloration bilateral with mild to moderate vascular disease     Plan:  H&P and conditions discussed and explained.  At this point I have recommended nail debridement which was accomplished with no iatrogenic bleeding and that this was done routinely and will watch the vascular and I do not recommend consultation at the current time as I do not think there is anything that can be done for current condition.  Patient will be seen back for regular visit or earlier if needed

## 2018-06-24 DIAGNOSIS — M25511 Pain in right shoulder: Secondary | ICD-10-CM | POA: Diagnosis not present

## 2018-06-24 DIAGNOSIS — M069 Rheumatoid arthritis, unspecified: Secondary | ICD-10-CM | POA: Diagnosis not present

## 2018-06-24 DIAGNOSIS — I5031 Acute diastolic (congestive) heart failure: Secondary | ICD-10-CM | POA: Diagnosis not present

## 2018-06-24 DIAGNOSIS — G8929 Other chronic pain: Secondary | ICD-10-CM | POA: Diagnosis not present

## 2018-06-24 DIAGNOSIS — M0609 Rheumatoid arthritis without rheumatoid factor, multiple sites: Secondary | ICD-10-CM | POA: Diagnosis not present

## 2018-06-24 DIAGNOSIS — M47816 Spondylosis without myelopathy or radiculopathy, lumbar region: Secondary | ICD-10-CM | POA: Diagnosis not present

## 2018-06-24 DIAGNOSIS — Z79899 Other long term (current) drug therapy: Secondary | ICD-10-CM | POA: Diagnosis not present

## 2018-06-24 DIAGNOSIS — B351 Tinea unguium: Secondary | ICD-10-CM | POA: Diagnosis not present

## 2018-06-24 DIAGNOSIS — M25562 Pain in left knee: Secondary | ICD-10-CM | POA: Diagnosis not present

## 2018-06-24 DIAGNOSIS — M25552 Pain in left hip: Secondary | ICD-10-CM | POA: Diagnosis not present

## 2018-06-24 DIAGNOSIS — S060X0D Concussion without loss of consciousness, subsequent encounter: Secondary | ICD-10-CM | POA: Diagnosis not present

## 2018-06-24 DIAGNOSIS — M1991 Primary osteoarthritis, unspecified site: Secondary | ICD-10-CM | POA: Diagnosis not present

## 2018-06-24 DIAGNOSIS — I130 Hypertensive heart and chronic kidney disease with heart failure and stage 1 through stage 4 chronic kidney disease, or unspecified chronic kidney disease: Secondary | ICD-10-CM | POA: Diagnosis not present

## 2018-06-24 DIAGNOSIS — N183 Chronic kidney disease, stage 3 (moderate): Secondary | ICD-10-CM | POA: Diagnosis not present

## 2018-06-24 DIAGNOSIS — M47812 Spondylosis without myelopathy or radiculopathy, cervical region: Secondary | ICD-10-CM | POA: Diagnosis not present

## 2018-06-30 DIAGNOSIS — M069 Rheumatoid arthritis, unspecified: Secondary | ICD-10-CM | POA: Diagnosis not present

## 2018-06-30 DIAGNOSIS — G8929 Other chronic pain: Secondary | ICD-10-CM | POA: Diagnosis not present

## 2018-06-30 DIAGNOSIS — N183 Chronic kidney disease, stage 3 (moderate): Secondary | ICD-10-CM | POA: Diagnosis not present

## 2018-06-30 DIAGNOSIS — M47816 Spondylosis without myelopathy or radiculopathy, lumbar region: Secondary | ICD-10-CM | POA: Diagnosis not present

## 2018-06-30 DIAGNOSIS — M1991 Primary osteoarthritis, unspecified site: Secondary | ICD-10-CM | POA: Diagnosis not present

## 2018-06-30 DIAGNOSIS — I5031 Acute diastolic (congestive) heart failure: Secondary | ICD-10-CM | POA: Diagnosis not present

## 2018-06-30 DIAGNOSIS — S060X0D Concussion without loss of consciousness, subsequent encounter: Secondary | ICD-10-CM | POA: Diagnosis not present

## 2018-06-30 DIAGNOSIS — M47812 Spondylosis without myelopathy or radiculopathy, cervical region: Secondary | ICD-10-CM | POA: Diagnosis not present

## 2018-06-30 DIAGNOSIS — M25511 Pain in right shoulder: Secondary | ICD-10-CM | POA: Diagnosis not present

## 2018-06-30 DIAGNOSIS — M25562 Pain in left knee: Secondary | ICD-10-CM | POA: Diagnosis not present

## 2018-06-30 DIAGNOSIS — B351 Tinea unguium: Secondary | ICD-10-CM | POA: Diagnosis not present

## 2018-06-30 DIAGNOSIS — I130 Hypertensive heart and chronic kidney disease with heart failure and stage 1 through stage 4 chronic kidney disease, or unspecified chronic kidney disease: Secondary | ICD-10-CM | POA: Diagnosis not present

## 2018-06-30 DIAGNOSIS — M25552 Pain in left hip: Secondary | ICD-10-CM | POA: Diagnosis not present

## 2018-07-10 ENCOUNTER — Encounter (INDEPENDENT_AMBULATORY_CARE_PROVIDER_SITE_OTHER): Payer: Self-pay | Admitting: Family Medicine

## 2018-07-10 ENCOUNTER — Ambulatory Visit (INDEPENDENT_AMBULATORY_CARE_PROVIDER_SITE_OTHER): Payer: PPO | Admitting: Family Medicine

## 2018-07-10 DIAGNOSIS — M545 Low back pain: Secondary | ICD-10-CM | POA: Diagnosis not present

## 2018-07-10 DIAGNOSIS — M47897 Other spondylosis, lumbosacral region: Secondary | ICD-10-CM | POA: Diagnosis not present

## 2018-07-10 DIAGNOSIS — M25512 Pain in left shoulder: Secondary | ICD-10-CM | POA: Diagnosis not present

## 2018-07-10 DIAGNOSIS — G894 Chronic pain syndrome: Secondary | ICD-10-CM | POA: Diagnosis not present

## 2018-07-10 NOTE — Progress Notes (Signed)
Office Visit Note   Patient: Dominique Mckee           Date of Birth: 07/17/1930           MRN: [id number] Visit Date: 07/10/2018 Requested by: Wenda Low, MD [address] [place], [place] [address] PCP: Wenda Low, MD  Subjective: Chief Complaint  Patient presents with  . Right Shoulder - Pain  . Left Shoulder - Pain    Left shoulder hurts worse than the right, with decreased ROM.    HPI: She is 84 year old with left shoulder pain.  Right shoulder hurts as well, but the left has bothering her more recently.  She has a history of recurrent dislocations.  She does not feel like it is dislocated now, just painful with certain movements.  Tylenol and Voltaren gel seem to help.  Today she is feeling better than she was when she made the appointment.               ROS: She has rheumatoid arthritis, chronic kidney disease and hypertension with valve disease.  Objective: Vital Signs: There were no vitals taken for this visit.  Physical Exam:  Left shoulder: It looks and feels symmetric compared to the right.  I do not think she is dislocated.  She does have some tenderness in the lateral subacromial space.  Limited active range of motion.  Imaging: None today.  Assessment & Plan: 1.  Chronic left shoulder pain, with rheumatoid arthritis and history of recurrent dislocations. -Today she is feeling somewhat better.  She will continue with Tylenol and Voltaren gel as needed.  If symptoms worsen we could inject subacromial space.   Follow-Up Instructions: No follow-ups on file.     Procedures: None today.   PMFS History: Patient Active Problem List   Diagnosis Date Noted  . Status post lumbar spinal fusion 04/13/2017  . Degenerative lumbar spinal stenosis 03/15/2017  . CKD (chronic kidney disease) 01/11/2017  . Anemia 01/10/2017  . Chronic pain 01/10/2017  . Heart murmur 01/10/2017  . Acute diastolic congestive heart failure ([place]) 07/08/2015  . Acute  respiratory failure with hypoxia ([place]) 07/08/2015  . Shoulder dislocation, recurrent 07/06/2015  . HTN (hypertension) 07/06/2015  . Rheumatoid arthritis ([place]) 07/06/2015  . GERD (gastroesophageal reflux disease) 07/06/2015  . Shoulder dislocation   . Concussion with no loss of consciousness 12/30/2013  . Cervical spondylosis without myelopathy 01/01/2013  . Migraine without aura 01/01/2013  . Abnormality of gait 01/01/2013  . Essential and other specified forms of tremor 01/01/2013   Past Medical History:  Diagnosis Date  . Arthritis   . Benign essential tremor   . Cervical spondylosis   . Cervicogenic headache   . CHF (congestive heart failure) ([place])   . Chronic renal insufficiency   . Degenerative disc disease   . Depression   . Fibromyalgia   . Gait disorder   . GERD (gastroesophageal reflux disease)   . Heart murmur   . HOH (hard of hearing)   . Hypertension   . Lumbar spondylosis   . Migraine headache   . Mild obesity   . Osteopenia   . Rheumatoid arthritis ([place])     Family History  Problem Relation Age of Onset  . Osteoporosis Mother   . Intracerebral hemorrhage Mother 6  . AAA (abdominal aortic aneurysm) Father 32  . Lung cancer Brother   . Diabetes Brother   . Lung cancer Brother   . Diabetes Sister   .  Lung cancer Sister   . Bone cancer Sister   . Kidney failure Son        left kidney transplant  . Diabetes type I Daughter   . Anesthesia problems Neg Hx   . Hypertension Neg Hx     Past Surgical History:  Procedure Laterality Date  . ABDOMINAL HYSTERECTOMY    . APPENDECTOMY    . CHOLECYSTECTOMY    . COLONOSCOPY    . ELBOW SURGERY Left   . HAMMER TOE SURGERY Left   . LUMBAR FUSION  02/2017  . SHOULDER CLOSED REDUCTION Right 07/08/2015   Procedure: RIGHT SHOULDER CLOSED REDUCTION;  Surgeon: Meredith Pel, MD;  Location: [facility];  Service: Orthopedics;  Laterality: Right;  . SHOULDER CLOSED REDUCTION Left 08/23/2017   Procedure: CLOSED REDUCTION  LEFT SHOULDER;  Surgeon: Meredith Pel, MD;  Location: [facility];  Service: Orthopedics;  Laterality: Left;  . TONSILLECTOMY    . TOTAL KNEE ARTHROPLASTY Right    right   Social History   Occupational History  . Occupation: Retired    Fish farm manager: RETIRED  Tobacco Use  . Smoking status: Never Smoker  . Smokeless tobacco: Never Used  Substance and Sexual Activity  . Alcohol use: No  . Drug use: No  . Sexual activity: Not Currently

## 2018-08-07 DIAGNOSIS — G894 Chronic pain syndrome: Secondary | ICD-10-CM | POA: Diagnosis not present

## 2018-08-07 DIAGNOSIS — M47897 Other spondylosis, lumbosacral region: Secondary | ICD-10-CM | POA: Diagnosis not present

## 2018-08-07 DIAGNOSIS — M545 Low back pain: Secondary | ICD-10-CM | POA: Diagnosis not present

## 2018-08-07 DIAGNOSIS — M25511 Pain in right shoulder: Secondary | ICD-10-CM | POA: Diagnosis not present

## 2018-08-12 ENCOUNTER — Telehealth (INDEPENDENT_AMBULATORY_CARE_PROVIDER_SITE_OTHER): Payer: Self-pay | Admitting: *Deleted

## 2018-08-12 NOTE — Telephone Encounter (Signed)
Received fax from [facility] imaging spine center advising pt has appt scheduled for injection on 08/21/18 at 1:30pm, per fax pt aware of appt

## 2018-08-21 ENCOUNTER — Ambulatory Visit
Admission: RE | Admit: 2018-08-21 | Discharge: 2018-08-21 | Disposition: A | Discharge status: Home / Self Care | Payer: PPO | Source: Ambulatory Visit | Attending: Orthopedic Surgery | Admitting: Orthopedic Surgery

## 2018-08-21 ENCOUNTER — Telehealth (INDEPENDENT_AMBULATORY_CARE_PROVIDER_SITE_OTHER): Payer: Self-pay

## 2018-08-21 DIAGNOSIS — M25552 Pain in left hip: Secondary | ICD-10-CM

## 2018-08-21 MED ORDER — METHYLPREDNISOLONE ACETATE 40 MG/ML INJ SUSP (RADIOLOG: 120.0000 mg | Freq: Once | INTRAMUSCULAR | Status: DC

## 2018-08-21 MED ORDER — IOPAMIDOL (ISOVUE-M 200) INJECTION 41%: 1.0000 mL | Freq: Once | INTRAMUSCULAR | Status: DC

## 2018-08-21 NOTE — Telephone Encounter (Signed)
Ok thxc

## 2018-08-21 NOTE — Telephone Encounter (Signed)
FYI Received call from [facility] stated that patient was there for her hip injection today (we had ordered this back in August) Patient had advised [facility] she was not sure why she needed. Advised [facility] that we had ordered this earlier this summer and had not seen patient since July/August and if patient did not want to get injection, she did not have to. She advised [facility] that she would hold off on injection for now because she had some medication that she was going to try first.

## 2018-08-23 ENCOUNTER — Emergency Department (HOSPITAL_COMMUNITY): Payer: PPO

## 2018-08-23 ENCOUNTER — Emergency Department (HOSPITAL_COMMUNITY)
Admission: EM | Admit: 2018-08-23 | Discharge: 2018-08-23 | Disposition: A | Discharge status: Home / Self Care | Payer: PPO | Attending: Emergency Medicine | Admitting: Emergency Medicine

## 2018-08-23 ENCOUNTER — Encounter (HOSPITAL_COMMUNITY): Payer: Self-pay

## 2018-08-23 ENCOUNTER — Other Ambulatory Visit: Payer: Self-pay

## 2018-08-23 DIAGNOSIS — I509 Heart failure, unspecified: Secondary | ICD-10-CM | POA: Diagnosis not present

## 2018-08-23 DIAGNOSIS — W19XXXA Unspecified fall, initial encounter: Secondary | ICD-10-CM | POA: Diagnosis not present

## 2018-08-23 DIAGNOSIS — Y939 Activity, unspecified: Secondary | ICD-10-CM | POA: Diagnosis not present

## 2018-08-23 DIAGNOSIS — S4992XA Unspecified injury of left shoulder and upper arm, initial encounter: Secondary | ICD-10-CM | POA: Diagnosis present

## 2018-08-23 DIAGNOSIS — I4891 Unspecified atrial fibrillation: Secondary | ICD-10-CM | POA: Diagnosis not present

## 2018-08-23 DIAGNOSIS — Z7401 Bed confinement status: Secondary | ICD-10-CM | POA: Diagnosis not present

## 2018-08-23 DIAGNOSIS — S43005A Unspecified dislocation of left shoulder joint, initial encounter: Secondary | ICD-10-CM | POA: Insufficient documentation

## 2018-08-23 DIAGNOSIS — Y92129 Unspecified place in nursing home as the place of occurrence of the external cause: Secondary | ICD-10-CM | POA: Insufficient documentation

## 2018-08-23 DIAGNOSIS — R52 Pain, unspecified: Secondary | ICD-10-CM | POA: Diagnosis not present

## 2018-08-23 DIAGNOSIS — M069 Rheumatoid arthritis, unspecified: Secondary | ICD-10-CM | POA: Diagnosis not present

## 2018-08-23 DIAGNOSIS — Y999 Unspecified external cause status: Secondary | ICD-10-CM | POA: Insufficient documentation

## 2018-08-23 DIAGNOSIS — Z79899 Other long term (current) drug therapy: Secondary | ICD-10-CM | POA: Insufficient documentation

## 2018-08-23 DIAGNOSIS — X500XXA Overexertion from strenuous movement or load, initial encounter: Secondary | ICD-10-CM | POA: Insufficient documentation

## 2018-08-23 DIAGNOSIS — I10 Essential (primary) hypertension: Secondary | ICD-10-CM | POA: Diagnosis not present

## 2018-08-23 DIAGNOSIS — M2550 Pain in unspecified joint: Secondary | ICD-10-CM | POA: Diagnosis not present

## 2018-08-23 DIAGNOSIS — R0902 Hypoxemia: Secondary | ICD-10-CM | POA: Diagnosis not present

## 2018-08-23 DIAGNOSIS — S43015A Anterior dislocation of left humerus, initial encounter: Secondary | ICD-10-CM | POA: Diagnosis not present

## 2018-08-23 MED ORDER — FENTANYL CITRATE (PF) 100 MCG/2ML IJ SOLN
50.0000 ug | Freq: Once | INTRAMUSCULAR | Status: AC
  Administered 2018-08-23: 50 ug via INTRAMUSCULAR
  Filled 2018-08-23: qty 2

## 2018-08-23 MED ORDER — FENTANYL CITRATE (PF) 100 MCG/2ML IJ SOLN
INTRAMUSCULAR | Status: AC | PRN
  Administered 2018-08-23: 50 ug via INTRAVENOUS

## 2018-08-23 MED ORDER — MIDAZOLAM HCL 2 MG/2ML IJ SOLN
4.0000 mg | Freq: Once | INTRAMUSCULAR | Status: AC
  Administered 2018-08-23: 2 mg via INTRAVENOUS
  Filled 2018-08-23: qty 4

## 2018-08-23 MED ORDER — MIDAZOLAM HCL 2 MG/2ML IJ SOLN
INTRAMUSCULAR | Status: AC | PRN
  Administered 2018-08-23: 2 mg via INTRAVENOUS

## 2018-08-23 NOTE — ED Provider Notes (Signed)
[facility] DEPT Provider Note   CSN: [id number] Arrival date & time: 08/23/18  1232     History   Chief Complaint Chief Complaint  Patient presents with  . Shoulder Pain    HPI Dominique Mckee is a 84 y.o. female.  HPI   Dominique Mckee is an 84 year old female with a history of recurrent shoulder dislocations who presents to the emergency department from [facility] via EMS for evaluation of possible left shoulder dislocation.  Patient reports that she was reaching her left hand out in front of her to grab a bar when she felt her shoulder pop out of place.  She has had constant severe left anterior shoulder pain ever since.  She received 100 mcg of fentanyl in route.  She denies numbness, weakness, fall or injury elsewhere.  Past Medical History:  Diagnosis Date  . Arthritis   . Benign essential tremor   . Cervical spondylosis   . Cervicogenic headache   . CHF (congestive heart failure) ([place])   . Chronic renal insufficiency   . Degenerative disc disease   . Depression   . Fibromyalgia   . Gait disorder   . GERD (gastroesophageal reflux disease)   . Heart murmur   . HOH (hard of hearing)   . Hypertension   . Lumbar spondylosis   . Migraine headache   . Mild obesity   . Osteopenia   . Rheumatoid arthritis [facility])     Patient Active Problem List   Diagnosis Date Noted  . Status post lumbar spinal fusion 04/13/2017  . Degenerative lumbar spinal stenosis 03/15/2017  . CKD (chronic kidney disease) 01/11/2017  . Anemia 01/10/2017  . Chronic pain 01/10/2017  . Heart murmur 01/10/2017  . Acute diastolic congestive heart failure ([place]) 07/08/2015  . Acute respiratory failure with hypoxia ([place]) 07/08/2015  . Shoulder dislocation, recurrent 07/06/2015  . HTN (hypertension) 07/06/2015  . Rheumatoid arthritis ([place]) 07/06/2015  . GERD (gastroesophageal reflux disease) 07/06/2015  . Shoulder dislocation   . Concussion with no  loss of consciousness 12/30/2013  . Cervical spondylosis without myelopathy 01/01/2013  . Migraine without aura 01/01/2013  . Abnormality of gait 01/01/2013  . Essential and other specified forms of tremor 01/01/2013    Past Surgical History:  Procedure Laterality Date  . ABDOMINAL HYSTERECTOMY    . APPENDECTOMY    . CHOLECYSTECTOMY    . COLONOSCOPY    . ELBOW SURGERY Left   . HAMMER TOE SURGERY Left   . LUMBAR FUSION  02/2017  . SHOULDER CLOSED REDUCTION Right 07/08/2015   Procedure: RIGHT SHOULDER CLOSED REDUCTION;  Surgeon: Meredith Pel, MD;  Location: [facility];  Service: Orthopedics;  Laterality: Right;  . SHOULDER CLOSED REDUCTION Left 08/23/2017   Procedure: CLOSED REDUCTION LEFT SHOULDER;  Surgeon: Meredith Pel, MD;  Location: [facility];  Service: Orthopedics;  Laterality: Left;  . TONSILLECTOMY    . TOTAL KNEE ARTHROPLASTY Right    right     OB History    Gravida      Para      Term      Preterm      AB      Living  1     SAB      TAB      Ectopic      Multiple      Live Births               Home Medications  Prior to Admission medications   Medication Sig Start Date End Date Taking? Authorizing Provider  acetaminophen (TYLENOL) 500 MG tablet Take 500 mg by mouth every 6 (six) hours as needed for mild pain.   Yes [provider]  Calcium Carb-Cholecalciferol (CALCIUM 600+D3) 600-200 MG-UNIT TABS Take 1 tablet by mouth daily.   Yes [provider]  calcium carbonate (TUMS - DOSED IN MG ELEMENTAL CALCIUM) 500 MG chewable tablet Chew 1 tablet by mouth 2 (two) times daily.   Yes [provider]  diclofenac sodium (VOLTAREN) 1 % GEL Apply 1 application topically 2 (two) times daily as needed (pain).    Yes [provider]  DULoxetine (CYMBALTA) 30 MG capsule Take 30 mg by mouth daily. 10/05/17  Yes [provider]  feeding supplement, ENSURE ENLIVE, (ENSURE ENLIVE) LIQD Take 237 mLs by mouth 3 (three)  times daily between meals. 08/26/17  Yes Meredith Pel, MD  ferrous sulfate 325 (65 FE) MG tablet Take 325 mg by mouth every evening.   Yes [provider]  folic acid (FOLVITE) 1 MG tablet Take 1 mg by mouth daily.  12/29/13  Yes [provider]  furosemide (LASIX) 20 MG tablet Take 20 mg by mouth daily.   Yes [provider]  gabapentin (NEURONTIN) 300 MG capsule Take 900 mg by mouth at bedtime. 09/11/17  Yes [provider]  Melatonin 3 MG CAPS Take 3 mg by mouth at bedtime.   Yes [provider]  methotrexate (RHEUMATREX) 2.5 MG tablet Take 2.5 mg by mouth once a week. Caution:Chemotherapy. Protect from light.   Yes [provider]  oxyCODONE (ROXICODONE) 5 MG immediate release tablet Take 5 mg by mouth every 6 (six) hours as needed for severe pain.   Yes [provider]  pantoprazole (PROTONIX) 40 MG tablet Take 40 mg by mouth daily.   Yes [provider]  polyethylene glycol powder (MIRALAX) powder Take 17 g by mouth daily. 07/10/15  Yes Short, Noah Delaine, MD  propranolol (INDERAL) 20 MG tablet Take 1 tablet (20 mg total) by mouth 2 (two) times daily. 07/10/15  Yes Short, Noah Delaine, MD  senna (SENOKOT) 8.6 MG TABS tablet Take 2 tablets (17.2 mg total) by mouth at bedtime. 07/10/15  Yes Janece Canterbury, MD    Family History Family History  Problem Relation Age of Onset  . Osteoporosis Mother   . Intracerebral hemorrhage Mother 35  . AAA (abdominal aortic aneurysm) Father 70  . Lung cancer Brother   . Diabetes Brother   . Lung cancer Brother   . Diabetes Sister   . Lung cancer Sister   . Bone cancer Sister   . Kidney failure Son        left kidney transplant  . Diabetes type I Daughter   . Anesthesia problems Neg Hx   . Hypertension Neg Hx     Social History Social History   Tobacco Use  . Smoking status: Never Smoker  . Smokeless tobacco: Never Used  Substance Use Topics  . Alcohol use: No  . Drug  use: No     Allergies   Feldene [piroxicam]; Clinoril [sulindac]; and Morphine and related   Review of Systems Review of Systems  Musculoskeletal: Positive for arthralgias (left shoulder).  Neurological: Negative for weakness and numbness.  All other systems reviewed and are negative.    Physical Exam Updated Vital Signs BP (!) 161/95   Pulse 64   Temp 98.1 F (36.7 C)   Resp  18   Ht 5\' 3"  (1.6 m)   Wt 73.5 kg   SpO2 96%   BMI 28.70 kg/m   Physical Exam  Constitutional: She appears well-developed and well-nourished. No distress.  HENT:  Head: Normocephalic and atraumatic.  Eyes: Right eye exhibits no discharge. Left eye exhibits no discharge.  Neck: Normal range of motion.  Cardiovascular: Normal rate and regular rhythm.  Pulmonary/Chest: Effort normal. No respiratory distress.  Musculoskeletal:  Left humeral head anteriorly displaced with overlying humeral head tenderness. Radial pulses 2+ bilaterally. Distal sensation to light tough intact in bilateral UE.  Neurological: She is alert. Coordination normal.  Skin: Skin is warm and dry. She is not diaphoretic.  Psychiatric: She has a normal mood and affect. Her behavior is normal.  Nursing note and vitals reviewed.   ED Treatments / Results  Labs (all labs ordered are listed, but only abnormal results are displayed) Labs Reviewed - No data to display  EKG None  Radiology Dg Shoulder Left  Result Date: 08/23/2018 CLINICAL DATA:  Pt from [facility]. Pt was reaching out to press elevator button and felt left arm possibly dislocate. Pt has obvious deformity and left shoulder pain. Pt has had prior dislocations. EXAM: LEFT SHOULDER - 2+ VIEW COMPARISON:  08/23/2017 FINDINGS: The LEFT shoulder is anteriorly dislocated. Hill-Sachs deformity. The scapula appears intact. IMPRESSION: Anterior dislocation of the LEFT shoulder. Electronically Signed   By: Nolon Nations M.D.   On: 08/23/2018 15:48    Dg Shoulder Left Portable  Result Date: 08/23/2018 CLINICAL DATA:  Post reduction of left anterior inferior shoulder dislocation EXAM: LEFT SHOULDER - 1 VIEW COMPARISON:  Shoulder radiographs-earlier same day FINDINGS: Interval reduction of previously noted left anterior inferior shoulder dislocation, though note, a scapular Y radiograph was not provided. There is persistent inferior displacement the distal end of the left clavicle in relation to the acromion. No definite fracture or dislocation. Limited visualization of the adjacent thorax demonstrates minimal left basilar opacities favored to represent atelectasis. Regional soft tissues appear normal. IMPRESSION: 1. Interval reduction of left anterior inferior shoulder dislocation without associated fracture. 2. Persistent inferior displacement of the left clavicle in relation to the acromion as could be seen in the setting of an [facility] joint injury. Electronically Signed   By: Sandi Mariscal M.D.   On: 08/23/2018 16:24    Procedures Procedures (including critical care time)  Medications Ordered in ED Medications  fentaNYL (SUBLIMAZE) injection 50 mcg (50 mcg Intramuscular Given 08/23/18 1600)  midazolam (VERSED) injection 4 mg (2 mg Intravenous Given 08/23/18 1600)  midazolam (VERSED) injection (2 mg Intravenous Given 08/23/18 1600)  fentaNYL (SUBLIMAZE) injection (50 mcg Intravenous Given 08/23/18 1601)   This patients CHA2DS2-VASc Score and unadjusted Ischemic Stroke Rate (% per year) is equal to 7.2 % stroke rate/year from a score of 5  Above score calculated as 1 point each if present [CHF, HTN, DM, Vascular=MI/PAD/Aortic Plaque, Age if 65-74, or Female] Above score calculated as 2 points each if present [Age > 75, or Stroke/TIA/TE]   Initial Impression / Assessment and Plan / ED Course  I have reviewed the triage vital signs and the nursing notes.  Pertinent labs & imaging results that were available during my care of the patient were  reviewed by me and considered in my medical decision making (see chart for details).     X-ray reveals anterior left humeral head dislocation.  This was reduced under conscious sedation with Dr. Zenia Resides at the bedside.  Patient placed in sling.  Postreduction film reveals successful reduction, although persistent inferior displacement of the left clavicle in relation to the acromion which can be seen in the setting of [facility] joint injury.  Have counseled patient to follow-up with Dr. Marlou Sa on an outpatient basis.   Prior to the procedure patient was noted to be in atrial fibrillation, but she self converted to normal sinus rhythm after about 10 minutes. No prior history of atrial fibrillation. Patient's CHA2DS2VASc score 5. I discussed this patient with Cardiologist Dr. Nadyne Coombes who states that this may have been pain related, will likely need outpatient event monitor. He will follow up with patient in the office on an outpatient basis and determine if anti-coagulation is needed. Patient and family members at bedside informed and agree with this plan. Dc to [facility].   Final Clinical Impressions(s) / ED Diagnoses   Final diagnoses:  Dislocation of left shoulder joint, initial encounter  Atrial fibrillation, unspecified type [facility])    ED Discharge Orders    None       Bernarda Caffey 08/23/18 1819    Lacretia Leigh, MD 08/24/18 [phone]

## 2018-08-23 NOTE — ED Notes (Signed)
Report given to staff at [facility]. Son at bedside received discharge instructions. [facility] called for transport.

## 2018-08-23 NOTE — ED Notes (Signed)
PLACED PATIENT ON [place].

## 2018-08-23 NOTE — ED Triage Notes (Signed)
EMS reports from [facility], reaching out to press elevator button and felt left arm possibly dislocate. Obvious deformity and pain. Has had prior dislocations.   BP 194/100 HR 65 RR 18 Sp02 96 RA  11mcg Fentanyl intranasal enroute

## 2018-08-23 NOTE — ED Notes (Signed)
Bed: TM62 Expected date: 08/23/18 Expected time: 12:28 PM Means of arrival: Ambulance Comments: Shoulder dislocation

## 2018-08-23 NOTE — ED Notes (Signed)
CALLED PTS SON, JEFF, TO LET HIM KNOW THE PROCEDURE IS COMPLETE AND HE CAN RETURN TO THE PTS ROOM. NO ANSWER. LEFT VM.

## 2018-08-23 NOTE — ED Notes (Addendum)
Pt to be transported back to assisted living facility by [facility].

## 2018-08-23 NOTE — ED Provider Notes (Deleted)
Medical screening examination/treatment/procedure(s) were conducted as a shared visit with non-physician practitioner(s) and myself.  I personally evaluated the patient during the encounter.  None 84 year old female here with complaint of severe left shoulder pain after reaching for an object.  Has evidence of anterior shoulder dislocation.  Will sedate and relocate   Lacretia Leigh, MD 08/23/18 1539

## 2018-08-23 NOTE — ED Provider Notes (Signed)
    ED Course/Procedures     .Sedation Date/Time: 08/23/2018 4:23 PM Performed by: Lacretia Leigh, MD Authorized by: Lacretia Leigh, MD   Consent:    Consent obtained:  Written   Consent given by:  Patient   Alternatives discussed:  Analgesia without sedation Universal protocol:    Immediately prior to procedure a time out was called: yes   Pre-sedation assessment:    Time since last food or drink:  1200pm   ASA classification: class 2 - patient with mild systemic disease     Neck mobility: normal     Mouth opening:  3 or more finger widths   Mallampati score:  I - soft palate, uvula, fauces, pillars visible   Pre-sedation assessments completed and reviewed: airway patency     Pre-sedation assessment completed:  08/23/2018 4:00 PM Immediate pre-procedure details:    Reassessment: Patient reassessed immediately prior to procedure     Reviewed: vital signs     Verified: bag valve mask available and intubation equipment available   Procedure details (see MAR for exact dosages):    Preoxygenation:  Room air   Sedation:  Midazolam   Analgesia:  Fentanyl   Intra-procedure monitoring:  Blood pressure monitoring and cardiac monitor   Intra-procedure events: none     Intra-procedure management:  Airway repositioning   Total Provider sedation time (minutes):  25 Post-procedure details:    Post-sedation assessment completed:  08/23/2018 4:25 PM   Attendance: Constant attendance by certified staff until patient recovered     Recovery: Patient returned to pre-procedure baseline     Post-sedation assessments completed and reviewed: airway patency     Patient is stable for discharge or admission: yes     Patient tolerance:  Tolerated well, no immediate complications Reduction of dislocation Date/Time: 08/23/2018 4:25 PM Performed by: Lacretia Leigh, MD Authorized by: Lacretia Leigh, MD  Consent: Written consent obtained. Consent given by: patient Patient identity confirmed: verbally  with patient Patient tolerance: Patient tolerated the procedure well with no immediate complications     MDM  Medical screening examination/treatment/procedure(s) were conducted as a shared visit with non-physician practitioner(s) and myself.  I personally evaluated the patient during the encounter.  None Patient here complaining of left shoulder pain after reaching for an object.  Has evidence of left anterior shoulder dislocation.  Patient relocated using traction countertraction.  Will allow patient to recover and then discharge         Lacretia Leigh, MD 08/23/18 1626

## 2018-08-23 NOTE — Discharge Instructions (Addendum)
Please call the cardiologist on Monday to schedule an appointment given your atrial fibrillation. I have listed Dr. Mila Palmer office phone number below.   Please also schedule a follow up appointment with your orthopedic doctor.   Use the sling for support. You can take this off for showering.   Come back to the ER for any new or worsening symptoms like shortness of breath, chest pain, feeling as if you are going to pass out.

## 2018-08-26 DIAGNOSIS — M8088XD Other osteoporosis with current pathological fracture, vertebra(e), subsequent encounter for fracture with routine healing: Secondary | ICD-10-CM | POA: Diagnosis not present

## 2018-08-26 DIAGNOSIS — M150 Primary generalized (osteo)arthritis: Secondary | ICD-10-CM | POA: Diagnosis not present

## 2018-08-26 DIAGNOSIS — M0609 Rheumatoid arthritis without rheumatoid factor, multiple sites: Secondary | ICD-10-CM | POA: Diagnosis not present

## 2018-08-26 DIAGNOSIS — Z79899 Other long term (current) drug therapy: Secondary | ICD-10-CM | POA: Diagnosis not present

## 2018-08-26 DIAGNOSIS — M545 Low back pain: Secondary | ICD-10-CM | POA: Diagnosis not present

## 2018-08-26 DIAGNOSIS — M14671 Charcot's joint, right ankle and foot: Secondary | ICD-10-CM | POA: Diagnosis not present

## 2018-08-27 ENCOUNTER — Ambulatory Visit (INDEPENDENT_AMBULATORY_CARE_PROVIDER_SITE_OTHER): Payer: PPO | Admitting: Orthopedic Surgery

## 2018-09-02 ENCOUNTER — Encounter (INDEPENDENT_AMBULATORY_CARE_PROVIDER_SITE_OTHER): Payer: Self-pay | Admitting: Family Medicine

## 2018-09-02 ENCOUNTER — Ambulatory Visit (INDEPENDENT_AMBULATORY_CARE_PROVIDER_SITE_OTHER): Payer: PPO | Admitting: Family Medicine

## 2018-09-02 DIAGNOSIS — G8929 Other chronic pain: Secondary | ICD-10-CM | POA: Diagnosis not present

## 2018-09-02 DIAGNOSIS — M25511 Pain in right shoulder: Secondary | ICD-10-CM

## 2018-09-02 NOTE — Progress Notes (Signed)
Office Visit Note   Patient: Dominique Mckee           Date of Birth: February 13, 1930           MRN: [id number] Visit Date: 09/02/2018 Requested by: Wenda Low, MD [address] [place], [place] [address] PCP: Wenda Low, MD  Subjective: Chief Complaint  Patient presents with  . Left Shoulder - Follow-up, Pain    S/p dislocation 08/23/18 -reached for the bar to help her stand - reduced at  [facility] ED - in sling  . Right Shoulder - Pain    Right shoulder hurts worse than the left.    HPI: She is here with right shoulder pain.  Recent closed reduction of left shoulder dislocation at the ER.  She was simply reaching forward with her left arm.  Her right shoulder has been hurting more lately, she thinks from having to use it more than usual.  Last year she was given an injection which did not help.  Voltaren gel helps, but she has difficulty applying it.  She is meeting with her cardiologist next week.  She is very discouraged by her ongoing shoulder pain and quality of life is not as good as it used to be.  She is interested in surgical options if it is safe enough from a cardiology standpoint.              ROS: Otherwise noncontributory  Objective: Vital Signs: There were no vitals taken for this visit.  Physical Exam:  Right shoulder: Limited active range of motion.  Tender along the anterior shoulder, near the biceps tendon.  Imaging: None today.  Assessment & Plan: 1.  Chronic right shoulder pain with osteoarthritis and rotator cuff deficiency -Surgery would be very risky for her.  Increase Voltaren gel to 3 times daily as needed.  Start physical therapy.  Follow-up as needed.   Follow-Up Instructions: No follow-ups on file.       Procedures: None today.   PMFS History: Patient Active Problem List   Diagnosis Date Noted  . Status post lumbar spinal fusion 04/13/2017  . Degenerative lumbar spinal stenosis 03/15/2017  . CKD (chronic kidney disease)  01/11/2017  . Anemia 01/10/2017  . Chronic pain 01/10/2017  . Heart murmur 01/10/2017  . Acute diastolic congestive heart failure ([place]) 07/08/2015  . Acute respiratory failure with hypoxia ([place]) 07/08/2015  . Shoulder dislocation, recurrent 07/06/2015  . HTN (hypertension) 07/06/2015  . Rheumatoid arthritis ([place]) 07/06/2015  . GERD (gastroesophageal reflux disease) 07/06/2015  . Shoulder dislocation   . Concussion with no loss of consciousness 12/30/2013  . Cervical spondylosis without myelopathy 01/01/2013  . Migraine without aura 01/01/2013  . Abnormality of gait 01/01/2013  . Essential and other specified forms of tremor 01/01/2013   Past Medical History:  Diagnosis Date  . Arthritis   . Benign essential tremor   . Cervical spondylosis   . Cervicogenic headache   . CHF (congestive heart failure) ([place])   . Chronic renal insufficiency   . Degenerative disc disease   . Depression   . Fibromyalgia   . Gait disorder   . GERD (gastroesophageal reflux disease)   . Heart murmur   . HOH (hard of hearing)   . Hypertension   . Lumbar spondylosis   . Migraine headache   . Mild obesity   . Osteopenia   . Rheumatoid arthritis ([place])     Family History  Problem Relation Age of Onset  .  Osteoporosis Mother   . Intracerebral hemorrhage Mother 60  . AAA (abdominal aortic aneurysm) Father 72  . Lung cancer Brother   . Diabetes Brother   . Lung cancer Brother   . Diabetes Sister   . Lung cancer Sister   . Bone cancer Sister   . Kidney failure Son        left kidney transplant  . Diabetes type I Daughter   . Anesthesia problems Neg Hx   . Hypertension Neg Hx     Past Surgical History:  Procedure Laterality Date  . ABDOMINAL HYSTERECTOMY    . APPENDECTOMY    . CHOLECYSTECTOMY    . COLONOSCOPY    . ELBOW SURGERY Left   . HAMMER TOE SURGERY Left   . LUMBAR FUSION  02/2017  . SHOULDER CLOSED REDUCTION Right 07/08/2015   Procedure: RIGHT SHOULDER CLOSED REDUCTION;  Surgeon:  Meredith Pel, MD;  Location: [facility];  Service: Orthopedics;  Laterality: Right;  . SHOULDER CLOSED REDUCTION Left 08/23/2017   Procedure: CLOSED REDUCTION LEFT SHOULDER;  Surgeon: Meredith Pel, MD;  Location: [facility];  Service: Orthopedics;  Laterality: Left;  . TONSILLECTOMY    . TOTAL KNEE ARTHROPLASTY Right    right   Social History   Occupational History  . Occupation: Retired    Fish farm manager: RETIRED  Tobacco Use  . Smoking status: Never Smoker  . Smokeless tobacco: Never Used  Substance and Sexual Activity  . Alcohol use: No  . Drug use: No  . Sexual activity: Not Currently

## 2018-09-04 ENCOUNTER — Other Ambulatory Visit: Payer: Self-pay | Admitting: Cardiology

## 2018-09-04 DIAGNOSIS — Z79899 Other long term (current) drug therapy: Secondary | ICD-10-CM | POA: Diagnosis not present

## 2018-09-04 DIAGNOSIS — M545 Low back pain: Secondary | ICD-10-CM | POA: Diagnosis not present

## 2018-09-04 DIAGNOSIS — Z0189 Encounter for other specified special examinations: Secondary | ICD-10-CM | POA: Diagnosis not present

## 2018-09-04 DIAGNOSIS — Z6835 Body mass index (BMI) 35.0-35.9, adult: Secondary | ICD-10-CM | POA: Diagnosis not present

## 2018-09-04 DIAGNOSIS — G894 Chronic pain syndrome: Secondary | ICD-10-CM | POA: Diagnosis not present

## 2018-09-04 DIAGNOSIS — I340 Nonrheumatic mitral (valve) insufficiency: Secondary | ICD-10-CM

## 2018-09-04 DIAGNOSIS — Z79891 Long term (current) use of opiate analgesic: Secondary | ICD-10-CM | POA: Diagnosis not present

## 2018-09-04 DIAGNOSIS — I480 Paroxysmal atrial fibrillation: Secondary | ICD-10-CM | POA: Diagnosis not present

## 2018-09-16 ENCOUNTER — Ambulatory Visit (HOSPITAL_COMMUNITY)
Admission: RE | Admit: 2018-09-16 | Discharge: 2018-09-16 | Disposition: A | Discharge status: Home / Self Care | Payer: PPO | Source: Ambulatory Visit | Attending: Internal Medicine | Admitting: Internal Medicine

## 2018-09-16 DIAGNOSIS — I340 Nonrheumatic mitral (valve) insufficiency: Secondary | ICD-10-CM

## 2018-09-16 DIAGNOSIS — I083 Combined rheumatic disorders of mitral, aortic and tricuspid valves: Secondary | ICD-10-CM | POA: Insufficient documentation

## 2018-09-16 DIAGNOSIS — I509 Heart failure, unspecified: Secondary | ICD-10-CM | POA: Diagnosis not present

## 2018-09-16 DIAGNOSIS — I130 Hypertensive heart and chronic kidney disease with heart failure and stage 1 through stage 4 chronic kidney disease, or unspecified chronic kidney disease: Secondary | ICD-10-CM | POA: Insufficient documentation

## 2018-09-16 DIAGNOSIS — N189 Chronic kidney disease, unspecified: Secondary | ICD-10-CM | POA: Insufficient documentation

## 2018-09-16 NOTE — Progress Notes (Signed)
  Echocardiogram 2D Echocardiogram has been performed.  Dominique Mckee 09/16/2018, 1:28 PM

## 2018-09-18 ENCOUNTER — Ambulatory Visit: Payer: PPO | Admitting: Podiatry

## 2018-09-23 DIAGNOSIS — I480 Paroxysmal atrial fibrillation: Secondary | ICD-10-CM | POA: Diagnosis not present

## 2018-10-01 ENCOUNTER — Ambulatory Visit: Payer: PPO | Admitting: Podiatry

## 2018-10-20 ENCOUNTER — Encounter (HOSPITAL_COMMUNITY): Payer: Self-pay | Admitting: Emergency Medicine

## 2018-10-20 ENCOUNTER — Emergency Department (HOSPITAL_COMMUNITY): Payer: PPO

## 2018-10-20 ENCOUNTER — Observation Stay (HOSPITAL_COMMUNITY)
Admission: EM | Admit: 2018-10-20 | Discharge: 2018-10-27 | Disposition: A | Discharge status: Skilled Nursing Facility | Payer: PPO | Attending: Family Medicine | Admitting: Family Medicine

## 2018-10-20 ENCOUNTER — Other Ambulatory Visit: Payer: Self-pay

## 2018-10-20 DIAGNOSIS — I5032 Chronic diastolic (congestive) heart failure: Secondary | ICD-10-CM | POA: Diagnosis present

## 2018-10-20 DIAGNOSIS — R002 Palpitations: Secondary | ICD-10-CM | POA: Diagnosis not present

## 2018-10-20 DIAGNOSIS — R42 Dizziness and giddiness: Secondary | ICD-10-CM | POA: Diagnosis not present

## 2018-10-20 DIAGNOSIS — K449 Diaphragmatic hernia without obstruction or gangrene: Secondary | ICD-10-CM | POA: Diagnosis not present

## 2018-10-20 DIAGNOSIS — R112 Nausea with vomiting, unspecified: Secondary | ICD-10-CM | POA: Diagnosis not present

## 2018-10-20 DIAGNOSIS — I130 Hypertensive heart and chronic kidney disease with heart failure and stage 1 through stage 4 chronic kidney disease, or unspecified chronic kidney disease: Secondary | ICD-10-CM | POA: Diagnosis not present

## 2018-10-20 DIAGNOSIS — Z79891 Long term (current) use of opiate analgesic: Secondary | ICD-10-CM | POA: Diagnosis not present

## 2018-10-20 DIAGNOSIS — M542 Cervicalgia: Secondary | ICD-10-CM

## 2018-10-20 DIAGNOSIS — M069 Rheumatoid arthritis, unspecified: Secondary | ICD-10-CM | POA: Diagnosis not present

## 2018-10-20 DIAGNOSIS — M797 Fibromyalgia: Secondary | ICD-10-CM | POA: Diagnosis not present

## 2018-10-20 DIAGNOSIS — N189 Chronic kidney disease, unspecified: Secondary | ICD-10-CM | POA: Insufficient documentation

## 2018-10-20 DIAGNOSIS — Z79899 Other long term (current) drug therapy: Secondary | ICD-10-CM | POA: Insufficient documentation

## 2018-10-20 DIAGNOSIS — R531 Weakness: Secondary | ICD-10-CM

## 2018-10-20 DIAGNOSIS — R4182 Altered mental status, unspecified: Secondary | ICD-10-CM | POA: Diagnosis not present

## 2018-10-20 DIAGNOSIS — R0902 Hypoxemia: Principal | ICD-10-CM | POA: Diagnosis present

## 2018-10-20 DIAGNOSIS — R1111 Vomiting without nausea: Secondary | ICD-10-CM | POA: Diagnosis not present

## 2018-10-20 DIAGNOSIS — I4510 Unspecified right bundle-branch block: Secondary | ICD-10-CM | POA: Diagnosis not present

## 2018-10-20 DIAGNOSIS — R51 Headache: Secondary | ICD-10-CM | POA: Diagnosis not present

## 2018-10-20 DIAGNOSIS — R29898 Other symptoms and signs involving the musculoskeletal system: Secondary | ICD-10-CM | POA: Diagnosis not present

## 2018-10-20 DIAGNOSIS — Z515 Encounter for palliative care: Secondary | ICD-10-CM

## 2018-10-20 DIAGNOSIS — Z66 Do not resuscitate: Secondary | ICD-10-CM | POA: Diagnosis not present

## 2018-10-20 DIAGNOSIS — R5381 Other malaise: Secondary | ICD-10-CM | POA: Diagnosis not present

## 2018-10-20 LAB — CBC WITH DIFFERENTIAL/PLATELET
Abs Immature Granulocytes: 0.03 10*3/uL (ref 0.00–0.07)
Basophils Absolute: 0 10*3/uL (ref 0.0–0.1)
Basophils Relative: 0 %
EOS ABS: 0 10*3/uL (ref 0.0–0.5)
Eosinophils Relative: 0 %
HCT: 42.8 % (ref 36.0–46.0)
Hemoglobin: 13.1 g/dL (ref 12.0–15.0)
Immature Granulocytes: 0 %
Lymphocytes Relative: 15 %
Lymphs Abs: 1.2 10*3/uL (ref 0.7–4.0)
MCH: 31.4 pg (ref 26.0–34.0)
MCHC: 30.6 g/dL (ref 30.0–36.0)
MCV: 102.6 fL — ABNORMAL HIGH (ref 80.0–100.0)
Monocytes Absolute: 0.2 10*3/uL (ref 0.1–1.0)
Monocytes Relative: 2 %
NRBC: 0 % (ref 0.0–0.2)
Neutro Abs: 6.8 10*3/uL (ref 1.7–7.7)
Neutrophils Relative %: 83 %
Platelets: 220 10*3/uL (ref 150–400)
RBC: 4.17 MIL/uL (ref 3.87–5.11)
RDW: 13.4 % (ref 11.5–15.5)
WBC: 8.2 10*3/uL (ref 4.0–10.5)

## 2018-10-20 LAB — COMPREHENSIVE METABOLIC PANEL
ALBUMIN: 3.6 g/dL (ref 3.5–5.0)
ALT: 13 U/L (ref 0–44)
AST: 31 U/L (ref 15–41)
Alkaline Phosphatase: 59 U/L (ref 38–126)
Anion gap: 15 (ref 5–15)
BILIRUBIN TOTAL: 1.9 mg/dL — AB (ref 0.3–1.2)
BUN: 19 mg/dL (ref 8–23)
CHLORIDE: 102 mmol/L (ref 98–111)
CO2: 25 mmol/L (ref 22–32)
Calcium: 8.9 mg/dL (ref 8.9–10.3)
Creatinine, Ser: 0.79 mg/dL (ref 0.44–1.00)
GFR calc Af Amer: >60 mL/min (ref >60)
GFR calc non Af Amer: >60 mL/min (ref >60)
Glucose, Bld: 142 mg/dL — ABNORMAL HIGH (ref 70–99)
POTASSIUM: 3.6 mmol/L (ref 3.5–5.1)
Sodium: 142 mmol/L (ref 135–145)
TOTAL PROTEIN: 7.5 g/dL (ref 6.5–8.1)

## 2018-10-20 LAB — MAGNESIUM: Magnesium: 1.9 mg/dL (ref 1.7–2.4)

## 2018-10-20 LAB — LIPASE, BLOOD: LIPASE: 25 U/L (ref 11–51)

## 2018-10-20 LAB — I-STAT TROPONIN, ED: Troponin i, poc: 0 ng/mL (ref 0.00–0.08)

## 2018-10-20 LAB — TSH: TSH: 0.781 u[IU]/mL (ref 0.350–4.500)

## 2018-10-20 MED ORDER — SODIUM CHLORIDE 0.9 % IV BOLUS
500.0000 mL | Freq: Once | INTRAVENOUS | Status: AC
  Administered 2018-10-20: 500 mL via INTRAVENOUS

## 2018-10-20 MED ORDER — IOPAMIDOL (ISOVUE-370) INJECTION 76%
INTRAVENOUS | Status: AC
  Administered 2018-10-20: 50 mL
  Filled 2018-10-20: qty 50

## 2018-10-20 MED ORDER — PROCHLORPERAZINE EDISYLATE 10 MG/2ML IJ SOLN
5.0000 mg | Freq: Once | INTRAMUSCULAR | Status: AC
  Administered 2018-10-20: 5 mg via INTRAVENOUS
  Filled 2018-10-20: qty 2

## 2018-10-20 MED ORDER — ONDANSETRON HCL 4 MG/2ML IJ SOLN
4.0000 mg | Freq: Once | INTRAMUSCULAR | Status: AC | PRN
  Administered 2018-10-20: 4 mg via INTRAVENOUS
  Filled 2018-10-20: qty 2

## 2018-10-20 MED ORDER — DIPHENHYDRAMINE HCL 50 MG/ML IJ SOLN
25.0000 mg | Freq: Once | INTRAMUSCULAR | Status: AC
  Administered 2018-10-20: 25 mg via INTRAVENOUS
  Filled 2018-10-20: qty 1

## 2018-10-20 NOTE — ED Triage Notes (Signed)
Pt has been out of assisted living facility x 1  Month and living at her house, pt went with son to cardiologist this morning and called EMS for general weakness and dizziness that began when they arrived.    Pt vomiting upon arrival to ED.

## 2018-10-20 NOTE — ED Notes (Signed)
Pt desat to 83-85% on RA.

## 2018-10-20 NOTE — ED Provider Notes (Signed)
[facility] DEPART[facility] Provider Note   CSN: [id number] Arrival date & time: 10/20/18  1359     History   Chief Complaint Chief Complaint  Patient presents with  . Nausea  . Emesis    HPI Dominique Mckee is a 84 y.o. female.  The history is provided by the patient and medical records. No language interpreter was used.  Headache   This is a new problem. The current episode started more than 2 days ago. The problem occurs constantly. The problem has not changed since onset.The headache is associated with nothing. The pain is located in the occipital and right unilateral region. The quality of the pain is described as dull. The pain is at a severity of 8/10. The pain is severe. The pain radiates to the right neck. Associated symptoms include malaise/fatigue, nausea and vomiting. Pertinent negatives include no fever, no chest pressure, no near-syncope, no palpitations, no syncope and no shortness of breath. She has tried nothing for the symptoms. The treatment provided no relief.    Past Medical History:  Diagnosis Date  . Arthritis   . Benign essential tremor   . Cervical spondylosis   . Cervicogenic headache   . CHF (congestive heart failure) ([place])   . Chronic renal insufficiency   . Degenerative disc disease   . Depression   . Fibromyalgia   . Gait disorder   . GERD (gastroesophageal reflux disease)   . Heart murmur   . HOH (hard of hearing)   . Hypertension   . Lumbar spondylosis   . Migraine headache   . Mild obesity   . Osteopenia   . Rheumatoid arthritis [facility])     Patient Active Problem List   Diagnosis Date Noted  . Status post lumbar spinal fusion 04/13/2017  . Degenerative lumbar spinal stenosis 03/15/2017  . CKD (chronic kidney disease) 01/11/2017  . Anemia 01/10/2017  . Chronic pain 01/10/2017  . Heart murmur 01/10/2017  . Acute diastolic congestive heart failure ([place]) 07/08/2015  . Acute respiratory failure with hypoxia  ([place]) 07/08/2015  . Shoulder dislocation, recurrent 07/06/2015  . HTN (hypertension) 07/06/2015  . Rheumatoid arthritis ([place]) 07/06/2015  . GERD (gastroesophageal reflux disease) 07/06/2015  . Shoulder dislocation   . Concussion with no loss of consciousness 12/30/2013  . Cervical spondylosis without myelopathy 01/01/2013  . Migraine without aura 01/01/2013  . Abnormality of gait 01/01/2013  . Essential and other specified forms of tremor 01/01/2013    Past Surgical History:  Procedure Laterality Date  . ABDOMINAL HYSTERECTOMY    . APPENDECTOMY    . CHOLECYSTECTOMY    . COLONOSCOPY    . ELBOW SURGERY Left   . HAMMER TOE SURGERY Left   . LUMBAR FUSION  02/2017  . SHOULDER CLOSED REDUCTION Right 07/08/2015   Procedure: RIGHT SHOULDER CLOSED REDUCTION;  Surgeon: Meredith Pel, MD;  Location: [facility];  Service: Orthopedics;  Laterality: Right;  . SHOULDER CLOSED REDUCTION Left 08/23/2017   Procedure: CLOSED REDUCTION LEFT SHOULDER;  Surgeon: Meredith Pel, MD;  Location: [facility];  Service: Orthopedics;  Laterality: Left;  . TONSILLECTOMY    . TOTAL KNEE ARTHROPLASTY Right    right     OB History    Gravida      Para      Term      Preterm      AB      Living  1     SAB  TAB      Ectopic      Multiple      Live Births               Home Medications    Prior to Admission medications   Medication Sig Start Date End Date Taking? Authorizing Provider  acetaminophen (TYLENOL) 500 MG tablet Take 500 mg by mouth every 6 (six) hours as needed for mild pain.    [provider]  Calcium Carb-Cholecalciferol (CALCIUM 600+D3) 600-200 MG-UNIT TABS Take 1 tablet by mouth daily.    [provider]  calcium carbonate (TUMS - DOSED IN MG ELEMENTAL CALCIUM) 500 MG chewable tablet Chew 1 tablet by mouth 2 (two) times daily.    [provider]  diclofenac sodium (VOLTAREN) 1 % GEL Apply 1 application topically 2 (two) times daily as  needed (pain).     [provider]  DULoxetine (CYMBALTA) 30 MG capsule Take 30 mg by mouth daily. 10/05/17   [provider]  feeding supplement, ENSURE ENLIVE, (ENSURE ENLIVE) LIQD Take 237 mLs by mouth 3 (three) times daily between meals. 08/26/17   Meredith Pel, MD  ferrous sulfate 325 (65 FE) MG tablet Take 325 mg by mouth every evening.    [provider]  folic acid (FOLVITE) 1 MG tablet Take 1 mg by mouth daily.  12/29/13   [provider]  furosemide (LASIX) 20 MG tablet Take 20 mg by mouth daily.    [provider]  gabapentin (NEURONTIN) 300 MG capsule Take 900 mg by mouth at bedtime. 09/11/17   [provider]  Melatonin 3 MG CAPS Take 3 mg by mouth at bedtime.    [provider]  methotrexate (RHEUMATREX) 2.5 MG tablet Take 2.5 mg by mouth once a week. Caution:Chemotherapy. Protect from light.    [provider]  oxyCODONE (ROXICODONE) 5 MG immediate release tablet Take 5 mg by mouth every 6 (six) hours as needed for severe pain.    [provider]  pantoprazole (PROTONIX) 40 MG tablet Take 40 mg by mouth daily.    [provider]  polyethylene glycol powder (MIRALAX) powder Take 17 g by mouth daily. 07/10/15   Janece Canterbury, MD  propranolol (INDERAL) 20 MG tablet Take 1 tablet (20 mg total) by mouth 2 (two) times daily. 07/10/15   Janece Canterbury, MD  senna (SENOKOT) 8.6 MG TABS tablet Take 2 tablets (17.2 mg total) by mouth at bedtime. 07/10/15   Janece Canterbury, MD    Family History Family History  Problem Relation Age of Onset  . Osteoporosis Mother   . Intracerebral hemorrhage Mother 25  . AAA (abdominal aortic aneurysm) Father 73  . Lung cancer Brother   . Diabetes Brother   . Lung cancer Brother   . Diabetes Sister   . Lung cancer Sister   . Bone cancer Sister   . Kidney failure Son        left kidney transplant  . Diabetes type I Daughter   . Anesthesia problems Neg Hx     . Hypertension Neg Hx     Social History Social History   Tobacco Use  . Smoking status: Never Smoker  . Smokeless tobacco: Never Used  Substance Use Topics  . Alcohol use: No  . Drug use: No     Allergies   Feldene [piroxicam]; Clinoril [sulindac]; and Morphine and related   Review of Systems Review of Systems  Constitutional: Positive for chills, fatigue and  malaise/fatigue. Negative for diaphoresis and fever.  HENT: Negative for congestion.   Eyes: Negative for visual disturbance.  Respiratory: Negative for cough, chest tightness, shortness of breath and wheezing.   Cardiovascular: Negative for chest pain, palpitations, leg swelling, syncope and near-syncope.  Gastrointestinal: Positive for nausea and vomiting. Negative for abdominal pain, constipation and diarrhea.  Genitourinary: Negative for dysuria, flank pain and frequency.  Musculoskeletal: Positive for neck pain and neck stiffness. Negative for back pain.  Skin: Negative for rash and wound.  Neurological: Positive for tremors, weakness (L lower leg for several months), light-headedness (when turning head side to side) and headaches. Negative for dizziness, seizures, syncope and speech difficulty.  Psychiatric/Behavioral: Negative for agitation.  All other systems reviewed and are negative.    Physical Exam Updated Vital Signs BP (!) 197/108 (BP Location: Right Arm)   Pulse 65   Temp 97.9 F (36.6 C) (Oral)   Resp 14   Ht 5\' 3"  (1.6 m)   Wt 77.1 kg   SpO2 97%   BMI 30.11 kg/m   Physical Exam Vitals signs and nursing note reviewed.  Constitutional:      General: She is not in acute distress.    Appearance: She is well-developed. She is not ill-appearing, toxic-appearing or diaphoretic.  HENT:     Head: Normocephalic and atraumatic.     Right Ear: External ear normal.     Left Ear: External ear normal.     Nose: Nose normal. No congestion or rhinorrhea.     Mouth/Throat:     Pharynx: No  oropharyngeal exudate or posterior oropharyngeal erythema.  Eyes:     Conjunctiva/sclera: Conjunctivae normal.     Pupils: Pupils are equal, round, and reactive to light.  Neck:     Musculoskeletal: Normal range of motion. Muscular tenderness present. No neck rigidity.     Comments: Patient reported she got lightheaded when she twisted her neck side to side.  No dizziness or room spinning.  She was able to bend her neck forward and this did not make her have the same lightheadedness or change in symptoms. Cardiovascular:     Rate and Rhythm: Normal rate and regular rhythm.  Extrasystoles are present.    Pulses: Normal pulses.     Heart sounds: Murmur present.     Comments: Occasional PVC with trigeminy  Pulmonary:     Effort: No respiratory distress.     Breath sounds: No stridor. No wheezing, rhonchi or rales.  Chest:     Chest wall: No tenderness.  Abdominal:     General: There is no distension.     Tenderness: There is no abdominal tenderness. There is no rebound.  Musculoskeletal:        General: Tenderness (bruising on legs) present.     Right lower leg: No edema.     Left lower leg: No edema.  Skin:    General: Skin is warm.     Capillary Refill: Capillary refill takes less than 2 seconds.     Findings: No erythema or rash.  Neurological:     Mental Status: She is alert and oriented to person, place, and time.     Cranial Nerves: No cranial nerve deficit.     Sensory: No sensory deficit.     Motor: Weakness present. No abnormal muscle tone.     Coordination: Coordination normal.     Deep Tendon Reflexes: Reflexes are normal and symmetric.     Comments: Left lower leg weakness with leg  raise compared to right.  Patient reports this is unchanged for several months.  Normal sensation throughout.  Normal pulses.  Symmetric grip strength, sensation and coordination with finger-nose-finger testing.  Patient does have bilateral tremors in her arms.  Psychiatric:        Mood and  Affect: Mood normal.      ED Treatments / Results  Labs (all labs ordered are listed, but only abnormal results are displayed) Labs Reviewed  COMPREHENSIVE METABOLIC PANEL - Abnormal; Notable for the following components:      Result Value   Glucose, Bld 142 (*)    Total Bilirubin 1.9 (*)    All other components within normal limits  CBC WITH DIFFERENTIAL/PLATELET - Abnormal; Notable for the following components:   MCV 102.6 (*)    All other components within normal limits  URINE CULTURE  LIPASE, BLOOD  MAGNESIUM  TSH  CBC WITH DIFFERENTIAL/PLATELET  URINALYSIS, ROUTINE W REFLEX MICROSCOPIC  I-STAT TROPONIN, ED    EKG EKG Interpretation  Date/Time:  Monday October 20 2018 15:39:07 EST Ventricular Rate:  63 PR Interval:    QRS Duration: 158 QT Interval:  476 QTC Calculation: 488 R Axis:   92 Text Interpretation:  Sinus rhythm Right bundle branch block When compared to prior, similar RBBB. Question a flutter vs mild tremor vs U wave causing signal abnormality.  No STEMI Confirmed by Antony Blackbird [phone]) on 10/20/2018 3:55:21 PM   Radiology Ct Angio Head W Or Wo Contrast  Result Date: 10/20/2018 CLINICAL DATA:  Altered mental status. Weakness and dizziness. Headache with nausea and vomiting. EXAM: CT ANGIOGRAPHY HEAD AND NECK TECHNIQUE: Multidetector CT imaging of the head and neck was performed using the standard protocol during bolus administration of intravenous contrast. Multiplanar CT image reconstructions and MIPs were obtained to evaluate the vascular anatomy. Carotid stenosis measurements (when applicable) are obtained utilizing NASCET criteria, using the distal internal carotid diameter as the denominator. CONTRAST:  75mL ISOVUE-370 IOPAMIDOL (ISOVUE-370) INJECTION 76% COMPARISON:  08/19/2017 FINDINGS: CT HEAD FINDINGS Brain: There is no mass, hemorrhage or extra-axial collection. There is generalized atrophy without lobar predilection. There is hypoattenuation of  the periventricular white matter, most commonly indicating chronic ischemic microangiopathy. Skull: The visualized skull base, calvarium and extracranial soft tissues are normal. Sinuses/Orbits: No fluid levels or advanced mucosal thickening of the visualized paranasal sinuses. No mastoid or middle ear effusion. The orbits are normal. CTA NECK FINDINGS SKELETON: There is no bony spinal canal stenosis. No lytic or blastic lesion. OTHER NECK: Normal pharynx, larynx and major salivary glands. No cervical lymphadenopathy. Unremarkable thyroid gland. UPPER CHEST: No pneumothorax or pleural effusion. No nodules or masses. AORTIC ARCH: There is mild calcific atherosclerosis of the aortic arch. There is no aneurysm, dissection or hemodynamically significant stenosis of the visualized ascending aorta and aortic arch. Conventional 3 vessel aortic branching pattern. The visualized proximal subclavian arteries are widely patent. RIGHT CAROTID SYSTEM: --Common carotid artery: Widely patent origin without common carotid artery dissection or aneurysm. --Internal carotid artery: No dissection, occlusion or aneurysm. Mild atherosclerotic calcification at the carotid bifurcation without hemodynamically significant stenosis. --External carotid artery: No acute abnormality. LEFT CAROTID SYSTEM: --Common carotid artery: Widely patent origin without common carotid artery dissection or aneurysm. --Internal carotid artery: No dissection, occlusion or aneurysm. There is predominantly calcified atherosclerosis extending into the proximal ICA, resulting in less than 50% stenosis. --External carotid artery: No acute abnormality. VERTEBRAL ARTERIES: Codominant configuration. Both origins are normal. No dissection, occlusion or flow-limiting stenosis to the  vertebrobasilar confluence. CTA HEAD FINDINGS ANTERIOR CIRCULATION: --Intracranial internal carotid arteries: Atherosclerotic calcification of the internal carotid arteries at the skull base  without hemodynamically significant stenosis. --Anterior cerebral arteries: Normal. Both A1 segments are present. Patent anterior communicating artery. --Middle cerebral arteries: Normal. --Posterior communicating arteries: Present on the left, absent on the right. POSTERIOR CIRCULATION: --Basilar artery: Normal. --Posterior cerebral arteries: Normal right. Fetal origin of the left. Mild stenosis of the proximal left P2 segment. --Superior cerebellar arteries: Normal. --Inferior cerebellar arteries: Normal anterior and posterior inferior cerebellar arteries. VENOUS SINUSES: As permitted by contrast timing, patent. ANATOMIC VARIANTS: Fetal origin of the left posterior cerebral artery. This is a normal anatomic variant. DELAYED PHASE: No parenchymal contrast enhancement. Review of the MIP images confirms the above findings. IMPRESSION: 1. No emergent large vessel occlusion or high-grade stenosis. 2. Bilateral carotid bifurcation atherosclerosis without hemodynamically significant stenosis. 3. Mild atrophy and chronic microvascular disease. Aortic atherosclerosis (ICD10-I70.0). Electronically Signed   By: Ulyses Jarred M.D.   On: 10/20/2018 17:58   Dg Chest 2 View  Result Date: 10/20/2018 CLINICAL DATA:  Generalized weakness, hypoxia, chills EXAM: CHEST - 2 VIEW COMPARISON:  10/24/2017 FINDINGS: Cardiomegaly. Large hiatal hernia. No confluent airspace opacities or effusions. No acute bony abnormality. IMPRESSION: Cardiomegaly, hiatal hernia.  No active disease. Electronically Signed   By: Rolm Baptise M.D.   On: 10/20/2018 16:31   Ct Angio Neck W And/or Wo Contrast  Result Date: 10/20/2018 CLINICAL DATA:  Altered mental status. Weakness and dizziness. Headache with nausea and vomiting. EXAM: CT ANGIOGRAPHY HEAD AND NECK TECHNIQUE: Multidetector CT imaging of the head and neck was performed using the standard protocol during bolus administration of intravenous contrast. Multiplanar CT image reconstructions and  MIPs were obtained to evaluate the vascular anatomy. Carotid stenosis measurements (when applicable) are obtained utilizing NASCET criteria, using the distal internal carotid diameter as the denominator. CONTRAST:  59mL ISOVUE-370 IOPAMIDOL (ISOVUE-370) INJECTION 76% COMPARISON:  08/19/2017 FINDINGS: CT HEAD FINDINGS Brain: There is no mass, hemorrhage or extra-axial collection. There is generalized atrophy without lobar predilection. There is hypoattenuation of the periventricular white matter, most commonly indicating chronic ischemic microangiopathy. Skull: The visualized skull base, calvarium and extracranial soft tissues are normal. Sinuses/Orbits: No fluid levels or advanced mucosal thickening of the visualized paranasal sinuses. No mastoid or middle ear effusion. The orbits are normal. CTA NECK FINDINGS SKELETON: There is no bony spinal canal stenosis. No lytic or blastic lesion. OTHER NECK: Normal pharynx, larynx and major salivary glands. No cervical lymphadenopathy. Unremarkable thyroid gland. UPPER CHEST: No pneumothorax or pleural effusion. No nodules or masses. AORTIC ARCH: There is mild calcific atherosclerosis of the aortic arch. There is no aneurysm, dissection or hemodynamically significant stenosis of the visualized ascending aorta and aortic arch. Conventional 3 vessel aortic branching pattern. The visualized proximal subclavian arteries are widely patent. RIGHT CAROTID SYSTEM: --Common carotid artery: Widely patent origin without common carotid artery dissection or aneurysm. --Internal carotid artery: No dissection, occlusion or aneurysm. Mild atherosclerotic calcification at the carotid bifurcation without hemodynamically significant stenosis. --External carotid artery: No acute abnormality. LEFT CAROTID SYSTEM: --Common carotid artery: Widely patent origin without common carotid artery dissection or aneurysm. --Internal carotid artery: No dissection, occlusion or aneurysm. There is  predominantly calcified atherosclerosis extending into the proximal ICA, resulting in less than 50% stenosis. --External carotid artery: No acute abnormality. VERTEBRAL ARTERIES: Codominant configuration. Both origins are normal. No dissection, occlusion or flow-limiting stenosis to the vertebrobasilar confluence. CTA HEAD FINDINGS ANTERIOR CIRCULATION: --Intracranial internal  carotid arteries: Atherosclerotic calcification of the internal carotid arteries at the skull base without hemodynamically significant stenosis. --Anterior cerebral arteries: Normal. Both A1 segments are present. Patent anterior communicating artery. --Middle cerebral arteries: Normal. --Posterior communicating arteries: Present on the left, absent on the right. POSTERIOR CIRCULATION: --Basilar artery: Normal. --Posterior cerebral arteries: Normal right. Fetal origin of the left. Mild stenosis of the proximal left P2 segment. --Superior cerebellar arteries: Normal. --Inferior cerebellar arteries: Normal anterior and posterior inferior cerebellar arteries. VENOUS SINUSES: As permitted by contrast timing, patent. ANATOMIC VARIANTS: Fetal origin of the left posterior cerebral artery. This is a normal anatomic variant. DELAYED PHASE: No parenchymal contrast enhancement. Review of the MIP images confirms the above findings. IMPRESSION: 1. No emergent large vessel occlusion or high-grade stenosis. 2. Bilateral carotid bifurcation atherosclerosis without hemodynamically significant stenosis. 3. Mild atrophy and chronic microvascular disease. Aortic atherosclerosis (ICD10-I70.0). Electronically Signed   By: Ulyses Jarred M.D.   On: 10/20/2018 17:58   Ct C-spine No Charge  Result Date: 10/20/2018 CLINICAL DATA:  84 year old female with altered mental status, neck pain, generalized weakness, dizziness. EXAM: CT CERVICAL SPINE WITH CONTRAST TECHNIQUE: Multiplanar CT images of the cervical spine were reconstructed from contemporary CTA of the Neck.  CONTRAST:  No additional COMPARISON:  CTA head and neck today reported separately. Cervical spine CT 08/19/2017. FINDINGS: Alignment: Stable since 2018. Straightening and mild reversal of cervical lordosis with mild degenerative appearing anterolisthesis of C3 on C4 and C7 on T1. Bilateral posterior element alignment is within normal limits. Skull base and vertebrae: Visualized skull base is intact. No atlanto-occipital dissociation. No acute osseous abnormality identified in the cervical spine. Soft tissues and spinal canal: No prevertebral fluid or swelling. No visible canal hematoma. Arterial and other neck soft tissue findings are reported on the CTA today separately. Disc levels: Progressed since 2018 and severe right side C1-C2 joint space loss. Advanced disc and endplate degeneration except at C2-C3 and C7-T1. Advanced facet degeneration throughout the left cervical spine. Possible developing posterior element ankylosis on the left at C3-C4, while ankylosis has developed since 2018 on the left at C4-C5. Mild chronic spinal stenosis suspected C3-C4 through C5-C6. Upper chest: Visible upper thoracic levels appear grossly intact. Partially visible advanced right shoulder degeneration. Mild nonspecific upper lung mosaic attenuation, probably gas trapping. Other upper chest findings reported separately on the CTA today. Other: Severe TMJ degeneration greater on the right. IMPRESSION: 1. No acute osseous abnormality in the cervical spine. 2. Advanced cervical spine degeneration with progression since 2018. - severe right C1-C2 joint space loss. - degenerative ankylosis is developing at the left posterior elements of C3 through C5. - multilevel mild spinal stenosis. 3. CTA head and neck today are reported separately. Electronically Signed   By: Genevie Ann M.D.   On: 10/20/2018 17:57    Procedures Procedures (including critical care time)  Medications Ordered in ED Medications  ondansetron (ZOFRAN) injection 4  mg (4 mg Intravenous Given 10/20/18 1429)  iopamidol (ISOVUE-370) 76 % injection (50 mLs  Contrast Given 10/20/18 1645)  prochlorperazine (COMPAZINE) injection 5 mg (5 mg Intravenous Given 10/20/18 1910)  diphenhydrAMINE (BENADRYL) injection 25 mg (25 mg Intravenous Given 10/20/18 1912)  sodium chloride 0.9 % bolus 500 mL (0 mLs Intravenous Stopped 10/20/18 2030)     Initial Impression / Assessment and Plan / ED Course  I have reviewed the triage vital signs and the nursing notes.  Pertinent labs & imaging results that were available during my care of the patient were  reviewed by me and considered in my medical decision making (see chart for details).     Dominique Mckee is a 84 y.o. female with a past medical history significant for hypertension, migraines, essential tremor, prior myalgia, congestive heart failure, renal insufficiency, GERD, and rheumatoid arthritis who presents with fatigue and worsened headache/neck pain.  Patient reports that she is been feeling fatigued for the last several days.  She reports that over the last 4 days she has had pain in her right neck going into the right side of her head.  She says this is severe.  She reports that she has had nausea and vomiting today.  She denies any vision changes, numbness, tingling, or new weakness.  She does report that for several months she has had left leg weakness worse than the right.  She reports no recent neck manipulation, chiropractor use, or neck injury however her son does report that several days ago she fell asleep with her "neck bent to the right".  Patient describes her headache as an 8 out of 10 in severity starting from the neck and going into her head.  Patient reports no significant congestion or cough.  She denies any change in urination, constipation, or diarrhea.  She denies any chest pain or palpitations.  According to patient and family, patient was found to have intermittent atrial fibrillation and went to  follow-up with a cardiologist today.  Cardiology told the patient to come the emergency department due to the fatigue and headache symptoms.  She denies any history of dissection or vascular problem in her neck.  On exam, patient is able to bend her neck forward but has worsened lightheadedness and pain when she twists her neck side to side.  She has no trigger strength sensation and coordination in her upper extremities.  She does have a tremor in both arms.  Patient had left leg slightly weaker than right but had normal sensation.  Symmetric pulses.  Lungs were clear.  Chest had a systolic murmur.  Abdomen was nontender.  Back otherwise nontender.  Patient had tenderness on the right neck.  Pupils are reactive and symmetric.  Normal extraocular movements.  Clear speech, no facial droop.  Normal sensation of face.  Clinically I suspect the patient has evolution of her migraines however she reports that she has had neck pains in the past with rheumatoid arthritis.  Patient may have been her neck in a strange position several days ago while napping and caused this change in pain prompting the headache.  Patient will have imaging of her head and neck to rule out vascular abnormality especially with the symptoms worsening with neck twisting.  Low suspicion for meningitis based on exam and history.  Telemetry strip appeared to show A. fib or flutter.  Patient will have EKG and electrolytes and labs.  Due to the reported subjective chills, will get chest x-ray and urinalysis to look for sources of infection.  Anticipate reassessment after work-up.     7:36 PM Patient was reassessed after work-up.  CBC reassuring, TSH normal, magnesium normal.  CBC shows slight elevation in bilirubin but otherwise unremarkable.  CT of the head and neck and CTA showed no evidence of dissection or intracranial hemorrhage.  Chest x-ray showed cardiomegaly and hiatal hernia with no active disease.  Patient was informed of the  overall reassuring work-up.  Patient reports her headache is still mild to moderate.  She will be given a headache cocktail.  Patient was taken off  oxygen and unfortunately her oxygen saturations dropped into the 70s and 80s with a good waveform.  Unclear etiology of her hypoxia given a chest x-ray showing no pneumonia.  She has no chest pain or shortness breath at this time.   Given the new hypoxia, anticipate patient will require admission.  Last EF was 55 to 65% on November 19 of this year.  Low suspicion for CHF exacerbation causing her symptoms of hypoxia.  Patient just received the contrast for CTA head and neck.  We will likely discuss with admitting team about further imaging of her chest to rule out PE given the new hypoxia.  After headache cocktail, patient reports her headache is completely resolved.  Patient was reassessed and is still hypoxic requiring 3 L to maintain oxygen saturations.  Unclear etiology of hypoxia however I do not feel safe sending her home as she does not take oxygen normally.  Patient had another trial of room air and oxygen saturations dropped to 84%.  Patient also became more short of breath.  Patient will be admitted for further management of hypoxia.   Headache remains resolved.    Final Clinical Impressions(s) / ED Diagnoses   Final diagnoses:  Hypoxia    ED Discharge Orders    None     Clinical Impression: 1. Hypoxia   2. Neck pain     Disposition: Admit  This note was prepared with assistance of Dragon voice recognition software. Occasional wrong-word or sound-a-like substitutions may have occurred due to the inherent limitations of voice recognition software.     Johnatha Zeidman, Gwenyth Allegra, MD 10/21/18 [phone]

## 2018-10-20 NOTE — ED Notes (Signed)
MD Tegeler aware that pt is a very hard stick, Phlebotomist attempted to draw a TSH and was unsuccessful in getting enough.  Per MD Tegeler, TSH can be cancelled for now.

## 2018-10-20 NOTE — ED Notes (Signed)
Patient transported to X-ray 

## 2018-10-20 NOTE — ED Notes (Signed)
Mosetta Anis: [phone]

## 2018-10-20 NOTE — ED Notes (Signed)
Patient transported to CT 

## 2018-10-20 NOTE — ED Notes (Signed)
Attempted to take pt off O2 per MD request to see how the pts O2 remained. Pt remained off O2 and O2 sats dropped to 88% within a minute. Pt needs to remain on 3L in order to remain O2 sats at 92% or above.  Pt currently on 3L O2 with O2 sats remaining at 92%. MD notified

## 2018-10-20 NOTE — ED Notes (Signed)
Pt had incontinent episode in her brief. This tech and RN cleaned pt and removed old brief.

## 2018-10-20 NOTE — ED Notes (Signed)
Pt placed on 3 L [place] for sats in the mid 80's while sleeping. Will continue to monitor.

## 2018-10-21 ENCOUNTER — Observation Stay (HOSPITAL_BASED_OUTPATIENT_CLINIC_OR_DEPARTMENT_OTHER): Payer: PPO

## 2018-10-21 ENCOUNTER — Observation Stay (HOSPITAL_COMMUNITY): Payer: PPO

## 2018-10-21 DIAGNOSIS — I2699 Other pulmonary embolism without acute cor pulmonale: Secondary | ICD-10-CM | POA: Diagnosis not present

## 2018-10-21 DIAGNOSIS — R52 Pain, unspecified: Secondary | ICD-10-CM

## 2018-10-21 DIAGNOSIS — R0902 Hypoxemia: Secondary | ICD-10-CM | POA: Diagnosis present

## 2018-10-21 DIAGNOSIS — Z86718 Personal history of other venous thrombosis and embolism: Secondary | ICD-10-CM

## 2018-10-21 DIAGNOSIS — I340 Nonrheumatic mitral (valve) insufficiency: Secondary | ICD-10-CM

## 2018-10-21 DIAGNOSIS — R112 Nausea with vomiting, unspecified: Secondary | ICD-10-CM | POA: Diagnosis not present

## 2018-10-21 DIAGNOSIS — M542 Cervicalgia: Secondary | ICD-10-CM | POA: Diagnosis not present

## 2018-10-21 DIAGNOSIS — I5032 Chronic diastolic (congestive) heart failure: Secondary | ICD-10-CM | POA: Diagnosis present

## 2018-10-21 DIAGNOSIS — M069 Rheumatoid arthritis, unspecified: Secondary | ICD-10-CM | POA: Diagnosis not present

## 2018-10-21 LAB — URINALYSIS, ROUTINE W REFLEX MICROSCOPIC
Bilirubin Urine: NEGATIVE
GLUCOSE, UA: NEGATIVE mg/dL
HGB URINE DIPSTICK: NEGATIVE
KETONES UR: 5 mg/dL — AB
Leukocytes, UA: NEGATIVE
Nitrite: NEGATIVE
PROTEIN: NEGATIVE mg/dL
Specific Gravity, Urine: >1.046 — ABNORMAL HIGH (ref 1.005–1.030)
pH: 5 (ref 5.0–8.0)

## 2018-10-21 LAB — ECHOCARDIOGRAM COMPLETE
Height: 63 in
WEIGHTICAEL: 2720 [oz_av]

## 2018-10-21 LAB — INFLUENZA PANEL BY PCR (TYPE A & B)
Influenza A By PCR: NEGATIVE
Influenza B By PCR: NEGATIVE

## 2018-10-21 LAB — BRAIN NATRIURETIC PEPTIDE: B Natriuretic Peptide: 136.5 pg/mL — ABNORMAL HIGH (ref 0.0–100.0)

## 2018-10-21 MED ORDER — ACETAMINOPHEN 325 MG PO TABS
650.0000 mg | ORAL_TABLET | Freq: Four times a day (QID) | ORAL | Status: DC | PRN
  Administered 2018-10-23 – 2018-10-26 (×3): 650 mg via ORAL
  Filled 2018-10-21 (×4): qty 2

## 2018-10-21 MED ORDER — FOLIC ACID 1 MG PO TABS
1.0000 mg | ORAL_TABLET | Freq: Every day | ORAL | Status: DC
  Administered 2018-10-21 – 2018-10-27 (×7): 1 mg via ORAL
  Filled 2018-10-21 (×7): qty 1

## 2018-10-21 MED ORDER — CALCIUM CARBONATE ANTACID 500 MG PO CHEW
1.0000 | CHEWABLE_TABLET | Freq: Two times a day (BID) | ORAL | Status: DC
  Administered 2018-10-21 – 2018-10-27 (×12): 200 mg via ORAL
  Filled 2018-10-21 (×12): qty 1

## 2018-10-21 MED ORDER — ENOXAPARIN SODIUM 40 MG/0.4ML ~~LOC~~ SOLN: 40.0000 mg | Freq: Every day | SUBCUTANEOUS | Status: DC

## 2018-10-21 MED ORDER — APIXABAN 5 MG PO TABS
5.0000 mg | ORAL_TABLET | Freq: Two times a day (BID) | ORAL | Status: DC
  Administered 2018-10-21: 5 mg via ORAL

## 2018-10-21 MED ORDER — PROPRANOLOL HCL 20 MG PO TABS
20.0000 mg | ORAL_TABLET | Freq: Two times a day (BID) | ORAL | Status: DC
  Administered 2018-10-21 – 2018-10-27 (×10): 20 mg via ORAL
  Filled 2018-10-21 (×14): qty 1

## 2018-10-21 MED ORDER — ONDANSETRON HCL 4 MG PO TABS: 4.0000 mg | ORAL_TABLET | Freq: Four times a day (QID) | ORAL | Status: DC | PRN

## 2018-10-21 MED ORDER — DULOXETINE HCL 30 MG PO CPEP
30.0000 mg | ORAL_CAPSULE | Freq: Every day | ORAL | Status: DC
  Administered 2018-10-21 – 2018-10-27 (×7): 30 mg via ORAL
  Filled 2018-10-21 (×7): qty 1

## 2018-10-21 MED ORDER — METHOTREXATE 2.5 MG PO TABS
25.0000 mg | ORAL_TABLET | ORAL | Status: DC
  Administered 2018-10-23: 25 mg via ORAL
  Filled 2018-10-21: qty 10

## 2018-10-21 MED ORDER — IOPAMIDOL (ISOVUE-370) INJECTION 76%
INTRAVENOUS | Status: AC
  Administered 2018-10-21: 75 mL
  Filled 2018-10-21: qty 100

## 2018-10-21 MED ORDER — PANTOPRAZOLE SODIUM 40 MG PO TBEC
40.0000 mg | DELAYED_RELEASE_TABLET | Freq: Every day | ORAL | Status: DC
  Administered 2018-10-21 – 2018-10-27 (×7): 40 mg via ORAL
  Filled 2018-10-21 (×7): qty 1

## 2018-10-21 MED ORDER — ONDANSETRON HCL 4 MG/2ML IJ SOLN: 4.0000 mg | Freq: Four times a day (QID) | INTRAMUSCULAR | Status: DC | PRN

## 2018-10-21 MED ORDER — METHOTREXATE 2.5 MG PO TABS: 2.5000 mg | ORAL_TABLET | ORAL | Status: DC

## 2018-10-21 MED ORDER — ACETAMINOPHEN 650 MG RE SUPP: 650.0000 mg | Freq: Four times a day (QID) | RECTAL | Status: DC | PRN

## 2018-10-21 MED ORDER — FERROUS SULFATE 325 (65 FE) MG PO TABS
325.0000 mg | ORAL_TABLET | Freq: Every evening | ORAL | Status: DC
  Administered 2018-10-21 – 2018-10-26 (×6): 325 mg via ORAL
  Filled 2018-10-21 (×6): qty 1

## 2018-10-21 MED ORDER — SODIUM CHLORIDE 0.9 % IV BOLUS
500.0000 mL | Freq: Once | INTRAVENOUS | Status: AC
  Administered 2018-10-21: 500 mL via INTRAVENOUS

## 2018-10-21 MED ORDER — FUROSEMIDE 20 MG PO TABS
20.0000 mg | ORAL_TABLET | Freq: Every day | ORAL | Status: DC
  Administered 2018-10-21 – 2018-10-27 (×7): 20 mg via ORAL
  Filled 2018-10-21 (×7): qty 1

## 2018-10-21 MED ORDER — APIXABAN 5 MG PO TABS
10.0000 mg | ORAL_TABLET | Freq: Two times a day (BID) | ORAL | Status: DC
  Administered 2018-10-21 – 2018-10-27 (×12): 10 mg via ORAL
  Filled 2018-10-21 (×13): qty 2

## 2018-10-21 MED ORDER — OXYCODONE HCL 5 MG PO TABS
5.0000 mg | ORAL_TABLET | Freq: Four times a day (QID) | ORAL | Status: DC | PRN
  Administered 2018-10-21 – 2018-10-27 (×12): 5 mg via ORAL
  Filled 2018-10-21 (×12): qty 1

## 2018-10-21 NOTE — Evaluation (Signed)
Physical Therapy Evaluation Patient Details Name: Dominique Mckee MRN: [id number] DOB: 1930-03-28 Today's Date: 10/21/2018   History of Present Illness  84 y.o. female with medical history significant of diastolic CHF, RA.  Patient presents to ED today with c/o headache / neck pain, fatigue, N/V.  Headache and neck pain over past 4 days, R sided, severe, some dizziness.  Had N/V today.  Seemed to onset after falling asleep with "neck bent to the right" a couple of days ago per son.  Clinical Impression  Orders received for PT evaluation. Patient demonstrates deficits in functional mobility as indicated below. Will benefit from continued skilled PT to address deficits and maximize function. Will see as indicated and progress as tolerated.  O2 saturations stable on room air >92% throughout activity    Follow Up Recommendations Home health PT;Supervision/Assistance - 24 hour    Equipment Recommendations  None recommended by PT    Recommendations for Other Services       Precautions / Restrictions Precautions Precaution Comments: watch o2 sats      Mobility  Bed Mobility Overal bed mobility: Needs Assistance Bed Mobility: Rolling;Sidelying to Sit Rolling: Min assist Sidelying to sit: Mod assist       General bed mobility comments: moderate assist to elevate trunk to upright at EOB  Transfers Overall transfer level: Needs assistance Equipment used: Rolling walker (2 wheeled) Transfers: Sit to/from [facility] Sit to Stand: Mod assist Stand pivot transfers: Mod assist       General transfer comment: Moderate assist to power up to standing with use of RW, moderate assist to pivot to chair with face to face technique  Ambulation/Gait Ambulation/Gait assistance: Min assist Gait Distance (Feet): 3 Feet Assistive device: Rolling walker (2 wheeled) Gait Pattern/deviations: Step-to pattern        Stairs            Wheelchair Mobility    Modified  [place] (Stroke Patients Only)       Balance Overall balance assessment: Needs assistance Sitting-balance support: Feet supported Sitting balance-Leahy Scale: Fair Sitting balance - Comments: able to sit EOB without assist   Standing balance support: Bilateral upper extremity supported Standing balance-Leahy Scale: Poor                               Pertinent Vitals/Pain Pain Assessment: Faces Faces Pain Scale: Hurts even more Pain Location: back and neck pain chronically Pain Descriptors / Indicators: Aching Pain Intervention(s): Monitored during session    Home Living Family/patient expects to be discharged to:: Private residence Living Arrangements: Children Available Help at Discharge: Family Type of Home: House Home Access: Ramped entrance     Home Layout: One level Home Equipment: Bedside commode;Tub bench;Wheelchair - manual;Toilet riser;Walker - 2 wheels;Grab bars - tub/shower      Prior Function Level of Independence: Needs assistance   Gait / Transfers Assistance Needed: assist for transfers to wheel chair and bed mobility. utilizes w/c for mobility   ADL's / Homemaking Assistance Needed: assist for dressing bathing and feeding at baseline  Comments: patients son is caregiver     Hand Dominance   Dominant Hand: Right    Extremity/Trunk Assessment   Upper Extremity Assessment Upper Extremity Assessment: Generalized weakness    Lower Extremity Assessment Lower Extremity Assessment: RLE deficits/detail;LLE deficits/detail RLE Deficits / Details: generalized weakness 3-/5 gross motions RLE Coordination: decreased fine motor;decreased gross motor LLE Deficits / Details: noted significant  LLE weakness gross motions <3/5 with limited sensation distally LLE Sensation: decreased light touch;history of peripheral neuropathy LLE Coordination: decreased fine motor;decreased gross motor       Communication   Communication: HOH  Cognition  Arousal/Alertness: Awake/alert Behavior During Therapy: WFL for tasks assessed/performed Overall Cognitive Status: No family/caregiver present to determine baseline cognitive functioning                                        General Comments      Exercises     Assessment/Plan    PT Assessment Patient needs continued PT services  PT Problem List Decreased strength;Decreased balance;Decreased activity tolerance;Decreased mobility;Decreased coordination;Decreased safety awareness;Pain       PT Treatment Interventions DME instruction;Functional mobility training;Therapeutic activities;Therapeutic exercise;Balance training;Gait training;Neuromuscular re-education;Patient/family education    PT Goals (Current goals can be found in the Care Plan section)  Acute Rehab PT Goals Patient Stated Goal: to go home and have therapy PT Goal Formulation: With patient Time For Goal Achievement: 11/04/18 Potential to Achieve Goals: Fair    Frequency Min 2X/week   Barriers to discharge        Co-evaluation               AM-PAC PT "6 Clicks" Mobility  Outcome Measure Help needed turning from your back to your side while in a flat bed without using bedrails?: A Little Help needed moving from lying on your back to sitting on the side of a flat bed without using bedrails?: A Lot Help needed moving to and from a bed to a chair (including a wheelchair)?: A Lot Help needed standing up from a chair using your arms (e.g., wheelchair or bedside chair)?: A Lot Help needed to walk in hospital room?: A Lot Help needed climbing 3-5 steps with a railing? : Total 6 Click Score: 12    End of Session Equipment Utilized During Treatment: Gait belt;Oxygen Activity Tolerance: Patient tolerated treatment well Patient left: in chair;with call bell/phone within reach Nurse Communication: Mobility status PT Visit Diagnosis: Other symptoms and signs involving the nervous system  (R29.898);Dizziness and giddiness (R42);Muscle weakness (generalized) (M62.81)    Time: 3570-1779 PT Time Calculation (min) (ACUTE ONLY): 25 min   Charges:   PT Evaluation $PT Eval Moderate Complexity: 1 Mod          Alben Deeds, PT DPT  Board Certified Neurologic Specialist Acute Rehabilitation Services Pager [phone] Office [phone]   Duncan Dull 10/21/2018, 11:16 AM

## 2018-10-21 NOTE — Care Management Note (Signed)
Case Management Note  Patient Details  Name: Dominique Mckee MRN: [id number] Date of Birth: 1930-09-14  Subjective/Objective:     Presents with N/V.   Resides with son.      [name] (Son) Jonathon Resides [phone]        Action/Plan: Transition to home with home health services to follow when medically stable. Son to provide transportation to home.  Expected Discharge Date:                  Expected Discharge Plan:  [place]  In-House Referral:     Discharge planning Services  CM Consult  Post Acute Care Choice:    Choice offered to:  Patient, Spouse  DME Arranged:  N/A DME Agency:  NA  HH Arranged:  PT [place] Agency:  [facility], order placed, MD needs to completed F2F.  Status of Service:  Completed, signed off  If discussed at [place] of Stay Meetings, dates discussed:    Additional Comments:  Sharin Mons, RN 10/21/2018, 2:27 PM

## 2018-10-21 NOTE — CV Procedure (Signed)
2D echo attempted but patient eating in chair. Will try later

## 2018-10-21 NOTE — Progress Notes (Addendum)
PROGRESS NOTE    Patient: Dominique Mckee                            PCP: Wenda Low, MD                    DOB: 03-21-1930            DOA: 10/20/2018 WER:[id number]             DOS: 10/21/2018, 12:02 PM   LOS: 0 days   Date of Service: The patient was seen and examined on 10/21/2018  Subjective:   The patient was seen and examined this morning, was awake alert oriented, in no acute distress with exception of shortness of breath, still on 3 L of oxygen, satting 100%.  Patient reports she is never been on oxygen. Denies any chest pain.  Denies any fever chills or nausea vomiting.  Report that she is basically bedbound, ambulates only with assist and wheelchair. Reports of no extensive history of stroke /CVA, just progressive decline with debility, RA  She also confirms that she was in 2 different nursing home facility patient and her son was not satisfied therefore her son has taken her home recently.  Brief Narrative:   [name] BEGIN is a 84 y.o. female with medical history significant of diastolic CHF, RA, severe debility, bedbound, ambulating with assist, out of 2 different nursing homes back home. Patient presents to ED today with c/o headache / neck pain, fatigue, N/V.  Headache and neck pain over past 4 days, R sided, severe, some dizziness.  Had N/V today.  Seemed to onset after falling asleep with "neck bent to the right" a couple of days ago per son.  Was going to follow up with cardiology due to recent diagnosis of PAF, but sent in to ED for symptoms.   In the ED symptoms improved with treatment and believed to be musculoskeletal; however, patient was noted to be significantly hypoxic on RA with O2 sats in the mid to low 80s.  Improved on 3L via [place]  Principal Problem:   Hypoxia Active Problems:   Rheumatoid arthritis (HCC)   Nausea and vomiting   Chronic diastolic CHF (congestive heart failure) (HCC)    Assessment & Plan:   Acute hypoxia -unknown  etiology -Currently on 3 L of oxygen, satting 100%, planning to wean her down off oxygen -No signs of infection, influenza PCR negative -Patient is not wheezing, not complaining of chest pain -We cannot find the orders of d-dimer, will order CTA of the chest stat to rule out pulmonary embolism, evaluating lung structures and pathology -Patient needs inpatient PFT evaluation  -we will attempt to order and evaluate -Continue O2 via nasal cannula, with tapering down, PRN DuoNeb bronchodilators -Continue monitoring with continuous pulse ox   Addendum:   Acute pulmonary embolism./ Rt DVT  Called by radiology CTA of the chest positive for acute pulmonary embolism small with no right heart strain.  Findings were discussed in detail with the patient and her son on the phone Mr. Dominique Mckee The pros and cons of chronic anticoagulation was discussed with the patient's son in detail  Patient needs to be on anticoagulation for pulmonary embolism, paroxysmal atrial fibrillation We will start the patient on Eliquis today. Since patient has a normal creatinine, GFR >60 started on full dose of Eliquis 10 mg p.o. twice daily x7 days then 5 mg p.o. twice daily  DVT - lower extremity Doppler also reported right femoral calf pain positive for DVT  -anticoagulation as above -For any reason to not pursue with anticoagulation, IVC filter will be considered    Headaches, neck pain and dizziness  -Resolved -CT angiogram of head and neck within normal limits chronic changes consistent with age -CT cervical spine within normal limits  severe debility with chronic RA  -Home medication including methotrexate will be resumed  Nausea / vomiting -Improved no episodes of vomiting. -Continue PRN Zofran  Chronic diastolic congestive heart failure -Stable, mild shortness of breath, on 3 L of oxygen, no overt anasarca, mild +1 edema in lower extremities -2D echocardiogram reviewed from 09/16/2018, EJF 55-65 %,  moderate aortic regurgitation, severely dilated left atrium,  -We will follow with daily weights, proBNP, labs -Status post gentle IV fluid hydration IV fluid will be DC'd -We will resume her diuretics back tomorrow,  -Resume home medication including beta-blockers will be resumed -Troponin: negative   Severe debility/bedbound -PT/OT will be consulted for evaluation recommendation -According to the patient she has been to 2 different nursing home facilities, unhappy with their care.  Son has agreed to take care of her at home.---> We will reach out to patient's son to confirm  New diagnosis PFA  -Patient remains to be normal sinus rhythm, due to comorbidities frailty, debility patient is not a candidate for chronic anticoagulation therapy. -Continuing home beta-blocker -2D echocardiogram -reviewed   Comorbidities: Severe RA, essential tremors, diastolic CHF, CKD, degenerative disc disease, depression, fibromyalgia, debility gait disorder, GERD, hypertension, lumbar spondylosis, osteopenia,>>> reviewed, home medication reviewed will be resumed accordingly  Due to above comorbidities, continued decline, long-term prognosis remains to be poor.  DVT prophylaxis: SCD, Eliquis started  Code Status:   Code Status: Full Code  Family Communication:  Patient care and current findings and comorbidities was discussed extensively with patient's son who is active POA Mr. Dominique Mckee.  Bleeding initiation of anticoagulation. He expressed understanding and agreement with above plan.   Disposition Plan:   Anticipated 1-2 days  Consultants: None  Transthoracic Echocardiography/ Study Conclusions: 09/16/2018  - Left ventricle: The cavity size was normal. Wall thickness was   normal. Systolic function was normal. The estimated ejection   fraction was in the range of 55% to 65%. - Ventricular septum: Septal motion showed paradox likely due to   intraventricular conduction delay. - Aortic  valve: There was moderate regurgitation. - Mitral valve: Severely fibrotic annulus. Moderately calcified   leaflets . There was severe regurgitation directed posteriorly. - Left atrium: The atrium was severely dilated. - Tricuspid valve: Mildly dilated annulus. There was mild-moderate   regurgitation.  CT angiogram of head and neck on 09/20/2018  IMPRESSION: 1. No emergent large vessel occlusion or high-grade stenosis. 2. Bilateral carotid bifurcation atherosclerosis without hemodynamically significant stenosis. 3. Mild atrophy and chronic microvascular disease. Aortic atherosclerosis (ICD10-I70.0).  CT cervical spine with contrast IMPRESSION: 1. No acute osseous abnormality in the cervical spine. 2. Advanced cervical spine degeneration with progression since 2018. - severe right C1-C2 joint space loss. - degenerative ankylosis is developing at the left posterior elements of C3 through C5. - multilevel mild spinal stenosis. 3. CTA head and neck today are reported separately.   Procedures:   No admission procedures for hospital encounter.     Antimicrobials:  Anti-infectives (From admission, onward)   None       Medication:  . calcium carbonate  1 tablet Oral BID WC  . DULoxetine  30 mg  Oral Daily  . enoxaparin (LOVENOX) injection  40 mg Subcutaneous Daily  . ferrous sulfate  325 mg Oral QPM  . folic acid  1 mg Oral Daily  . furosemide  20 mg Oral Daily  . [START ON 10/23/2018] methotrexate  25 mg Oral Weekly  . pantoprazole  40 mg Oral Daily  . propranolol  20 mg Oral BID    acetaminophen **OR** acetaminophen, ondansetron **OR** ondansetron (ZOFRAN) IV, oxyCODONE     Objective:   Vitals:   10/20/18 1730 10/21/18 0024 10/21/18 0215 10/21/18 0544  BP: (!) 140/99 (!) 131/53 (!) 150/68 (!) 153/70  Pulse: (!) 55 62 (!) 58 (!) 57  Resp: (!) 22 14    Temp:   98 F (36.7 C) (!) 97.4 F (36.3 C)  TempSrc:   Oral Oral  SpO2: 97% 99% 100% 100%  Weight:       Height:        Intake/Output Summary (Last 24 hours) at 10/21/2018 1202 Last data filed at 10/21/2018 1042 Gross per 24 hour  Intake 500 ml  Output 250 ml  Net 250 ml   Filed Weights   10/20/18 1407  Weight: 77.1 kg     Examination:    General exam: Appears calm and comfortable  BP (!) 153/70 (BP Location: Left Arm)   Pulse (!) 57   Temp (!) 97.4 F (36.3 C) (Oral)   Resp 14   Ht 5\' 3"  (1.6 m)   Wt 77.1 kg   SpO2 100%   BMI 30.11 kg/m    Physical Exam  Constitution:  Alert, cooperative, no distress,  Psychiatric: Normal and stable mood and affect, cognition intact,   HEENT: Normocephalic, PERRL, otherwise with in Normal limits  Chest:Chest symmetric Cardio vascular:  S1/S2, RRR, No murmure, No Rubs or Gallops  pulmonary: Clear to auscultation bilaterally, respirations unlabored, negative wheezes / crackles Abdomen: Soft, non-tender, non-distended, bowel sounds,no masses, no organomegaly Muscular skeletal: Limited exam - in bed, able to move all 4 extremities, Normal strength,  Neuro: CNII-XII intact. , normal motor and sensation, reflexes intact  Extremities: No pitting edema lower extremities, +2 pulses  Skin: Dry, warm to touch, negative for any Rashes, No open wounds Wounds: per nursing documentation  LABs:  CBC Latest Ref Rng & Units 10/20/2018 10/24/2017 08/23/2017  WBC 4.0 - 10.5 K/uL 8.2 13.0(H) -  Hemoglobin 12.0 - 15.0 g/dL 13.1 11.1(L) 12.6  Hematocrit 36.0 - 46.0 % 42.8 34.9(L) 37.0  Platelets 150 - 400 K/uL 220 270 -   CMP Latest Ref Rng & Units 10/20/2018 10/24/2017 08/23/2017  Glucose 70 - 99 mg/dL 142(H) 123(H) 107(H)  BUN 8 - 23 mg/dL 19 20 -  Creatinine 0.44 - 1.00 mg/dL 0.79 0.67 -  Sodium 135 - 145 mmol/L 142 141 140  Potassium 3.5 - 5.1 mmol/L 3.6 3.2(L) 4.2  Chloride 98 - 111 mmol/L 102 105 -  CO2 22 - 32 mmol/L 25 28 -  Calcium 8.9 - 10.3 mg/dL 8.9 8.8(L) -  Total Protein 6.5 - 8.1 g/dL 7.5 7.0 -  Total Bilirubin 0.3 - 1.2 mg/dL  1.9(H) 0.8 -  Alkaline Phos 38 - 126 U/L 59 91 -  AST 15 - 41 U/L 31 20 -  ALT 0 - 44 U/L 13 16 -

## 2018-10-21 NOTE — Progress Notes (Signed)
Bilateral lower extremities venous duplex exam completed.  Positive for DVT. More details please see preliminary notes on CV PROC under chart review. Result notified RN Jocelyn Lamer, and she paged the provider. Keylin Podolsky H Toris Laverdiere(RDMS RVT) 10/21/18 3:52 PM

## 2018-10-21 NOTE — Progress Notes (Signed)
  Echocardiogram 2D Echocardiogram has been performed.  Dominique Mckee 10/21/2018, 3:08 PM

## 2018-10-21 NOTE — H&P (Signed)
History and Physical    Dominique Mckee YQI:[id number] DOB: December 04, 1929 DOA: 10/20/2018  PCP: Wenda Low, MD  Patient coming from: Home  I have personally briefly reviewed patient's old medical records in [place]  Chief Complaint: N/V, dizziness  HPI: Dominique Mckee is a 84 y.o. female with medical history significant of diastolic CHF, RA.  Patient presents to ED today with c/o headache / neck pain, fatigue, N/V.  Headache and neck pain over past 4 days, R sided, severe, some dizziness.  Had N/V today.  Seemed to onset after falling asleep with "neck bent to the right" a couple of days ago per son.  Was going to follow up with cardiology today due to recent diagnosis of PAF, but sent in to ED for symptoms.   ED Course: In the ED symptoms improved with treatment and believed to be musculoskeletal; however, patient was noted to be significantly hypoxic on RA with O2 sats in the mid to low 80s.  Improved on 3L via [place].  CXR neg.   Review of Systems: As per HPI otherwise 10 point review of systems negative.   Past Medical History:  Diagnosis Date  . Arthritis   . Benign essential tremor   . Cervical spondylosis   . Cervicogenic headache   . CHF (congestive heart failure) ([place])   . Chronic renal insufficiency   . Degenerative disc disease   . Depression   . Fibromyalgia   . Gait disorder   . GERD (gastroesophageal reflux disease)   . Heart murmur   . HOH (hard of hearing)   . Hypertension   . Lumbar spondylosis   . Migraine headache   . Mild obesity   . Osteopenia   . Rheumatoid arthritis [facility])     Past Surgical History:  Procedure Laterality Date  . ABDOMINAL HYSTERECTOMY    . APPENDECTOMY    . CHOLECYSTECTOMY    . COLONOSCOPY    . ELBOW SURGERY Left   . HAMMER TOE SURGERY Left   . LUMBAR FUSION  02/2017  . SHOULDER CLOSED REDUCTION Right 07/08/2015   Procedure: RIGHT SHOULDER CLOSED REDUCTION;  Surgeon: Meredith Pel, MD;  Location: [place];  Service:  Orthopedics;  Laterality: Right;  . SHOULDER CLOSED REDUCTION Left 08/23/2017   Procedure: CLOSED REDUCTION LEFT SHOULDER;  Surgeon: Meredith Pel, MD;  Location: [place];  Service: Orthopedics;  Laterality: Left;  . TONSILLECTOMY    . TOTAL KNEE ARTHROPLASTY Right    right     reports that she has never smoked. She has never used smokeless tobacco. She reports that she does not drink alcohol or use drugs.  Allergies  Allergen Reactions  . Feldene [Piroxicam]     Kidney issues  . Clinoril [Sulindac] Rash  . Morphine And Related Nausea And Vomiting    Family History  Problem Relation Age of Onset  . Osteoporosis Mother   . Intracerebral hemorrhage Mother 62  . AAA (abdominal aortic aneurysm) Father 58  . Lung cancer Brother   . Diabetes Brother   . Lung cancer Brother   . Diabetes Sister   . Lung cancer Sister   . Bone cancer Sister   . Kidney failure Son        left kidney transplant  . Diabetes type I Daughter   . Anesthesia problems Neg Hx   . Hypertension Neg Hx      Prior to Admission medications   Medication Sig Start Date End Date Taking?  Authorizing Provider  acetaminophen (TYLENOL) 500 MG tablet Take 500 mg by mouth every 6 (six) hours as needed for mild pain.    [provider]  Calcium Carb-Cholecalciferol (CALCIUM 600+D3) 600-200 MG-UNIT TABS Take 1 tablet by mouth daily.    [provider]  calcium carbonate (TUMS - DOSED IN MG ELEMENTAL CALCIUM) 500 MG chewable tablet Chew 1 tablet by mouth 2 (two) times daily.    [provider]  diclofenac sodium (VOLTAREN) 1 % GEL Apply 1 application topically 2 (two) times daily as needed (pain).     [provider]  DULoxetine (CYMBALTA) 30 MG capsule Take 30 mg by mouth daily. 10/05/17   [provider]  feeding supplement, ENSURE ENLIVE, (ENSURE ENLIVE) LIQD Take 237 mLs by mouth 3 (three) times daily between meals. 08/26/17   Meredith Pel, MD  ferrous sulfate 325  (65 FE) MG tablet Take 325 mg by mouth every evening.    [provider]  folic acid (FOLVITE) 1 MG tablet Take 1 mg by mouth daily.  12/29/13   [provider]  furosemide (LASIX) 20 MG tablet Take 20 mg by mouth daily.    [provider]  gabapentin (NEURONTIN) 300 MG capsule Take 900 mg by mouth at bedtime. 09/11/17   [provider]  Melatonin 3 MG CAPS Take 3 mg by mouth at bedtime.    [provider]  methotrexate (RHEUMATREX) 2.5 MG tablet Take 2.5 mg by mouth once a week. Caution:Chemotherapy. Protect from light.    [provider]  oxyCODONE (ROXICODONE) 5 MG immediate release tablet Take 5 mg by mouth every 6 (six) hours as needed for severe pain.    [provider]  pantoprazole (PROTONIX) 40 MG tablet Take 40 mg by mouth daily.    [provider]  polyethylene glycol powder (MIRALAX) powder Take 17 g by mouth daily. 07/10/15   Janece Canterbury, MD  propranolol (INDERAL) 20 MG tablet Take 1 tablet (20 mg total) by mouth 2 (two) times daily. 07/10/15   Janece Canterbury, MD  senna (SENOKOT) 8.6 MG TABS tablet Take 2 tablets (17.2 mg total) by mouth at bedtime. 07/10/15   Janece Canterbury, MD    Physical Exam: Vitals:   10/20/18 1530 10/20/18 1726 10/20/18 1730 10/21/18 0024  BP: (!) 169/82 (!) 153/77 (!) 140/99 (!) 131/53  Pulse: 63 61 (!) 55 62  Resp: 13 16 (!) 22 14  Temp:      TempSrc:      SpO2: 92% 100% 97% 99%  Weight:      Height:        Constitutional: NAD, calm, comfortable Eyes: PERRL, lids and conjunctivae normal ENMT: Mucous membranes are moist. Posterior pharynx clear of any exudate or lesions.Normal dentition.  Neck: normal, supple, no masses, no thyromegaly Respiratory: clear to auscultation bilaterally, no wheezing, no crackles. Normal respiratory effort. No accessory muscle use.  Cardiovascular: Regular rate and rhythm, no murmurs / rubs / gallops. No extremity edema. 2+ pedal pulses. No  carotid bruits.  Abdomen: no tenderness, no masses palpated. No hepatosplenomegaly. Bowel sounds positive.  Musculoskeletal: no clubbing / cyanosis. No joint deformity upper and lower extremities. Good ROM, no contractures. Normal muscle tone.  Skin: no rashes, lesions, ulcers. No induration Neurologic: CN 2-12 grossly intact. Sensation intact, DTR normal. Strength 5/5 in all 4.  Psychiatric: Normal judgment and insight. Alert and oriented x 3. Normal mood.    Labs on Admission: I have personally reviewed following labs  and imaging studies  CBC: Recent Labs  Lab 10/20/18 1643  WBC 8.2  NEUTROABS 6.8  HGB 13.1  HCT 42.8  MCV 102.6*  PLT 284   Basic Metabolic Panel: Recent Labs  Lab 10/20/18 1423 10/20/18 1520  NA 142  --   K 3.6  --   CL 102  --   CO2 25  --   GLUCOSE 142*  --   BUN 19  --   CREATININE 0.79  --   CALCIUM 8.9  --   MG  --  1.9   GFR: Estimated Creatinine Clearance: 49.6 mL/min (by C-G formula based on SCr of 0.79 mg/dL). Liver Function Tests: Recent Labs  Lab 10/20/18 1423  AST 31  ALT 13  ALKPHOS 59  BILITOT 1.9*  PROT 7.5  ALBUMIN 3.6   Recent Labs  Lab 10/20/18 1423  LIPASE 25   No results for input(s): AMMONIA in the last 168 hours. Coagulation Profile: No results for input(s): INR, PROTIME in the last 168 hours. Cardiac Enzymes: No results for input(s): CKTOTAL, CKMB, CKMBINDEX, TROPONINI in the last 168 hours. BNP (last 3 results) No results for input(s): PROBNP in the last 8760 hours. HbA1C: No results for input(s): HGBA1C in the last 72 hours. CBG: No results for input(s): GLUCAP in the last 168 hours. Lipid Profile: No results for input(s): CHOL, HDL, LDLCALC, TRIG, CHOLHDL, LDLDIRECT in the last 72 hours. Thyroid Function Tests: Recent Labs    10/20/18 1541  TSH 0.781   Anemia Panel: No results for input(s): VITAMINB12, FOLATE, FERRITIN, TIBC, IRON, RETICCTPCT in the last 72 hours. Urine analysis:    Component Value  Date/Time   COLORURINE YELLOW 01/25/2012 [place] 01/25/2012 1509   LABSPEC 1.020 01/25/2012 1509   PHURINE 5.5 01/25/2012 1509   GLUCOSEU NEGATIVE 01/25/2012 1509   HGBUR MODERATE (A) 01/25/2012 1509   BILIRUBINUR NEGATIVE 01/25/2012 1509   KETONESUR NEGATIVE 01/25/2012 1509   PROTEINUR NEGATIVE 01/25/2012 1509   UROBILINOGEN 0.2 01/25/2012 1509   NITRITE NEGATIVE 01/25/2012 1509   LEUKOCYTESUR NEGATIVE 01/25/2012 1509    Radiological Exams on Admission: Ct Angio Head W Or Wo Contrast  Result Date: 10/20/2018 CLINICAL DATA:  Altered mental status. Weakness and dizziness. Headache with nausea and vomiting. EXAM: CT ANGIOGRAPHY HEAD AND NECK TECHNIQUE: Multidetector CT imaging of the head and neck was performed using the standard protocol during bolus administration of intravenous contrast. Multiplanar CT image reconstructions and MIPs were obtained to evaluate the vascular anatomy. Carotid stenosis measurements (when applicable) are obtained utilizing NASCET criteria, using the distal internal carotid diameter as the denominator. CONTRAST:  22mL ISOVUE-370 IOPAMIDOL (ISOVUE-370) INJECTION 76% COMPARISON:  08/19/2017 FINDINGS: CT HEAD FINDINGS Brain: There is no mass, hemorrhage or extra-axial collection. There is generalized atrophy without lobar predilection. There is hypoattenuation of the periventricular white matter, most commonly indicating chronic ischemic microangiopathy. Skull: The visualized skull base, calvarium and extracranial soft tissues are normal. Sinuses/Orbits: No fluid levels or advanced mucosal thickening of the visualized paranasal sinuses. No mastoid or middle ear effusion. The orbits are normal. CTA NECK FINDINGS SKELETON: There is no bony spinal canal stenosis. No lytic or blastic lesion. OTHER NECK: Normal pharynx, larynx and major salivary glands. No cervical lymphadenopathy. Unremarkable thyroid gland. UPPER CHEST: No pneumothorax or pleural effusion. No  nodules or masses. AORTIC ARCH: There is mild calcific atherosclerosis of the aortic arch. There is no aneurysm, dissection or hemodynamically significant stenosis of the visualized ascending aorta and aortic arch.  Conventional 3 vessel aortic branching pattern. The visualized proximal subclavian arteries are widely patent. RIGHT CAROTID SYSTEM: --Common carotid artery: Widely patent origin without common carotid artery dissection or aneurysm. --Internal carotid artery: No dissection, occlusion or aneurysm. Mild atherosclerotic calcification at the carotid bifurcation without hemodynamically significant stenosis. --External carotid artery: No acute abnormality. LEFT CAROTID SYSTEM: --Common carotid artery: Widely patent origin without common carotid artery dissection or aneurysm. --Internal carotid artery: No dissection, occlusion or aneurysm. There is predominantly calcified atherosclerosis extending into the proximal ICA, resulting in less than 50% stenosis. --External carotid artery: No acute abnormality. VERTEBRAL ARTERIES: Codominant configuration. Both origins are normal. No dissection, occlusion or flow-limiting stenosis to the vertebrobasilar confluence. CTA HEAD FINDINGS ANTERIOR CIRCULATION: --Intracranial internal carotid arteries: Atherosclerotic calcification of the internal carotid arteries at the skull base without hemodynamically significant stenosis. --Anterior cerebral arteries: Normal. Both A1 segments are present. Patent anterior communicating artery. --Middle cerebral arteries: Normal. --Posterior communicating arteries: Present on the left, absent on the right. POSTERIOR CIRCULATION: --Basilar artery: Normal. --Posterior cerebral arteries: Normal right. Fetal origin of the left. Mild stenosis of the proximal left P2 segment. --Superior cerebellar arteries: Normal. --Inferior cerebellar arteries: Normal anterior and posterior inferior cerebellar arteries. VENOUS SINUSES: As permitted by contrast  timing, patent. ANATOMIC VARIANTS: Fetal origin of the left posterior cerebral artery. This is a normal anatomic variant. DELAYED PHASE: No parenchymal contrast enhancement. Review of the MIP images confirms the above findings. IMPRESSION: 1. No emergent large vessel occlusion or high-grade stenosis. 2. Bilateral carotid bifurcation atherosclerosis without hemodynamically significant stenosis. 3. Mild atrophy and chronic microvascular disease. Aortic atherosclerosis (ICD10-I70.0). Electronically Signed   By: Ulyses Jarred M.D.   On: 10/20/2018 17:58   Dg Chest 2 View  Result Date: 10/20/2018 CLINICAL DATA:  Generalized weakness, hypoxia, chills EXAM: CHEST - 2 VIEW COMPARISON:  10/24/2017 FINDINGS: Cardiomegaly. Large hiatal hernia. No confluent airspace opacities or effusions. No acute bony abnormality. IMPRESSION: Cardiomegaly, hiatal hernia.  No active disease. Electronically Signed   By: Rolm Baptise M.D.   On: 10/20/2018 16:31   Ct Angio Neck W And/or Wo Contrast  Result Date: 10/20/2018 CLINICAL DATA:  Altered mental status. Weakness and dizziness. Headache with nausea and vomiting. EXAM: CT ANGIOGRAPHY HEAD AND NECK TECHNIQUE: Multidetector CT imaging of the head and neck was performed using the standard protocol during bolus administration of intravenous contrast. Multiplanar CT image reconstructions and MIPs were obtained to evaluate the vascular anatomy. Carotid stenosis measurements (when applicable) are obtained utilizing NASCET criteria, using the distal internal carotid diameter as the denominator. CONTRAST:  59mL ISOVUE-370 IOPAMIDOL (ISOVUE-370) INJECTION 76% COMPARISON:  08/19/2017 FINDINGS: CT HEAD FINDINGS Brain: There is no mass, hemorrhage or extra-axial collection. There is generalized atrophy without lobar predilection. There is hypoattenuation of the periventricular white matter, most commonly indicating chronic ischemic microangiopathy. Skull: The visualized skull base, calvarium  and extracranial soft tissues are normal. Sinuses/Orbits: No fluid levels or advanced mucosal thickening of the visualized paranasal sinuses. No mastoid or middle ear effusion. The orbits are normal. CTA NECK FINDINGS SKELETON: There is no bony spinal canal stenosis. No lytic or blastic lesion. OTHER NECK: Normal pharynx, larynx and major salivary glands. No cervical lymphadenopathy. Unremarkable thyroid gland. UPPER CHEST: No pneumothorax or pleural effusion. No nodules or masses. AORTIC ARCH: There is mild calcific atherosclerosis of the aortic arch. There is no aneurysm, dissection or hemodynamically significant stenosis of the visualized ascending aorta and aortic arch. Conventional 3 vessel aortic branching pattern. The visualized proximal subclavian  arteries are widely patent. RIGHT CAROTID SYSTEM: --Common carotid artery: Widely patent origin without common carotid artery dissection or aneurysm. --Internal carotid artery: No dissection, occlusion or aneurysm. Mild atherosclerotic calcification at the carotid bifurcation without hemodynamically significant stenosis. --External carotid artery: No acute abnormality. LEFT CAROTID SYSTEM: --Common carotid artery: Widely patent origin without common carotid artery dissection or aneurysm. --Internal carotid artery: No dissection, occlusion or aneurysm. There is predominantly calcified atherosclerosis extending into the proximal ICA, resulting in less than 50% stenosis. --External carotid artery: No acute abnormality. VERTEBRAL ARTERIES: Codominant configuration. Both origins are normal. No dissection, occlusion or flow-limiting stenosis to the vertebrobasilar confluence. CTA HEAD FINDINGS ANTERIOR CIRCULATION: --Intracranial internal carotid arteries: Atherosclerotic calcification of the internal carotid arteries at the skull base without hemodynamically significant stenosis. --Anterior cerebral arteries: Normal. Both A1 segments are present. Patent anterior  communicating artery. --Middle cerebral arteries: Normal. --Posterior communicating arteries: Present on the left, absent on the right. POSTERIOR CIRCULATION: --Basilar artery: Normal. --Posterior cerebral arteries: Normal right. Fetal origin of the left. Mild stenosis of the proximal left P2 segment. --Superior cerebellar arteries: Normal. --Inferior cerebellar arteries: Normal anterior and posterior inferior cerebellar arteries. VENOUS SINUSES: As permitted by contrast timing, patent. ANATOMIC VARIANTS: Fetal origin of the left posterior cerebral artery. This is a normal anatomic variant. DELAYED PHASE: No parenchymal contrast enhancement. Review of the MIP images confirms the above findings. IMPRESSION: 1. No emergent large vessel occlusion or high-grade stenosis. 2. Bilateral carotid bifurcation atherosclerosis without hemodynamically significant stenosis. 3. Mild atrophy and chronic microvascular disease. Aortic atherosclerosis (ICD10-I70.0). Electronically Signed   By: Ulyses Jarred M.D.   On: 10/20/2018 17:58   Ct C-spine No Charge  Result Date: 10/20/2018 CLINICAL DATA:  84 year old female with altered mental status, neck pain, generalized weakness, dizziness. EXAM: CT CERVICAL SPINE WITH CONTRAST TECHNIQUE: Multiplanar CT images of the cervical spine were reconstructed from contemporary CTA of the Neck. CONTRAST:  No additional COMPARISON:  CTA head and neck today reported separately. Cervical spine CT 08/19/2017. FINDINGS: Alignment: Stable since 2018. Straightening and mild reversal of cervical lordosis with mild degenerative appearing anterolisthesis of C3 on C4 and C7 on T1. Bilateral posterior element alignment is within normal limits. Skull base and vertebrae: Visualized skull base is intact. No atlanto-occipital dissociation. No acute osseous abnormality identified in the cervical spine. Soft tissues and spinal canal: No prevertebral fluid or swelling. No visible canal hematoma. Arterial and  other neck soft tissue findings are reported on the CTA today separately. Disc levels: Progressed since 2018 and severe right side C1-C2 joint space loss. Advanced disc and endplate degeneration except at C2-C3 and C7-T1. Advanced facet degeneration throughout the left cervical spine. Possible developing posterior element ankylosis on the left at C3-C4, while ankylosis has developed since 2018 on the left at C4-C5. Mild chronic spinal stenosis suspected C3-C4 through C5-C6. Upper chest: Visible upper thoracic levels appear grossly intact. Partially visible advanced right shoulder degeneration. Mild nonspecific upper lung mosaic attenuation, probably gas trapping. Other upper chest findings reported separately on the CTA today. Other: Severe TMJ degeneration greater on the right. IMPRESSION: 1. No acute osseous abnormality in the cervical spine. 2. Advanced cervical spine degeneration with progression since 2018. - severe right C1-C2 joint space loss. - degenerative ankylosis is developing at the left posterior elements of C3 through C5. - multilevel mild spinal stenosis. 3. CTA head and neck today are reported separately. Electronically Signed   By: Genevie Ann M.D.   On: 10/20/2018 17:57  EKG: Independently reviewed.  Assessment/Plan Principal Problem:   Hypoxia Active Problems:   Rheumatoid arthritis (HCC)   Nausea and vomiting   Chronic diastolic CHF (congestive heart failure) ([place])    1. Hypoxia - 1. Checking Influenza PCR first since this would neatly explain her onset of symptoms today, and we happen to be seeing significant flu activity in the area with numerous confirmed cases. 2. Cont pulse ox 3. Further work up if Influenza PCR neg: 1. D.Dimer 2. CTA chest 3. Possibly PFTs 2. RA - cont home meds once confirmed by son (unless flu positive in which case hold immunosuppressives). 3. N/V - 1. Zofran PRN 4. Chronic diastolic CHF - Cont home meds  DVT prophylaxis: Lovenox Code Status:  Full Family Communication: No family in room Disposition Plan: Home after admit Consults called: None Admission status: Place in obs - convert to IP if still needing O2 tomorrow    Etta Quill DO [facility] Pager [phone] Only works nights!  If 7AM-7PM, please contact the primary day team physician taking care of patient  [email or web address] Password [email or web address]  10/21/2018, 1:14 AM

## 2018-10-22 DIAGNOSIS — M069 Rheumatoid arthritis, unspecified: Secondary | ICD-10-CM | POA: Diagnosis not present

## 2018-10-22 DIAGNOSIS — R531 Weakness: Secondary | ICD-10-CM | POA: Diagnosis not present

## 2018-10-22 DIAGNOSIS — I5032 Chronic diastolic (congestive) heart failure: Secondary | ICD-10-CM | POA: Diagnosis not present

## 2018-10-22 DIAGNOSIS — Z66 Do not resuscitate: Secondary | ICD-10-CM

## 2018-10-22 DIAGNOSIS — Z515 Encounter for palliative care: Secondary | ICD-10-CM | POA: Diagnosis not present

## 2018-10-22 DIAGNOSIS — R0902 Hypoxemia: Secondary | ICD-10-CM | POA: Diagnosis not present

## 2018-10-22 DIAGNOSIS — M542 Cervicalgia: Secondary | ICD-10-CM

## 2018-10-22 LAB — BASIC METABOLIC PANEL
Anion gap: 11 (ref 5–15)
BUN: 11 mg/dL (ref 8–23)
CALCIUM: 8.5 mg/dL — AB (ref 8.9–10.3)
CO2: 32 mmol/L (ref 22–32)
Chloride: 99 mmol/L (ref 98–111)
Creatinine, Ser: 0.76 mg/dL (ref 0.44–1.00)
GFR calc Af Amer: >60 mL/min (ref >60)
Glucose, Bld: 95 mg/dL (ref 70–99)
Potassium: 2.6 mmol/L — CL (ref 3.5–5.1)
Sodium: 142 mmol/L (ref 135–145)

## 2018-10-22 MED ORDER — POTASSIUM CHLORIDE CRYS ER 20 MEQ PO TBCR
40.0000 meq | EXTENDED_RELEASE_TABLET | Freq: Once | ORAL | Status: AC
  Administered 2018-10-22: 40 meq via ORAL
  Filled 2018-10-22: qty 2

## 2018-10-22 NOTE — Consult Note (Signed)
Consultation Note Date: 10/22/2018   Patient Name: Dominique Mckee  DOB: 1930-02-08  MRN: [id number]  Age / Sex: 84 y.o., female  PCP: Wenda Low, MD Referring Physician: Roxan Hockey, MD  Reason for Consultation: Establishing goals of care and Psychosocial/spiritual support  HPI/Patient Profile: 84 y.o. female  admitted on 10/20/2018 with past medical history significant of diastolic CHF, RA, chronic pain.   Patient  to ED today with c/o headache / neck pain, fatigue, N/V.   Was  to follow up with cardiology for recent diagnosis of PAF, but sent in to ED for symptoms.  Per son today he reports continued physical and functional decline over the past several years, however she is "much better" than she was one year ago.  Patient and her family face treatment option decisions, advanced directive decisions and anticipatory care needs.   Clinical Assessment and Goals of Care:  This NP Wadie Lessen reviewed medical records, received report from team, assessed the patient and then meet at the patient's bedside along with her son/Dominique and friend of the family Dominique Mckee  to discuss diagnosis, prognosis, GOC, EOL wishes disposition and options.  Concept of Hospice and Palliative Care were discussed  A detailed discussion was had today regarding advanced directives.  Concepts specific to code status, artifical feeding and hydration, continued IV antibiotics and rehospitalization was had.  The difference between a aggressive medical intervention path  and a palliative comfort care path for this patient at this time was had.  Values and goals of care important to patient and family were attempted to be elicited.  MOST form introduced    Questions and concerns addressed.   Family encouraged to call with questions or concerns.    PMT will continue to support holistically.  Son/Dominique Mckee is main Warehouse manager    SUMMARY OF RECOMMENDATIONS    Code Status/Advance Care Planning:  DNR-docuemnted today Treat the treatable and discharge home when stable with as much support services patient is eligible for to augment care in the home.   Symptom Management:   Weakness: PT/OT with HH on discharge  We discussed implementing AROM exercises into daily routine.   Palliative Prophylaxis:   Delirium Protocol, Frequent Pain Assessment and Oral Care  Additional Recommendations (Limitations, Scope, Preferences):  Full Scope Treatment  Psycho-social/Spiritual:   Desire for further Chaplaincy support:no  Additional Recommendations: Education on Hospice and Palliative services in the community  Prognosis:   Unable to determine  Discharge Planning: Recommend Palliative services at home on discharge    Home with Home Health      Primary Diagnoses: Present on Admission: . Hypoxia . Nausea and vomiting . Rheumatoid arthritis ([place]) . Chronic diastolic CHF (congestive heart failure) ([place])   I have reviewed the medical record, interviewed the patient and family, and examined the patient. The following aspects are pertinent.  Past Medical History:  Diagnosis Date  . Arthritis   . Benign essential tremor   . Cervical spondylosis   . Cervicogenic headache   . CHF (  congestive heart failure) ([place])   . Chronic renal insufficiency   . Degenerative disc disease   . Depression   . Fibromyalgia   . Gait disorder   . GERD (gastroesophageal reflux disease)   . Heart murmur   . HOH (hard of hearing)   . Hypertension   . Lumbar spondylosis   . Migraine headache   . Mild obesity   . Osteopenia   . Rheumatoid arthritis ([place])    Social History   Socioeconomic History  . Marital status: Widowed    Spouse name: Not on file  . Number of children: 2  . Years of education: 42  . Highest education level: Not on file  Occupational History  . Occupation: Retired    Fish farm manager: RETIRED    Social Needs  . Financial resource strain: Not on file  . Food insecurity:    Worry: Not on file    Inability: Not on file  . Transportation needs:    Medical: Not on file    Non-medical: Not on file  Tobacco Use  . Smoking status: Never Smoker  . Smokeless tobacco: Never Used  Substance and Sexual Activity  . Alcohol use: No  . Drug use: No  . Sexual activity: Not Currently  Lifestyle  . Physical activity:    Days per week: Not on file    Minutes per session: Not on file  . Stress: Not on file  Relationships  . Social connections:    Talks on phone: Not on file    Gets together: Not on file    Attends religious service: Not on file    Active member of club or organization: Not on file    Attends meetings of clubs or organizations: Not on file    Relationship status: Not on file  Other Topics Concern  . Not on file  Social History Narrative   Patient is right handed.   Patient drinks very little caffeine.   Family History  Problem Relation Age of Onset  . Osteoporosis Mother   . Intracerebral hemorrhage Mother 16  . AAA (abdominal aortic aneurysm) Father 74  . Lung cancer Brother   . Diabetes Brother   . Lung cancer Brother   . Diabetes Sister   . Lung cancer Sister   . Bone cancer Sister   . Kidney failure Son        left kidney transplant  . Diabetes type I Daughter   . Anesthesia problems Neg Hx   . Hypertension Neg Hx    Scheduled Meds: . apixaban  10 mg Oral BID  . calcium carbonate  1 tablet Oral BID WC  . DULoxetine  30 mg Oral Daily  . ferrous sulfate  325 mg Oral QPM  . folic acid  1 mg Oral Daily  . furosemide  20 mg Oral Daily  . [START ON 10/23/2018] methotrexate  25 mg Oral Weekly  . pantoprazole  40 mg Oral Daily  . potassium chloride  40 mEq Oral Once  . potassium chloride  40 mEq Oral Once  . propranolol  20 mg Oral BID   Continuous Infusions: PRN Meds:.acetaminophen **OR** acetaminophen, ondansetron **OR** ondansetron (ZOFRAN) IV,  oxyCODONE Medications Prior to Admission:  Prior to Admission medications   Medication Sig Start Date End Date Taking? Authorizing Provider  acetaminophen (TYLENOL) 500 MG tablet Take 500 mg by mouth every 6 (six) hours as needed for mild pain.   Yes [provider]  Calcium Carb-Cholecalciferol (CALCIUM 600+D3)  600-200 MG-UNIT TABS Take 1 tablet by mouth daily.   Yes [provider]  calcium carbonate (TUMS - DOSED IN MG ELEMENTAL CALCIUM) 500 MG chewable tablet Chew 1 tablet by mouth 2 (two) times daily as needed for indigestion.    Yes [provider]  diclofenac sodium (VOLTAREN) 1 % GEL Apply 1 application topically 2 (two) times daily as needed (pain).    Yes [provider]  DULoxetine (CYMBALTA) 30 MG capsule Take 30 mg by mouth daily. 10/05/17  Yes [provider]  ferrous sulfate 325 (65 FE) MG tablet Take 325 mg by mouth every evening.   Yes [provider]  folic acid (FOLVITE) 1 MG tablet Take 1 mg by mouth daily.  12/29/13  Yes [provider]  furosemide (LASIX) 20 MG tablet Take 20 mg by mouth daily.   Yes [provider]  gabapentin (NEURONTIN) 300 MG capsule Take 900 mg by mouth at bedtime. 09/11/17  Yes [provider]  methotrexate (RHEUMATREX) 2.5 MG tablet Take 25 mg by mouth once a week. Caution:Chemotherapy. Protect from light. THURSDAYS   Yes [provider]  oxyCODONE (ROXICODONE) 5 MG immediate release tablet Take 5 mg by mouth every 6 (six) hours as needed for severe pain.   Yes [provider]  pantoprazole (PROTONIX) 40 MG tablet Take 40 mg by mouth daily as needed (acid reflux).    Yes [provider]  propranolol (INDERAL) 20 MG tablet Take 1 tablet (20 mg total) by mouth 2 (two) times daily. 07/10/15  Yes Janece Canterbury, MD   Allergies  Allergen Reactions  . Feldene [Piroxicam]     Kidney issues  . Clinoril [Sulindac] Rash  . Morphine And Related Nausea And  Vomiting   Review of Systems  Neurological: Positive for weakness.    Physical Exam Cardiovascular:     Rate and Rhythm: Normal rate and regular rhythm.     Heart sounds: Normal heart sounds.  Pulmonary:     Effort: Pulmonary effort is normal.     Breath sounds: Normal breath sounds.  Musculoskeletal:     Comments: generlaizzed weakness and atrophy  Skin:    General: Skin is warm and dry.  Neurological:     Mental Status: She is alert.     Vital Signs: BP (!) 124/57 (BP Location: Left Arm)   Pulse (!) 50   Temp 98.1 F (36.7 C) (Oral)   Resp 18   Ht 5\' 3"  (1.6 m)   Wt 77.1 kg   SpO2 95%   BMI 30.11 kg/m  Pain Scale: 0-10 POSS *See Group Information*: 1-Acceptable,Awake and alert Pain Score: 0-No pain   SpO2: SpO2: 95 % O2 Device:SpO2: 95 % O2 Flow Rate: .O2 Flow Rate (L/min): 1 L/min  IO: Intake/output summary:   Intake/Output Summary (Last 24 hours) at 10/22/2018 1537 Last data filed at 10/22/2018 1300 Gross per 24 hour  Intake -  Output 850 ml  Net -850 ml    LBM: Last BM Date: (PTA) Baseline Weight: Weight: 77.1 kg Most recent weight: Weight: 77.1 kg      Palliative Assessment/Data: 30 % at best     Time In: 1145 Time Out: 1300 Time Total: 75 minutes Greater than 50%  of this time was spent counseling and coordinating care related to the above assessment and plan.  Signed by: Wadie Lessen, NP   Please contact Palliative Medicine Team phone at [phone] for questions and concerns.  For individual provider: See Shea Evans

## 2018-10-22 NOTE — Progress Notes (Signed)
Patient Demographics:    Dominique Mckee, is a 84 y.o. female, DOB - 11-17-1929, ZOX:[id number]  Admit date - 10/20/2018   Admitting Physician Etta Quill, DO  Outpatient Primary MD for the patient is Wenda Low, MD  LOS - 0   Chief Complaint  Patient presents with  . Nausea  . Emesis        Subjective:    Dominique Mckee today has no fevers, no emesis,  No chest pain, no further dizziness, headache is resolved, no chest pains, no palpitations, shortness of breath and hypoxia persist  Assessment  & Plan :    Principal Problem:   Hypoxia Active Problems:   Rheumatoid arthritis (HCC)   Nausea and vomiting   Chronic diastolic CHF (congestive heart failure) [facility])  Brief Summary 84 y.o. female with medical history significant of diastolic CHF, RA admitted on 10/21/2018 with acute hypoxia and found to have acute bilateral DVT and PE   Plan 1)Acute bilateral DVT and PE-----CTA chest and venous Dopplers report noted, echo with preserved EF of 65%, no intracardiac thrombus, no pulmonary hypertension, continue Eliquis for full anticoagulation, shortness of breath and hypoxia persist  2)PAFib--stable, TSH is 0.78 ,continue propranolol 20 mg p.o. twice daily for rate control, patient already on Eliquis for PE  3)RA--- stable, patient with severe debilitating rheumatoid arthritis, continue PRN oxycodone, continue methotrexate 25 mg every Thursday, continue folic acid  4)HFpEF--- echo this admission with EF of 60 to 65%, clinically appears euvolemic and compensated, BNP  is 136 continue Lasix 20 mg daily  5)HAs/Neck Pain-CTA head and neck without acute findings, CT of C-spine without acute findings, headache and neck pain as well as dizziness has resolved at this time  6)Recurrent hypokalemia--- replace, recheck potassium and magnesium, most likely due to Lasix use, patient will need daily potassium  supplementation while on Lasix  7)Acute hypoxic respiratory failure--- secondary to PE as above #1, if unable to wean off O2 will discharge home on O2  8)Generalized weakness/debility--- PT recommended SNF, patient and family/son will probably want to go home with home health services, patient has significant difficulties with mobility related ADLs3  Disposition/Need for in-Hospital Stay- patient unable to be discharged at this time due to persistent hypoxia and dyspnea  Code Status : Full  Family Communication:   None at bedside   Disposition Plan  : SNF Vs HH   DVT Prophylaxis  : Eliquis Lab Results  Component Value Date   PLT 220 10/20/2018    Inpatient Medications  Scheduled Meds: . apixaban  10 mg Oral BID  . calcium carbonate  1 tablet Oral BID WC  . DULoxetine  30 mg Oral Daily  . ferrous sulfate  325 mg Oral QPM  . folic acid  1 mg Oral Daily  . furosemide  20 mg Oral Daily  . [START ON 10/23/2018] methotrexate  25 mg Oral Weekly  . pantoprazole  40 mg Oral Daily  . potassium chloride  40 mEq Oral Once  . potassium chloride  40 mEq Oral Once  . propranolol  20 mg Oral BID   Continuous Infusions: PRN Meds:.acetaminophen **OR** acetaminophen, ondansetron **OR** ondansetron (ZOFRAN) IV, oxyCODONE    Anti-infectives (From admission, onward)   None  Objective:   Vitals:   10/21/18 2103 10/22/18 0447 10/22/18 0512 10/22/18 1213  BP: 140/62 (!) 177/63 (!) 146/69 (!) 124/57  Pulse: (!) 57 (!) 50 (!) 48 (!) 50  Resp: 17 18    Temp: 98.5 F (36.9 C) 97.6 F (36.4 C)  98.1 F (36.7 C)  TempSrc: Oral Oral  Oral  SpO2: 99% 100%  95%  Weight:      Height:        Wt Readings from Last 3 Encounters:  10/20/18 77.1 kg  08/23/18 73.5 kg  09/13/17 62.1 kg     Intake/Output Summary (Last 24 hours) at 10/22/2018 1515 Last data filed at 10/22/2018 1300 Gross per 24 hour  Intake -  Output 850 ml  Net -850 ml     Physical Exam Patient is examined  daily including today on 10/22/18 , exams remain the same as of yesterday except that has changed   Gen:- Awake Alert,  In no apparent distress  HEENT:- [place].AT, No sclera icterus Neck-Supple Neck,No JVD,.  Nose- [place] 2 L/min Lungs-diminished in bases, no significant wheezing CV- S1, S2 normal, regular (history of A. fib but currently regular) Abd-  +ve B.Sounds, Abd Soft, No tenderness,    Extremity/Skin:- trace  edema, pedal pulses present  Psych-affect is appropriate, oriented x3 Neuro-generalized weakness, but no new focal deficits, no tremors   Data Review:   Micro Results No results found for this or any previous visit (from the past 240 hour(s)).  Radiology Reports Ct Angio Head W Or Wo Contrast  Result Date: 10/20/2018 CLINICAL DATA:  Altered mental status. Weakness and dizziness. Headache with nausea and vomiting. EXAM: CT ANGIOGRAPHY HEAD AND NECK TECHNIQUE: Multidetector CT imaging of the head and neck was performed using the standard protocol during bolus administration of intravenous contrast. Multiplanar CT image reconstructions and MIPs were obtained to evaluate the vascular anatomy. Carotid stenosis measurements (when applicable) are obtained utilizing NASCET criteria, using the distal internal carotid diameter as the denominator. CONTRAST:  73mL ISOVUE-370 IOPAMIDOL (ISOVUE-370) INJECTION 76% COMPARISON:  08/19/2017 FINDINGS: CT HEAD FINDINGS Brain: There is no mass, hemorrhage or extra-axial collection. There is generalized atrophy without lobar predilection. There is hypoattenuation of the periventricular white matter, most commonly indicating chronic ischemic microangiopathy. Skull: The visualized skull base, calvarium and extracranial soft tissues are normal. Sinuses/Orbits: No fluid levels or advanced mucosal thickening of the visualized paranasal sinuses. No mastoid or middle ear effusion. The orbits are normal. CTA NECK FINDINGS SKELETON: There is no bony spinal canal  stenosis. No lytic or blastic lesion. OTHER NECK: Normal pharynx, larynx and major salivary glands. No cervical lymphadenopathy. Unremarkable thyroid gland. UPPER CHEST: No pneumothorax or pleural effusion. No nodules or masses. AORTIC ARCH: There is mild calcific atherosclerosis of the aortic arch. There is no aneurysm, dissection or hemodynamically significant stenosis of the visualized ascending aorta and aortic arch. Conventional 3 vessel aortic branching pattern. The visualized proximal subclavian arteries are widely patent. RIGHT CAROTID SYSTEM: --Common carotid artery: Widely patent origin without common carotid artery dissection or aneurysm. --Internal carotid artery: No dissection, occlusion or aneurysm. Mild atherosclerotic calcification at the carotid bifurcation without hemodynamically significant stenosis. --External carotid artery: No acute abnormality. LEFT CAROTID SYSTEM: --Common carotid artery: Widely patent origin without common carotid artery dissection or aneurysm. --Internal carotid artery: No dissection, occlusion or aneurysm. There is predominantly calcified atherosclerosis extending into the proximal ICA, resulting in less than 50% stenosis. --External carotid artery: No acute abnormality. VERTEBRAL ARTERIES: Codominant configuration. Both  origins are normal. No dissection, occlusion or flow-limiting stenosis to the vertebrobasilar confluence. CTA HEAD FINDINGS ANTERIOR CIRCULATION: --Intracranial internal carotid arteries: Atherosclerotic calcification of the internal carotid arteries at the skull base without hemodynamically significant stenosis. --Anterior cerebral arteries: Normal. Both A1 segments are present. Patent anterior communicating artery. --Middle cerebral arteries: Normal. --Posterior communicating arteries: Present on the left, absent on the right. POSTERIOR CIRCULATION: --Basilar artery: Normal. --Posterior cerebral arteries: Normal right. Fetal origin of the left. Mild  stenosis of the proximal left P2 segment. --Superior cerebellar arteries: Normal. --Inferior cerebellar arteries: Normal anterior and posterior inferior cerebellar arteries. VENOUS SINUSES: As permitted by contrast timing, patent. ANATOMIC VARIANTS: Fetal origin of the left posterior cerebral artery. This is a normal anatomic variant. DELAYED PHASE: No parenchymal contrast enhancement. Review of the MIP images confirms the above findings. IMPRESSION: 1. No emergent large vessel occlusion or high-grade stenosis. 2. Bilateral carotid bifurcation atherosclerosis without hemodynamically significant stenosis. 3. Mild atrophy and chronic microvascular disease. Aortic atherosclerosis (ICD10-I70.0). Electronically Signed   By: Ulyses Jarred M.D.   On: 10/20/2018 17:58   Dg Chest 2 View  Result Date: 10/20/2018 CLINICAL DATA:  Generalized weakness, hypoxia, chills EXAM: CHEST - 2 VIEW COMPARISON:  10/24/2017 FINDINGS: Cardiomegaly. Large hiatal hernia. No confluent airspace opacities or effusions. No acute bony abnormality. IMPRESSION: Cardiomegaly, hiatal hernia.  No active disease. Electronically Signed   By: Rolm Baptise M.D.   On: 10/20/2018 16:31   Ct Angio Neck W And/or Wo Contrast  Result Date: 10/20/2018 CLINICAL DATA:  Altered mental status. Weakness and dizziness. Headache with nausea and vomiting. EXAM: CT ANGIOGRAPHY HEAD AND NECK TECHNIQUE: Multidetector CT imaging of the head and neck was performed using the standard protocol during bolus administration of intravenous contrast. Multiplanar CT image reconstructions and MIPs were obtained to evaluate the vascular anatomy. Carotid stenosis measurements (when applicable) are obtained utilizing NASCET criteria, using the distal internal carotid diameter as the denominator. CONTRAST:  68mL ISOVUE-370 IOPAMIDOL (ISOVUE-370) INJECTION 76% COMPARISON:  08/19/2017 FINDINGS: CT HEAD FINDINGS Brain: There is no mass, hemorrhage or extra-axial collection. There is  generalized atrophy without lobar predilection. There is hypoattenuation of the periventricular white matter, most commonly indicating chronic ischemic microangiopathy. Skull: The visualized skull base, calvarium and extracranial soft tissues are normal. Sinuses/Orbits: No fluid levels or advanced mucosal thickening of the visualized paranasal sinuses. No mastoid or middle ear effusion. The orbits are normal. CTA NECK FINDINGS SKELETON: There is no bony spinal canal stenosis. No lytic or blastic lesion. OTHER NECK: Normal pharynx, larynx and major salivary glands. No cervical lymphadenopathy. Unremarkable thyroid gland. UPPER CHEST: No pneumothorax or pleural effusion. No nodules or masses. AORTIC ARCH: There is mild calcific atherosclerosis of the aortic arch. There is no aneurysm, dissection or hemodynamically significant stenosis of the visualized ascending aorta and aortic arch. Conventional 3 vessel aortic branching pattern. The visualized proximal subclavian arteries are widely patent. RIGHT CAROTID SYSTEM: --Common carotid artery: Widely patent origin without common carotid artery dissection or aneurysm. --Internal carotid artery: No dissection, occlusion or aneurysm. Mild atherosclerotic calcification at the carotid bifurcation without hemodynamically significant stenosis. --External carotid artery: No acute abnormality. LEFT CAROTID SYSTEM: --Common carotid artery: Widely patent origin without common carotid artery dissection or aneurysm. --Internal carotid artery: No dissection, occlusion or aneurysm. There is predominantly calcified atherosclerosis extending into the proximal ICA, resulting in less than 50% stenosis. --External carotid artery: No acute abnormality. VERTEBRAL ARTERIES: Codominant configuration. Both origins are normal. No dissection, occlusion or flow-limiting stenosis to  the vertebrobasilar confluence. CTA HEAD FINDINGS ANTERIOR CIRCULATION: --Intracranial internal carotid arteries:  Atherosclerotic calcification of the internal carotid arteries at the skull base without hemodynamically significant stenosis. --Anterior cerebral arteries: Normal. Both A1 segments are present. Patent anterior communicating artery. --Middle cerebral arteries: Normal. --Posterior communicating arteries: Present on the left, absent on the right. POSTERIOR CIRCULATION: --Basilar artery: Normal. --Posterior cerebral arteries: Normal right. Fetal origin of the left. Mild stenosis of the proximal left P2 segment. --Superior cerebellar arteries: Normal. --Inferior cerebellar arteries: Normal anterior and posterior inferior cerebellar arteries. VENOUS SINUSES: As permitted by contrast timing, patent. ANATOMIC VARIANTS: Fetal origin of the left posterior cerebral artery. This is a normal anatomic variant. DELAYED PHASE: No parenchymal contrast enhancement. Review of the MIP images confirms the above findings. IMPRESSION: 1. No emergent large vessel occlusion or high-grade stenosis. 2. Bilateral carotid bifurcation atherosclerosis without hemodynamically significant stenosis. 3. Mild atrophy and chronic microvascular disease. Aortic atherosclerosis (ICD10-I70.0). Electronically Signed   By: Ulyses Jarred M.D.   On: 10/20/2018 17:58   Ct Angio Chest Pe W Or Wo Contrast  Addendum Date: 10/21/2018   ADDENDUM REPORT: 10/21/2018 13:32 ADDENDUM: Critical Value/emergent results were called by telephone at the time of interpretation on 10/21/2018 at 1:31 pm to Dr. Skipper Cliche , who verbally acknowledged these results. Electronically Signed   By: Misty Stanley M.D.   On: 10/21/2018 13:32   Result Date: 10/21/2018 CLINICAL DATA:  Weakness and dizziness. EXAM: CT ANGIOGRAPHY CHEST WITH CONTRAST TECHNIQUE: Multidetector CT imaging of the chest was performed using the standard protocol during bolus administration of intravenous contrast. Multiplanar CT image reconstructions and MIPs were obtained to evaluate the vascular  anatomy. CONTRAST:  54mL ISOVUE-370 IOPAMIDOL (ISOVUE-370) INJECTION 76% COMPARISON:  None. FINDINGS: Cardiovascular: Heart is enlarged. Coronary artery calcification is evident. Atherosclerotic calcification is noted in the wall of the thoracic aorta. Acute pulmonary embolus is identified in segmental branches to the left upper lobe (159/7 and sagittal image 78/9). No additional filling defects are identified in the pulmonary arteries to suggest additional pulmonary embolus. Mediastinum/Nodes: No mediastinal lymphadenopathy. There is no hilar lymphadenopathy. Moderate to large hiatal hernia. There is no axillary lymphadenopathy. Lungs/Pleura: Areas of interstitial and patchy ground-glass attenuation are seen in the lungs bilaterally. With volume loss in the lower lobes. No overt pulmonary edema. No substantial pleural effusion. Upper Abdomen: Unremarkable. Musculoskeletal: No worrisome lytic or sclerotic osseous abnormality. Review of the MIP images confirms the above findings. IMPRESSION: 1. Small acute pulmonary embolus involving a segmental branch to the left upper lobe. No other pulmonary embolic disease evident. 2. Patchy ground-glass attenuation bilaterally. Air trapping would be a consideration. 3. Areas of volume loss/atelectasis in the lower lungs bilaterally. 4.  Aortic Atherosclerois (ICD10-170.0) Electronically Signed: By: Misty Stanley M.D. On: 10/21/2018 13:25   Ct C-spine No Charge  Result Date: 10/20/2018 CLINICAL DATA:  84 year old female with altered mental status, neck pain, generalized weakness, dizziness. EXAM: CT CERVICAL SPINE WITH CONTRAST TECHNIQUE: Multiplanar CT images of the cervical spine were reconstructed from contemporary CTA of the Neck. CONTRAST:  No additional COMPARISON:  CTA head and neck today reported separately. Cervical spine CT 08/19/2017. FINDINGS: Alignment: Stable since 2018. Straightening and mild reversal of cervical lordosis with mild degenerative appearing  anterolisthesis of C3 on C4 and C7 on T1. Bilateral posterior element alignment is within normal limits. Skull base and vertebrae: Visualized skull base is intact. No atlanto-occipital dissociation. No acute osseous abnormality identified in the cervical spine. Soft tissues and spinal canal:  No prevertebral fluid or swelling. No visible canal hematoma. Arterial and other neck soft tissue findings are reported on the CTA today separately. Disc levels: Progressed since 2018 and severe right side C1-C2 joint space loss. Advanced disc and endplate degeneration except at C2-C3 and C7-T1. Advanced facet degeneration throughout the left cervical spine. Possible developing posterior element ankylosis on the left at C3-C4, while ankylosis has developed since 2018 on the left at C4-C5. Mild chronic spinal stenosis suspected C3-C4 through C5-C6. Upper chest: Visible upper thoracic levels appear grossly intact. Partially visible advanced right shoulder degeneration. Mild nonspecific upper lung mosaic attenuation, probably gas trapping. Other upper chest findings reported separately on the CTA today. Other: Severe TMJ degeneration greater on the right. IMPRESSION: 1. No acute osseous abnormality in the cervical spine. 2. Advanced cervical spine degeneration with progression since 2018. - severe right C1-C2 joint space loss. - degenerative ankylosis is developing at the left posterior elements of C3 through C5. - multilevel mild spinal stenosis. 3. CTA head and neck today are reported separately. Electronically Signed   By: Genevie Ann M.D.   On: 10/20/2018 17:57   Vas [place] Lower Extremity Venous (dvt)  Result Date: 10/22/2018  Lower Venous Study Indications: History of DVT, and Pain.  Limitations: Body habitus and Pain. Performing Technologist: Rudell Cobb  Examination Guidelines: A complete evaluation includes B-mode imaging, spectral Doppler, color Doppler, and power Doppler as needed of all accessible portions of each vessel.  Bilateral testing is considered an integral part of a complete examination. Limited examinations for reoccurring indications may be performed as noted.  Right Venous Findings: +---------+---------------+---------+-----------+----------------------+-------+          CompressibilityPhasicitySpontaneityProperties            Summary +---------+---------------+---------+-----------+----------------------+-------+ CFV      None                    Yes        partially             Acute                                               re-cannalized                 +---------+---------------+---------+-----------+----------------------+-------+ SFJ      None                                                     Acute   +---------+---------------+---------+-----------+----------------------+-------+ FV Prox  None                                                     Acute   +---------+---------------+---------+-----------+----------------------+-------+ FV Mid   Full                                                             +---------+---------------+---------+-----------+----------------------+-------+  FV DistalPartial                                                  Acute   +---------+---------------+---------+-----------+----------------------+-------+ PFV      Partial                                                  Acute   +---------+---------------+---------+-----------+----------------------+-------+ POP      Full           Yes      Yes                                      +---------+---------------+---------+-----------+----------------------+-------+ PTV      Partial                 No                               Acute   +---------+---------------+---------+-----------+----------------------+-------+ PERO     Partial                 No                               Acute    +---------+---------------+---------+-----------+----------------------+-------+ Right external iliac vein: heterogenous material in the lumen of vessel with doppler demonstrates patency, IVC patency examed with limited visibility.  Left Venous Findings: +---------+---------------+---------+-----------+---------------+--------------+          CompressibilityPhasicitySpontaneityProperties     Summary        +---------+---------------+---------+-----------+---------------+--------------+ CFV      Full           Yes      Yes                                      +---------+---------------+---------+-----------+---------------+--------------+ SFJ      Full                                                             +---------+---------------+---------+-----------+---------------+--------------+ FV Prox  Full                                                             +---------+---------------+---------+-----------+---------------+--------------+ FV Mid   Full                                              dual system    +---------+---------------+---------+-----------+---------------+--------------+ FV Distal  Yes                       not well                                                                  visualized                                                                with                                                                      compression    +---------+---------------+---------+-----------+---------------+--------------+ PFV      Full                                                             +---------+---------------+---------+-----------+---------------+--------------+ POP      Full           Yes      Yes                                      +---------+---------------+---------+-----------+---------------+--------------+ PTV      Partial                            partially      Acute                                                       re-cannalized                 +---------+---------------+---------+-----------+---------------+--------------+ PERO     Partial                            partially      Acute                                                      re-cannalized                 +---------+---------------+---------+-----------+---------------+--------------+    Summary: Right: Findings consistent with acute deep vein thrombosis involving the right common femoral  vein, right femoral vein, right posterior tibial vein, and right peroneal vein. Right external iliac vein examed: heterogenous material in the lumen of vessel with doppler demonstrates patency, IVC patency examed with limited visibility. No cystic structure found in the popliteal fossa. Left: Findings consistent with acute deep vein thrombosis involving the left posterior tibial vein, and left peroneal vein. No cystic structure found in the popliteal fossa.  *See table(s) above for measurements and observations. Electronically signed by Harold Barban MD on 10/22/2018 at 1:50:01 PM.    Final      CBC Recent Labs  Lab 10/20/18 1643  WBC 8.2  HGB 13.1  HCT 42.8  PLT 220  MCV 102.6*  MCH 31.4  MCHC 30.6  RDW 13.4  LYMPHSABS 1.2  MONOABS 0.2  EOSABS 0.0  BASOSABS 0.0    Chemistries  Recent Labs  Lab 10/20/18 1423 10/20/18 1520 10/22/18 0543  NA 142  --  142  K 3.6  --  2.6*  CL 102  --  99  CO2 25  --  32  GLUCOSE 142*  --  95  BUN 19  --  11  CREATININE 0.79  --  0.76  CALCIUM 8.9  --  8.5*  MG  --  1.9  --   AST 31  --   --   ALT 13  --   --   ALKPHOS 59  --   --   BILITOT 1.9*  --   --    ------------------------------------------------------------------------------------------------------------------ No results for input(s): CHOL, HDL, LDLCALC, TRIG, CHOLHDL, LDLDIRECT in the last 72 hours.  Lab Results  Component Value Date   HGBA1C 6.4 (H) 05/30/2011    ------------------------------------------------------------------------------------------------------------------ Recent Labs    10/20/18 1541  TSH 0.781   -----------------------------------------------------------------------------------------------------------------    Component Value Date/Time   BNP 136.5 (H) 10/20/2018 2956    Roxan Hockey M.D on 10/22/2018 at 3:15 PM  Pager---[phone] Go to [email or web address] - password [email or web address] for contact info  [facility] - Office  [phone]

## 2018-10-23 ENCOUNTER — Encounter (HOSPITAL_COMMUNITY): Payer: PPO

## 2018-10-23 DIAGNOSIS — R531 Weakness: Secondary | ICD-10-CM | POA: Diagnosis not present

## 2018-10-23 DIAGNOSIS — Z66 Do not resuscitate: Secondary | ICD-10-CM

## 2018-10-23 DIAGNOSIS — M542 Cervicalgia: Secondary | ICD-10-CM

## 2018-10-23 DIAGNOSIS — Z515 Encounter for palliative care: Secondary | ICD-10-CM | POA: Diagnosis not present

## 2018-10-23 DIAGNOSIS — R0902 Hypoxemia: Secondary | ICD-10-CM | POA: Diagnosis not present

## 2018-10-23 DIAGNOSIS — I5032 Chronic diastolic (congestive) heart failure: Secondary | ICD-10-CM | POA: Diagnosis not present

## 2018-10-23 DIAGNOSIS — M069 Rheumatoid arthritis, unspecified: Secondary | ICD-10-CM | POA: Diagnosis not present

## 2018-10-23 LAB — MAGNESIUM: Magnesium: 1.7 mg/dL (ref 1.7–2.4)

## 2018-10-23 LAB — BASIC METABOLIC PANEL
Anion gap: 8 (ref 5–15)
BUN: 13 mg/dL (ref 8–23)
CALCIUM: 8.2 mg/dL — AB (ref 8.9–10.3)
CO2: 32 mmol/L (ref 22–32)
Chloride: 101 mmol/L (ref 98–111)
Creatinine, Ser: 0.96 mg/dL (ref 0.44–1.00)
GFR calc Af Amer: >60 mL/min (ref >60)
GFR, EST NON AFRICAN AMERICAN: 54 mL/min — AB (ref >60)
Glucose, Bld: 107 mg/dL — ABNORMAL HIGH (ref 70–99)
Potassium: 3.8 mmol/L (ref 3.5–5.1)
Sodium: 141 mmol/L (ref 135–145)

## 2018-10-23 MED ORDER — POLYETHYLENE GLYCOL 3350 17 G PO PACK
17.0000 g | PACK | Freq: Every day | ORAL | Status: DC
  Administered 2018-10-23 – 2018-10-27 (×5): 17 g via ORAL
  Filled 2018-10-23 (×5): qty 1

## 2018-10-23 NOTE — Clinical Social Work Note (Signed)
Clinical Social Work Assessment  Patient Details  Name: Dominique Mckee MRN: [id number] Date of Birth: 1930/02/07  Date of referral:  10/23/18               Reason for consult:  Facility Placement                Permission sought to share information with:  Facility Sport and exercise psychologist, Family Supports Permission granted to share information::  Yes, Verbal Permission Granted  Name::     Environmental education officer::  SNFs  Relationship::  Son  Contact Information:  [phone]  Housing/Transportation Living arrangements for the past 2 months:  Single Family Home Source of Information:  Adult Children Patient Interpreter Needed:  None Criminal Activity/Legal Involvement Pertinent to Current Situation/Hospitalization:  No - Comment as needed Significant Relationships:  Adult Children Lives with:  Self Do you feel safe going back to the place where you live?  No Need for family participation in patient care:  Yes (Comment)  Care giving concerns:  CSW received consult for possible SNF placement at time of discharge. CSW spoke with patient's son/decision maker regarding PT recommendation of SNF placement at time of discharge. Patient's son reported that he is currently unable to care for patient at home given patient's current physical needs and fall risk. Patient's son expressed understanding of PT recommendation and is agreeable to SNF placement at time of discharge. CSW to continue to follow and assist with discharge planning needs.   Social Worker assessment / plan:  CSW spoke with patient's son concerning possibility of rehab at [facility] before returning home.  Employment status:  Retired Nurse, adult PT Recommendations:  [place], Home with [place] / Referral to community resources:  [place]  Patient/Family's Response to care:  Patient's son recognizes need for rehab before returning home and is agreeable to a SNF in  [place]. Patient's son reported preference for [facility] since patient has been there before. CSW explained insurance pre-authorization process.   Patient/Family's Understanding of and Emotional Response to Diagnosis, Current Treatment, and Prognosis:  Patient/family is realistic regarding therapy needs and expressed being hopeful for SNF placement. Patient's son expressed understanding of CSW role and discharge process as well as medical condition. No questions/concerns about plan or treatment.    Emotional Assessment Appearance:  Appears stated age Attitude/Demeanor/Rapport:  Unable to Assess Affect (typically observed):  Unable to Assess Orientation:  Oriented to Self, Oriented to Place, Oriented to Situation Alcohol / Substance use:  Not Applicable Psych involvement (Current and /or in the community):  No (Comment)  Discharge Needs  Concerns to be addressed:  Care Coordination Readmission within the last 30 days:  No Current discharge risk:  Dependent with Mobility Barriers to Discharge:  Continued Medical Work up   [employer], LCSW 10/23/2018, 4:12 PM

## 2018-10-23 NOTE — Progress Notes (Signed)
Patient ID: Dominique Mckee, female   DOB: 1930-05-03, 84 y.o.   MRN: [id number]  This NP visited patient at the bedside as a follow up to  yesterday's [place].  Patient is OOB to chair eating breakfast.  She tells me that now she is considering a SNF for short term rehab on discharge, hoping to get "alittle stronger" before going home.  Discussed with patient the importance of continued conversation with her  family and the  medical providers regarding overall plan of care and treatment options,  ensuring decisions are within the context of the patients values and GOCs.  Recommend Palliative services at facility  Questions and concerns addressed    Total time spent on the unit was 20 minutes  Greater than 50% of the time was spent in counseling and coordination of care  Wadie Lessen NP  Palliative Medicine Team Team Phone # [phone] Pager [phone]

## 2018-10-23 NOTE — Progress Notes (Signed)
Patient Demographics:    Dominique Mckee, is a 84 y.o. female, DOB - 26-Sep-1930, STM:[id number]  Admit date - 10/20/2018   Admitting Physician Etta Quill, DO  Outpatient Primary MD for the patient is Wenda Low, MD  LOS - 0   Chief Complaint  Patient presents with  . Nausea  . Emesis        Subjective:    Dominique Mckee today has no fevers, no emesis,  No chest pain, no further dizziness, headache is resolved, no chest pains, no palpitations, shortness of breath and hypoxia persist  Assessment  & Plan :    Principal Problem:   Hypoxia Active Problems:   Rheumatoid arthritis (HCC)   Nausea and vomiting   Chronic diastolic CHF (congestive heart failure) ([place])   Palliative care by specialist   DNR (do not resuscitate)   Neck pain  Brief Summary 84 y.o. female with medical history significant of diastolic CHF, RA admitted on 10/21/2018 with acute hypoxia and found to have acute bilateral DVT and PE, awaiting insurance approval for transfer to SNF rehab  Plan 1)Acute bilateral DVT and PE-----CTA chest and venous Dopplers report noted, echo with preserved EF of 65%, no intracardiac thrombus, no pulmonary hypertension, continue Eliquis for full anticoagulation, shortness of breath and hypoxia improving  2)PAFib--stable, TSH is 0.78 , continue propranolol 20 mg p.o. twice daily for rate control, patient already on Eliquis for PE  3)RA--- stable, patient with severe debilitating rheumatoid arthritis, continue PRN oxycodone, continue methotrexate 25 mg every Thursday, continue folic acid  4)HFpEF--- echo this admission with EF of 60 to 65%, clinically appears euvolemic and compensated, BNP  is 136 continue Lasix 20 mg daily  5)HAs/Neck Pain-CTA head and neck without acute findings, CT of C-spine without acute findings, headache and neck pain as well as dizziness has resolved at this  time  6)Recurrent hypokalemia--- replace, recheck potassium and magnesium, most likely due to Lasix use, patient will need daily potassium supplementation while on Lasix  7)Acute hypoxic respiratory failure--- secondary to PE as above #1, if unable to wean off O2 will discharge home on O2  8)Generalized weakness/debility--- PT recommended SNF, patient and family/son are now agreeable to go to SNF rehab, patient has significant difficulties with mobility related ADLs  Disposition/Need for in-Hospital Stay- patient unable to be discharged at this time due to hypoxia and dyspnea, also awaiting insurance approval for transfer to SNF rehab  Code Status : Full  Family Communication:   None at bedside   Disposition Plan  : SNF  Rehab  DVT Prophylaxis  : Eliquis Lab Results  Component Value Date   PLT 220 10/20/2018    Inpatient Medications  Scheduled Meds: . apixaban  10 mg Oral BID  . calcium carbonate  1 tablet Oral BID WC  . DULoxetine  30 mg Oral Daily  . ferrous sulfate  325 mg Oral QPM  . folic acid  1 mg Oral Daily  . furosemide  20 mg Oral Daily  . methotrexate  25 mg Oral Weekly  . pantoprazole  40 mg Oral Daily  . polyethylene glycol  17 g Oral Daily  . propranolol  20 mg Oral BID   Continuous Infusions: PRN Meds:.acetaminophen **OR** acetaminophen, ondansetron **OR**  ondansetron (ZOFRAN) IV, oxyCODONE    Anti-infectives (From admission, onward)   None        Objective:   Vitals:   10/22/18 2045 10/23/18 0434 10/23/18 0824 10/23/18 1342  BP: (!) 139/55 (!) 142/51  121/64  Pulse: (!) 56 (!) 53 (!) 55 65  Resp: 17 16  18   Temp: 98.8 F (37.1 C) 98.6 F (37 C)  98 F (36.7 C)  TempSrc: Oral Oral    SpO2: 91% 93%  92%  Weight:      Height:        Wt Readings from Last 3 Encounters:  10/20/18 77.1 kg  08/23/18 73.5 kg  09/13/17 62.1 kg     Intake/Output Summary (Last 24 hours) at 10/23/2018 1750 Last data filed at 10/23/2018 1255 Gross per 24  hour  Intake 500 ml  Output 400 ml  Net 100 ml     Physical Exam Patient is examined daily including today on 10/23/18 , exams remain the same as of yesterday except that has changed   Gen:- Awake Alert,  In no apparent distress  HEENT:- [place].AT, No sclera icterus Neck-Supple Neck,No JVD,.  Nose- [place] 2 L/min Lungs-diminished in bases, no significant wheezing CV- S1, S2 normal, regular (history of A. fib but currently regular) Abd-  +ve B.Sounds, Abd Soft, No tenderness,    Extremity/Skin:- trace  edema, pedal pulses present  Psych-affect is appropriate, oriented x3 Neuro-generalized weakness, but no new focal deficits, no tremors   Data Review:   Micro Results No results found for this or any previous visit (from the past 240 hour(s)).  Radiology Reports Ct Angio Head W Or Wo Contrast  Result Date: 10/20/2018 CLINICAL DATA:  Altered mental status. Weakness and dizziness. Headache with nausea and vomiting. EXAM: CT ANGIOGRAPHY HEAD AND NECK TECHNIQUE: Multidetector CT imaging of the head and neck was performed using the standard protocol during bolus administration of intravenous contrast. Multiplanar CT image reconstructions and MIPs were obtained to evaluate the vascular anatomy. Carotid stenosis measurements (when applicable) are obtained utilizing NASCET criteria, using the distal internal carotid diameter as the denominator. CONTRAST:  38mL ISOVUE-370 IOPAMIDOL (ISOVUE-370) INJECTION 76% COMPARISON:  08/19/2017 FINDINGS: CT HEAD FINDINGS Brain: There is no mass, hemorrhage or extra-axial collection. There is generalized atrophy without lobar predilection. There is hypoattenuation of the periventricular white matter, most commonly indicating chronic ischemic microangiopathy. Skull: The visualized skull base, calvarium and extracranial soft tissues are normal. Sinuses/Orbits: No fluid levels or advanced mucosal thickening of the visualized paranasal sinuses. No mastoid or middle ear  effusion. The orbits are normal. CTA NECK FINDINGS SKELETON: There is no bony spinal canal stenosis. No lytic or blastic lesion. OTHER NECK: Normal pharynx, larynx and major salivary glands. No cervical lymphadenopathy. Unremarkable thyroid gland. UPPER CHEST: No pneumothorax or pleural effusion. No nodules or masses. AORTIC ARCH: There is mild calcific atherosclerosis of the aortic arch. There is no aneurysm, dissection or hemodynamically significant stenosis of the visualized ascending aorta and aortic arch. Conventional 3 vessel aortic branching pattern. The visualized proximal subclavian arteries are widely patent. RIGHT CAROTID SYSTEM: --Common carotid artery: Widely patent origin without common carotid artery dissection or aneurysm. --Internal carotid artery: No dissection, occlusion or aneurysm. Mild atherosclerotic calcification at the carotid bifurcation without hemodynamically significant stenosis. --External carotid artery: No acute abnormality. LEFT CAROTID SYSTEM: --Common carotid artery: Widely patent origin without common carotid artery dissection or aneurysm. --Internal carotid artery: No dissection, occlusion or aneurysm. There is predominantly calcified atherosclerosis extending into  the proximal ICA, resulting in less than 50% stenosis. --External carotid artery: No acute abnormality. VERTEBRAL ARTERIES: Codominant configuration. Both origins are normal. No dissection, occlusion or flow-limiting stenosis to the vertebrobasilar confluence. CTA HEAD FINDINGS ANTERIOR CIRCULATION: --Intracranial internal carotid arteries: Atherosclerotic calcification of the internal carotid arteries at the skull base without hemodynamically significant stenosis. --Anterior cerebral arteries: Normal. Both A1 segments are present. Patent anterior communicating artery. --Middle cerebral arteries: Normal. --Posterior communicating arteries: Present on the left, absent on the right. POSTERIOR CIRCULATION: --Basilar  artery: Normal. --Posterior cerebral arteries: Normal right. Fetal origin of the left. Mild stenosis of the proximal left P2 segment. --Superior cerebellar arteries: Normal. --Inferior cerebellar arteries: Normal anterior and posterior inferior cerebellar arteries. VENOUS SINUSES: As permitted by contrast timing, patent. ANATOMIC VARIANTS: Fetal origin of the left posterior cerebral artery. This is a normal anatomic variant. DELAYED PHASE: No parenchymal contrast enhancement. Review of the MIP images confirms the above findings. IMPRESSION: 1. No emergent large vessel occlusion or high-grade stenosis. 2. Bilateral carotid bifurcation atherosclerosis without hemodynamically significant stenosis. 3. Mild atrophy and chronic microvascular disease. Aortic atherosclerosis (ICD10-I70.0). Electronically Signed   By: Ulyses Jarred M.D.   On: 10/20/2018 17:58   Dg Chest 2 View  Result Date: 10/20/2018 CLINICAL DATA:  Generalized weakness, hypoxia, chills EXAM: CHEST - 2 VIEW COMPARISON:  10/24/2017 FINDINGS: Cardiomegaly. Large hiatal hernia. No confluent airspace opacities or effusions. No acute bony abnormality. IMPRESSION: Cardiomegaly, hiatal hernia.  No active disease. Electronically Signed   By: Rolm Baptise M.D.   On: 10/20/2018 16:31   Ct Angio Neck W And/or Wo Contrast  Result Date: 10/20/2018 CLINICAL DATA:  Altered mental status. Weakness and dizziness. Headache with nausea and vomiting. EXAM: CT ANGIOGRAPHY HEAD AND NECK TECHNIQUE: Multidetector CT imaging of the head and neck was performed using the standard protocol during bolus administration of intravenous contrast. Multiplanar CT image reconstructions and MIPs were obtained to evaluate the vascular anatomy. Carotid stenosis measurements (when applicable) are obtained utilizing NASCET criteria, using the distal internal carotid diameter as the denominator. CONTRAST:  62mL ISOVUE-370 IOPAMIDOL (ISOVUE-370) INJECTION 76% COMPARISON:  08/19/2017  FINDINGS: CT HEAD FINDINGS Brain: There is no mass, hemorrhage or extra-axial collection. There is generalized atrophy without lobar predilection. There is hypoattenuation of the periventricular white matter, most commonly indicating chronic ischemic microangiopathy. Skull: The visualized skull base, calvarium and extracranial soft tissues are normal. Sinuses/Orbits: No fluid levels or advanced mucosal thickening of the visualized paranasal sinuses. No mastoid or middle ear effusion. The orbits are normal. CTA NECK FINDINGS SKELETON: There is no bony spinal canal stenosis. No lytic or blastic lesion. OTHER NECK: Normal pharynx, larynx and major salivary glands. No cervical lymphadenopathy. Unremarkable thyroid gland. UPPER CHEST: No pneumothorax or pleural effusion. No nodules or masses. AORTIC ARCH: There is mild calcific atherosclerosis of the aortic arch. There is no aneurysm, dissection or hemodynamically significant stenosis of the visualized ascending aorta and aortic arch. Conventional 3 vessel aortic branching pattern. The visualized proximal subclavian arteries are widely patent. RIGHT CAROTID SYSTEM: --Common carotid artery: Widely patent origin without common carotid artery dissection or aneurysm. --Internal carotid artery: No dissection, occlusion or aneurysm. Mild atherosclerotic calcification at the carotid bifurcation without hemodynamically significant stenosis. --External carotid artery: No acute abnormality. LEFT CAROTID SYSTEM: --Common carotid artery: Widely patent origin without common carotid artery dissection or aneurysm. --Internal carotid artery: No dissection, occlusion or aneurysm. There is predominantly calcified atherosclerosis extending into the proximal ICA, resulting in less than 50% stenosis. --External  carotid artery: No acute abnormality. VERTEBRAL ARTERIES: Codominant configuration. Both origins are normal. No dissection, occlusion or flow-limiting stenosis to the vertebrobasilar  confluence. CTA HEAD FINDINGS ANTERIOR CIRCULATION: --Intracranial internal carotid arteries: Atherosclerotic calcification of the internal carotid arteries at the skull base without hemodynamically significant stenosis. --Anterior cerebral arteries: Normal. Both A1 segments are present. Patent anterior communicating artery. --Middle cerebral arteries: Normal. --Posterior communicating arteries: Present on the left, absent on the right. POSTERIOR CIRCULATION: --Basilar artery: Normal. --Posterior cerebral arteries: Normal right. Fetal origin of the left. Mild stenosis of the proximal left P2 segment. --Superior cerebellar arteries: Normal. --Inferior cerebellar arteries: Normal anterior and posterior inferior cerebellar arteries. VENOUS SINUSES: As permitted by contrast timing, patent. ANATOMIC VARIANTS: Fetal origin of the left posterior cerebral artery. This is a normal anatomic variant. DELAYED PHASE: No parenchymal contrast enhancement. Review of the MIP images confirms the above findings. IMPRESSION: 1. No emergent large vessel occlusion or high-grade stenosis. 2. Bilateral carotid bifurcation atherosclerosis without hemodynamically significant stenosis. 3. Mild atrophy and chronic microvascular disease. Aortic atherosclerosis (ICD10-I70.0). Electronically Signed   By: Ulyses Jarred M.D.   On: 10/20/2018 17:58   Ct Angio Chest Pe W Or Wo Contrast  Addendum Date: 10/21/2018   ADDENDUM REPORT: 10/21/2018 13:32 ADDENDUM: Critical Value/emergent results were called by telephone at the time of interpretation on 10/21/2018 at 1:31 pm to Dr. Skipper Cliche , who verbally acknowledged these results. Electronically Signed   By: Misty Stanley M.D.   On: 10/21/2018 13:32   Result Date: 10/21/2018 CLINICAL DATA:  Weakness and dizziness. EXAM: CT ANGIOGRAPHY CHEST WITH CONTRAST TECHNIQUE: Multidetector CT imaging of the chest was performed using the standard protocol during bolus administration of intravenous  contrast. Multiplanar CT image reconstructions and MIPs were obtained to evaluate the vascular anatomy. CONTRAST:  72mL ISOVUE-370 IOPAMIDOL (ISOVUE-370) INJECTION 76% COMPARISON:  None. FINDINGS: Cardiovascular: Heart is enlarged. Coronary artery calcification is evident. Atherosclerotic calcification is noted in the wall of the thoracic aorta. Acute pulmonary embolus is identified in segmental branches to the left upper lobe (159/7 and sagittal image 78/9). No additional filling defects are identified in the pulmonary arteries to suggest additional pulmonary embolus. Mediastinum/Nodes: No mediastinal lymphadenopathy. There is no hilar lymphadenopathy. Moderate to large hiatal hernia. There is no axillary lymphadenopathy. Lungs/Pleura: Areas of interstitial and patchy ground-glass attenuation are seen in the lungs bilaterally. With volume loss in the lower lobes. No overt pulmonary edema. No substantial pleural effusion. Upper Abdomen: Unremarkable. Musculoskeletal: No worrisome lytic or sclerotic osseous abnormality. Review of the MIP images confirms the above findings. IMPRESSION: 1. Small acute pulmonary embolus involving a segmental branch to the left upper lobe. No other pulmonary embolic disease evident. 2. Patchy ground-glass attenuation bilaterally. Air trapping would be a consideration. 3. Areas of volume loss/atelectasis in the lower lungs bilaterally. 4.  Aortic Atherosclerois (ICD10-170.0) Electronically Signed: By: Misty Stanley M.D. On: 10/21/2018 13:25   Ct C-spine No Charge  Result Date: 10/20/2018 CLINICAL DATA:  84 year old female with altered mental status, neck pain, generalized weakness, dizziness. EXAM: CT CERVICAL SPINE WITH CONTRAST TECHNIQUE: Multiplanar CT images of the cervical spine were reconstructed from contemporary CTA of the Neck. CONTRAST:  No additional COMPARISON:  CTA head and neck today reported separately. Cervical spine CT 08/19/2017. FINDINGS: Alignment: Stable since  2018. Straightening and mild reversal of cervical lordosis with mild degenerative appearing anterolisthesis of C3 on C4 and C7 on T1. Bilateral posterior element alignment is within normal limits. Skull base and vertebrae: Visualized skull  base is intact. No atlanto-occipital dissociation. No acute osseous abnormality identified in the cervical spine. Soft tissues and spinal canal: No prevertebral fluid or swelling. No visible canal hematoma. Arterial and other neck soft tissue findings are reported on the CTA today separately. Disc levels: Progressed since 2018 and severe right side C1-C2 joint space loss. Advanced disc and endplate degeneration except at C2-C3 and C7-T1. Advanced facet degeneration throughout the left cervical spine. Possible developing posterior element ankylosis on the left at C3-C4, while ankylosis has developed since 2018 on the left at C4-C5. Mild chronic spinal stenosis suspected C3-C4 through C5-C6. Upper chest: Visible upper thoracic levels appear grossly intact. Partially visible advanced right shoulder degeneration. Mild nonspecific upper lung mosaic attenuation, probably gas trapping. Other upper chest findings reported separately on the CTA today. Other: Severe TMJ degeneration greater on the right. IMPRESSION: 1. No acute osseous abnormality in the cervical spine. 2. Advanced cervical spine degeneration with progression since 2018. - severe right C1-C2 joint space loss. - degenerative ankylosis is developing at the left posterior elements of C3 through C5. - multilevel mild spinal stenosis. 3. CTA head and neck today are reported separately. Electronically Signed   By: Genevie Ann M.D.   On: 10/20/2018 17:57   Vas [place] Lower Extremity Venous (dvt)  Result Date: 10/22/2018  Lower Venous Study Indications: History of DVT, and Pain.  Limitations: Body habitus and Pain. Performing Technologist: Rudell Cobb  Examination Guidelines: A complete evaluation includes B-mode imaging, spectral  Doppler, color Doppler, and power Doppler as needed of all accessible portions of each vessel. Bilateral testing is considered an integral part of a complete examination. Limited examinations for reoccurring indications may be performed as noted.  Right Venous Findings: +---------+---------------+---------+-----------+----------------------+-------+          CompressibilityPhasicitySpontaneityProperties            Summary +---------+---------------+---------+-----------+----------------------+-------+ CFV      None                    Yes        partially             Acute                                               re-cannalized                 +---------+---------------+---------+-----------+----------------------+-------+ SFJ      None                                                     Acute   +---------+---------------+---------+-----------+----------------------+-------+ FV Prox  None                                                     Acute   +---------+---------------+---------+-----------+----------------------+-------+ FV Mid   Full                                                             +---------+---------------+---------+-----------+----------------------+-------+  FV DistalPartial                                                  Acute   +---------+---------------+---------+-----------+----------------------+-------+ PFV      Partial                                                  Acute   +---------+---------------+---------+-----------+----------------------+-------+ POP      Full           Yes      Yes                                      +---------+---------------+---------+-----------+----------------------+-------+ PTV      Partial                 No                               Acute   +---------+---------------+---------+-----------+----------------------+-------+ PERO     Partial                 No                                Acute   +---------+---------------+---------+-----------+----------------------+-------+ Right external iliac vein: heterogenous material in the lumen of vessel with doppler demonstrates patency, IVC patency examed with limited visibility.  Left Venous Findings: +---------+---------------+---------+-----------+---------------+--------------+          CompressibilityPhasicitySpontaneityProperties     Summary        +---------+---------------+---------+-----------+---------------+--------------+ CFV      Full           Yes      Yes                                      +---------+---------------+---------+-----------+---------------+--------------+ SFJ      Full                                                             +---------+---------------+---------+-----------+---------------+--------------+ FV Prox  Full                                                             +---------+---------------+---------+-----------+---------------+--------------+ FV Mid   Full                                              dual system    +---------+---------------+---------+-----------+---------------+--------------+ FV Distal  Yes                       not well                                                                  visualized                                                                with                                                                      compression    +---------+---------------+---------+-----------+---------------+--------------+ PFV      Full                                                             +---------+---------------+---------+-----------+---------------+--------------+ POP      Full           Yes      Yes                                      +---------+---------------+---------+-----------+---------------+--------------+ PTV      Partial                             partially      Acute                                                      re-cannalized                 +---------+---------------+---------+-----------+---------------+--------------+ PERO     Partial                            partially      Acute                                                      re-cannalized                 +---------+---------------+---------+-----------+---------------+--------------+    Summary: Right: Findings consistent with acute deep vein thrombosis involving the right common femoral  vein, right femoral vein, right posterior tibial vein, and right peroneal vein. Right external iliac vein examed: heterogenous material in the lumen of vessel with doppler demonstrates patency, IVC patency examed with limited visibility. No cystic structure found in the popliteal fossa. Left: Findings consistent with acute deep vein thrombosis involving the left posterior tibial vein, and left peroneal vein. No cystic structure found in the popliteal fossa.  *See table(s) above for measurements and observations. Electronically signed by Harold Barban MD on 10/22/2018 at 1:50:01 PM.    Final      CBC Recent Labs  Lab 10/20/18 1643  WBC 8.2  HGB 13.1  HCT 42.8  PLT 220  MCV 102.6*  MCH 31.4  MCHC 30.6  RDW 13.4  LYMPHSABS 1.2  MONOABS 0.2  EOSABS 0.0  BASOSABS 0.0    Chemistries  Recent Labs  Lab 10/20/18 1423 10/20/18 1520 10/22/18 0543 10/23/18 0409  NA 142  --  142 141  K 3.6  --  2.6* 3.8  CL 102  --  99 101  CO2 25  --  32 32  GLUCOSE 142*  --  95 107*  BUN 19  --  11 13  CREATININE 0.79  --  0.76 0.96  CALCIUM 8.9  --  8.5* 8.2*  MG  --  1.9  --  1.7  AST 31  --   --   --   ALT 13  --   --   --   ALKPHOS 59  --   --   --   BILITOT 1.9*  --   --   --    ------------------------------------------------------------------------------------------------------------------ No results for input(s): CHOL, HDL, LDLCALC, TRIG, CHOLHDL, LDLDIRECT in  the last 72 hours.  Lab Results  Component Value Date   HGBA1C 6.4 (H) 05/30/2011  ----------------------------------------------------------------------------------------------------------------    Component Value Date/Time   BNP 136.5 (H) 10/20/2018 7062    Roxan Hockey M.D on 10/23/2018 at 5:50 PM  Pager---[phone] Go to [email or web address] - password [email or web address] for contact info  [facility] - Office  [phone]

## 2018-10-23 NOTE — Evaluation (Addendum)
Occupational Therapy Evaluation Patient Details Name: Dominique Mckee MRN: [id number] DOB: 1930/01/11 Today's Date: 10/23/2018    History of Present Illness 84 y.o. female with medical history significant of diastolic CHF, RA.  Patient presents to ED today with c/o headache / neck pain, fatigue, N/V.  Headache and neck pain over past 4 days, R sided, severe, some dizziness.  Had N/V today.  Seemed to onset after falling asleep with "neck bent to the right" a couple of days ago per son.   Clinical Impression   This 84 y/o female presents with the above. Pt reports she uses a w/c at baseline for mobility, receives assist from her son for ADLs and for transfers to/from wheelchair. Pt presenting with generalized weakness, decreased standing balance and activity tolerance. Pt requiring modA for bed mobility and for stand pivot transfers OOB to recliner. She currently requires minA for UB ADL, maxA for LB ADLs. Pt reports possible plans for SNF rehab services at time of discharge. Given pt's level of assist (and pending level of assist pt's son is able to provide) feel this recommendation is appropriate. Will continue to follow acutely to maximize pt's safety and independence with ADLs and mobility.     Follow Up Recommendations  SNF;Supervision/Assistance - 24 hour(SNF vs 24hr)    Equipment Recommendations  None recommended by OT           Precautions / Restrictions Precautions Precautions: Fall Precaution Comments: watch o2 sats Restrictions Weight Bearing Restrictions: No      Mobility Bed Mobility Overal bed mobility: Needs Assistance Bed Mobility: Supine to Sit     Supine to sit: Mod assist;Max assist     General bed mobility comments: assist for trunk elevation and to scoot hips towards EOB  Transfers Overall transfer level: Needs assistance Equipment used: Rolling walker (2 wheeled) Transfers: Sit to/from [facility] Sit to Stand: Mod assist Stand pivot  transfers: Mod assist       General transfer comment: assist to power up and steady at [place]; steadying assist and assist to offweight hips when taking transitional steps towards recliner; pt requires significant time/effort for transfer completion     Balance Overall balance assessment: Needs assistance Sitting-balance support: Feet supported Sitting balance-Leahy Scale: Fair Sitting balance - Comments: able to sit EOB with minguard assis   Standing balance support: Bilateral upper extremity supported Standing balance-Leahy Scale: Poor                             ADL either performed or assessed with clinical judgement   ADL Overall ADL's : Needs assistance/impaired Eating/Feeding: Modified independent;Sitting Eating/Feeding Details (indicate cue type and reason): pt opening containers and setting up breakfast tray without assist at end of session Grooming: Set up;Sitting;Brushing hair   Upper Body Bathing: Min guard;Sitting   Lower Body Bathing: Moderate assistance;Sit to/from stand   Upper Body Dressing : Minimal assistance;Sitting   Lower Body Dressing: Maximal assistance;Sit to/from stand Lower Body Dressing Details (indicate cue type and reason): assist to don socks at bed level; modA sit<>stand Toilet Transfer: Moderate assistance;Stand-pivot;BSC;RW Toilet Transfer Details (indicate cue type and reason): simulated in transfer to [place] and Hygiene: Maximal assistance;Sit to/from stand       Functional mobility during ADLs: Moderate assistance;Rolling walker(stand pivot transfer) General ADL Comments: assisted pt OOB to recliner and set up for breakfast end of session     Vision  Perception     Praxis      Pertinent Vitals/Pain Pain Assessment: 0-10 Pain Score: 3  Pain Location: bil LEs (L>R) Pain Descriptors / Indicators: Aching;Sore Pain Intervention(s): Limited activity within patient's  tolerance;Monitored during session;Repositioned     Hand Dominance Right   Extremity/Trunk Assessment Upper Extremity Assessment Upper Extremity Assessment: Generalized weakness   Lower Extremity Assessment Lower Extremity Assessment: Defer to PT evaluation       Communication Communication Communication: HOH   Cognition Arousal/Alertness: Awake/alert Behavior During Therapy: WFL for tasks assessed/performed Overall Cognitive Status: Within Functional Limits for tasks assessed                                     General Comments       Exercises     Shoulder Instructions      Home Living Family/patient expects to be discharged to:: Private residence Living Arrangements: Children Available Help at Discharge: Family Type of Home: House Home Access: Ramped entrance     Home Layout: One level     Bathroom Shower/Tub: Teacher, early years/pre: Standard     Home Equipment: Bedside commode;Tub bench;Wheelchair - manual;Toilet riser;Walker - 2 wheels;Grab bars - tub/shower          Prior Functioning/Environment Level of Independence: Needs assistance  Gait / Transfers Assistance Needed: assist for transfers to wheel chair and bed mobility. utilizes w/c for mobility  ADL's / Homemaking Assistance Needed: assist for dressing bathing and feeding at baseline   Comments: patients son is caregiver        OT Problem List: Decreased strength;Decreased range of motion;Decreased activity tolerance;Impaired balance (sitting and/or standing);Pain      OT Treatment/Interventions: Self-care/ADL training;Therapeutic exercise;DME and/or AE instruction;Therapeutic activities;Patient/family education;Balance training    OT Goals(Current goals can be found in the care plan section) Acute Rehab OT Goals Patient Stated Goal: pt reports possible plan for SNF initially OT Goal Formulation: With patient Time For Goal Achievement: 11/06/18 Potential to  Achieve Goals: Good  OT Frequency: Min 2X/week   Barriers to D/C:            Co-evaluation              AM-PAC OT "6 Clicks" Daily Activity     Outcome Measure Help from another person eating meals?: None Help from another person taking care of personal grooming?: A Little Help from another person toileting, which includes using toliet, bedpan, or urinal?: A Lot Help from another person bathing (including washing, rinsing, drying)?: A Lot Help from another person to put on and taking off regular upper body clothing?: A Little Help from another person to put on and taking off regular lower body clothing?: A Lot 6 Click Score: 16   End of Session Equipment Utilized During Treatment: Gait belt;Rolling walker Nurse Communication: Mobility status  Activity Tolerance: Patient tolerated treatment well Patient left: in chair;with call bell/phone within reach;with chair alarm set  OT Visit Diagnosis: Unsteadiness on feet (R26.81);Muscle weakness (generalized) (M62.81)                Time: 0626-9485 OT Time Calculation (min): 31 min Charges:  OT General Charges $OT Visit: 1 Visit OT Evaluation $OT Eval Moderate Complexity: 1 Mod OT Treatments $Self Care/Home Management : 8-22 mins  Lou Cal, OT [facility] Pager [phone] Office [phone]   Raymondo Band 10/23/2018, 9:51 AM

## 2018-10-23 NOTE — NC FL2 (Signed)
[place] LEVEL OF CARE SCREENING TOOL     IDENTIFICATION  Patient Name: Dominique Mckee Birthdate: 28-Oct-1930 Sex: female Admission Date (Current Location): 10/20/2018  [facility] and [place] Number:  Herbalist and Address:  [facility], [place] [address] [place],  [address]      Provider Number: [id number]  Attending Physician Name and Address:  Roxan Hockey, MD  Relative Name and Phone Number:  Merry Proud, son, [phone]    Current Level of Care: Hospital Recommended Level of Care: [place] Prior Approval Number:    Date Approved/Denied:   PASRR Number: [id number]  Discharge Plan: SNF    Current Diagnoses: Patient Active Problem List   Diagnosis Date Noted  . Palliative care by specialist   . DNR (do not resuscitate)   . Neck pain   . Hypoxia 10/21/2018  . Nausea and vomiting 10/21/2018  . Chronic diastolic CHF (congestive heart failure) ([place]) 10/21/2018  . Status post lumbar spinal fusion 04/13/2017  . Degenerative lumbar spinal stenosis 03/15/2017  . CKD (chronic kidney disease) 01/11/2017  . Anemia 01/10/2017  . Chronic pain 01/10/2017  . Heart murmur 01/10/2017  . Acute diastolic congestive heart failure ([place]) 07/08/2015  . Acute respiratory failure with hypoxia ([place]) 07/08/2015  . Shoulder dislocation, recurrent 07/06/2015  . HTN (hypertension) 07/06/2015  . Rheumatoid arthritis ([place]) 07/06/2015  . GERD (gastroesophageal reflux disease) 07/06/2015  . Shoulder dislocation   . Concussion with no loss of consciousness 12/30/2013  . Cervical spondylosis without myelopathy 01/01/2013  . Migraine without aura 01/01/2013  . Abnormality of gait 01/01/2013  . Essential and other specified forms of tremor 01/01/2013    Orientation RESPIRATION BLADDER Height & Weight     Self, Time, Situation, Place  Normal Incontinent, External catheter Weight: 77.1 kg Height:  5\' 3"  (160 cm)  BEHAVIORAL  SYMPTOMS/MOOD NEUROLOGICAL BOWEL NUTRITION STATUS      Continent Diet(Please see DC Summary)  AMBULATORY STATUS COMMUNICATION OF NEEDS Skin   Extensive Assist Verbally Normal                       Personal Care Assistance Level of Assistance  Bathing, Feeding, Dressing Bathing Assistance: Maximum assistance Feeding assistance: Independent Dressing Assistance: Limited assistance     Functional Limitations Info  Sight, Hearing, Speech Sight Info: Adequate Hearing Info: Adequate Speech Info: Adequate    SPECIAL CARE FACTORS FREQUENCY  PT (By licensed PT), OT (By licensed OT)     PT Frequency: 5x/week OT Frequency: 3x/week            Contractures Contractures Info: Not present    Additional Factors Info  Code Status, Allergies, Psychotropic Code Status Info: DNR Allergies Info:  [place] Piroxicam, [place] Sulindac, Morphine And Related Psychotropic Info: Cymbalta         Current Medications (10/23/2018):  This is the current hospital active medication list Current Facility-Administered Medications  Medication Dose Route Frequency Provider Last Rate Last Dose  . acetaminophen (TYLENOL) tablet 650 mg  650 mg Oral Q6H PRN Etta Quill, DO       Or  . acetaminophen (TYLENOL) suppository 650 mg  650 mg Rectal Q6H PRN Etta Quill, DO      . apixaban (ELIQUIS) tablet 10 mg  10 mg Oral BID Skipper Cliche A, MD   10 mg at 10/23/18 0825  . calcium carbonate (TUMS - dosed in mg elemental calcium) chewable tablet 200 mg  of elemental calcium  1 tablet Oral BID WC Shahmehdi, Seyed A, MD   200 mg of elemental calcium at 10/23/18 0824  . DULoxetine (CYMBALTA) DR capsule 30 mg  30 mg Oral Daily Shahmehdi, Seyed A, MD   30 mg at 10/23/18 0825  . ferrous sulfate tablet 325 mg  325 mg Oral QPM Shahmehdi, Seyed A, MD   325 mg at 10/22/18 1817  . folic acid (FOLVITE) tablet 1 mg  1 mg Oral Daily Shahmehdi, Seyed A, MD   1 mg at 10/23/18 0824  . furosemide (LASIX) tablet 20  mg  20 mg Oral Daily Shahmehdi, Seyed A, MD   20 mg at 10/23/18 0824  . methotrexate (RHEUMATREX) tablet 25 mg  25 mg Oral Weekly Shahmehdi, Seyed A, MD   25 mg at 10/23/18 0826  . ondansetron (ZOFRAN) tablet 4 mg  4 mg Oral Q6H PRN Etta Quill, DO       Or  . ondansetron [facility]) injection 4 mg  4 mg Intravenous Q6H PRN Etta Quill, DO      . oxyCODONE (Oxy IR/ROXICODONE) immediate release tablet 5 mg  5 mg Oral Q6H PRN Skipper Cliche A, MD   5 mg at 10/22/18 2108  . pantoprazole (PROTONIX) EC tablet 40 mg  40 mg Oral Daily Shahmehdi, Seyed A, MD   40 mg at 10/23/18 0824  . propranolol (INDERAL) tablet 20 mg  20 mg Oral BID Skipper Cliche A, MD   20 mg at 10/22/18 2108     Discharge Medications: Please see discharge summary for a list of discharge medications.  Relevant Imaging Results:  Relevant Lab Results:   Additional Information SSN: [id number]  [place], [place]

## 2018-10-24 DIAGNOSIS — R531 Weakness: Secondary | ICD-10-CM

## 2018-10-24 DIAGNOSIS — R0902 Hypoxemia: Secondary | ICD-10-CM | POA: Diagnosis not present

## 2018-10-24 LAB — BASIC METABOLIC PANEL
Anion gap: 10 (ref 5–15)
BUN: 16 mg/dL (ref 8–23)
CO2: 32 mmol/L (ref 22–32)
CREATININE: 0.8 mg/dL (ref 0.44–1.00)
Calcium: 8.7 mg/dL — ABNORMAL LOW (ref 8.9–10.3)
Chloride: 99 mmol/L (ref 98–111)
GFR calc Af Amer: >60 mL/min (ref >60)
GFR calc non Af Amer: >60 mL/min (ref >60)
Glucose, Bld: 103 mg/dL — ABNORMAL HIGH (ref 70–99)
Potassium: 3.4 mmol/L — ABNORMAL LOW (ref 3.5–5.1)
Sodium: 141 mmol/L (ref 135–145)

## 2018-10-24 MED ORDER — POTASSIUM CHLORIDE CRYS ER 20 MEQ PO TBCR
40.0000 meq | EXTENDED_RELEASE_TABLET | Freq: Once | ORAL | Status: AC
  Administered 2018-10-24: 40 meq via ORAL
  Filled 2018-10-24: qty 2

## 2018-10-24 NOTE — Progress Notes (Signed)
Per [facility], SNF request is under review with their medical director. CSW provided them with CSW weekend contact info. Patient's son aware.   Percell Locus Dayan Desa LCSW [phone]

## 2018-10-24 NOTE — Plan of Care (Signed)
Patient is making progress toward discharge, alert and oriented, respirations even and unlabored, vital signs stable, no acute distress noted

## 2018-10-24 NOTE — Progress Notes (Signed)
Physical Therapy Treatment Patient Details Name: Dominique Mckee MRN: [id number] DOB: 06-03-1930 Today's Date: 10/24/2018    History of Present Illness 84 y.o. female with medical history significant of diastolic CHF, RA.  Patient presents to ED today with c/o headache / neck pain, fatigue, N/V.  Headache and neck pain over past 4 days, R sided, severe, some dizziness.  Had N/V today.  Seemed to onset after falling asleep with "neck bent to the right" a couple of days ago per son.    PT Comments    Patient is progressing with activity. Tolerated session focused on transfer training with RW. Improvements noted with upright activity tolerance today. No DOE or SOB noted this session. At this time, patient states that son is unable to assist and patient desires to attempt rehabilitation to improvement functional status. Agree that this would be beneficial. Recommendations updated.   Follow Up Recommendations  SNF;Supervision/Assistance - 24 hour(son unable to provide adequate assist at this time)     Equipment Recommendations  None recommended by PT    Recommendations for Other Services       Precautions / Restrictions Precautions Precautions: Fall Precaution Comments: watch o2 sats Restrictions Weight Bearing Restrictions: No    Mobility  Bed Mobility Overal bed mobility: Needs Assistance Bed Mobility: Supine to Sit;Sit to Supine     Supine to sit: Mod assist Sit to supine: Mod assist   General bed mobility comments: Moderate assist to elevate trunk and rotate to EOB, moderate assist to elevate LEs and return to supine, increased time and effort to reposition  Transfers Overall transfer level: Needs assistance Equipment used: Rolling walker (2 wheeled) Transfers: Sit to/from [facility] Sit to Stand: Mod assist Stand pivot transfers: Min assist       General transfer comment: Mod assist to elevate to upright, min assist to take pivotal steps using RW.  performed x3 during session with transfer practice to and from [facility]  Ambulation/Gait                 Stairs             Wheelchair Mobility    Modified Rankin (Stroke Patients Only)       Balance Overall balance assessment: Needs assistance Sitting-balance support: Feet supported Sitting balance-Leahy Scale: Fair Sitting balance - Comments: tolerated EOB self supported   Standing balance support: Bilateral upper extremity supported;During functional activity Standing balance-Leahy Scale: Poor Standing balance comment: heavy reliance on UE support                            Cognition Arousal/Alertness: Awake/alert Behavior During Therapy: WFL for tasks assessed/performed Overall Cognitive Status: Within Functional Limits for tasks assessed                                        Exercises      General Comments        Pertinent Vitals/Pain Pain Assessment: Faces Faces Pain Scale: Hurts little more Pain Location: back and neck Pain Descriptors / Indicators: Aching;Sore Pain Intervention(s): Monitored during session    Home Living                      Prior Function            PT Goals (current goals can now be found  in the care plan section) Acute Rehab PT Goals Patient Stated Goal: pt reports possible plan for SNF initially PT Goal Formulation: With patient Time For Goal Achievement: 11/04/18 Potential to Achieve Goals: Fair Progress towards PT goals: Progressing toward goals    Frequency    Min 2X/week      PT Plan Discharge plan needs to be updated    Co-evaluation              AM-PAC PT "6 Clicks" Mobility   Outcome Measure  Help needed turning from your back to your side while in a flat bed without using bedrails?: A Little Help needed moving from lying on your back to sitting on the side of a flat bed without using bedrails?: A Lot Help needed moving to and from a bed to a chair  (including a wheelchair)?: A Lot Help needed standing up from a chair using your arms (e.g., wheelchair or bedside chair)?: A Lot Help needed to walk in hospital room?: A Lot Help needed climbing 3-5 steps with a railing? : Total 6 Click Score: 12    End of Session Equipment Utilized During Treatment: Gait belt Activity Tolerance: Patient tolerated treatment well Patient left: in bed;with call bell/phone within reach;with bed alarm set Nurse Communication: Mobility status PT Visit Diagnosis: Other symptoms and signs involving the nervous system (R29.898);Dizziness and giddiness (R42);Muscle weakness (generalized) (M62.81)     Time: 2563-8937 PT Time Calculation (min) (ACUTE ONLY): 19 min  Charges:  $Therapeutic Activity: 8-22 mins                     Alben Deeds, PT DPT  Board Certified Neurologic Specialist [place] Pager [phone] Office [phone]    Duncan Dull 10/24/2018, 4:23 PM

## 2018-10-24 NOTE — Progress Notes (Signed)
Patient Demographics:    Dominique Mckee, is a 84 y.o. female, DOB - 17-Nov-1929, BDZ:[id number]  Admit date - 10/20/2018   Admitting Physician Etta Quill, DO  Outpatient Primary MD for the patient is Wenda Low, MD  LOS - 0   Chief Complaint  Patient presents with  . Nausea  . Emesis        Subjective:    Dominique Mckee today has no fevers, no emesis,  No chest pain, no further dizziness  no palpitations, shortness of breath at rest, c/n  to have some dyspnea on exertion  Assessment  & Plan :    Principal Problem:   Hypoxia Active Problems:   Rheumatoid arthritis (HCC)   Nausea and vomiting   Chronic diastolic CHF (congestive heart failure) ([place])   Palliative care by specialist   DNR (do not resuscitate)   Neck pain   Weakness generalized  Brief Summary 84 y.o. female with medical history significant of diastolic CHF, RA admitted on 10/21/2018 with acute hypoxia and found to have acute bilateral DVT and PE, awaiting insurance approval for transfer to SNF rehab  Plan 1)Acute bilateral DVT and PE-----CTA chest and venous Dopplers report noted, echo with preserved EF of 65%, no intracardiac thrombus, no pulmonary hypertension, continue Eliquis for full anticoagulation, some dyspnea on exertion persist, hypoxia resolving  2)PAFib--stable, TSH is 0.78 , continue propranolol 20 mg p.o. twice daily for rate control, patient already on Eliquis for PE  3)RA--- stable, patient with severe debilitating rheumatoid arthritis, continue PRN oxycodone, continue methotrexate 25 mg every Thursday, continue folic acid  4)HFpEF--- echo this admission with EF of 60 to 65%, clinically appears euvolemic and compensated, BNP  is 136 , continue Lasix 20 mg daily  5)HAs/Neck Pain-CTA head and neck without acute findings, CT of C-spine without acute findings, headache and neck pain as well as dizziness has  resolved at this time  6)Recurrent hypokalemia--- replace, recheck potassium and magnesium, most likely due to Lasix use, patient will need daily potassium supplementation while on Lasix  7)Acute hypoxic respiratory failure--- secondary to PE as above #1, hypoxia is resolving   8)Generalized weakness/debility--- PT recommended SNF, patient and family/son are now agreeable to go to SNF rehab, patient has significant difficulties with mobility related ADLs  Disposition/Need for in-Hospital Stay- patient unable to be discharged at this time due to  also awaiting insurance approval for transfer to SNF rehab  Code Status : Full  Family Communication:   None at bedside   Disposition Plan  : SNF  Rehab  DVT Prophylaxis  : Eliquis Lab Results  Component Value Date   PLT 220 10/20/2018    Inpatient Medications  Scheduled Meds: . apixaban  10 mg Oral BID  . calcium carbonate  1 tablet Oral BID WC  . DULoxetine  30 mg Oral Daily  . ferrous sulfate  325 mg Oral QPM  . folic acid  1 mg Oral Daily  . furosemide  20 mg Oral Daily  . methotrexate  25 mg Oral Weekly  . pantoprazole  40 mg Oral Daily  . polyethylene glycol  17 g Oral Daily  . propranolol  20 mg Oral BID   Continuous Infusions: PRN Meds:.acetaminophen **OR** acetaminophen, ondansetron **OR** ondansetron (  ZOFRAN) IV, oxyCODONE    Anti-infectives (From admission, onward)   None        Objective:   Vitals:   10/23/18 2123 10/24/18 0439 10/24/18 0611 10/24/18 1500  BP: (!) 143/65 136/64  138/62  Pulse: 60 61  68  Resp: 18 18  18   Temp: 98.1 F (36.7 C) 98.2 F (36.8 C)  98.9 F (37.2 C)  TempSrc: Oral Oral  Oral  SpO2: 94% 95%  98%  Weight:   68.8 kg   Height:        Wt Readings from Last 3 Encounters:  10/24/18 68.8 kg  08/23/18 73.5 kg  09/13/17 62.1 kg     Intake/Output Summary (Last 24 hours) at 10/24/2018 1940 Last data filed at 10/24/2018 1600 Gross per 24 hour  Intake 640 ml  Output 500 ml    Net 140 ml     Physical Exam Patient is examined daily including today on 10/24/18 , exams remain the same as of yesterday except that has changed   Gen:- Awake Alert,  In no apparent distress  HEENT:- [place].AT, No sclera icterus Neck-Supple Neck,No JVD,.  Nose- [place] 2 L/min Lungs-diminished in bases, no significant wheezing CV- S1, S2 normal, regular (history of A. fib but currently regular) Abd-  +ve B.Sounds, Abd Soft, No tenderness,    Extremity/Skin:- trace  edema, pedal pulses present  Psych-affect is appropriate, oriented x3 Neuro-generalized weakness, but no new focal deficits, no tremors   Data Review:   Micro Results No results found for this or any previous visit (from the past 240 hour(s)).  Radiology Reports Ct Angio Head W Or Wo Contrast  Result Date: 10/20/2018 CLINICAL DATA:  Altered mental status. Weakness and dizziness. Headache with nausea and vomiting. EXAM: CT ANGIOGRAPHY HEAD AND NECK TECHNIQUE: Multidetector CT imaging of the head and neck was performed using the standard protocol during bolus administration of intravenous contrast. Multiplanar CT image reconstructions and MIPs were obtained to evaluate the vascular anatomy. Carotid stenosis measurements (when applicable) are obtained utilizing NASCET criteria, using the distal internal carotid diameter as the denominator. CONTRAST:  42mL ISOVUE-370 IOPAMIDOL (ISOVUE-370) INJECTION 76% COMPARISON:  08/19/2017 FINDINGS: CT HEAD FINDINGS Brain: There is no mass, hemorrhage or extra-axial collection. There is generalized atrophy without lobar predilection. There is hypoattenuation of the periventricular white matter, most commonly indicating chronic ischemic microangiopathy. Skull: The visualized skull base, calvarium and extracranial soft tissues are normal. Sinuses/Orbits: No fluid levels or advanced mucosal thickening of the visualized paranasal sinuses. No mastoid or middle ear effusion. The orbits are normal. CTA NECK  FINDINGS SKELETON: There is no bony spinal canal stenosis. No lytic or blastic lesion. OTHER NECK: Normal pharynx, larynx and major salivary glands. No cervical lymphadenopathy. Unremarkable thyroid gland. UPPER CHEST: No pneumothorax or pleural effusion. No nodules or masses. AORTIC ARCH: There is mild calcific atherosclerosis of the aortic arch. There is no aneurysm, dissection or hemodynamically significant stenosis of the visualized ascending aorta and aortic arch. Conventional 3 vessel aortic branching pattern. The visualized proximal subclavian arteries are widely patent. RIGHT CAROTID SYSTEM: --Common carotid artery: Widely patent origin without common carotid artery dissection or aneurysm. --Internal carotid artery: No dissection, occlusion or aneurysm. Mild atherosclerotic calcification at the carotid bifurcation without hemodynamically significant stenosis. --External carotid artery: No acute abnormality. LEFT CAROTID SYSTEM: --Common carotid artery: Widely patent origin without common carotid artery dissection or aneurysm. --Internal carotid artery: No dissection, occlusion or aneurysm. There is predominantly calcified atherosclerosis extending into the proximal ICA,  resulting in less than 50% stenosis. --External carotid artery: No acute abnormality. VERTEBRAL ARTERIES: Codominant configuration. Both origins are normal. No dissection, occlusion or flow-limiting stenosis to the vertebrobasilar confluence. CTA HEAD FINDINGS ANTERIOR CIRCULATION: --Intracranial internal carotid arteries: Atherosclerotic calcification of the internal carotid arteries at the skull base without hemodynamically significant stenosis. --Anterior cerebral arteries: Normal. Both A1 segments are present. Patent anterior communicating artery. --Middle cerebral arteries: Normal. --Posterior communicating arteries: Present on the left, absent on the right. POSTERIOR CIRCULATION: --Basilar artery: Normal. --Posterior cerebral arteries:  Normal right. Fetal origin of the left. Mild stenosis of the proximal left P2 segment. --Superior cerebellar arteries: Normal. --Inferior cerebellar arteries: Normal anterior and posterior inferior cerebellar arteries. VENOUS SINUSES: As permitted by contrast timing, patent. ANATOMIC VARIANTS: Fetal origin of the left posterior cerebral artery. This is a normal anatomic variant. DELAYED PHASE: No parenchymal contrast enhancement. Review of the MIP images confirms the above findings. IMPRESSION: 1. No emergent large vessel occlusion or high-grade stenosis. 2. Bilateral carotid bifurcation atherosclerosis without hemodynamically significant stenosis. 3. Mild atrophy and chronic microvascular disease. Aortic atherosclerosis (ICD10-I70.0). Electronically Signed   By: Ulyses Jarred M.D.   On: 10/20/2018 17:58   Dg Chest 2 View  Result Date: 10/20/2018 CLINICAL DATA:  Generalized weakness, hypoxia, chills EXAM: CHEST - 2 VIEW COMPARISON:  10/24/2017 FINDINGS: Cardiomegaly. Large hiatal hernia. No confluent airspace opacities or effusions. No acute bony abnormality. IMPRESSION: Cardiomegaly, hiatal hernia.  No active disease. Electronically Signed   By: Rolm Baptise M.D.   On: 10/20/2018 16:31   Ct Angio Neck W And/or Wo Contrast  Result Date: 10/20/2018 CLINICAL DATA:  Altered mental status. Weakness and dizziness. Headache with nausea and vomiting. EXAM: CT ANGIOGRAPHY HEAD AND NECK TECHNIQUE: Multidetector CT imaging of the head and neck was performed using the standard protocol during bolus administration of intravenous contrast. Multiplanar CT image reconstructions and MIPs were obtained to evaluate the vascular anatomy. Carotid stenosis measurements (when applicable) are obtained utilizing NASCET criteria, using the distal internal carotid diameter as the denominator. CONTRAST:  17mL ISOVUE-370 IOPAMIDOL (ISOVUE-370) INJECTION 76% COMPARISON:  08/19/2017 FINDINGS: CT HEAD FINDINGS Brain: There is no mass,  hemorrhage or extra-axial collection. There is generalized atrophy without lobar predilection. There is hypoattenuation of the periventricular white matter, most commonly indicating chronic ischemic microangiopathy. Skull: The visualized skull base, calvarium and extracranial soft tissues are normal. Sinuses/Orbits: No fluid levels or advanced mucosal thickening of the visualized paranasal sinuses. No mastoid or middle ear effusion. The orbits are normal. CTA NECK FINDINGS SKELETON: There is no bony spinal canal stenosis. No lytic or blastic lesion. OTHER NECK: Normal pharynx, larynx and major salivary glands. No cervical lymphadenopathy. Unremarkable thyroid gland. UPPER CHEST: No pneumothorax or pleural effusion. No nodules or masses. AORTIC ARCH: There is mild calcific atherosclerosis of the aortic arch. There is no aneurysm, dissection or hemodynamically significant stenosis of the visualized ascending aorta and aortic arch. Conventional 3 vessel aortic branching pattern. The visualized proximal subclavian arteries are widely patent. RIGHT CAROTID SYSTEM: --Common carotid artery: Widely patent origin without common carotid artery dissection or aneurysm. --Internal carotid artery: No dissection, occlusion or aneurysm. Mild atherosclerotic calcification at the carotid bifurcation without hemodynamically significant stenosis. --External carotid artery: No acute abnormality. LEFT CAROTID SYSTEM: --Common carotid artery: Widely patent origin without common carotid artery dissection or aneurysm. --Internal carotid artery: No dissection, occlusion or aneurysm. There is predominantly calcified atherosclerosis extending into the proximal ICA, resulting in less than 50% stenosis. --External carotid artery: No  acute abnormality. VERTEBRAL ARTERIES: Codominant configuration. Both origins are normal. No dissection, occlusion or flow-limiting stenosis to the vertebrobasilar confluence. CTA HEAD FINDINGS ANTERIOR CIRCULATION:  --Intracranial internal carotid arteries: Atherosclerotic calcification of the internal carotid arteries at the skull base without hemodynamically significant stenosis. --Anterior cerebral arteries: Normal. Both A1 segments are present. Patent anterior communicating artery. --Middle cerebral arteries: Normal. --Posterior communicating arteries: Present on the left, absent on the right. POSTERIOR CIRCULATION: --Basilar artery: Normal. --Posterior cerebral arteries: Normal right. Fetal origin of the left. Mild stenosis of the proximal left P2 segment. --Superior cerebellar arteries: Normal. --Inferior cerebellar arteries: Normal anterior and posterior inferior cerebellar arteries. VENOUS SINUSES: As permitted by contrast timing, patent. ANATOMIC VARIANTS: Fetal origin of the left posterior cerebral artery. This is a normal anatomic variant. DELAYED PHASE: No parenchymal contrast enhancement. Review of the MIP images confirms the above findings. IMPRESSION: 1. No emergent large vessel occlusion or high-grade stenosis. 2. Bilateral carotid bifurcation atherosclerosis without hemodynamically significant stenosis. 3. Mild atrophy and chronic microvascular disease. Aortic atherosclerosis (ICD10-I70.0). Electronically Signed   By: Ulyses Jarred M.D.   On: 10/20/2018 17:58   Ct Angio Chest Pe W Or Wo Contrast  Addendum Date: 10/21/2018   ADDENDUM REPORT: 10/21/2018 13:32 ADDENDUM: Critical Value/emergent results were called by telephone at the time of interpretation on 10/21/2018 at 1:31 pm to Dr. Skipper Cliche , who verbally acknowledged these results. Electronically Signed   By: Misty Stanley M.D.   On: 10/21/2018 13:32   Result Date: 10/21/2018 CLINICAL DATA:  Weakness and dizziness. EXAM: CT ANGIOGRAPHY CHEST WITH CONTRAST TECHNIQUE: Multidetector CT imaging of the chest was performed using the standard protocol during bolus administration of intravenous contrast. Multiplanar CT image reconstructions and MIPs  were obtained to evaluate the vascular anatomy. CONTRAST:  83mL ISOVUE-370 IOPAMIDOL (ISOVUE-370) INJECTION 76% COMPARISON:  None. FINDINGS: Cardiovascular: Heart is enlarged. Coronary artery calcification is evident. Atherosclerotic calcification is noted in the wall of the thoracic aorta. Acute pulmonary embolus is identified in segmental branches to the left upper lobe (159/7 and sagittal image 78/9). No additional filling defects are identified in the pulmonary arteries to suggest additional pulmonary embolus. Mediastinum/Nodes: No mediastinal lymphadenopathy. There is no hilar lymphadenopathy. Moderate to large hiatal hernia. There is no axillary lymphadenopathy. Lungs/Pleura: Areas of interstitial and patchy ground-glass attenuation are seen in the lungs bilaterally. With volume loss in the lower lobes. No overt pulmonary edema. No substantial pleural effusion. Upper Abdomen: Unremarkable. Musculoskeletal: No worrisome lytic or sclerotic osseous abnormality. Review of the MIP images confirms the above findings. IMPRESSION: 1. Small acute pulmonary embolus involving a segmental branch to the left upper lobe. No other pulmonary embolic disease evident. 2. Patchy ground-glass attenuation bilaterally. Air trapping would be a consideration. 3. Areas of volume loss/atelectasis in the lower lungs bilaterally. 4.  Aortic Atherosclerois (ICD10-170.0) Electronically Signed: By: Misty Stanley M.D. On: 10/21/2018 13:25   Ct C-spine No Charge  Result Date: 10/20/2018 CLINICAL DATA:  84 year old female with altered mental status, neck pain, generalized weakness, dizziness. EXAM: CT CERVICAL SPINE WITH CONTRAST TECHNIQUE: Multiplanar CT images of the cervical spine were reconstructed from contemporary CTA of the Neck. CONTRAST:  No additional COMPARISON:  CTA head and neck today reported separately. Cervical spine CT 08/19/2017. FINDINGS: Alignment: Stable since 2018. Straightening and mild reversal of cervical lordosis  with mild degenerative appearing anterolisthesis of C3 on C4 and C7 on T1. Bilateral posterior element alignment is within normal limits. Skull base and vertebrae: Visualized skull base is intact.  No atlanto-occipital dissociation. No acute osseous abnormality identified in the cervical spine. Soft tissues and spinal canal: No prevertebral fluid or swelling. No visible canal hematoma. Arterial and other neck soft tissue findings are reported on the CTA today separately. Disc levels: Progressed since 2018 and severe right side C1-C2 joint space loss. Advanced disc and endplate degeneration except at C2-C3 and C7-T1. Advanced facet degeneration throughout the left cervical spine. Possible developing posterior element ankylosis on the left at C3-C4, while ankylosis has developed since 2018 on the left at C4-C5. Mild chronic spinal stenosis suspected C3-C4 through C5-C6. Upper chest: Visible upper thoracic levels appear grossly intact. Partially visible advanced right shoulder degeneration. Mild nonspecific upper lung mosaic attenuation, probably gas trapping. Other upper chest findings reported separately on the CTA today. Other: Severe TMJ degeneration greater on the right. IMPRESSION: 1. No acute osseous abnormality in the cervical spine. 2. Advanced cervical spine degeneration with progression since 2018. - severe right C1-C2 joint space loss. - degenerative ankylosis is developing at the left posterior elements of C3 through C5. - multilevel mild spinal stenosis. 3. CTA head and neck today are reported separately. Electronically Signed   By: Genevie Ann M.D.   On: 10/20/2018 17:57   Vas [place] Lower Extremity Venous (dvt)  Result Date: 10/22/2018  Lower Venous Study Indications: History of DVT, and Pain.  Limitations: Body habitus and Pain. Performing Technologist: Rudell Cobb  Examination Guidelines: A complete evaluation includes B-mode imaging, spectral Doppler, color Doppler, and power Doppler as needed of all  accessible portions of each vessel. Bilateral testing is considered an integral part of a complete examination. Limited examinations for reoccurring indications may be performed as noted.  Right Venous Findings: +---------+---------------+---------+-----------+----------------------+-------+          CompressibilityPhasicitySpontaneityProperties            Summary +---------+---------------+---------+-----------+----------------------+-------+ CFV      None                    Yes        partially             Acute                                               re-cannalized                 +---------+---------------+---------+-----------+----------------------+-------+ SFJ      None                                                     Acute   +---------+---------------+---------+-----------+----------------------+-------+ FV Prox  None                                                     Acute   +---------+---------------+---------+-----------+----------------------+-------+ FV Mid   Full                                                             +---------+---------------+---------+-----------+----------------------+-------+  FV DistalPartial                                                  Acute   +---------+---------------+---------+-----------+----------------------+-------+ PFV      Partial                                                  Acute   +---------+---------------+---------+-----------+----------------------+-------+ POP      Full           Yes      Yes                                      +---------+---------------+---------+-----------+----------------------+-------+ PTV      Partial                 No                               Acute   +---------+---------------+---------+-----------+----------------------+-------+ PERO     Partial                 No                               Acute    +---------+---------------+---------+-----------+----------------------+-------+ Right external iliac vein: heterogenous material in the lumen of vessel with doppler demonstrates patency, IVC patency examed with limited visibility.  Left Venous Findings: +---------+---------------+---------+-----------+---------------+--------------+          CompressibilityPhasicitySpontaneityProperties     Summary        +---------+---------------+---------+-----------+---------------+--------------+ CFV      Full           Yes      Yes                                      +---------+---------------+---------+-----------+---------------+--------------+ SFJ      Full                                                             +---------+---------------+---------+-----------+---------------+--------------+ FV Prox  Full                                                             +---------+---------------+---------+-----------+---------------+--------------+ FV Mid   Full                                              dual system    +---------+---------------+---------+-----------+---------------+--------------+ FV Distal  Yes                       not well                                                                  visualized                                                                with                                                                      compression    +---------+---------------+---------+-----------+---------------+--------------+ PFV      Full                                                             +---------+---------------+---------+-----------+---------------+--------------+ POP      Full           Yes      Yes                                      +---------+---------------+---------+-----------+---------------+--------------+ PTV      Partial                            partially      Acute                                                       re-cannalized                 +---------+---------------+---------+-----------+---------------+--------------+ PERO     Partial                            partially      Acute                                                      re-cannalized                 +---------+---------------+---------+-----------+---------------+--------------+    Summary: Right: Findings consistent with acute deep vein thrombosis involving the right common femoral  vein, right femoral vein, right posterior tibial vein, and right peroneal vein. Right external iliac vein examed: heterogenous material in the lumen of vessel with doppler demonstrates patency, IVC patency examed with limited visibility. No cystic structure found in the popliteal fossa. Left: Findings consistent with acute deep vein thrombosis involving the left posterior tibial vein, and left peroneal vein. No cystic structure found in the popliteal fossa.  *See table(s) above for measurements and observations. Electronically signed by Harold Barban MD on 10/22/2018 at 1:50:01 PM.    Final      CBC Recent Labs  Lab 10/20/18 1643  WBC 8.2  HGB 13.1  HCT 42.8  PLT 220  MCV 102.6*  MCH 31.4  MCHC 30.6  RDW 13.4  LYMPHSABS 1.2  MONOABS 0.2  EOSABS 0.0  BASOSABS 0.0    Chemistries  Recent Labs  Lab 10/20/18 1423 10/20/18 1520 10/22/18 0543 10/23/18 0409 10/24/18 0317  NA 142  --  142 141 141  K 3.6  --  2.6* 3.8 3.4*  CL 102  --  99 101 99  CO2 25  --  32 32 32  GLUCOSE 142*  --  95 107* 103*  BUN 19  --  11 13 16   CREATININE 0.79  --  0.76 0.96 0.80  CALCIUM 8.9  --  8.5* 8.2* 8.7*  MG  --  1.9  --  1.7  --   AST 31  --   --   --   --   ALT 13  --   --   --   --   ALKPHOS 59  --   --   --   --   BILITOT 1.9*  --   --   --   --    ------------------------------------------------------------------------------------------------------------------ No results for input(s):  CHOL, HDL, LDLCALC, TRIG, CHOLHDL, LDLDIRECT in the last 72 hours.  Lab Results  Component Value Date   HGBA1C 6.4 (H) 05/30/2011  ----------------------------------------------------------------------------------------------------------------    Component Value Date/Time   BNP 136.5 (H) 10/20/2018 1245    Roxan Hockey M.D on 10/24/2018 at 7:40 PM  Pager---[phone] Go to [email or web address] - password [email or web address] for contact info  [facility] - Office  [phone]

## 2018-10-25 DIAGNOSIS — R0902 Hypoxemia: Secondary | ICD-10-CM | POA: Diagnosis not present

## 2018-10-25 LAB — BASIC METABOLIC PANEL
Anion gap: 11 (ref 5–15)
BUN: 20 mg/dL (ref 8–23)
CO2: 32 mmol/L (ref 22–32)
Calcium: 8.9 mg/dL (ref 8.9–10.3)
Chloride: 99 mmol/L (ref 98–111)
Creatinine, Ser: 0.92 mg/dL (ref 0.44–1.00)
GFR calc Af Amer: >60 mL/min (ref >60)
GFR, EST NON AFRICAN AMERICAN: 56 mL/min — AB (ref >60)
Glucose, Bld: 107 mg/dL — ABNORMAL HIGH (ref 70–99)
Potassium: 4 mmol/L (ref 3.5–5.1)
Sodium: 142 mmol/L (ref 135–145)

## 2018-10-25 NOTE — Progress Notes (Signed)
Patient Demographics:    Dominique Mckee, is a 84 y.o. female, DOB - May 14, 1930, VOH:[id number]  Admit date - 10/20/2018   Admitting Physician Etta Quill, DO  Outpatient Primary MD for the patient is Wenda Low, MD  LOS - 0   Chief Complaint  Patient presents with  . Nausea  . Emesis        Subjective:    Dominique Mckee today has no fevers, no emesis,  No chest pain, no further dizziness  no palpitations,  patient continues to complain of fatigue, weakness and dyspnea on exertion  Assessment  & Plan :    Principal Problem:   Hypoxia Active Problems:   Rheumatoid arthritis (HCC)   Nausea and vomiting   Chronic diastolic CHF (congestive heart failure) ([place])   Palliative care by specialist   DNR (do not resuscitate)   Neck pain   Weakness generalized  Brief Summary 84 y.o. female with medical history significant of diastolic CHF, RA admitted on 10/21/2018 with acute hypoxia and found to have acute bilateral DVT and PE, as of 10/25/2018 still awaiting insurance approval for transfer to SNF rehab  Plan 1)Acute bilateral DVT and PE-----CTA chest and venous Dopplers report noted, echo with preserved EF of 65%, no intracardiac thrombus, no pulmonary hypertension, continue Eliquis for full anticoagulation, some dyspnea on exertion persist, hypoxia resolving  2)PAFib--stable, TSH is 0.78 , continue propranolol 20 mg p.o. twice daily for rate control, patient already on Eliquis for PE  3)RA--- stable, patient with severe debilitating rheumatoid arthritis, continue PRN oxycodone, continue methotrexate 25 mg every Thursday, continue folic acid  4)HFpEF--- echo this admission with EF of 60 to 65%, clinically appears euvolemic and compensated, BNP  is 136 , continue Lasix 20 mg daily  5)HAs/Neck Pain-CTA head and neck without acute findings, CT of C-spine without acute findings, headache and neck pain  as well as dizziness has resolved at this time  6)Recurrent hypokalemia--- replace, recheck potassium and magnesium, most likely due to Lasix use, patient will need daily potassium supplementation while on Lasix  7)Acute hypoxic respiratory failure--- secondary to PE as above #1, hypoxia is resolving  8)Generalized weakness/debility--- PT recommended SNF, patient and family/son are now agreeable to go to SNF rehab, patient has significant difficulties with mobility related ADLs  Disposition/Need for in-Hospital Stay- patient unable to be discharged at this time due to  also awaiting insurance approval for transfer to SNF rehab  Code Status : Full  Family Communication:   None at bedside   Disposition Plan  : SNF  Rehab  DVT Prophylaxis  : Eliquis Lab Results  Component Value Date   PLT 220 10/20/2018    Inpatient Medications  Scheduled Meds: . apixaban  10 mg Oral BID  . calcium carbonate  1 tablet Oral BID WC  . DULoxetine  30 mg Oral Daily  . ferrous sulfate  325 mg Oral QPM  . folic acid  1 mg Oral Daily  . furosemide  20 mg Oral Daily  . methotrexate  25 mg Oral Weekly  . pantoprazole  40 mg Oral Daily  . polyethylene glycol  17 g Oral Daily  . propranolol  20 mg Oral BID   Continuous Infusions: PRN Meds:.acetaminophen **OR** acetaminophen, ondansetron **  OR** ondansetron (ZOFRAN) IV, oxyCODONE    Anti-infectives (From admission, onward)   None        Objective:   Vitals:   10/24/18 1500 10/24/18 2111 10/25/18 0439 10/25/18 1244  BP: 138/62 129/68 124/63 (!) 155/66  Pulse: 68 69 (!) 54 (!) 57  Resp: 18 18 18 20   Temp: 98.9 F (37.2 C) 98.2 F (36.8 C) 98 F (36.7 C) 98.9 F (37.2 C)  TempSrc: Oral Oral Oral Oral  SpO2: 98% 96% 95% 96%  Weight:      Height:        Wt Readings from Last 3 Encounters:  10/24/18 68.8 kg  08/23/18 73.5 kg  09/13/17 62.1 kg     Intake/Output Summary (Last 24 hours) at 10/25/2018 1644 Last data filed at 10/25/2018  1300 Gross per 24 hour  Intake 294 ml  Output 900 ml  Net -606 ml     Physical Exam Patient is examined daily including today on 10/25/18 , exams remain the same as of yesterday except that has changed   Gen:- Awake Alert,  In no apparent distress  HEENT:- [place].AT, No sclera icterus Neck-Supple Neck,No JVD,.  Nose- [place] 1 L/min Lungs-improving air movement, no rhonchi or rails CV- S1, S2 normal, regular (history of A. fib but currently regular) Abd-  +ve B.Sounds, Abd Soft, No tenderness,    Extremity/Skin:- trace  edema, pedal pulses present  Psych-affect is appropriate, oriented x3 Neuro-generalized weakness, but no new focal deficits, no tremors   Data Review:   Micro Results No results found for this or any previous visit (from the past 240 hour(s)).  Radiology Reports Ct Angio Head W Or Wo Contrast  Result Date: 10/20/2018 CLINICAL DATA:  Altered mental status. Weakness and dizziness. Headache with nausea and vomiting. EXAM: CT ANGIOGRAPHY HEAD AND NECK TECHNIQUE: Multidetector CT imaging of the head and neck was performed using the standard protocol during bolus administration of intravenous contrast. Multiplanar CT image reconstructions and MIPs were obtained to evaluate the vascular anatomy. Carotid stenosis measurements (when applicable) are obtained utilizing NASCET criteria, using the distal internal carotid diameter as the denominator. CONTRAST:  85mL ISOVUE-370 IOPAMIDOL (ISOVUE-370) INJECTION 76% COMPARISON:  08/19/2017 FINDINGS: CT HEAD FINDINGS Brain: There is no mass, hemorrhage or extra-axial collection. There is generalized atrophy without lobar predilection. There is hypoattenuation of the periventricular white matter, most commonly indicating chronic ischemic microangiopathy. Skull: The visualized skull base, calvarium and extracranial soft tissues are normal. Sinuses/Orbits: No fluid levels or advanced mucosal thickening of the visualized paranasal sinuses. No mastoid  or middle ear effusion. The orbits are normal. CTA NECK FINDINGS SKELETON: There is no bony spinal canal stenosis. No lytic or blastic lesion. OTHER NECK: Normal pharynx, larynx and major salivary glands. No cervical lymphadenopathy. Unremarkable thyroid gland. UPPER CHEST: No pneumothorax or pleural effusion. No nodules or masses. AORTIC ARCH: There is mild calcific atherosclerosis of the aortic arch. There is no aneurysm, dissection or hemodynamically significant stenosis of the visualized ascending aorta and aortic arch. Conventional 3 vessel aortic branching pattern. The visualized proximal subclavian arteries are widely patent. RIGHT CAROTID SYSTEM: --Common carotid artery: Widely patent origin without common carotid artery dissection or aneurysm. --Internal carotid artery: No dissection, occlusion or aneurysm. Mild atherosclerotic calcification at the carotid bifurcation without hemodynamically significant stenosis. --External carotid artery: No acute abnormality. LEFT CAROTID SYSTEM: --Common carotid artery: Widely patent origin without common carotid artery dissection or aneurysm. --Internal carotid artery: No dissection, occlusion or aneurysm. There is predominantly calcified  atherosclerosis extending into the proximal ICA, resulting in less than 50% stenosis. --External carotid artery: No acute abnormality. VERTEBRAL ARTERIES: Codominant configuration. Both origins are normal. No dissection, occlusion or flow-limiting stenosis to the vertebrobasilar confluence. CTA HEAD FINDINGS ANTERIOR CIRCULATION: --Intracranial internal carotid arteries: Atherosclerotic calcification of the internal carotid arteries at the skull base without hemodynamically significant stenosis. --Anterior cerebral arteries: Normal. Both A1 segments are present. Patent anterior communicating artery. --Middle cerebral arteries: Normal. --Posterior communicating arteries: Present on the left, absent on the right. POSTERIOR CIRCULATION:  --Basilar artery: Normal. --Posterior cerebral arteries: Normal right. Fetal origin of the left. Mild stenosis of the proximal left P2 segment. --Superior cerebellar arteries: Normal. --Inferior cerebellar arteries: Normal anterior and posterior inferior cerebellar arteries. VENOUS SINUSES: As permitted by contrast timing, patent. ANATOMIC VARIANTS: Fetal origin of the left posterior cerebral artery. This is a normal anatomic variant. DELAYED PHASE: No parenchymal contrast enhancement. Review of the MIP images confirms the above findings. IMPRESSION: 1. No emergent large vessel occlusion or high-grade stenosis. 2. Bilateral carotid bifurcation atherosclerosis without hemodynamically significant stenosis. 3. Mild atrophy and chronic microvascular disease. Aortic atherosclerosis (ICD10-I70.0). Electronically Signed   By: Ulyses Jarred M.D.   On: 10/20/2018 17:58   Dg Chest 2 View  Result Date: 10/20/2018 CLINICAL DATA:  Generalized weakness, hypoxia, chills EXAM: CHEST - 2 VIEW COMPARISON:  10/24/2017 FINDINGS: Cardiomegaly. Large hiatal hernia. No confluent airspace opacities or effusions. No acute bony abnormality. IMPRESSION: Cardiomegaly, hiatal hernia.  No active disease. Electronically Signed   By: Rolm Baptise M.D.   On: 10/20/2018 16:31   Ct Angio Neck W And/or Wo Contrast  Result Date: 10/20/2018 CLINICAL DATA:  Altered mental status. Weakness and dizziness. Headache with nausea and vomiting. EXAM: CT ANGIOGRAPHY HEAD AND NECK TECHNIQUE: Multidetector CT imaging of the head and neck was performed using the standard protocol during bolus administration of intravenous contrast. Multiplanar CT image reconstructions and MIPs were obtained to evaluate the vascular anatomy. Carotid stenosis measurements (when applicable) are obtained utilizing NASCET criteria, using the distal internal carotid diameter as the denominator. CONTRAST:  30mL ISOVUE-370 IOPAMIDOL (ISOVUE-370) INJECTION 76% COMPARISON:   08/19/2017 FINDINGS: CT HEAD FINDINGS Brain: There is no mass, hemorrhage or extra-axial collection. There is generalized atrophy without lobar predilection. There is hypoattenuation of the periventricular white matter, most commonly indicating chronic ischemic microangiopathy. Skull: The visualized skull base, calvarium and extracranial soft tissues are normal. Sinuses/Orbits: No fluid levels or advanced mucosal thickening of the visualized paranasal sinuses. No mastoid or middle ear effusion. The orbits are normal. CTA NECK FINDINGS SKELETON: There is no bony spinal canal stenosis. No lytic or blastic lesion. OTHER NECK: Normal pharynx, larynx and major salivary glands. No cervical lymphadenopathy. Unremarkable thyroid gland. UPPER CHEST: No pneumothorax or pleural effusion. No nodules or masses. AORTIC ARCH: There is mild calcific atherosclerosis of the aortic arch. There is no aneurysm, dissection or hemodynamically significant stenosis of the visualized ascending aorta and aortic arch. Conventional 3 vessel aortic branching pattern. The visualized proximal subclavian arteries are widely patent. RIGHT CAROTID SYSTEM: --Common carotid artery: Widely patent origin without common carotid artery dissection or aneurysm. --Internal carotid artery: No dissection, occlusion or aneurysm. Mild atherosclerotic calcification at the carotid bifurcation without hemodynamically significant stenosis. --External carotid artery: No acute abnormality. LEFT CAROTID SYSTEM: --Common carotid artery: Widely patent origin without common carotid artery dissection or aneurysm. --Internal carotid artery: No dissection, occlusion or aneurysm. There is predominantly calcified atherosclerosis extending into the proximal ICA, resulting in less than  50% stenosis. --External carotid artery: No acute abnormality. VERTEBRAL ARTERIES: Codominant configuration. Both origins are normal. No dissection, occlusion or flow-limiting stenosis to the  vertebrobasilar confluence. CTA HEAD FINDINGS ANTERIOR CIRCULATION: --Intracranial internal carotid arteries: Atherosclerotic calcification of the internal carotid arteries at the skull base without hemodynamically significant stenosis. --Anterior cerebral arteries: Normal. Both A1 segments are present. Patent anterior communicating artery. --Middle cerebral arteries: Normal. --Posterior communicating arteries: Present on the left, absent on the right. POSTERIOR CIRCULATION: --Basilar artery: Normal. --Posterior cerebral arteries: Normal right. Fetal origin of the left. Mild stenosis of the proximal left P2 segment. --Superior cerebellar arteries: Normal. --Inferior cerebellar arteries: Normal anterior and posterior inferior cerebellar arteries. VENOUS SINUSES: As permitted by contrast timing, patent. ANATOMIC VARIANTS: Fetal origin of the left posterior cerebral artery. This is a normal anatomic variant. DELAYED PHASE: No parenchymal contrast enhancement. Review of the MIP images confirms the above findings. IMPRESSION: 1. No emergent large vessel occlusion or high-grade stenosis. 2. Bilateral carotid bifurcation atherosclerosis without hemodynamically significant stenosis. 3. Mild atrophy and chronic microvascular disease. Aortic atherosclerosis (ICD10-I70.0). Electronically Signed   By: Ulyses Jarred M.D.   On: 10/20/2018 17:58   Ct Angio Chest Pe W Or Wo Contrast  Addendum Date: 10/21/2018   ADDENDUM REPORT: 10/21/2018 13:32 ADDENDUM: Critical Value/emergent results were called by telephone at the time of interpretation on 10/21/2018 at 1:31 pm to Dr. Skipper Cliche , who verbally acknowledged these results. Electronically Signed   By: Misty Stanley M.D.   On: 10/21/2018 13:32   Result Date: 10/21/2018 CLINICAL DATA:  Weakness and dizziness. EXAM: CT ANGIOGRAPHY CHEST WITH CONTRAST TECHNIQUE: Multidetector CT imaging of the chest was performed using the standard protocol during bolus administration of  intravenous contrast. Multiplanar CT image reconstructions and MIPs were obtained to evaluate the vascular anatomy. CONTRAST:  1mL ISOVUE-370 IOPAMIDOL (ISOVUE-370) INJECTION 76% COMPARISON:  None. FINDINGS: Cardiovascular: Heart is enlarged. Coronary artery calcification is evident. Atherosclerotic calcification is noted in the wall of the thoracic aorta. Acute pulmonary embolus is identified in segmental branches to the left upper lobe (159/7 and sagittal image 78/9). No additional filling defects are identified in the pulmonary arteries to suggest additional pulmonary embolus. Mediastinum/Nodes: No mediastinal lymphadenopathy. There is no hilar lymphadenopathy. Moderate to large hiatal hernia. There is no axillary lymphadenopathy. Lungs/Pleura: Areas of interstitial and patchy ground-glass attenuation are seen in the lungs bilaterally. With volume loss in the lower lobes. No overt pulmonary edema. No substantial pleural effusion. Upper Abdomen: Unremarkable. Musculoskeletal: No worrisome lytic or sclerotic osseous abnormality. Review of the MIP images confirms the above findings. IMPRESSION: 1. Small acute pulmonary embolus involving a segmental branch to the left upper lobe. No other pulmonary embolic disease evident. 2. Patchy ground-glass attenuation bilaterally. Air trapping would be a consideration. 3. Areas of volume loss/atelectasis in the lower lungs bilaterally. 4.  Aortic Atherosclerois (ICD10-170.0) Electronically Signed: By: Misty Stanley M.D. On: 10/21/2018 13:25   Ct C-spine No Charge  Result Date: 10/20/2018 CLINICAL DATA:  84 year old female with altered mental status, neck pain, generalized weakness, dizziness. EXAM: CT CERVICAL SPINE WITH CONTRAST TECHNIQUE: Multiplanar CT images of the cervical spine were reconstructed from contemporary CTA of the Neck. CONTRAST:  No additional COMPARISON:  CTA head and neck today reported separately. Cervical spine CT 08/19/2017. FINDINGS: Alignment:  Stable since 2018. Straightening and mild reversal of cervical lordosis with mild degenerative appearing anterolisthesis of C3 on C4 and C7 on T1. Bilateral posterior element alignment is within normal limits. Skull base and  vertebrae: Visualized skull base is intact. No atlanto-occipital dissociation. No acute osseous abnormality identified in the cervical spine. Soft tissues and spinal canal: No prevertebral fluid or swelling. No visible canal hematoma. Arterial and other neck soft tissue findings are reported on the CTA today separately. Disc levels: Progressed since 2018 and severe right side C1-C2 joint space loss. Advanced disc and endplate degeneration except at C2-C3 and C7-T1. Advanced facet degeneration throughout the left cervical spine. Possible developing posterior element ankylosis on the left at C3-C4, while ankylosis has developed since 2018 on the left at C4-C5. Mild chronic spinal stenosis suspected C3-C4 through C5-C6. Upper chest: Visible upper thoracic levels appear grossly intact. Partially visible advanced right shoulder degeneration. Mild nonspecific upper lung mosaic attenuation, probably gas trapping. Other upper chest findings reported separately on the CTA today. Other: Severe TMJ degeneration greater on the right. IMPRESSION: 1. No acute osseous abnormality in the cervical spine. 2. Advanced cervical spine degeneration with progression since 2018. - severe right C1-C2 joint space loss. - degenerative ankylosis is developing at the left posterior elements of C3 through C5. - multilevel mild spinal stenosis. 3. CTA head and neck today are reported separately. Electronically Signed   By: Genevie Ann M.D.   On: 10/20/2018 17:57   Vas [place] Lower Extremity Venous (dvt)  Result Date: 10/22/2018  Lower Venous Study Indications: History of DVT, and Pain.  Limitations: Body habitus and Pain. Performing Technologist: Rudell Cobb  Examination Guidelines: A complete evaluation includes B-mode imaging,  spectral Doppler, color Doppler, and power Doppler as needed of all accessible portions of each vessel. Bilateral testing is considered an integral part of a complete examination. Limited examinations for reoccurring indications may be performed as noted.  Right Venous Findings: +---------+---------------+---------+-----------+----------------------+-------+          CompressibilityPhasicitySpontaneityProperties            Summary +---------+---------------+---------+-----------+----------------------+-------+ CFV      None                    Yes        partially             Acute                                               re-cannalized                 +---------+---------------+---------+-----------+----------------------+-------+ SFJ      None                                                     Acute   +---------+---------------+---------+-----------+----------------------+-------+ FV Prox  None                                                     Acute   +---------+---------------+---------+-----------+----------------------+-------+ FV Mid   Full                                                             +---------+---------------+---------+-----------+----------------------+-------+  FV DistalPartial                                                  Acute   +---------+---------------+---------+-----------+----------------------+-------+ PFV      Partial                                                  Acute   +---------+---------------+---------+-----------+----------------------+-------+ POP      Full           Yes      Yes                                      +---------+---------------+---------+-----------+----------------------+-------+ PTV      Partial                 No                               Acute   +---------+---------------+---------+-----------+----------------------+-------+ PERO     Partial                 No                                Acute   +---------+---------------+---------+-----------+----------------------+-------+ Right external iliac vein: heterogenous material in the lumen of vessel with doppler demonstrates patency, IVC patency examed with limited visibility.  Left Venous Findings: +---------+---------------+---------+-----------+---------------+--------------+          CompressibilityPhasicitySpontaneityProperties     Summary        +---------+---------------+---------+-----------+---------------+--------------+ CFV      Full           Yes      Yes                                      +---------+---------------+---------+-----------+---------------+--------------+ SFJ      Full                                                             +---------+---------------+---------+-----------+---------------+--------------+ FV Prox  Full                                                             +---------+---------------+---------+-----------+---------------+--------------+ FV Mid   Full                                              dual system    +---------+---------------+---------+-----------+---------------+--------------+ FV Distal  Yes                       not well                                                                  visualized                                                                with                                                                      compression    +---------+---------------+---------+-----------+---------------+--------------+ PFV      Full                                                             +---------+---------------+---------+-----------+---------------+--------------+ POP      Full           Yes      Yes                                      +---------+---------------+---------+-----------+---------------+--------------+ PTV      Partial                             partially      Acute                                                      re-cannalized                 +---------+---------------+---------+-----------+---------------+--------------+ PERO     Partial                            partially      Acute                                                      re-cannalized                 +---------+---------------+---------+-----------+---------------+--------------+    Summary: Right: Findings consistent with acute deep vein thrombosis involving the right common femoral  vein, right femoral vein, right posterior tibial vein, and right peroneal vein. Right external iliac vein examed: heterogenous material in the lumen of vessel with doppler demonstrates patency, IVC patency examed with limited visibility. No cystic structure found in the popliteal fossa. Left: Findings consistent with acute deep vein thrombosis involving the left posterior tibial vein, and left peroneal vein. No cystic structure found in the popliteal fossa.  *See table(s) above for measurements and observations. Electronically signed by Harold Barban MD on 10/22/2018 at 1:50:01 PM.    Final      CBC Recent Labs  Lab 10/20/18 1643  WBC 8.2  HGB 13.1  HCT 42.8  PLT 220  MCV 102.6*  MCH 31.4  MCHC 30.6  RDW 13.4  LYMPHSABS 1.2  MONOABS 0.2  EOSABS 0.0  BASOSABS 0.0    Chemistries  Recent Labs  Lab 10/20/18 1423 10/20/18 1520 10/22/18 0543 10/23/18 0409 10/24/18 0317 10/25/18 0408  NA 142  --  142 141 141 142  K 3.6  --  2.6* 3.8 3.4* 4.0  CL 102  --  99 101 99 99  CO2 25  --  32 32 32 32  GLUCOSE 142*  --  95 107* 103* 107*  BUN 19  --  11 13 16 20   CREATININE 0.79  --  0.76 0.96 0.80 0.92  CALCIUM 8.9  --  8.5* 8.2* 8.7* 8.9  MG  --  1.9  --  1.7  --   --   AST 31  --   --   --   --   --   ALT 13  --   --   --   --   --   ALKPHOS 59  --   --   --   --   --   BILITOT 1.9*  --   --   --   --   --     ------------------------------------------------------------------------------------------------------------------ No results for input(s): CHOL, HDL, LDLCALC, TRIG, CHOLHDL, LDLDIRECT in the last 72 hours.  Lab Results  Component Value Date   HGBA1C 6.4 (H) 05/30/2011  ----------------------------------------------------------------------------------------------------------------    Component Value Date/Time   BNP 136.5 (H) 10/20/2018 9628    Roxan Hockey M.D on 10/25/2018 at 4:44 PM  Pager---[phone] Go to [email or web address] - password [email or web address] for contact info  [facility] - Office  [phone]

## 2018-10-26 DIAGNOSIS — R0902 Hypoxemia: Secondary | ICD-10-CM | POA: Diagnosis not present

## 2018-10-26 LAB — BASIC METABOLIC PANEL
Anion gap: 11 (ref 5–15)
BUN: 22 mg/dL (ref 8–23)
CO2: 32 mmol/L (ref 22–32)
Calcium: 9.1 mg/dL (ref 8.9–10.3)
Chloride: 96 mmol/L — ABNORMAL LOW (ref 98–111)
Creatinine, Ser: 1.09 mg/dL — ABNORMAL HIGH (ref 0.44–1.00)
GFR calc Af Amer: 53 mL/min — ABNORMAL LOW (ref >60)
GFR calc non Af Amer: 46 mL/min — ABNORMAL LOW (ref >60)
GLUCOSE: 92 mg/dL (ref 70–99)
Potassium: 3.6 mmol/L (ref 3.5–5.1)
Sodium: 139 mmol/L (ref 135–145)

## 2018-10-26 NOTE — Progress Notes (Signed)
Patient Demographics:    Dominique Mckee, is a 84 y.o. female, DOB - February 13, 1930, [facility]:[id number]  Admit date - 10/20/2018   Admitting Physician Etta Quill, DO  Outpatient Primary MD for the patient is Wenda Low, MD  LOS - 0   Chief Complaint  Patient presents with  . Nausea  . Emesis        Subjective:    [name] today has no fevers, no emesis,  No chest pain, no further dizziness  no palpitations,  patient continues to complain of fatigue, weakness and dyspnea on exertion  Assessment  & Plan :    Principal Problem:   Hypoxia Active Problems:   Rheumatoid arthritis (HCC)   Nausea and vomiting   Chronic diastolic CHF (congestive heart failure) ([place])   Palliative care by specialist   DNR (do not resuscitate)   Neck pain   Weakness generalized  Brief Summary 84 y.o. female with medical history significant of diastolic CHF, RA admitted on 10/21/2018 with acute hypoxia and found to have acute bilateral DVT and PE, as of 10/26/2018 still awaiting insurance approval for transfer to SNF rehab  Plan 1)Acute bilateral DVT and PE-----CTA chest and venous Dopplers report noted, echo with preserved EF of 65%, no intracardiac thrombus, no pulmonary hypertension, continue Eliquis for full anticoagulation, some dyspnea on exertion persist, hypoxia resolved  2)PAFib--stable, TSH is 0.78 , continue propranolol 20 mg p.o. twice daily for rate control, patient already on Eliquis for PE  3)RA--- stable, patient with severe debilitating rheumatoid arthritis, continue PRN oxycodone, continue methotrexate 25 mg every Thursday, continue folic acid  4)HFpEF--- echo this admission with EF of 60 to 65%, clinically appears euvolemic and compensated, BNP  is 136 , continue Lasix 20 mg daily  5)HAs/Neck Pain-CTA head and neck without acute findings, CT of C-spine without acute findings, headache and neck pain  as well as dizziness has resolved at this time  6)Recurrent hypokalemia--- replace, recheck potassium and magnesium, most likely due to Lasix use, patient will need daily potassium supplementation while on Lasix  7)Acute hypoxic respiratory failure--- secondary to PE as above #1, hypoxia has mostly resolved  8)Generalized weakness/debility--- PT recommended SNF, patient and family/son are now agreeable to go to SNF rehab, patient continues to have difficulties with mobility related ADLs  Disposition/Need for in-Hospital Stay- patient unable to be discharged at this time due to  also awaiting insurance approval for transfer to SNF rehab  Code Status : Full  Family Communication:   None at bedside   Disposition Plan  : SNF  Rehab  DVT Prophylaxis  : Eliquis Lab Results  Component Value Date   PLT 220 10/20/2018    Inpatient Medications  Scheduled Meds: . apixaban  10 mg Oral BID  . calcium carbonate  1 tablet Oral BID WC  . DULoxetine  30 mg Oral Daily  . ferrous sulfate  325 mg Oral QPM  . folic acid  1 mg Oral Daily  . furosemide  20 mg Oral Daily  . methotrexate  25 mg Oral Weekly  . pantoprazole  40 mg Oral Daily  . polyethylene glycol  17 g Oral Daily  . propranolol  20 mg Oral BID   Continuous Infusions: PRN Meds:.acetaminophen **OR**  acetaminophen, ondansetron **OR** ondansetron (ZOFRAN) IV, oxyCODONE    Anti-infectives (From admission, onward)   None        Objective:   Vitals:   10/25/18 2206 10/26/18 0458 10/26/18 0458 10/26/18 1350  BP: 115/68  105/72 124/64  Pulse: 63  (!) 57 (!) 58  Resp:    20  Temp: 98.4 F (36.9 C)  98.4 F (36.9 C) 98 F (36.7 C)  TempSrc:    Oral  SpO2: 96%  95% 93%  Weight:  71.3 kg    Height:        Wt Readings from Last 3 Encounters:  10/26/18 71.3 kg  08/23/18 73.5 kg  09/13/17 62.1 kg     Intake/Output Summary (Last 24 hours) at 10/26/2018 1911 Last data filed at 10/26/2018 1826 Gross per 24 hour  Intake  616 ml  Output 200 ml  Net 416 ml     Physical Exam Patient is examined daily including today on 10/26/18 , exams remain the same as of yesterday except that has changed   Gen:- Awake Alert,  In no apparent distress  HEENT:- [place].AT, No sclera icterus Neck-Supple Neck,No JVD,.  Lungs-improving air movement, no rhonchi or rails CV- S1, S2 normal, 3/6 SM,  regular (history of A. fib but currently regular) Abd-  +ve B.Sounds, Abd Soft, No tenderness,    Extremity/Skin:- trace  edema, pedal pulses present  Psych-affect is appropriate, oriented x3 Neuro-generalized weakness, but no new focal deficits, no tremors   Data Review:   Micro Results No results found for this or any previous visit (from the past 240 hour(s)).  Radiology Reports Ct Angio Head W Or Wo Contrast  Result Date: 10/20/2018 CLINICAL DATA:  Altered mental status. Weakness and dizziness. Headache with nausea and vomiting. EXAM: CT ANGIOGRAPHY HEAD AND NECK TECHNIQUE: Multidetector CT imaging of the head and neck was performed using the standard protocol during bolus administration of intravenous contrast. Multiplanar CT image reconstructions and MIPs were obtained to evaluate the vascular anatomy. Carotid stenosis measurements (when applicable) are obtained utilizing NASCET criteria, using the distal internal carotid diameter as the denominator. CONTRAST:  74mL ISOVUE-370 IOPAMIDOL (ISOVUE-370) INJECTION 76% COMPARISON:  08/19/2017 FINDINGS: CT HEAD FINDINGS Brain: There is no mass, hemorrhage or extra-axial collection. There is generalized atrophy without lobar predilection. There is hypoattenuation of the periventricular white matter, most commonly indicating chronic ischemic microangiopathy. Skull: The visualized skull base, calvarium and extracranial soft tissues are normal. Sinuses/Orbits: No fluid levels or advanced mucosal thickening of the visualized paranasal sinuses. No mastoid or middle ear effusion. The orbits are  normal. CTA NECK FINDINGS SKELETON: There is no bony spinal canal stenosis. No lytic or blastic lesion. OTHER NECK: Normal pharynx, larynx and major salivary glands. No cervical lymphadenopathy. Unremarkable thyroid gland. UPPER CHEST: No pneumothorax or pleural effusion. No nodules or masses. AORTIC ARCH: There is mild calcific atherosclerosis of the aortic arch. There is no aneurysm, dissection or hemodynamically significant stenosis of the visualized ascending aorta and aortic arch. Conventional 3 vessel aortic branching pattern. The visualized proximal subclavian arteries are widely patent. RIGHT CAROTID SYSTEM: --Common carotid artery: Widely patent origin without common carotid artery dissection or aneurysm. --Internal carotid artery: No dissection, occlusion or aneurysm. Mild atherosclerotic calcification at the carotid bifurcation without hemodynamically significant stenosis. --External carotid artery: No acute abnormality. LEFT CAROTID SYSTEM: --Common carotid artery: Widely patent origin without common carotid artery dissection or aneurysm. --Internal carotid artery: No dissection, occlusion or aneurysm. There is predominantly calcified atherosclerosis extending  into the proximal ICA, resulting in less than 50% stenosis. --External carotid artery: No acute abnormality. VERTEBRAL ARTERIES: Codominant configuration. Both origins are normal. No dissection, occlusion or flow-limiting stenosis to the vertebrobasilar confluence. CTA HEAD FINDINGS ANTERIOR CIRCULATION: --Intracranial internal carotid arteries: Atherosclerotic calcification of the internal carotid arteries at the skull base without hemodynamically significant stenosis. --Anterior cerebral arteries: Normal. Both A1 segments are present. Patent anterior communicating artery. --Middle cerebral arteries: Normal. --Posterior communicating arteries: Present on the left, absent on the right. POSTERIOR CIRCULATION: --Basilar artery: Normal. --Posterior  cerebral arteries: Normal right. Fetal origin of the left. Mild stenosis of the proximal left P2 segment. --Superior cerebellar arteries: Normal. --Inferior cerebellar arteries: Normal anterior and posterior inferior cerebellar arteries. VENOUS SINUSES: As permitted by contrast timing, patent. ANATOMIC VARIANTS: Fetal origin of the left posterior cerebral artery. This is a normal anatomic variant. DELAYED PHASE: No parenchymal contrast enhancement. Review of the MIP images confirms the above findings. IMPRESSION: 1. No emergent large vessel occlusion or high-grade stenosis. 2. Bilateral carotid bifurcation atherosclerosis without hemodynamically significant stenosis. 3. Mild atrophy and chronic microvascular disease. Aortic atherosclerosis (ICD10-I70.0). Electronically Signed   By: Ulyses Jarred M.D.   On: 10/20/2018 17:58   Dg Chest 2 View  Result Date: 10/20/2018 CLINICAL DATA:  Generalized weakness, hypoxia, chills EXAM: CHEST - 2 VIEW COMPARISON:  10/24/2017 FINDINGS: Cardiomegaly. Large hiatal hernia. No confluent airspace opacities or effusions. No acute bony abnormality. IMPRESSION: Cardiomegaly, hiatal hernia.  No active disease. Electronically Signed   By: Rolm Baptise M.D.   On: 10/20/2018 16:31   Ct Angio Neck W And/or Wo Contrast  Result Date: 10/20/2018 CLINICAL DATA:  Altered mental status. Weakness and dizziness. Headache with nausea and vomiting. EXAM: CT ANGIOGRAPHY HEAD AND NECK TECHNIQUE: Multidetector CT imaging of the head and neck was performed using the standard protocol during bolus administration of intravenous contrast. Multiplanar CT image reconstructions and MIPs were obtained to evaluate the vascular anatomy. Carotid stenosis measurements (when applicable) are obtained utilizing NASCET criteria, using the distal internal carotid diameter as the denominator. CONTRAST:  70mL ISOVUE-370 IOPAMIDOL (ISOVUE-370) INJECTION 76% COMPARISON:  08/19/2017 FINDINGS: CT HEAD FINDINGS Brain:  There is no mass, hemorrhage or extra-axial collection. There is generalized atrophy without lobar predilection. There is hypoattenuation of the periventricular white matter, most commonly indicating chronic ischemic microangiopathy. Skull: The visualized skull base, calvarium and extracranial soft tissues are normal. Sinuses/Orbits: No fluid levels or advanced mucosal thickening of the visualized paranasal sinuses. No mastoid or middle ear effusion. The orbits are normal. CTA NECK FINDINGS SKELETON: There is no bony spinal canal stenosis. No lytic or blastic lesion. OTHER NECK: Normal pharynx, larynx and major salivary glands. No cervical lymphadenopathy. Unremarkable thyroid gland. UPPER CHEST: No pneumothorax or pleural effusion. No nodules or masses. AORTIC ARCH: There is mild calcific atherosclerosis of the aortic arch. There is no aneurysm, dissection or hemodynamically significant stenosis of the visualized ascending aorta and aortic arch. Conventional 3 vessel aortic branching pattern. The visualized proximal subclavian arteries are widely patent. RIGHT CAROTID SYSTEM: --Common carotid artery: Widely patent origin without common carotid artery dissection or aneurysm. --Internal carotid artery: No dissection, occlusion or aneurysm. Mild atherosclerotic calcification at the carotid bifurcation without hemodynamically significant stenosis. --External carotid artery: No acute abnormality. LEFT CAROTID SYSTEM: --Common carotid artery: Widely patent origin without common carotid artery dissection or aneurysm. --Internal carotid artery: No dissection, occlusion or aneurysm. There is predominantly calcified atherosclerosis extending into the proximal ICA, resulting in less than 50% stenosis. --  External carotid artery: No acute abnormality. VERTEBRAL ARTERIES: Codominant configuration. Both origins are normal. No dissection, occlusion or flow-limiting stenosis to the vertebrobasilar confluence. CTA HEAD FINDINGS  ANTERIOR CIRCULATION: --Intracranial internal carotid arteries: Atherosclerotic calcification of the internal carotid arteries at the skull base without hemodynamically significant stenosis. --Anterior cerebral arteries: Normal. Both A1 segments are present. Patent anterior communicating artery. --Middle cerebral arteries: Normal. --Posterior communicating arteries: Present on the left, absent on the right. POSTERIOR CIRCULATION: --Basilar artery: Normal. --Posterior cerebral arteries: Normal right. Fetal origin of the left. Mild stenosis of the proximal left P2 segment. --Superior cerebellar arteries: Normal. --Inferior cerebellar arteries: Normal anterior and posterior inferior cerebellar arteries. VENOUS SINUSES: As permitted by contrast timing, patent. ANATOMIC VARIANTS: Fetal origin of the left posterior cerebral artery. This is a normal anatomic variant. DELAYED PHASE: No parenchymal contrast enhancement. Review of the MIP images confirms the above findings. IMPRESSION: 1. No emergent large vessel occlusion or high-grade stenosis. 2. Bilateral carotid bifurcation atherosclerosis without hemodynamically significant stenosis. 3. Mild atrophy and chronic microvascular disease. Aortic atherosclerosis (ICD10-I70.0). Electronically Signed   By: Ulyses Jarred M.D.   On: 10/20/2018 17:58   Ct Angio Chest Pe W Or Wo Contrast  Addendum Date: 10/21/2018   ADDENDUM REPORT: 10/21/2018 13:32 ADDENDUM: Critical Value/emergent results were called by telephone at the time of interpretation on 10/21/2018 at 1:31 pm to Dr. Skipper Cliche , who verbally acknowledged these results. Electronically Signed   By: Misty Stanley M.D.   On: 10/21/2018 13:32   Result Date: 10/21/2018 CLINICAL DATA:  Weakness and dizziness. EXAM: CT ANGIOGRAPHY CHEST WITH CONTRAST TECHNIQUE: Multidetector CT imaging of the chest was performed using the standard protocol during bolus administration of intravenous contrast. Multiplanar CT image  reconstructions and MIPs were obtained to evaluate the vascular anatomy. CONTRAST:  88mL ISOVUE-370 IOPAMIDOL (ISOVUE-370) INJECTION 76% COMPARISON:  None. FINDINGS: Cardiovascular: Heart is enlarged. Coronary artery calcification is evident. Atherosclerotic calcification is noted in the wall of the thoracic aorta. Acute pulmonary embolus is identified in segmental branches to the left upper lobe (159/7 and sagittal image 78/9). No additional filling defects are identified in the pulmonary arteries to suggest additional pulmonary embolus. Mediastinum/Nodes: No mediastinal lymphadenopathy. There is no hilar lymphadenopathy. Moderate to large hiatal hernia. There is no axillary lymphadenopathy. Lungs/Pleura: Areas of interstitial and patchy ground-glass attenuation are seen in the lungs bilaterally. With volume loss in the lower lobes. No overt pulmonary edema. No substantial pleural effusion. Upper Abdomen: Unremarkable. Musculoskeletal: No worrisome lytic or sclerotic osseous abnormality. Review of the MIP images confirms the above findings. IMPRESSION: 1. Small acute pulmonary embolus involving a segmental branch to the left upper lobe. No other pulmonary embolic disease evident. 2. Patchy ground-glass attenuation bilaterally. Air trapping would be a consideration. 3. Areas of volume loss/atelectasis in the lower lungs bilaterally. 4.  Aortic Atherosclerois (ICD10-170.0) Electronically Signed: By: Misty Stanley M.D. On: 10/21/2018 13:25   Ct C-spine No Charge  Result Date: 10/20/2018 CLINICAL DATA:  84 year old female with altered mental status, neck pain, generalized weakness, dizziness. EXAM: CT CERVICAL SPINE WITH CONTRAST TECHNIQUE: Multiplanar CT images of the cervical spine were reconstructed from contemporary CTA of the Neck. CONTRAST:  No additional COMPARISON:  CTA head and neck today reported separately. Cervical spine CT 08/19/2017. FINDINGS: Alignment: Stable since 2018. Straightening and mild  reversal of cervical lordosis with mild degenerative appearing anterolisthesis of C3 on C4 and C7 on T1. Bilateral posterior element alignment is within normal limits. Skull base and vertebrae: Visualized  skull base is intact. No atlanto-occipital dissociation. No acute osseous abnormality identified in the cervical spine. Soft tissues and spinal canal: No prevertebral fluid or swelling. No visible canal hematoma. Arterial and other neck soft tissue findings are reported on the CTA today separately. Disc levels: Progressed since 2018 and severe right side C1-C2 joint space loss. Advanced disc and endplate degeneration except at C2-C3 and C7-T1. Advanced facet degeneration throughout the left cervical spine. Possible developing posterior element ankylosis on the left at C3-C4, while ankylosis has developed since 2018 on the left at C4-C5. Mild chronic spinal stenosis suspected C3-C4 through C5-C6. Upper chest: Visible upper thoracic levels appear grossly intact. Partially visible advanced right shoulder degeneration. Mild nonspecific upper lung mosaic attenuation, probably gas trapping. Other upper chest findings reported separately on the CTA today. Other: Severe TMJ degeneration greater on the right. IMPRESSION: 1. No acute osseous abnormality in the cervical spine. 2. Advanced cervical spine degeneration with progression since 2018. - severe right C1-C2 joint space loss. - degenerative ankylosis is developing at the left posterior elements of C3 through C5. - multilevel mild spinal stenosis. 3. CTA head and neck today are reported separately. Electronically Signed   By: Genevie Ann M.D.   On: 10/20/2018 17:57   Vas [place] Lower Extremity Venous (dvt)  Result Date: 10/22/2018  Lower Venous Study Indications: History of DVT, and Pain.  Limitations: Body habitus and Pain. Performing Technologist: Rudell Cobb  Examination Guidelines: A complete evaluation includes B-mode imaging, spectral Doppler, color Doppler, and  power Doppler as needed of all accessible portions of each vessel. Bilateral testing is considered an integral part of a complete examination. Limited examinations for reoccurring indications may be performed as noted.  Right Venous Findings: +---------+---------------+---------+-----------+----------------------+-------+          CompressibilityPhasicitySpontaneityProperties            Summary +---------+---------------+---------+-----------+----------------------+-------+ CFV      None                    Yes        partially             Acute                                               re-cannalized                 +---------+---------------+---------+-----------+----------------------+-------+ SFJ      None                                                     Acute   +---------+---------------+---------+-----------+----------------------+-------+ FV Prox  None                                                     Acute   +---------+---------------+---------+-----------+----------------------+-------+ FV Mid   Full                                                             +---------+---------------+---------+-----------+----------------------+-------+  FV DistalPartial                                                  Acute   +---------+---------------+---------+-----------+----------------------+-------+ PFV      Partial                                                  Acute   +---------+---------------+---------+-----------+----------------------+-------+ POP      Full           Yes      Yes                                      +---------+---------------+---------+-----------+----------------------+-------+ PTV      Partial                 No                               Acute   +---------+---------------+---------+-----------+----------------------+-------+ PERO     Partial                 No                               Acute    +---------+---------------+---------+-----------+----------------------+-------+ Right external iliac vein: heterogenous material in the lumen of vessel with doppler demonstrates patency, IVC patency examed with limited visibility.  Left Venous Findings: +---------+---------------+---------+-----------+---------------+--------------+          CompressibilityPhasicitySpontaneityProperties     Summary        +---------+---------------+---------+-----------+---------------+--------------+ CFV      Full           Yes      Yes                                      +---------+---------------+---------+-----------+---------------+--------------+ SFJ      Full                                                             +---------+---------------+---------+-----------+---------------+--------------+ FV Prox  Full                                                             +---------+---------------+---------+-----------+---------------+--------------+ FV Mid   Full                                              dual system    +---------+---------------+---------+-----------+---------------+--------------+ FV Distal  Yes                       not well                                                                  visualized                                                                with                                                                      compression    +---------+---------------+---------+-----------+---------------+--------------+ PFV      Full                                                             +---------+---------------+---------+-----------+---------------+--------------+ POP      Full           Yes      Yes                                      +---------+---------------+---------+-----------+---------------+--------------+ PTV      Partial                            partially      Acute                                                       re-cannalized                 +---------+---------------+---------+-----------+---------------+--------------+ PERO     Partial                            partially      Acute                                                      re-cannalized                 +---------+---------------+---------+-----------+---------------+--------------+    Summary: Right: Findings consistent with acute deep vein thrombosis involving the right common femoral  vein, right femoral vein, right posterior tibial vein, and right peroneal vein. Right external iliac vein examed: heterogenous material in the lumen of vessel with doppler demonstrates patency, IVC patency examed with limited visibility. No cystic structure found in the popliteal fossa. Left: Findings consistent with acute deep vein thrombosis involving the left posterior tibial vein, and left peroneal vein. No cystic structure found in the popliteal fossa.  *See table(s) above for measurements and observations. Electronically signed by Harold Barban MD on 10/22/2018 at 1:50:01 PM.    Final      CBC Recent Labs  Lab 10/20/18 1643  WBC 8.2  HGB 13.1  HCT 42.8  PLT 220  MCV 102.6*  MCH 31.4  MCHC 30.6  RDW 13.4  LYMPHSABS 1.2  MONOABS 0.2  EOSABS 0.0  BASOSABS 0.0    Chemistries  Recent Labs  Lab 10/20/18 1423 10/20/18 1520 10/22/18 0543 10/23/18 0409 10/24/18 0317 10/25/18 0408 10/26/18 0445  NA 142  --  142 141 141 142 139  K 3.6  --  2.6* 3.8 3.4* 4.0 3.6  CL 102  --  99 101 99 99 96*  CO2 25  --  32 32 32 32 32  GLUCOSE 142*  --  95 107* 103* 107* 92  BUN 19  --  11 13 16 20 22   CREATININE 0.79  --  0.76 0.96 0.80 0.92 1.09*  CALCIUM 8.9  --  8.5* 8.2* 8.7* 8.9 9.1  MG  --  1.9  --  1.7  --   --   --   AST 31  --   --   --   --   --   --   ALT 13  --   --   --   --   --   --   ALKPHOS 59  --   --   --   --   --   --   BILITOT 1.9*  --   --   --   --   --   --     ------------------------------------------------------------------------------------------------------------------ No results for input(s): CHOL, HDL, LDLCALC, TRIG, CHOLHDL, LDLDIRECT in the last 72 hours.  Lab Results  Component Value Date   HGBA1C 6.4 (H) 05/30/2011  ----------------------------------------------------------------------------------------------------------------    Component Value Date/Time   BNP 136.5 (H) 10/20/2018 1950    Roxan Hockey M.D on 10/26/2018 at 7:11 PM  Pager---[phone] Go to [email or web address] - [email or web address] for contact info  [facility] - Office  [phone]

## 2018-10-27 ENCOUNTER — Other Ambulatory Visit: Payer: Self-pay | Admitting: Internal Medicine

## 2018-10-27 DIAGNOSIS — G250 Essential tremor: Secondary | ICD-10-CM | POA: Diagnosis not present

## 2018-10-27 DIAGNOSIS — R0902 Hypoxemia: Secondary | ICD-10-CM | POA: Diagnosis not present

## 2018-10-27 DIAGNOSIS — I69828 Other speech and language deficits following other cerebrovascular disease: Secondary | ICD-10-CM | POA: Diagnosis not present

## 2018-10-27 DIAGNOSIS — I5032 Chronic diastolic (congestive) heart failure: Secondary | ICD-10-CM | POA: Diagnosis not present

## 2018-10-27 DIAGNOSIS — J9601 Acute respiratory failure with hypoxia: Secondary | ICD-10-CM | POA: Diagnosis not present

## 2018-10-27 DIAGNOSIS — K219 Gastro-esophageal reflux disease without esophagitis: Secondary | ICD-10-CM | POA: Diagnosis not present

## 2018-10-27 DIAGNOSIS — N189 Chronic kidney disease, unspecified: Secondary | ICD-10-CM | POA: Diagnosis not present

## 2018-10-27 DIAGNOSIS — I82443 Acute embolism and thrombosis of tibial vein, bilateral: Secondary | ICD-10-CM | POA: Diagnosis not present

## 2018-10-27 DIAGNOSIS — Z7401 Bed confinement status: Secondary | ICD-10-CM | POA: Diagnosis not present

## 2018-10-27 DIAGNOSIS — M069 Rheumatoid arthritis, unspecified: Secondary | ICD-10-CM | POA: Diagnosis not present

## 2018-10-27 DIAGNOSIS — M542 Cervicalgia: Secondary | ICD-10-CM | POA: Diagnosis not present

## 2018-10-27 DIAGNOSIS — E876 Hypokalemia: Secondary | ICD-10-CM | POA: Diagnosis not present

## 2018-10-27 DIAGNOSIS — I2699 Other pulmonary embolism without acute cor pulmonale: Secondary | ICD-10-CM | POA: Diagnosis not present

## 2018-10-27 DIAGNOSIS — G629 Polyneuropathy, unspecified: Secondary | ICD-10-CM | POA: Diagnosis not present

## 2018-10-27 DIAGNOSIS — R531 Weakness: Secondary | ICD-10-CM | POA: Diagnosis not present

## 2018-10-27 DIAGNOSIS — M2550 Pain in unspecified joint: Secondary | ICD-10-CM | POA: Diagnosis not present

## 2018-10-27 DIAGNOSIS — M6281 Muscle weakness (generalized): Secondary | ICD-10-CM | POA: Diagnosis not present

## 2018-10-27 DIAGNOSIS — I82411 Acute embolism and thrombosis of right femoral vein: Secondary | ICD-10-CM | POA: Diagnosis not present

## 2018-10-27 DIAGNOSIS — R2681 Unsteadiness on feet: Secondary | ICD-10-CM | POA: Diagnosis not present

## 2018-10-27 DIAGNOSIS — R262 Difficulty in walking, not elsewhere classified: Secondary | ICD-10-CM | POA: Diagnosis not present

## 2018-10-27 DIAGNOSIS — I129 Hypertensive chronic kidney disease with stage 1 through stage 4 chronic kidney disease, or unspecified chronic kidney disease: Secondary | ICD-10-CM | POA: Diagnosis not present

## 2018-10-27 DIAGNOSIS — M24411 Recurrent dislocation, right shoulder: Secondary | ICD-10-CM | POA: Diagnosis not present

## 2018-10-27 DIAGNOSIS — R41841 Cognitive communication deficit: Secondary | ICD-10-CM | POA: Diagnosis not present

## 2018-10-27 DIAGNOSIS — I480 Paroxysmal atrial fibrillation: Secondary | ICD-10-CM | POA: Diagnosis not present

## 2018-10-27 DIAGNOSIS — R1314 Dysphagia, pharyngoesophageal phase: Secondary | ICD-10-CM | POA: Diagnosis not present

## 2018-10-27 MED ORDER — OXYCODONE HCL 5 MG PO TABS: 5.0000 mg | ORAL_TABLET | Freq: Four times a day (QID) | ORAL | 0 refills | Status: DC | PRN

## 2018-10-27 MED ORDER — ONDANSETRON HCL 4 MG PO TABS: 4.0000 mg | ORAL_TABLET | Freq: Four times a day (QID) | ORAL | 0 refills | Status: DC | PRN

## 2018-10-27 MED ORDER — APIXABAN 5 MG PO TABS: 5.0000 mg | ORAL_TABLET | Freq: Two times a day (BID) | ORAL | Status: DC

## 2018-10-27 MED ORDER — APIXABAN 5 MG PO TABS: 5.0000 mg | ORAL_TABLET | Freq: Two times a day (BID) | ORAL | 5 refills | Status: DC

## 2018-10-27 MED ORDER — PANTOPRAZOLE SODIUM 40 MG PO TBEC: 40.0000 mg | DELAYED_RELEASE_TABLET | Freq: Every day | ORAL | 1 refills | Status: DC

## 2018-10-27 NOTE — Discharge Instructions (Signed)
1) you are taking Eliquis/apixaban which is a blood thinner for blood clot in your lungs so Avoid ibuprofen/Advil/Aleve/Motrin/Goody Powders/Naproxen/BC powders/Meloxicam/Diclofenac/Indomethacin and other Nonsteroidal anti-inflammatory medications as these will make you more likely to bleed and can cause stomach ulcers, can also cause Kidney problems.     Information on my medicine - ELIQUIS (apixaban)  This medication education was reviewed with me or my healthcare representative as part of my discharge preparation.   Why was Eliquis prescribed for you? Eliquis was prescribed to treat blood clots that may have been found in the veins of your legs (deep vein thrombosis) or in your lungs (pulmonary embolism) and to reduce the risk of them occurring again.  What do You need to know about Eliquis ? The starting dose is 10 mg (two 5 mg tablets) taken TWICE daily for the FIRST SEVEN (7) DAYS, then on 10/29/2018  the dose is reduced to ONE 5 mg tablet taken TWICE daily.  Eliquis may be taken with or without food.   Try to take the dose about the same time in the morning and in the evening. If you have difficulty swallowing the tablet whole please discuss with your pharmacist how to take the medication safely.  Take Eliquis exactly as prescribed and DO NOT stop taking Eliquis without talking to the doctor who prescribed the medication.  Stopping may increase your risk of developing a new blood clot.  Refill your prescription before you run out.  After discharge, you should have regular check-up appointments with your healthcare provider that is prescribing your Eliquis.    What do you do if you miss a dose? If a dose of ELIQUIS is not taken at the scheduled time, take it as soon as possible on the same day and twice-daily administration should be resumed. The dose should not be doubled to make up for a missed dose.  Important Safety Information A possible side effect of Eliquis is bleeding.  You should call your healthcare provider right away if you experience any of the following: ? Bleeding from an injury or your nose that does not stop. ? Unusual colored urine (red or dark brown) or unusual colored stools (red or black). ? Unusual bruising for unknown reasons. ? A serious fall or if you hit your head (even if there is no bleeding).  Some medicines may interact with Eliquis and might increase your risk of bleeding or clotting while on Eliquis. To help avoid this, consult your healthcare provider or pharmacist prior to using any new prescription or non-prescription medications, including herbals, vitamins, non-steroidal anti-inflammatory drugs (NSAIDs) and supplements.  This website has more information on Eliquis (apixaban): http://www.eliquis.com/eliquis/home  1) you are taking Eliquis/apixaban which is a blood thinner for blood clot in your lungs so Avoid ibuprofen/Advil/Aleve/Motrin/Goody Powders/Naproxen/BC powders/Meloxicam/Diclofenac/Indomethacin and other Nonsteroidal anti-inflammatory medications as these will make you more likely to bleed and can cause stomach ulcers, can also cause Kidney problems.

## 2018-10-27 NOTE — Progress Notes (Signed)
Patient will DC to: [facility] Anticipated DC date: 10/27/18 Family notified: Son, Merry Proud Transport by: Corey Harold   Per MD patient ready for DC to [facility]. RN, patient, patient's family, and facility notified of DC. Discharge Summary and FL2 sent to facility. RN to call report prior to discharge [phone]). DC packet on chart. Ambulance transport requested for patient.   CSW will sign off for now as social work intervention is no longer needed. Please consult [place] again if new needs arise.  Cedric Fishman, LCSW Clinical Social Worker [phone]

## 2018-10-27 NOTE — Clinical Social Work Placement (Signed)
   CLINICAL SOCIAL WORK PLACEMENT  NOTE  Date:  10/27/2018  Patient Details  Name: Dominique Mckee MRN: [id number] Date of Birth: [birth year]  Clinical Social Work is seeking post-discharge placement for this patient at the [place] level of care (*CSW will initial, date and re-position this form in  chart as items are completed):  Yes   Patient/family provided with [facility] Department's list of facilities offering this level of care within the geographic area requested by the patient (or if unable, by the patient's family).  Yes   Patient/family informed of their freedom to choose among providers that offer the needed level of care, that participate in Medicare, Medicaid or managed care program needed by the patient, have an available bed and are willing to accept the patient.  Yes   Patient/family informed of [facility]'s ownership interest in [facility] and [facility], as well as of the fact that they are under no obligation to receive care at these facilities.  PASRR submitted to EDS on       PASRR number received on       Existing PASRR number confirmed on 10/23/18     FL2 transmitted to all facilities in geographic area requested by pt/family on 10/23/18     FL2 transmitted to all facilities within larger geographic area on       Patient informed that his/her managed care company has contracts with or will negotiate with certain facilities, including the following:        Yes   Patient/family informed of bed offers received.  Patient chooses bed at [facility]     Physician recommends and patient chooses bed at      Patient to be transferred to [facility] on 10/27/18.  Patient to be transferred to facility by [facility]     Patient family notified on 10/27/18 of transfer.  Name of family member notified:  Mosetta Anis     PHYSICIAN Please prepare priority discharge summary, including  medications     Additional Comment:    _______________________________________________ Benard Halsted, LCSW 10/27/2018, 10:06 AM

## 2018-10-27 NOTE — Progress Notes (Signed)
Patient was discharged to nursing home [facility]) by MD order; discharged instructions review and sent to facility via [facility] with care notes and prescriptions; IV DIC; facility was called for report but nobody was able to take report for the patient; writer sent the phone number with [facility] for the nurse to call for report; patient will be transported to facility via [place].

## 2018-10-27 NOTE — Discharge Summary (Signed)
Dominique Mckee, is a 84 y.o. female  DOB September 02, 1930  MRN [id number].  Admission date:  10/20/2018  Admitting Physician  Etta Quill, DO  Discharge Date:  10/27/2018   Primary MD  Wenda Low, MD  Recommendations for primary care physician for things to follow:   1) you are taking Eliquis/apixaban which is a blood thinner for blood clot in your lungs so Avoid ibuprofen/Advil/Aleve/Motrin/Goody Powders/Naproxen/BC powders/Meloxicam/Diclofenac/Indomethacin and other Nonsteroidal anti-inflammatory medications as these will make you more likely to bleed and can cause stomach ulcers, can also cause Kidney problems.   Admission Diagnosis  Neck pain [M54.2] Hypoxia [R09.02]   Discharge Diagnosis  Neck pain [M54.2] Hypoxia [R09.02]   Principal Problem:   Hypoxia Active Problems:   Rheumatoid arthritis (HCC)   Nausea and vomiting   Chronic diastolic CHF (congestive heart failure) ([place])   Palliative care by specialist   DNR (do not resuscitate)   Neck pain   Weakness generalized      Past Medical History:  Diagnosis Date  . Arthritis   . Benign essential tremor   . Cervical spondylosis   . Cervicogenic headache   . CHF (congestive heart failure) ([place])   . Chronic renal insufficiency   . Degenerative disc disease   . Depression   . Fibromyalgia   . Gait disorder   . GERD (gastroesophageal reflux disease)   . Heart murmur   . HOH (hard of hearing)   . Hypertension   . Lumbar spondylosis   . Migraine headache   . Mild obesity   . Osteopenia   . Rheumatoid arthritis [facility])     Past Surgical History:  Procedure Laterality Date  . ABDOMINAL HYSTERECTOMY    . APPENDECTOMY    . CHOLECYSTECTOMY    . COLONOSCOPY    . ELBOW SURGERY Left   . HAMMER TOE SURGERY Left   . LUMBAR FUSION  02/2017  . SHOULDER CLOSED REDUCTION Right 07/08/2015   Procedure: RIGHT SHOULDER CLOSED REDUCTION;   Surgeon: Meredith Pel, MD;  Location: [place];  Service: Orthopedics;  Laterality: Right;  . SHOULDER CLOSED REDUCTION Left 08/23/2017   Procedure: CLOSED REDUCTION LEFT SHOULDER;  Surgeon: Meredith Pel, MD;  Location: [place];  Service: Orthopedics;  Laterality: Left;  . TONSILLECTOMY    . TOTAL KNEE ARTHROPLASTY Right    right       HPI  from the history and physical done on the day of admission:   Chief Complaint: N/V, dizziness  HPI: Dominique Mckee is a 84 y.o. female with medical history significant of diastolic CHF, RA.  Patient presents to ED today with c/o headache / neck pain, fatigue, N/V.  Headache and neck pain over past 4 days, R sided, severe, some dizziness.  Had N/V today.  Seemed to onset after falling asleep with "neck bent to the right" a couple of days ago per son.  Was going to follow up with cardiology today due to recent diagnosis of PAF, but sent in to ED  for symptoms.   ED Course: In the ED symptoms improved with treatment and believed to be musculoskeletal; however, patient was noted to be significantly hypoxic on RA with O2 sats in the mid to low 80s.  Improved on 3L via [place].  CXR neg.   Hospital Course:   Brief Summary 84 y.o.femalewith medical history significant ofdiastolic CHF, RA admitted on 10/21/2018 with acute hypoxia and found to have acute bilateral DVT and PE,    Plan 1)Acute bilateral DVT and PE-----CTA chest and venous Dopplers report noted, echo with preserved EF of 65%, no intracardiac thrombus, no pulmonary hypertension, continue Eliquis for full anticoagulation, some dyspnea on exertion persist, hypoxia resolved, patient may need lifelong anticoagulation  2)PAFib--stable, TSH is 0.78 , continue propranolol 20 mg p.o. twice daily for rate control, patient already on Eliquis for PE  3)RA--- stable, patient with severe debilitating rheumatoid arthritis, continue PRN oxycodone, continue methotrexate 25 mg every Thursday,  continue folic acid, avoid NSAIDs while on Eliquis  4)HFpEF--- echo this admission with EF of 60 to 65%, clinically appears euvolemic and compensated, BNP  is 136 , continue Lasix 20 mg daily  5)HAs/Neck Pain-CTA head and neck without acute findings, CT of C-spine without acute findings, headache and neck pain as well as dizziness has resolved at this time,.  Continue physical therapy  6)Recurrent hypokalemia--- replace, recheck potassium and magnesium, most likely due to Lasix use, patient will need daily potassium supplementation while on Lasix  7)Acute hypoxic respiratory failure--- secondary to PE as above #1, hypoxia has mostly resolved  8)Generalized weakness/debility--- PT recommended SNF, patient and family/son are  agreeable to go to SNF rehab, patient continues to have difficulties with mobility related ADLs  Code Status : Full  Family Communication:   None at bedside   Disposition Plan  : SNF  Rehab  DVT Prophylaxis  : Eliquis  Discharge Condition: stable  Follow UP   Contact information for follow-up providers    [facility] Follow up.   Specialty:  [place] Why:  home health services arranged Contact information: [address] [place] [address] [phone]            Contact information for after-discharge care    Destination    [facility] Preferred SNF .   Service:  Skilled Nursing Contact information: [address] [place] [phone]                   Diet and Activity recommendation:  As advised  Discharge Instructions    Discharge Instructions    Call MD for:  difficulty breathing, headache or visual disturbances   Complete by:  As directed    Call MD for:  persistant dizziness or light-headedness   Complete by:  As directed    Call MD for:  persistant nausea and vomiting   Complete by:  As directed    Call MD for:  severe  uncontrolled pain   Complete by:  As directed    Call MD for:  temperature >100.4   Complete by:  As directed    Diet - low sodium heart healthy   Complete by:  As directed    Discharge instructions   Complete by:  As directed    1) you are taking Eliquis/apixaban which is a blood thinner for blood clot in your lungs so Avoid ibuprofen/Advil/Aleve/Motrin/Goody Powders/Naproxen/BC powders/Meloxicam/Diclofenac/Indomethacin and other Nonsteroidal anti-inflammatory medications as these will make you more likely  to bleed and can cause stomach ulcers, can also cause Kidney problems.   Walk with assistance   Complete by:  As directed         Discharge Medications     Allergies as of 10/27/2018      Reactions   Feldene [piroxicam]    Kidney issues   Clinoril [sulindac] Rash   Morphine And Related Nausea And Vomiting      Medication List    TAKE these medications   acetaminophen 500 MG tablet Commonly known as:  TYLENOL Take 500 mg by mouth every 6 (six) hours as needed for mild pain.   apixaban 5 MG Tabs tablet Commonly known as:  ELIQUIS Take 1 tablet (5 mg total) by mouth 2 (two) times daily.   CALCIUM 600+D3 600-200 MG-UNIT Tabs Generic drug:  Calcium Carb-Cholecalciferol Take 1 tablet by mouth daily.   calcium carbonate 500 MG chewable tablet Commonly known as:  TUMS - dosed in mg elemental calcium Chew 1 tablet by mouth 2 (two) times daily as needed for indigestion.   diclofenac sodium 1 % Gel Commonly known as:  VOLTAREN Apply 1 application topically 2 (two) times daily as needed (pain).   DULoxetine 30 MG capsule Commonly known as:  CYMBALTA Take 30 mg by mouth daily.   ferrous sulfate 325 (65 FE) MG tablet Take 325 mg by mouth every evening.   folic acid 1 MG tablet Commonly known as:  FOLVITE Take 1 mg by mouth daily.   furosemide 20 MG tablet Commonly known as:  LASIX Take 20 mg by mouth daily.   gabapentin 300 MG capsule Commonly known as:   NEURONTIN Take 900 mg by mouth at bedtime.   methotrexate 2.5 MG tablet Commonly known as:  RHEUMATREX Take 25 mg by mouth once a week. Caution:Chemotherapy. Protect from light. THURSDAYS   ondansetron 4 MG tablet Commonly known as:  ZOFRAN Take 1 tablet (4 mg total) by mouth every 6 (six) hours as needed for nausea.   oxyCODONE 5 MG immediate release tablet Commonly known as:  ROXICODONE Take 1 tablet (5 mg total) by mouth every 6 (six) hours as needed for moderate pain or severe pain. What changed:  reasons to take this   pantoprazole 40 MG tablet Commonly known as:  PROTONIX Take 1 tablet (40 mg total) by mouth daily. What changed:    when to take this  reasons to take this   propranolol 20 MG tablet Commonly known as:  INDERAL Take 1 tablet (20 mg total) by mouth 2 (two) times daily.       Major procedures and Radiology Reports - PLEASE review detailed and final reports for all details, in brief -   Ct Angio Head W Or Wo Contrast  Result Date: 10/20/2018 CLINICAL DATA:  Altered mental status. Weakness and dizziness. Headache with nausea and vomiting. EXAM: CT ANGIOGRAPHY HEAD AND NECK TECHNIQUE: Multidetector CT imaging of the head and neck was performed using the standard protocol during bolus administration of intravenous contrast. Multiplanar CT image reconstructions and MIPs were obtained to evaluate the vascular anatomy. Carotid stenosis measurements (when applicable) are obtained utilizing NASCET criteria, using the distal internal carotid diameter as the denominator. CONTRAST:  41mL ISOVUE-370 IOPAMIDOL (ISOVUE-370) INJECTION 76% COMPARISON:  08/19/2017 FINDINGS: CT HEAD FINDINGS Brain: There is no mass, hemorrhage or extra-axial collection. There is generalized atrophy without lobar predilection. There is hypoattenuation of the periventricular white matter, most commonly indicating chronic ischemic microangiopathy. Skull: The visualized skull base,  calvarium and  extracranial soft tissues are normal. Sinuses/Orbits: No fluid levels or advanced mucosal thickening of the visualized paranasal sinuses. No mastoid or middle ear effusion. The orbits are normal. CTA NECK FINDINGS SKELETON: There is no bony spinal canal stenosis. No lytic or blastic lesion. OTHER NECK: Normal pharynx, larynx and major salivary glands. No cervical lymphadenopathy. Unremarkable thyroid gland. UPPER CHEST: No pneumothorax or pleural effusion. No nodules or masses. AORTIC ARCH: There is mild calcific atherosclerosis of the aortic arch. There is no aneurysm, dissection or hemodynamically significant stenosis of the visualized ascending aorta and aortic arch. Conventional 3 vessel aortic branching pattern. The visualized proximal subclavian arteries are widely patent. RIGHT CAROTID SYSTEM: --Common carotid artery: Widely patent origin without common carotid artery dissection or aneurysm. --Internal carotid artery: No dissection, occlusion or aneurysm. Mild atherosclerotic calcification at the carotid bifurcation without hemodynamically significant stenosis. --External carotid artery: No acute abnormality. LEFT CAROTID SYSTEM: --Common carotid artery: Widely patent origin without common carotid artery dissection or aneurysm. --Internal carotid artery: No dissection, occlusion or aneurysm. There is predominantly calcified atherosclerosis extending into the proximal ICA, resulting in less than 50% stenosis. --External carotid artery: No acute abnormality. VERTEBRAL ARTERIES: Codominant configuration. Both origins are normal. No dissection, occlusion or flow-limiting stenosis to the vertebrobasilar confluence. CTA HEAD FINDINGS ANTERIOR CIRCULATION: --Intracranial internal carotid arteries: Atherosclerotic calcification of the internal carotid arteries at the skull base without hemodynamically significant stenosis. --Anterior cerebral arteries: Normal. Both A1 segments are present. Patent anterior  communicating artery. --Middle cerebral arteries: Normal. --Posterior communicating arteries: Present on the left, absent on the right. POSTERIOR CIRCULATION: --Basilar artery: Normal. --Posterior cerebral arteries: Normal right. Fetal origin of the left. Mild stenosis of the proximal left P2 segment. --Superior cerebellar arteries: Normal. --Inferior cerebellar arteries: Normal anterior and posterior inferior cerebellar arteries. VENOUS SINUSES: As permitted by contrast timing, patent. ANATOMIC VARIANTS: Fetal origin of the left posterior cerebral artery. This is a normal anatomic variant. DELAYED PHASE: No parenchymal contrast enhancement. Review of the MIP images confirms the above findings. IMPRESSION: 1. No emergent large vessel occlusion or high-grade stenosis. 2. Bilateral carotid bifurcation atherosclerosis without hemodynamically significant stenosis. 3. Mild atrophy and chronic microvascular disease. Aortic atherosclerosis (ICD10-I70.0). Electronically Signed   By: Ulyses Jarred M.D.   On: 10/20/2018 17:58   Dg Chest 2 View  Result Date: 10/20/2018 CLINICAL DATA:  Generalized weakness, hypoxia, chills EXAM: CHEST - 2 VIEW COMPARISON:  10/24/2017 FINDINGS: Cardiomegaly. Large hiatal hernia. No confluent airspace opacities or effusions. No acute bony abnormality. IMPRESSION: Cardiomegaly, hiatal hernia.  No active disease. Electronically Signed   By: Rolm Baptise M.D.   On: 10/20/2018 16:31   Ct Angio Neck W And/or Wo Contrast  Result Date: 10/20/2018 CLINICAL DATA:  Altered mental status. Weakness and dizziness. Headache with nausea and vomiting. EXAM: CT ANGIOGRAPHY HEAD AND NECK TECHNIQUE: Multidetector CT imaging of the head and neck was performed using the standard protocol during bolus administration of intravenous contrast. Multiplanar CT image reconstructions and MIPs were obtained to evaluate the vascular anatomy. Carotid stenosis measurements (when applicable) are obtained utilizing  NASCET criteria, using the distal internal carotid diameter as the denominator. CONTRAST:  37mL ISOVUE-370 IOPAMIDOL (ISOVUE-370) INJECTION 76% COMPARISON:  08/19/2017 FINDINGS: CT HEAD FINDINGS Brain: There is no mass, hemorrhage or extra-axial collection. There is generalized atrophy without lobar predilection. There is hypoattenuation of the periventricular white matter, most commonly indicating chronic ischemic microangiopathy. Skull: The visualized skull base, calvarium and extracranial soft tissues are normal. Sinuses/Orbits: No  fluid levels or advanced mucosal thickening of the visualized paranasal sinuses. No mastoid or middle ear effusion. The orbits are normal. CTA NECK FINDINGS SKELETON: There is no bony spinal canal stenosis. No lytic or blastic lesion. OTHER NECK: Normal pharynx, larynx and major salivary glands. No cervical lymphadenopathy. Unremarkable thyroid gland. UPPER CHEST: No pneumothorax or pleural effusion. No nodules or masses. AORTIC ARCH: There is mild calcific atherosclerosis of the aortic arch. There is no aneurysm, dissection or hemodynamically significant stenosis of the visualized ascending aorta and aortic arch. Conventional 3 vessel aortic branching pattern. The visualized proximal subclavian arteries are widely patent. RIGHT CAROTID SYSTEM: --Common carotid artery: Widely patent origin without common carotid artery dissection or aneurysm. --Internal carotid artery: No dissection, occlusion or aneurysm. Mild atherosclerotic calcification at the carotid bifurcation without hemodynamically significant stenosis. --External carotid artery: No acute abnormality. LEFT CAROTID SYSTEM: --Common carotid artery: Widely patent origin without common carotid artery dissection or aneurysm. --Internal carotid artery: No dissection, occlusion or aneurysm. There is predominantly calcified atherosclerosis extending into the proximal ICA, resulting in less than 50% stenosis. --External carotid artery:  No acute abnormality. VERTEBRAL ARTERIES: Codominant configuration. Both origins are normal. No dissection, occlusion or flow-limiting stenosis to the vertebrobasilar confluence. CTA HEAD FINDINGS ANTERIOR CIRCULATION: --Intracranial internal carotid arteries: Atherosclerotic calcification of the internal carotid arteries at the skull base without hemodynamically significant stenosis. --Anterior cerebral arteries: Normal. Both A1 segments are present. Patent anterior communicating artery. --Middle cerebral arteries: Normal. --Posterior communicating arteries: Present on the left, absent on the right. POSTERIOR CIRCULATION: --Basilar artery: Normal. --Posterior cerebral arteries: Normal right. Fetal origin of the left. Mild stenosis of the proximal left P2 segment. --Superior cerebellar arteries: Normal. --Inferior cerebellar arteries: Normal anterior and posterior inferior cerebellar arteries. VENOUS SINUSES: As permitted by contrast timing, patent. ANATOMIC VARIANTS: Fetal origin of the left posterior cerebral artery. This is a normal anatomic variant. DELAYED PHASE: No parenchymal contrast enhancement. Review of the MIP images confirms the above findings. IMPRESSION: 1. No emergent large vessel occlusion or high-grade stenosis. 2. Bilateral carotid bifurcation atherosclerosis without hemodynamically significant stenosis. 3. Mild atrophy and chronic microvascular disease. Aortic atherosclerosis (ICD10-I70.0). Electronically Signed   By: Ulyses Jarred M.D.   On: 10/20/2018 17:58   Ct Angio Chest Pe W Or Wo Contrast  Addendum Date: 10/21/2018   ADDENDUM REPORT: 10/21/2018 13:32 ADDENDUM: Critical Value/emergent results were called by telephone at the time of interpretation on 10/21/2018 at 1:31 pm to Dr. Skipper Cliche , who verbally acknowledged these results. Electronically Signed   By: Misty Stanley M.D.   On: 10/21/2018 13:32   Result Date: 10/21/2018 CLINICAL DATA:  Weakness and dizziness. EXAM: CT  ANGIOGRAPHY CHEST WITH CONTRAST TECHNIQUE: Multidetector CT imaging of the chest was performed using the standard protocol during bolus administration of intravenous contrast. Multiplanar CT image reconstructions and MIPs were obtained to evaluate the vascular anatomy. CONTRAST:  43mL ISOVUE-370 IOPAMIDOL (ISOVUE-370) INJECTION 76% COMPARISON:  None. FINDINGS: Cardiovascular: Heart is enlarged. Coronary artery calcification is evident. Atherosclerotic calcification is noted in the wall of the thoracic aorta. Acute pulmonary embolus is identified in segmental branches to the left upper lobe (159/7 and sagittal image 78/9). No additional filling defects are identified in the pulmonary arteries to suggest additional pulmonary embolus. Mediastinum/Nodes: No mediastinal lymphadenopathy. There is no hilar lymphadenopathy. Moderate to large hiatal hernia. There is no axillary lymphadenopathy. Lungs/Pleura: Areas of interstitial and patchy ground-glass attenuation are seen in the lungs bilaterally. With volume loss in the lower lobes. No  overt pulmonary edema. No substantial pleural effusion. Upper Abdomen: Unremarkable. Musculoskeletal: No worrisome lytic or sclerotic osseous abnormality. Review of the MIP images confirms the above findings. IMPRESSION: 1. Small acute pulmonary embolus involving a segmental branch to the left upper lobe. No other pulmonary embolic disease evident. 2. Patchy ground-glass attenuation bilaterally. Air trapping would be a consideration. 3. Areas of volume loss/atelectasis in the lower lungs bilaterally. 4.  Aortic Atherosclerois (ICD10-170.0) Electronically Signed: By: Misty Stanley M.D. On: 10/21/2018 13:25   Ct C-spine No Charge  Result Date: 10/20/2018 CLINICAL DATA:  84 year old female with altered mental status, neck pain, generalized weakness, dizziness. EXAM: CT CERVICAL SPINE WITH CONTRAST TECHNIQUE: Multiplanar CT images of the cervical spine were reconstructed from contemporary  CTA of the Neck. CONTRAST:  No additional COMPARISON:  CTA head and neck today reported separately. Cervical spine CT 08/19/2017. FINDINGS: Alignment: Stable since 2018. Straightening and mild reversal of cervical lordosis with mild degenerative appearing anterolisthesis of C3 on C4 and C7 on T1. Bilateral posterior element alignment is within normal limits. Skull base and vertebrae: Visualized skull base is intact. No atlanto-occipital dissociation. No acute osseous abnormality identified in the cervical spine. Soft tissues and spinal canal: No prevertebral fluid or swelling. No visible canal hematoma. Arterial and other neck soft tissue findings are reported on the CTA today separately. Disc levels: Progressed since 2018 and severe right side C1-C2 joint space loss. Advanced disc and endplate degeneration except at C2-C3 and C7-T1. Advanced facet degeneration throughout the left cervical spine. Possible developing posterior element ankylosis on the left at C3-C4, while ankylosis has developed since 2018 on the left at C4-C5. Mild chronic spinal stenosis suspected C3-C4 through C5-C6. Upper chest: Visible upper thoracic levels appear grossly intact. Partially visible advanced right shoulder degeneration. Mild nonspecific upper lung mosaic attenuation, probably gas trapping. Other upper chest findings reported separately on the CTA today. Other: Severe TMJ degeneration greater on the right. IMPRESSION: 1. No acute osseous abnormality in the cervical spine. 2. Advanced cervical spine degeneration with progression since 2018. - severe right C1-C2 joint space loss. - degenerative ankylosis is developing at the left posterior elements of C3 through C5. - multilevel mild spinal stenosis. 3. CTA head and neck today are reported separately. Electronically Signed   By: Genevie Ann M.D.   On: 10/20/2018 17:57   Vas [place] Lower Extremity Venous (dvt)  Result Date: 10/22/2018  Lower Venous Study Indications: History of DVT, and  Pain.  Limitations: Body habitus and Pain. Performing Technologist: Rudell Cobb  Examination Guidelines: A complete evaluation includes B-mode imaging, spectral Doppler, color Doppler, and power Doppler as needed of all accessible portions of each vessel. Bilateral testing is considered an integral part of a complete examination. Limited examinations for reoccurring indications may be performed as noted.  Right Venous Findings: +---------+---------------+---------+-----------+----------------------+-------+          CompressibilityPhasicitySpontaneityProperties            Summary +---------+---------------+---------+-----------+----------------------+-------+ CFV      None                    Yes        partially             Acute  re-cannalized                 +---------+---------------+---------+-----------+----------------------+-------+ SFJ      None                                                     Acute   +---------+---------------+---------+-----------+----------------------+-------+ FV Prox  None                                                     Acute   +---------+---------------+---------+-----------+----------------------+-------+ FV Mid   Full                                                             +---------+---------------+---------+-----------+----------------------+-------+ FV DistalPartial                                                  Acute   +---------+---------------+---------+-----------+----------------------+-------+ PFV      Partial                                                  Acute   +---------+---------------+---------+-----------+----------------------+-------+ POP      Full           Yes      Yes                                      +---------+---------------+---------+-----------+----------------------+-------+ PTV      Partial                 No                                Acute   +---------+---------------+---------+-----------+----------------------+-------+ PERO     Partial                 No                               Acute   +---------+---------------+---------+-----------+----------------------+-------+ Right external iliac vein: heterogenous material in the lumen of vessel with doppler demonstrates patency, IVC patency examed with limited visibility.  Left Venous Findings: +---------+---------------+---------+-----------+---------------+--------------+          CompressibilityPhasicitySpontaneityProperties     Summary        +---------+---------------+---------+-----------+---------------+--------------+ CFV      Full           Yes      Yes                                      +---------+---------------+---------+-----------+---------------+--------------+  SFJ      Full                                                             +---------+---------------+---------+-----------+---------------+--------------+ FV Prox  Full                                                             +---------+---------------+---------+-----------+---------------+--------------+ FV Mid   Full                                              dual system    +---------+---------------+---------+-----------+---------------+--------------+ FV Distal                        Yes                       not well                                                                  visualized                                                                with                                                                      compression    +---------+---------------+---------+-----------+---------------+--------------+ PFV      Full                                                             +---------+---------------+---------+-----------+---------------+--------------+ POP      Full           Yes      Yes                                       +---------+---------------+---------+-----------+---------------+--------------+ PTV      Partial  partially      Acute                                                      re-cannalized                 +---------+---------------+---------+-----------+---------------+--------------+ PERO     Partial                            partially      Acute                                                      re-cannalized                 +---------+---------------+---------+-----------+---------------+--------------+    Summary: Right: Findings consistent with acute deep vein thrombosis involving the right common femoral vein, right femoral vein, right posterior tibial vein, and right peroneal vein. Right external iliac vein examed: heterogenous material in the lumen of vessel with doppler demonstrates patency, IVC patency examed with limited visibility. No cystic structure found in the popliteal fossa. Left: Findings consistent with acute deep vein thrombosis involving the left posterior tibial vein, and left peroneal vein. No cystic structure found in the popliteal fossa.  *See table(s) above for measurements and observations. Electronically signed by Harold Barban MD on 10/22/2018 at 1:50:01 PM.    Final     Micro Results   No results found for this or any previous visit (from the past 240 hour(s)).     Today   Subjective    [name] today has no new concerns, some dyspnea on exertion persist, eating and drinking well,          Patient has been seen and examined prior to discharge   Objective   Blood pressure 134/70, pulse (!) 59, temperature 98.7 F (37.1 C), temperature source Oral, resp. rate 16, height 5\' 3"  (1.6 m), weight 71.3 kg, SpO2 95 %.   Intake/Output Summary (Last 24 hours) at 10/27/2018 1217 Last data filed at 10/27/2018 0905 Gross per 24 hour  Intake 556 ml  Output -  Net 556 ml     Exam Gen:- Awake Alert,  In no apparent distress  HEENT:- [place].AT, No sclera icterus Neck-Supple Neck,No JVD,.  Lungs-improving air movement, no rhonchi or rails CV- S1, S2 normal, 3/6 SM,  regular (history of A. fib but currently regular) Abd-  +ve B.Sounds, Abd Soft, No tenderness,    Extremity/Skin:- trace  edema, pedal pulses present  Psych-affect is appropriate, oriented x3 Neuro-generalized weakness, but no new focal deficits, no tremors   Data Review   CBC w Diff:  Lab Results  Component Value Date   WBC 8.2 10/20/2018   HGB 13.1 10/20/2018   HCT 42.8 10/20/2018   PLT 220 10/20/2018   LYMPHOPCT 15 10/20/2018   MONOPCT 2 10/20/2018   EOSPCT 0 10/20/2018   BASOPCT 0 10/20/2018    CMP:  Lab Results  Component Value Date   NA 139 10/26/2018   K 3.6 10/26/2018   CL 96 (L) 10/26/2018   CO2 32 10/26/2018   BUN 22 10/26/2018  CREATININE 1.09 (H) 10/26/2018   PROT 7.5 10/20/2018   ALBUMIN 3.6 10/20/2018   BILITOT 1.9 (H) 10/20/2018   ALKPHOS 59 10/20/2018   AST 31 10/20/2018   ALT 13 10/20/2018  .   Total Discharge time is about 33 minutes  Roxan Hockey M.D on 10/27/2018 at 12:17 PM  Pager---[phone]  Go to [email or web address] - password [email or web address] for contact info  [facility] - Office  [phone]

## 2018-10-27 NOTE — Progress Notes (Signed)
Patient has been approved for discharge to [facility] today. Auth: Cleary LCSW [id number]

## 2018-10-28 ENCOUNTER — Non-Acute Institutional Stay (SKILLED_NURSING_FACILITY): Payer: PPO | Admitting: Internal Medicine

## 2018-10-28 ENCOUNTER — Encounter: Payer: Self-pay | Admitting: Internal Medicine

## 2018-10-28 DIAGNOSIS — G629 Polyneuropathy, unspecified: Secondary | ICD-10-CM | POA: Diagnosis not present

## 2018-10-28 DIAGNOSIS — M542 Cervicalgia: Secondary | ICD-10-CM | POA: Diagnosis not present

## 2018-10-28 DIAGNOSIS — M069 Rheumatoid arthritis, unspecified: Secondary | ICD-10-CM

## 2018-10-28 DIAGNOSIS — E876 Hypokalemia: Secondary | ICD-10-CM | POA: Diagnosis not present

## 2018-10-28 DIAGNOSIS — I480 Paroxysmal atrial fibrillation: Secondary | ICD-10-CM | POA: Diagnosis not present

## 2018-10-28 DIAGNOSIS — K219 Gastro-esophageal reflux disease without esophagitis: Secondary | ICD-10-CM | POA: Diagnosis not present

## 2018-10-28 DIAGNOSIS — I82411 Acute embolism and thrombosis of right femoral vein: Secondary | ICD-10-CM

## 2018-10-28 DIAGNOSIS — J9601 Acute respiratory failure with hypoxia: Secondary | ICD-10-CM

## 2018-10-28 DIAGNOSIS — I82443 Acute embolism and thrombosis of tibial vein, bilateral: Secondary | ICD-10-CM | POA: Diagnosis not present

## 2018-10-28 NOTE — Progress Notes (Signed)
:  Location:  [place] Room Number: 993Z Place of Service:  SNF (31)  Nazareth Kirk D. Sheppard Coil, MD  Patient Care Team: Wenda Low, MD as PCP - General (Internal Medicine) Gavin Pound, MD as Consulting Physician (Rheumatology)  Extended Emergency Contact Information Primary Emergency Contact: Harris,Jeff Address: [address]          [place], [place] [address] Johnnette Litter of [place] Phone: [phone] Relation: Son Secondary Emergency Contact: Dondra Prader [place] of [place] Phone: [phone] Relation: Granddaughter     Allergies: Feldene [piroxicam]; Clinoril [sulindac]; and Morphine and related  Chief Complaint  Patient presents with  . New Admit To SNF    Admit to [facility]    HPI: Patient is 84 y.o. female with diastolic congestive heart failure, rheumatoid arthritis, who presented to the emergency department on the day of admission complaining of headache and neck pain some fatigue and nausea and vomiting.  The headache and neck pain started 4 days prior was right-sided and severe with some dizziness that had seemed to onset after falling asleep with the neck bent to the right.  Patient was going to follow-up with cardiology day of admission for recent diagnosis of PAF but was sent to ED for symptoms.  In the ED was felt like neck symptoms were musculoskeletal, however patient was noted to be hypoxic on room air with O2 sats in the mid low 80s which improved with 3 L via nasal cannula.  Chest patient chest x-ray was negative but patient was found to have acute bilateral DVT and PE.  Patient was admitted to [facility] from 12/20 3-30 for acute respiratory failure with hypoxia and bilateral DVT and PE.  Patient was started on Eliquis.  Hospital course was complicated by hypokalemia and hypomagnesemia which were repleted.  Patient was admitted to SNF for OT/PT.  While at skilled nursing facility patient will be  followed for rheumatoid arthritis treated with methotrexate and Voltaren gel, diastolic congestive heart failure treated with propranolol and Lasix and polyneuropathy treated with Neurontin.  Past Medical History:  Diagnosis Date  . Arthritis   . Benign essential tremor   . Cervical spondylosis   . Cervicogenic headache   . CHF (congestive heart failure) ([place])   . Chronic renal insufficiency   . Degenerative disc disease   . Depression   . Fibromyalgia   . Gait disorder   . GERD (gastroesophageal reflux disease)   . Heart murmur   . HOH (hard of hearing)   . Hypertension   . Lumbar spondylosis   . Migraine headache   . Mild obesity   . Osteopenia   . Rheumatoid arthritis [facility])     Past Surgical History:  Procedure Laterality Date  . ABDOMINAL HYSTERECTOMY    . APPENDECTOMY    . CHOLECYSTECTOMY    . COLONOSCOPY    . ELBOW SURGERY Left   . HAMMER TOE SURGERY Left   . LUMBAR FUSION  02/2017  . SHOULDER CLOSED REDUCTION Right 07/08/2015   Procedure: RIGHT SHOULDER CLOSED REDUCTION;  Surgeon: Meredith Pel, MD;  Location: [facility];  Service: Orthopedics;  Laterality: Right;  . SHOULDER CLOSED REDUCTION Left 08/23/2017   Procedure: CLOSED REDUCTION LEFT SHOULDER;  Surgeon: Meredith Pel, MD;  Location: [facility];  Service: Orthopedics;  Laterality: Left;  . TONSILLECTOMY    . TOTAL KNEE ARTHROPLASTY Right    right    Allergies as of 10/28/2018  Reactions   Feldene [piroxicam]    Kidney issues   Clinoril [sulindac] Rash   Morphine And Related Nausea And Vomiting      Medication List       Accurate as of October 28, 2018 10:57 AM. Always use your most recent med list.        acetaminophen 500 MG tablet Commonly known as:  TYLENOL Take 500 mg by mouth every 6 (six) hours as needed for mild pain.   apixaban 5 MG Tabs tablet Commonly known as:  ELIQUIS Take 1 tablet (5 mg total) by mouth 2 (two) times daily.   CALCIUM 600+D3 600-200 MG-UNIT Tabs Generic  drug:  Calcium Carb-Cholecalciferol Take 1 tablet by mouth daily.   calcium carbonate 500 MG chewable tablet Commonly known as:  TUMS - dosed in mg elemental calcium Chew 1 tablet by mouth 2 (two) times daily as needed for indigestion.   diclofenac sodium 1 % Gel Commonly known as:  VOLTAREN Apply 1 application topically 2 (two) times daily as needed (pain).   DULoxetine 30 MG capsule Commonly known as:  CYMBALTA Take 30 mg by mouth daily.   ferrous sulfate 325 (65 FE) MG tablet Take 325 mg by mouth every evening.   folic acid 1 MG tablet Commonly known as:  FOLVITE Take 1 mg by mouth daily.   furosemide 20 MG tablet Commonly known as:  LASIX Take 20 mg by mouth daily.   gabapentin 300 MG capsule Commonly known as:  NEURONTIN Take 900 mg by mouth at bedtime.   methotrexate 2.5 MG tablet Commonly known as:  RHEUMATREX Take 25 mg by mouth once a week. Caution:Chemotherapy. Protect from light. THURSDAYS   ondansetron 4 MG tablet Commonly known as:  ZOFRAN Take 1 tablet (4 mg total) by mouth every 6 (six) hours as needed for nausea.   oxyCODONE 5 MG immediate release tablet Commonly known as:  ROXICODONE Take 1 tablet (5 mg total) by mouth every 6 (six) hours as needed for moderate pain or severe pain (for 14 days).   pantoprazole 40 MG tablet Commonly known as:  PROTONIX Take 1 tablet (40 mg total) by mouth daily.   propranolol 20 MG tablet Commonly known as:  INDERAL Take 1 tablet (20 mg total) by mouth 2 (two) times daily.       No orders of the defined types were placed in this encounter.   Immunization History  Administered Date(s) Administered  . Influenza,inj,Quad PF,6+ Mos 07/10/2015    Social History   Tobacco Use  . Smoking status: Never Smoker  . Smokeless tobacco: Never Used  Substance Use Topics  . Alcohol use: No    Family history is   Family History  Problem Relation Age of Onset  . Osteoporosis Mother   . Intracerebral hemorrhage  Mother 17  . AAA (abdominal aortic aneurysm) Father 65  . Lung cancer Brother   . Diabetes Brother   . Lung cancer Brother   . Diabetes Sister   . Lung cancer Sister   . Bone cancer Sister   . Kidney failure Son        left kidney transplant  . Diabetes type I Daughter   . Anesthesia problems Neg Hx   . Hypertension Neg Hx       Review of Systems  DATA OBTAINED: from patient, nurse GENERAL:  no fevers, fatigue, appetite changes SKIN: No itching, or rash EYES: No eye pain, redness, discharge EARS: No earache, tinnitus, change in hearing  NOSE: No congestion, drainage or bleeding  MOUTH/THROAT: No mouth or tooth pain, No sore throat RESPIRATORY: No cough, wheezing, SOB CARDIAC: No chest pain, palpitations, lower extremity edema  GI: No abdominal pain, No N/V/D or constipation, No heartburn or reflux  GU: No dysuria, frequency or urgency, or incontinence  MUSCULOSKELETAL: No unrelieved bone/joint pain NEUROLOGIC: No headache, dizziness or focal weakness PSYCHIATRIC: No c/o anxiety or sadness   Vitals:   10/28/18 1055  BP: 120/67  Pulse: (!) 54  Resp: 18  Temp: (!) 97.3 F (36.3 C)    SpO2 Readings from Last 1 Encounters:  10/27/18 93%   Body mass index is 27.91 kg/m.     Physical Exam  GENERAL APPEARANCE: Alert, conversant,  No acute distress.  SKIN: No diaphoresis rash HEAD: Normocephalic, atraumatic  EYES: Conjunctiva/lids clear. Pupils round, reactive. EOMs intact.  EARS: External exam WNL, canals clear. Hearing grossly normal.  NOSE: No deformity or discharge.  MOUTH/THROAT: Lips w/o lesions  RESPIRATORY: Breathing is even, unlabored. Lung sounds are clear   CARDIOVASCULAR: Heart RRR no murmurs, rubs or gallops. No peripheral edema.   GASTROINTESTINAL: Abdomen is soft, non-tender, not distended w/ normal bowel sounds. GENITOURINARY: Bladder non tender, not distended  MUSCULOSKELETAL: No abnormal joints or musculature NEUROLOGIC:  Cranial nerves 2-12  grossly intact. Moves all extremities  PSYCHIATRIC: Mood and affect appropriate to situation, no behavioral issues  Patient Active Problem List   Diagnosis Date Noted  . Weakness generalized   . Palliative care by specialist   . DNR (do not resuscitate)   . Neck pain   . Hypoxia 10/21/2018  . Nausea and vomiting 10/21/2018  . Chronic diastolic CHF (congestive heart failure) ([place]) 10/21/2018  . Status post lumbar spinal fusion 04/13/2017  . Degenerative lumbar spinal stenosis 03/15/2017  . CKD (chronic kidney disease) 01/11/2017  . Anemia 01/10/2017  . Chronic pain 01/10/2017  . Heart murmur 01/10/2017  . Acute diastolic congestive heart failure ([place]) 07/08/2015  . Acute respiratory failure with hypoxia ([place]) 07/08/2015  . Shoulder dislocation, recurrent 07/06/2015  . HTN (hypertension) 07/06/2015  . Rheumatoid arthritis ([place]) 07/06/2015  . GERD (gastroesophageal reflux disease) 07/06/2015  . Shoulder dislocation   . Concussion with no loss of consciousness 12/30/2013  . Cervical spondylosis without myelopathy 01/01/2013  . Migraine without aura 01/01/2013  . Abnormality of gait 01/01/2013  . Essential and other specified forms of tremor 01/01/2013      Labs reviewed: Basic Metabolic Panel:    Component Value Date/Time   NA 139 10/26/2018 0445   K 3.6 10/26/2018 0445   CL 96 (L) 10/26/2018 0445   CO2 32 10/26/2018 0445   GLUCOSE 92 10/26/2018 0445   BUN 22 10/26/2018 0445   CREATININE 1.09 (H) 10/26/2018 0445   CALCIUM 9.1 10/26/2018 0445   PROT 7.5 10/20/2018 1423   ALBUMIN 3.6 10/20/2018 1423   AST 31 10/20/2018 1423   ALT 13 10/20/2018 1423   ALKPHOS 59 10/20/2018 1423   BILITOT 1.9 (H) 10/20/2018 1423   GFRNONAA 46 (L) 10/26/2018 0445   GFRAA 53 (L) 10/26/2018 0445    Recent Labs    10/20/18 1520  10/23/18 0409 10/24/18 0317 10/25/18 0408 10/26/18 0445  NA  --    < > 141 141 142 139  K  --    < > 3.8 3.4* 4.0 3.6  CL  --    < > 101 99 99 96*  CO2   --    < > 32 32  32 32  GLUCOSE  --    < > 107* 103* 107* 92  BUN  --    < > 13 16 20 22   CREATININE  --    < > 0.96 0.80 0.92 1.09*  CALCIUM  --    < > 8.2* 8.7* 8.9 9.1  MG 1.9  --  1.7  --   --   --    < > = values in this interval not displayed.   Liver Function Tests: Recent Labs    10/20/18 1423  AST 31  ALT 13  ALKPHOS 59  BILITOT 1.9*  PROT 7.5  ALBUMIN 3.6   Recent Labs    10/20/18 1423  LIPASE 25   No results for input(s): AMMONIA in the last 8760 hours. CBC: Recent Labs    10/20/18 1643  WBC 8.2  NEUTROABS 6.8  HGB 13.1  HCT 42.8  MCV 102.6*  PLT 220   Lipid No results for input(s): CHOL, HDL, LDLCALC, TRIG in the last 8760 hours.  Cardiac Enzymes: No results for input(s): CKTOTAL, CKMB, CKMBINDEX, TROPONINI in the last 8760 hours. BNP: Recent Labs    10/20/18 0934  BNP 136.5*   No results found for: [facility] Lab Results  Component Value Date   HGBA1C 6.4 (H) 05/30/2011   Lab Results  Component Value Date   TSH 0.781 10/20/2018   Lab Results  Component Value Date   CBJSEGBT51 761 08/16/2014   No results found for: FOLATE Lab Results  Component Value Date   IRON 32 (L) 05/31/2011   TIBC 366 05/31/2011   FERRITIN 8 (L) 05/31/2011    Imaging and Procedures obtained prior to SNF admission: Ct Angio Head W Or Wo Contrast  Result Date: 10/20/2018 CLINICAL DATA:  Altered mental status. Weakness and dizziness. Headache with nausea and vomiting. EXAM: CT ANGIOGRAPHY HEAD AND NECK TECHNIQUE: Multidetector CT imaging of the head and neck was performed using the standard protocol during bolus administration of intravenous contrast. Multiplanar CT image reconstructions and MIPs were obtained to evaluate the vascular anatomy. Carotid stenosis measurements (when applicable) are obtained utilizing NASCET criteria, using the distal internal carotid diameter as the denominator. CONTRAST:  64mL ISOVUE-370 IOPAMIDOL (ISOVUE-370) INJECTION 76%  COMPARISON:  08/19/2017 FINDINGS: CT HEAD FINDINGS Brain: There is no mass, hemorrhage or extra-axial collection. There is generalized atrophy without lobar predilection. There is hypoattenuation of the periventricular white matter, most commonly indicating chronic ischemic microangiopathy. Skull: The visualized skull base, calvarium and extracranial soft tissues are normal. Sinuses/Orbits: No fluid levels or advanced mucosal thickening of the visualized paranasal sinuses. No mastoid or middle ear effusion. The orbits are normal. CTA NECK FINDINGS SKELETON: There is no bony spinal canal stenosis. No lytic or blastic lesion. OTHER NECK: Normal pharynx, larynx and major salivary glands. No cervical lymphadenopathy. Unremarkable thyroid gland. UPPER CHEST: No pneumothorax or pleural effusion. No nodules or masses. AORTIC ARCH: There is mild calcific atherosclerosis of the aortic arch. There is no aneurysm, dissection or hemodynamically significant stenosis of the visualized ascending aorta and aortic arch. Conventional 3 vessel aortic branching pattern. The visualized proximal subclavian arteries are widely patent. RIGHT CAROTID SYSTEM: --Common carotid artery: Widely patent origin without common carotid artery dissection or aneurysm. --Internal carotid artery: No dissection, occlusion or aneurysm. Mild atherosclerotic calcification at the carotid bifurcation without hemodynamically significant stenosis. --External carotid artery: No acute abnormality. LEFT CAROTID SYSTEM: --Common carotid artery: Widely patent origin without common carotid artery dissection or aneurysm. --Internal carotid artery: No  dissection, occlusion or aneurysm. There is predominantly calcified atherosclerosis extending into the proximal ICA, resulting in less than 50% stenosis. --External carotid artery: No acute abnormality. VERTEBRAL ARTERIES: Codominant configuration. Both origins are normal. No dissection, occlusion or flow-limiting stenosis  to the vertebrobasilar confluence. CTA HEAD FINDINGS ANTERIOR CIRCULATION: --Intracranial internal carotid arteries: Atherosclerotic calcification of the internal carotid arteries at the skull base without hemodynamically significant stenosis. --Anterior cerebral arteries: Normal. Both A1 segments are present. Patent anterior communicating artery. --Middle cerebral arteries: Normal. --Posterior communicating arteries: Present on the left, absent on the right. POSTERIOR CIRCULATION: --Basilar artery: Normal. --Posterior cerebral arteries: Normal right. Fetal origin of the left. Mild stenosis of the proximal left P2 segment. --Superior cerebellar arteries: Normal. --Inferior cerebellar arteries: Normal anterior and posterior inferior cerebellar arteries. VENOUS SINUSES: As permitted by contrast timing, patent. ANATOMIC VARIANTS: Fetal origin of the left posterior cerebral artery. This is a normal anatomic variant. DELAYED PHASE: No parenchymal contrast enhancement. Review of the MIP images confirms the above findings. IMPRESSION: 1. No emergent large vessel occlusion or high-grade stenosis. 2. Bilateral carotid bifurcation atherosclerosis without hemodynamically significant stenosis. 3. Mild atrophy and chronic microvascular disease. Aortic atherosclerosis (ICD10-I70.0). Electronically Signed   By: Ulyses Jarred M.D.   On: 10/20/2018 17:58   Dg Chest 2 View  Result Date: 10/20/2018 CLINICAL DATA:  Generalized weakness, hypoxia, chills EXAM: CHEST - 2 VIEW COMPARISON:  10/24/2017 FINDINGS: Cardiomegaly. Large hiatal hernia. No confluent airspace opacities or effusions. No acute bony abnormality. IMPRESSION: Cardiomegaly, hiatal hernia.  No active disease. Electronically Signed   By: Rolm Baptise M.D.   On: 10/20/2018 16:31   Ct Angio Neck W And/or Wo Contrast  Result Date: 10/20/2018 CLINICAL DATA:  Altered mental status. Weakness and dizziness. Headache with nausea and vomiting. EXAM: CT ANGIOGRAPHY HEAD AND  NECK TECHNIQUE: Multidetector CT imaging of the head and neck was performed using the standard protocol during bolus administration of intravenous contrast. Multiplanar CT image reconstructions and MIPs were obtained to evaluate the vascular anatomy. Carotid stenosis measurements (when applicable) are obtained utilizing NASCET criteria, using the distal internal carotid diameter as the denominator. CONTRAST:  17mL ISOVUE-370 IOPAMIDOL (ISOVUE-370) INJECTION 76% COMPARISON:  08/19/2017 FINDINGS: CT HEAD FINDINGS Brain: There is no mass, hemorrhage or extra-axial collection. There is generalized atrophy without lobar predilection. There is hypoattenuation of the periventricular white matter, most commonly indicating chronic ischemic microangiopathy. Skull: The visualized skull base, calvarium and extracranial soft tissues are normal. Sinuses/Orbits: No fluid levels or advanced mucosal thickening of the visualized paranasal sinuses. No mastoid or middle ear effusion. The orbits are normal. CTA NECK FINDINGS SKELETON: There is no bony spinal canal stenosis. No lytic or blastic lesion. OTHER NECK: Normal pharynx, larynx and major salivary glands. No cervical lymphadenopathy. Unremarkable thyroid gland. UPPER CHEST: No pneumothorax or pleural effusion. No nodules or masses. AORTIC ARCH: There is mild calcific atherosclerosis of the aortic arch. There is no aneurysm, dissection or hemodynamically significant stenosis of the visualized ascending aorta and aortic arch. Conventional 3 vessel aortic branching pattern. The visualized proximal subclavian arteries are widely patent. RIGHT CAROTID SYSTEM: --Common carotid artery: Widely patent origin without common carotid artery dissection or aneurysm. --Internal carotid artery: No dissection, occlusion or aneurysm. Mild atherosclerotic calcification at the carotid bifurcation without hemodynamically significant stenosis. --External carotid artery: No acute abnormality. LEFT  CAROTID SYSTEM: --Common carotid artery: Widely patent origin without common carotid artery dissection or aneurysm. --Internal carotid artery: No dissection, occlusion or aneurysm. There is predominantly calcified atherosclerosis  extending into the proximal ICA, resulting in less than 50% stenosis. --External carotid artery: No acute abnormality. VERTEBRAL ARTERIES: Codominant configuration. Both origins are normal. No dissection, occlusion or flow-limiting stenosis to the vertebrobasilar confluence. CTA HEAD FINDINGS ANTERIOR CIRCULATION: --Intracranial internal carotid arteries: Atherosclerotic calcification of the internal carotid arteries at the skull base without hemodynamically significant stenosis. --Anterior cerebral arteries: Normal. Both A1 segments are present. Patent anterior communicating artery. --Middle cerebral arteries: Normal. --Posterior communicating arteries: Present on the left, absent on the right. POSTERIOR CIRCULATION: --Basilar artery: Normal. --Posterior cerebral arteries: Normal right. Fetal origin of the left. Mild stenosis of the proximal left P2 segment. --Superior cerebellar arteries: Normal. --Inferior cerebellar arteries: Normal anterior and posterior inferior cerebellar arteries. VENOUS SINUSES: As permitted by contrast timing, patent. ANATOMIC VARIANTS: Fetal origin of the left posterior cerebral artery. This is a normal anatomic variant. DELAYED PHASE: No parenchymal contrast enhancement. Review of the MIP images confirms the above findings. IMPRESSION: 1. No emergent large vessel occlusion or high-grade stenosis. 2. Bilateral carotid bifurcation atherosclerosis without hemodynamically significant stenosis. 3. Mild atrophy and chronic microvascular disease. Aortic atherosclerosis (ICD10-I70.0). Electronically Signed   By: Ulyses Jarred M.D.   On: 10/20/2018 17:58   Ct Angio Chest Pe W Or Wo Contrast  Addendum Date: 10/21/2018   ADDENDUM REPORT: 10/21/2018 13:32 ADDENDUM:  Critical Value/emergent results were called by telephone at the time of interpretation on 10/21/2018 at 1:31 pm to Dr. Skipper Cliche , who verbally acknowledged these results. Electronically Signed   By: Misty Stanley M.D.   On: 10/21/2018 13:32   Result Date: 10/21/2018 CLINICAL DATA:  Weakness and dizziness. EXAM: CT ANGIOGRAPHY CHEST WITH CONTRAST TECHNIQUE: Multidetector CT imaging of the chest was performed using the standard protocol during bolus administration of intravenous contrast. Multiplanar CT image reconstructions and MIPs were obtained to evaluate the vascular anatomy. CONTRAST:  45mL ISOVUE-370 IOPAMIDOL (ISOVUE-370) INJECTION 76% COMPARISON:  None. FINDINGS: Cardiovascular: Heart is enlarged. Coronary artery calcification is evident. Atherosclerotic calcification is noted in the wall of the thoracic aorta. Acute pulmonary embolus is identified in segmental branches to the left upper lobe (159/7 and sagittal image 78/9). No additional filling defects are identified in the pulmonary arteries to suggest additional pulmonary embolus. Mediastinum/Nodes: No mediastinal lymphadenopathy. There is no hilar lymphadenopathy. Moderate to large hiatal hernia. There is no axillary lymphadenopathy. Lungs/Pleura: Areas of interstitial and patchy ground-glass attenuation are seen in the lungs bilaterally. With volume loss in the lower lobes. No overt pulmonary edema. No substantial pleural effusion. Upper Abdomen: Unremarkable. Musculoskeletal: No worrisome lytic or sclerotic osseous abnormality. Review of the MIP images confirms the above findings. IMPRESSION: 1. Small acute pulmonary embolus involving a segmental branch to the left upper lobe. No other pulmonary embolic disease evident. 2. Patchy ground-glass attenuation bilaterally. Air trapping would be a consideration. 3. Areas of volume loss/atelectasis in the lower lungs bilaterally. 4.  Aortic Atherosclerois (ICD10-170.0) Electronically Signed: By: Misty Stanley M.D. On: 10/21/2018 13:25   Ct C-spine No Charge  Result Date: 10/20/2018 CLINICAL DATA:  84 year old female with altered mental status, neck pain, generalized weakness, dizziness. EXAM: CT CERVICAL SPINE WITH CONTRAST TECHNIQUE: Multiplanar CT images of the cervical spine were reconstructed from contemporary CTA of the Neck. CONTRAST:  No additional COMPARISON:  CTA head and neck today reported separately. Cervical spine CT 08/19/2017. FINDINGS: Alignment: Stable since 2018. Straightening and mild reversal of cervical lordosis with mild degenerative appearing anterolisthesis of C3 on C4 and C7 on T1. Bilateral posterior element  alignment is within normal limits. Skull base and vertebrae: Visualized skull base is intact. No atlanto-occipital dissociation. No acute osseous abnormality identified in the cervical spine. Soft tissues and spinal canal: No prevertebral fluid or swelling. No visible canal hematoma. Arterial and other neck soft tissue findings are reported on the CTA today separately. Disc levels: Progressed since 2018 and severe right side C1-C2 joint space loss. Advanced disc and endplate degeneration except at C2-C3 and C7-T1. Advanced facet degeneration throughout the left cervical spine. Possible developing posterior element ankylosis on the left at C3-C4, while ankylosis has developed since 2018 on the left at C4-C5. Mild chronic spinal stenosis suspected C3-C4 through C5-C6. Upper chest: Visible upper thoracic levels appear grossly intact. Partially visible advanced right shoulder degeneration. Mild nonspecific upper lung mosaic attenuation, probably gas trapping. Other upper chest findings reported separately on the CTA today. Other: Severe TMJ degeneration greater on the right. IMPRESSION: 1. No acute osseous abnormality in the cervical spine. 2. Advanced cervical spine degeneration with progression since 2018. - severe right C1-C2 joint space loss. - degenerative ankylosis is  developing at the left posterior elements of C3 through C5. - multilevel mild spinal stenosis. 3. CTA head and neck today are reported separately. Electronically Signed   By: Genevie Ann M.D.   On: 10/20/2018 17:57   Vas [place] Lower Extremity Venous (dvt)  Result Date: 10/22/2018  Lower Venous Study Indications: History of DVT, and Pain.  Limitations: Body habitus and Pain. Performing Technologist: Rudell Cobb  Examination Guidelines: A complete evaluation includes B-mode imaging, spectral Doppler, color Doppler, and power Doppler as needed of all accessible portions of each vessel. Bilateral testing is considered an integral part of a complete examination. Limited examinations for reoccurring indications may be performed as noted.  Right Venous Findings: +---------+---------------+---------+-----------+----------------------+-------+          CompressibilityPhasicitySpontaneityProperties            Summary +---------+---------------+---------+-----------+----------------------+-------+ CFV      None                    Yes        partially             Acute                                               re-cannalized                 +---------+---------------+---------+-----------+----------------------+-------+ SFJ      None                                                     Acute   +---------+---------------+---------+-----------+----------------------+-------+ FV Prox  None                                                     Acute   +---------+---------------+---------+-----------+----------------------+-------+ FV Mid   Full                                                             +---------+---------------+---------+-----------+----------------------+-------+  FV DistalPartial                                                  Acute   +---------+---------------+---------+-----------+----------------------+-------+ PFV      Partial                                                   Acute   +---------+---------------+---------+-----------+----------------------+-------+ POP      Full           Yes      Yes                                      +---------+---------------+---------+-----------+----------------------+-------+ PTV      Partial                 No                               Acute   +---------+---------------+---------+-----------+----------------------+-------+ PERO     Partial                 No                               Acute   +---------+---------------+---------+-----------+----------------------+-------+ Right external iliac vein: heterogenous material in the lumen of vessel with doppler demonstrates patency, IVC patency examed with limited visibility.  Left Venous Findings: +---------+---------------+---------+-----------+---------------+--------------+          CompressibilityPhasicitySpontaneityProperties     Summary        +---------+---------------+---------+-----------+---------------+--------------+ CFV      Full           Yes      Yes                                      +---------+---------------+---------+-----------+---------------+--------------+ SFJ      Full                                                             +---------+---------------+---------+-----------+---------------+--------------+ FV Prox  Full                                                             +---------+---------------+---------+-----------+---------------+--------------+ FV Mid   Full                                              dual system    +---------+---------------+---------+-----------+---------------+--------------+ FV Distal  Yes                       not well                                                                  visualized                                                                with                                                                       compression    +---------+---------------+---------+-----------+---------------+--------------+ PFV      Full                                                             +---------+---------------+---------+-----------+---------------+--------------+ POP      Full           Yes      Yes                                      +---------+---------------+---------+-----------+---------------+--------------+ PTV      Partial                            partially      Acute                                                      re-cannalized                 +---------+---------------+---------+-----------+---------------+--------------+ PERO     Partial                            partially      Acute                                                      re-cannalized                 +---------+---------------+---------+-----------+---------------+--------------+    Summary: Right: Findings consistent with acute deep vein thrombosis involving the right common femoral  vein, right femoral vein, right posterior tibial vein, and right peroneal vein. Right external iliac vein examed: heterogenous material in the lumen of vessel with doppler demonstrates patency, IVC patency examed with limited visibility. No cystic structure found in the popliteal fossa. Left: Findings consistent with acute deep vein thrombosis involving the left posterior tibial vein, and left peroneal vein. No cystic structure found in the popliteal fossa.  *See table(s) above for measurements and observations. Electronically signed by Harold Barban MD on 10/22/2018 at 1:50:01 PM.    Final      Not all labs, radiology exams or other studies done during hospitalization come through on my EPIC note; however they are reviewed by me.    Assessment and Plan  Acute bilateral DVT/PE/acute respiratory failure with hypoxia- echo with preserved EF of 65%, no intracardiac thrombus no pulmonary  hypertension, hypoxia resolved, some dyspnea on exertion persists; patient may need lifelong anticoagulation; continue Eliquis for full anticoagulation SNF-admitted for OT/PT; continue Eliquis 5 mg twice daily  Paroxysmal atrial fibrillation-TSH 0.78 SNF- continue propranolol 20 mg twice daily for rate control, patient is prophylaxed now with Eliquis  HFpEF/diastolic congestive heart failure- EF 60 to 65%; BNP 136 SNF- continue propranolol 20 mg twice daily and Lasix 20 mg daily  RA-severe debilitating rheumatoid arthritis SNF- continue methotrexate 25 mg every Thursday, continue folic acid 1 mg daily; avoid NSAIDs while on Eliquis; continue PRN oxycodone  Neck pain- CTA head and neck without acute findings, CT of C-spine without acute findings, headache, neck pain, and dizziness have all resolved prior to discharge SNF- PT  Recurrent hypokalemia/hypomagnesemia -repleted SNF- follow-up BMP plus mag  Polyneuropathy SNF- not stated as uncontrolled; continue Neurontin 900 mg nightly  GERD SNF-not stated as uncontrolled; continue Protonix 40 mg daily    Time spent greater than 45 minutes;> 50% of time with patient was spent reviewing records, labs, tests and studies, counseling and developing plan of care  Webb Silversmith D. Sheppard Coil, MD

## 2018-10-29 ENCOUNTER — Encounter: Payer: Self-pay | Admitting: Internal Medicine

## 2018-10-29 DIAGNOSIS — I480 Paroxysmal atrial fibrillation: Secondary | ICD-10-CM | POA: Insufficient documentation

## 2018-10-29 DIAGNOSIS — E876 Hypokalemia: Secondary | ICD-10-CM | POA: Insufficient documentation

## 2018-10-29 DIAGNOSIS — I82443 Acute embolism and thrombosis of tibial vein, bilateral: Secondary | ICD-10-CM | POA: Insufficient documentation

## 2018-10-29 DIAGNOSIS — G629 Polyneuropathy, unspecified: Secondary | ICD-10-CM | POA: Insufficient documentation

## 2018-10-29 DIAGNOSIS — I82411 Acute embolism and thrombosis of right femoral vein: Secondary | ICD-10-CM | POA: Insufficient documentation

## 2018-11-06 ENCOUNTER — Other Ambulatory Visit: Payer: Self-pay | Admitting: *Deleted

## 2018-11-06 LAB — CBC AND DIFFERENTIAL
HCT: 36 (ref 36–46)
Hemoglobin: 12.2 (ref 12.0–16.0)
PLATELETS: 241 (ref 150–399)
WBC: 5.2

## 2018-11-06 LAB — BASIC METABOLIC PANEL
BUN: 24 — AB (ref 4–21)
Creatinine: 0.7 (ref 0.5–1.1)
Glucose: 92
Potassium: 3.9 (ref 3.4–5.3)
Sodium: 146 (ref 137–147)

## 2018-11-06 NOTE — Patient Outreach (Signed)
[facility]) Care Management  11/06/2018  BRYANNAH BOSTON Apr 05, 1930 [id number]   Facility site visit to [facility] to discuss patient's progress and plan for transitioning to home. Met with [name] SW and discharge planner.  [name] stated patient progressing well and has a set discharge date of 1/18.   Met with patient at the bedside.  Patient resting in bed during meeting.  Patient stated she felt she was doing better and is looking forward to going home.  [place] Management services and gave patient [facility] packet with contact information.  Patient agreeable to services but requests her son [name] be her point of contact stating he will be staying with her until he gets everything arranged.  Patient gave her son's number as [phone]. Patient stated her son arranges her transportation and has only been able to find options which were very costly. Patient requested [facility] SW contact her son to discuss affordable transportation options. Patient stated her son fills her medication box also.   Referral made to [place] community care manager and [place] to make [facility] UM Team member aware of patient's [facility] CM referral.   Ruhee Enck RN, [place] Acute Care Coordinator [phone]) Business Mobile [phone]) Toll free office

## 2018-11-10 ENCOUNTER — Other Ambulatory Visit: Payer: Self-pay | Admitting: Licensed Clinical Social Worker

## 2018-11-10 NOTE — Patient Outreach (Signed)
[facility]) Care Management  11/10/2018  SHALINA NORFOLK 1930-05-31 [id number]   [facility] CSW received the referral and request to follow patient on 11/07/18 during her short term SNF stay at [facility]. [facility] CSW arrived at [facility] and was able to locate patient in her room. [facility] CSW received HIPPA verifications from patient successfully. [facility] CSW introduced self, reason for visit and of [facility] CM Services and successfully completed PAC Consult. Patient is agreeable to [facility] and signed consent. Patient reports that she is widow and lives at home with her son who is her primary caregiver. She shares that she is excited about discharging from SNF back home on 11/15/18. She shares that her son is home with her all throughout the day and night but that he is possibly interested in hiring a caregiver. Patient reports that she was under the impression that Medicare could possibly pay for a caregiver once she discharges back home and [facility] CSW informed her that Medicare can only cover for a CNA aide to come in once or twice a week for 1 hour to assist with bathing only if order to made. Patient reports that her son prepares all of her meals. Patient shares that she is interested in available transportation options that are available to her as well as she feels that her son may want to go back to work eventually and she will need a back up source of transportation. Patient is agreeable to [place] and would like [facility] RNCM and BSW involved post SNF discharge to assist with care management needs. [facility] CSW provided patient with a [facility] Calendar and left contact information with patient. Patient shares that she will tell her son to contact this [facility] CSW in the case that they have any further questions or concerns.  Eula Fried, BSW, MSW, [email or web address] Phone: [phone] Fax: [phone]

## 2018-11-14 ENCOUNTER — Encounter: Payer: Self-pay | Admitting: Internal Medicine

## 2018-11-14 ENCOUNTER — Non-Acute Institutional Stay (SKILLED_NURSING_FACILITY): Payer: PPO | Admitting: Internal Medicine

## 2018-11-14 DIAGNOSIS — M542 Cervicalgia: Secondary | ICD-10-CM

## 2018-11-14 DIAGNOSIS — M069 Rheumatoid arthritis, unspecified: Secondary | ICD-10-CM

## 2018-11-14 DIAGNOSIS — G629 Polyneuropathy, unspecified: Secondary | ICD-10-CM | POA: Diagnosis not present

## 2018-11-14 DIAGNOSIS — K219 Gastro-esophageal reflux disease without esophagitis: Secondary | ICD-10-CM

## 2018-11-14 DIAGNOSIS — I82443 Acute embolism and thrombosis of tibial vein, bilateral: Secondary | ICD-10-CM | POA: Diagnosis not present

## 2018-11-14 DIAGNOSIS — I2699 Other pulmonary embolism without acute cor pulmonale: Secondary | ICD-10-CM | POA: Diagnosis not present

## 2018-11-14 DIAGNOSIS — I82411 Acute embolism and thrombosis of right femoral vein: Secondary | ICD-10-CM | POA: Diagnosis not present

## 2018-11-14 DIAGNOSIS — I480 Paroxysmal atrial fibrillation: Secondary | ICD-10-CM

## 2018-11-14 DIAGNOSIS — E876 Hypokalemia: Secondary | ICD-10-CM | POA: Diagnosis not present

## 2018-11-14 DIAGNOSIS — J9601 Acute respiratory failure with hypoxia: Secondary | ICD-10-CM | POA: Diagnosis not present

## 2018-11-14 DIAGNOSIS — I5032 Chronic diastolic (congestive) heart failure: Secondary | ICD-10-CM

## 2018-11-14 NOTE — Progress Notes (Signed)
Location:  [place] Room Number: 833A Place of Service:  SNF (31)  Noah Delaine. Sheppard Coil, MD  Patient Care Team: Wenda Low, MD as PCP - General (Internal Medicine) Gavin Pound, MD as Consulting Physician (Rheumatology) Tobi Bastos, RN as [place] [facility]  Extended Emergency Contact Information Primary Emergency Contact: Harris,Jeff Address: [address]          [place], [place] [address] Johnnette Litter of [place] Phone: [phone] Relation: Son Secondary Emergency Contact: Dondra Prader [place] of [place] Phone: [phone] Relation: Granddaughter  Allergies  Allergen Reactions  . Feldene [Piroxicam]     Kidney issues  . Clinoril [Sulindac] Rash  . Morphine And Related Nausea And Vomiting    Chief Complaint  Patient presents with  . Discharge Note    Discharge from [facility]    HPI:  85 y.o. female with diastolic congestive heart failure, rheumatoid arthritis, who presented to the emergency department on the day of admission complaining of headache and neck pain with some fatigue nausea and vomiting.  The headache and neck pain has started 4 days prior, was right-sided and severe with some dizziness that seemed to onset after falling asleep with the neck bent to the right.  Patient was going to follow-up with cardiology the day of admission for recent diagnosis of PAF but was sent to the ED for symptoms.  In the ED it was felt the neck symptoms were musculoskeletal however patient was noted to be hypoxic on room air with O2 sats in the low 80s which improved with 3 L via nasal cannula.  Patient chest x-ray was negative but patient was found to have acute bilateral DVT and PE.  Patient was admitted to [facility] from 12/20 3-30 for acute respiratory failure with hypoxia and bilateral DVT and PE.  Patient was started on Eliquis.  Hospital course was complicated by hypokalemia and  hypomagnesemia which were repleted.  Patient was admitted to SNF for OT/PT and is now ready to be discharged to home.    Past Medical History:  Diagnosis Date  . Arthritis   . Benign essential tremor   . Cervical spondylosis   . Cervicogenic headache   . CHF (congestive heart failure) ([place])   . Chronic renal insufficiency   . Degenerative disc disease   . Depression   . Fibromyalgia   . Gait disorder   . GERD (gastroesophageal reflux disease)   . Heart murmur   . HOH (hard of hearing)   . Hypertension   . Lumbar spondylosis   . Migraine headache   . Mild obesity   . Osteopenia   . Rheumatoid arthritis [facility])     Past Surgical History:  Procedure Laterality Date  . ABDOMINAL HYSTERECTOMY    . APPENDECTOMY    . CHOLECYSTECTOMY    . COLONOSCOPY    . ELBOW SURGERY Left   . HAMMER TOE SURGERY Left   . LUMBAR FUSION  02/2017  . SHOULDER CLOSED REDUCTION Right 07/08/2015   Procedure: RIGHT SHOULDER CLOSED REDUCTION;  Surgeon: Meredith Pel, MD;  Location: [place];  Service: Orthopedics;  Laterality: Right;  . SHOULDER CLOSED REDUCTION Left 08/23/2017   Procedure: CLOSED REDUCTION LEFT SHOULDER;  Surgeon: Meredith Pel, MD;  Location: [place];  Service: Orthopedics;  Laterality: Left;  . TONSILLECTOMY    . TOTAL KNEE ARTHROPLASTY Right    right     reports that she has  never smoked. She has never used smokeless tobacco. She reports that she does not drink alcohol or use drugs. Social History   Socioeconomic History  . Marital status: Widowed    Spouse name: Not on file  . Number of children: 2  . Years of education: 44  . Highest education level: Not on file  Occupational History  . Occupation: Retired    Fish farm manager: RETIRED  Social Needs  . Financial resource strain: Not on file  . Food insecurity:    Worry: Not on file    Inability: Not on file  . Transportation needs:    Medical: Not on file    Non-medical: Not on file  Tobacco Use  . Smoking status:  Never Smoker  . Smokeless tobacco: Never Used  Substance and Sexual Activity  . Alcohol use: No  . Drug use: No  . Sexual activity: Not Currently  Lifestyle  . Physical activity:    Days per week: Not on file    Minutes per session: Not on file  . Stress: Not on file  Relationships  . Social connections:    Talks on phone: Not on file    Gets together: Not on file    Attends religious service: Not on file    Active member of club or organization: Not on file    Attends meetings of clubs or organizations: Not on file    Relationship status: Not on file  . Intimate partner violence:    Fear of current or ex partner: Not on file    Emotionally abused: Not on file    Physically abused: Not on file    Forced sexual activity: Not on file  Other Topics Concern  . Not on file  Social History Narrative   Patient is right handed.   Patient drinks very little caffeine.    Pertinent  Health Maintenance Due  Topic Date Due  . PNA vac Low Risk Adult (1 of 2 - PCV13) 02/02/1997  . INFLUENZA VACCINE  05/29/2018  . DEXA SCAN  Completed    Medications: Allergies as of 11/14/2018      Reactions   Feldene [piroxicam]    Kidney issues   Clinoril [sulindac] Rash   Morphine And Related Nausea And Vomiting      Medication List       Accurate as of November 14, 2018 11:59 PM. Always use your most recent med list.        acetaminophen 500 MG tablet Commonly known as:  TYLENOL Take 500 mg by mouth every 6 (six) hours as needed for mild pain.   apixaban 5 MG Tabs tablet Commonly known as:  ELIQUIS Take 1 tablet (5 mg total) by mouth 2 (two) times daily.   Calcium Carb-Cholecalciferol 600-200 MG-UNIT Tabs Commonly known as:  CALCIUM 600+D3 Take 1 tablet by mouth daily.   calcium carbonate 500 MG chewable tablet Commonly known as:  TUMS - dosed in mg elemental calcium Chew 1 tablet by mouth 2 (two) times daily as needed for indigestion.   diclofenac sodium 1 % Gel Commonly  known as:  VOLTAREN Apply 4 g topically 2 (two) times daily. To each shoulder   DULoxetine 30 MG capsule Commonly known as:  CYMBALTA Take 1 capsule (30 mg total) by mouth daily.   ferrous sulfate 325 (65 FE) MG tablet Take 1 tablet (325 mg total) by mouth every evening.   folic acid 1 MG tablet Commonly known as:  FOLVITE Take 1 tablet (  1 mg total) by mouth daily.   furosemide 20 MG tablet Commonly known as:  LASIX Take 1 tablet (20 mg total) by mouth daily.   gabapentin 300 MG capsule Commonly known as:  NEURONTIN Take 3 capsules (900 mg total) by mouth at bedtime.   methotrexate 2.5 MG tablet Commonly known as:  RHEUMATREX Take 10 tablets (25 mg total) by mouth once a week. Caution:Chemotherapy. Protect from light. THURSDAYS   nystatin cream Commonly known as:  MYCOSTATIN Apply 1 application topically 2 (two) times daily. APPLY TOPICALLY TO GROIN AREA ONCE DAILY   ondansetron 4 MG tablet Commonly known as:  ZOFRAN Take 1 tablet (4 mg total) by mouth every 6 (six) hours as needed for nausea.   pantoprazole 40 MG tablet Commonly known as:  PROTONIX Take 1 tablet (40 mg total) by mouth daily.   propranolol 20 MG tablet Commonly known as:  INDERAL Take 1 tablet (20 mg total) by mouth 2 (two) times daily.        Vitals:   11/14/18 1436  BP: 129/72  Pulse: (!) 52  Resp: 17  Temp: 98 F (36.7 C)  Weight: 152 lb 9.6 oz (69.2 kg)  Height: 5\' 2"  (1.575 m)   Body mass index is 27.91 kg/m.  Physical Exam  GENERAL APPEARANCE: Alert, conversant. No acute distress.  HEENT: Unremarkable. RESPIRATORY: Breathing is even, unlabored. Lung sounds are clear   CARDIOVASCULAR: Heart RRR no murmurs, rubs or gallops. No peripheral edema.  GASTROINTESTINAL: Abdomen is soft, non-tender, not distended w/ normal bowel sounds.  NEUROLOGIC: Cranial nerves 2-12 grossly intact. Moves all extremities   Labs reviewed: Basic Metabolic Panel: Recent Labs    10/20/18 1520   10/23/18 0409 10/24/18 0317 10/25/18 0408 10/26/18 0445 11/06/18  NA  --    < > 141 141 142 139 146  K  --    < > 3.8 3.4* 4.0 3.6 3.9  CL  --    < > 101 99 99 96*  --   CO2  --    < > 32 32 32 32  --   GLUCOSE  --    < > 107* 103* 107* 92  --   BUN  --    < > 13 16 20 22  24*  CREATININE  --    < > 0.96 0.80 0.92 1.09* 0.7  CALCIUM  --    < > 8.2* 8.7* 8.9 9.1  --   MG 1.9  --  1.7  --   --   --   --    < > = values in this interval not displayed.   No results found for: [facility] Liver Function Tests: Recent Labs    10/20/18 1423  AST 31  ALT 13  ALKPHOS 59  BILITOT 1.9*  PROT 7.5  ALBUMIN 3.6   Recent Labs    10/20/18 1423  LIPASE 25   No results for input(s): AMMONIA in the last 8760 hours. CBC: Recent Labs    10/20/18 1643 11/06/18  WBC 8.2 5.2  NEUTROABS 6.8  --   HGB 13.1 12.2  HCT 42.8 36  MCV 102.6*  --   PLT 220 241   Lipid No results for input(s): CHOL, HDL, LDLCALC, TRIG in the last 8760 hours. Cardiac Enzymes: No results for input(s): CKTOTAL, CKMB, CKMBINDEX, TROPONINI in the last 8760 hours. BNP: Recent Labs    10/20/18 0934  BNP 136.5*   CBG: No results for input(s): GLUCAP in the last  8760 hours.  Procedures and Imaging Studies During Stay: Ct Angio Head W Or Wo Contrast  Result Date: 10/20/2018 CLINICAL DATA:  Altered mental status. Weakness and dizziness. Headache with nausea and vomiting. EXAM: CT ANGIOGRAPHY HEAD AND NECK TECHNIQUE: Multidetector CT imaging of the head and neck was performed using the standard protocol during bolus administration of intravenous contrast. Multiplanar CT image reconstructions and MIPs were obtained to evaluate the vascular anatomy. Carotid stenosis measurements (when applicable) are obtained utilizing NASCET criteria, using the distal internal carotid diameter as the denominator. CONTRAST:  75mL ISOVUE-370 IOPAMIDOL (ISOVUE-370) INJECTION 76% COMPARISON:  08/19/2017 FINDINGS: CT HEAD FINDINGS Brain:  There is no mass, hemorrhage or extra-axial collection. There is generalized atrophy without lobar predilection. There is hypoattenuation of the periventricular white matter, most commonly indicating chronic ischemic microangiopathy. Skull: The visualized skull base, calvarium and extracranial soft tissues are normal. Sinuses/Orbits: No fluid levels or advanced mucosal thickening of the visualized paranasal sinuses. No mastoid or middle ear effusion. The orbits are normal. CTA NECK FINDINGS SKELETON: There is no bony spinal canal stenosis. No lytic or blastic lesion. OTHER NECK: Normal pharynx, larynx and major salivary glands. No cervical lymphadenopathy. Unremarkable thyroid gland. UPPER CHEST: No pneumothorax or pleural effusion. No nodules or masses. AORTIC ARCH: There is mild calcific atherosclerosis of the aortic arch. There is no aneurysm, dissection or hemodynamically significant stenosis of the visualized ascending aorta and aortic arch. Conventional 3 vessel aortic branching pattern. The visualized proximal subclavian arteries are widely patent. RIGHT CAROTID SYSTEM: --Common carotid artery: Widely patent origin without common carotid artery dissection or aneurysm. --Internal carotid artery: No dissection, occlusion or aneurysm. Mild atherosclerotic calcification at the carotid bifurcation without hemodynamically significant stenosis. --External carotid artery: No acute abnormality. LEFT CAROTID SYSTEM: --Common carotid artery: Widely patent origin without common carotid artery dissection or aneurysm. --Internal carotid artery: No dissection, occlusion or aneurysm. There is predominantly calcified atherosclerosis extending into the proximal ICA, resulting in less than 50% stenosis. --External carotid artery: No acute abnormality. VERTEBRAL ARTERIES: Codominant configuration. Both origins are normal. No dissection, occlusion or flow-limiting stenosis to the vertebrobasilar confluence. CTA HEAD FINDINGS  ANTERIOR CIRCULATION: --Intracranial internal carotid arteries: Atherosclerotic calcification of the internal carotid arteries at the skull base without hemodynamically significant stenosis. --Anterior cerebral arteries: Normal. Both A1 segments are present. Patent anterior communicating artery. --Middle cerebral arteries: Normal. --Posterior communicating arteries: Present on the left, absent on the right. POSTERIOR CIRCULATION: --Basilar artery: Normal. --Posterior cerebral arteries: Normal right. Fetal origin of the left. Mild stenosis of the proximal left P2 segment. --Superior cerebellar arteries: Normal. --Inferior cerebellar arteries: Normal anterior and posterior inferior cerebellar arteries. VENOUS SINUSES: As permitted by contrast timing, patent. ANATOMIC VARIANTS: Fetal origin of the left posterior cerebral artery. This is a normal anatomic variant. DELAYED PHASE: No parenchymal contrast enhancement. Review of the MIP images confirms the above findings. IMPRESSION: 1. No emergent large vessel occlusion or high-grade stenosis. 2. Bilateral carotid bifurcation atherosclerosis without hemodynamically significant stenosis. 3. Mild atrophy and chronic microvascular disease. Aortic atherosclerosis (ICD10-I70.0). Electronically Signed   By: Ulyses Jarred M.D.   On: 10/20/2018 17:58   Dg Chest 2 View  Result Date: 10/20/2018 CLINICAL DATA:  Generalized weakness, hypoxia, chills EXAM: CHEST - 2 VIEW COMPARISON:  10/24/2017 FINDINGS: Cardiomegaly. Large hiatal hernia. No confluent airspace opacities or effusions. No acute bony abnormality. IMPRESSION: Cardiomegaly, hiatal hernia.  No active disease. Electronically Signed   By: Rolm Baptise M.D.   On: 10/20/2018 16:31   Ct  Angio Neck W And/or Wo Contrast  Result Date: 10/20/2018 CLINICAL DATA:  Altered mental status. Weakness and dizziness. Headache with nausea and vomiting. EXAM: CT ANGIOGRAPHY HEAD AND NECK TECHNIQUE: Multidetector CT imaging of the head  and neck was performed using the standard protocol during bolus administration of intravenous contrast. Multiplanar CT image reconstructions and MIPs were obtained to evaluate the vascular anatomy. Carotid stenosis measurements (when applicable) are obtained utilizing NASCET criteria, using the distal internal carotid diameter as the denominator. CONTRAST:  33mL ISOVUE-370 IOPAMIDOL (ISOVUE-370) INJECTION 76% COMPARISON:  08/19/2017 FINDINGS: CT HEAD FINDINGS Brain: There is no mass, hemorrhage or extra-axial collection. There is generalized atrophy without lobar predilection. There is hypoattenuation of the periventricular white matter, most commonly indicating chronic ischemic microangiopathy. Skull: The visualized skull base, calvarium and extracranial soft tissues are normal. Sinuses/Orbits: No fluid levels or advanced mucosal thickening of the visualized paranasal sinuses. No mastoid or middle ear effusion. The orbits are normal. CTA NECK FINDINGS SKELETON: There is no bony spinal canal stenosis. No lytic or blastic lesion. OTHER NECK: Normal pharynx, larynx and major salivary glands. No cervical lymphadenopathy. Unremarkable thyroid gland. UPPER CHEST: No pneumothorax or pleural effusion. No nodules or masses. AORTIC ARCH: There is mild calcific atherosclerosis of the aortic arch. There is no aneurysm, dissection or hemodynamically significant stenosis of the visualized ascending aorta and aortic arch. Conventional 3 vessel aortic branching pattern. The visualized proximal subclavian arteries are widely patent. RIGHT CAROTID SYSTEM: --Common carotid artery: Widely patent origin without common carotid artery dissection or aneurysm. --Internal carotid artery: No dissection, occlusion or aneurysm. Mild atherosclerotic calcification at the carotid bifurcation without hemodynamically significant stenosis. --External carotid artery: No acute abnormality. LEFT CAROTID SYSTEM: --Common carotid artery: Widely patent  origin without common carotid artery dissection or aneurysm. --Internal carotid artery: No dissection, occlusion or aneurysm. There is predominantly calcified atherosclerosis extending into the proximal ICA, resulting in less than 50% stenosis. --External carotid artery: No acute abnormality. VERTEBRAL ARTERIES: Codominant configuration. Both origins are normal. No dissection, occlusion or flow-limiting stenosis to the vertebrobasilar confluence. CTA HEAD FINDINGS ANTERIOR CIRCULATION: --Intracranial internal carotid arteries: Atherosclerotic calcification of the internal carotid arteries at the skull base without hemodynamically significant stenosis. --Anterior cerebral arteries: Normal. Both A1 segments are present. Patent anterior communicating artery. --Middle cerebral arteries: Normal. --Posterior communicating arteries: Present on the left, absent on the right. POSTERIOR CIRCULATION: --Basilar artery: Normal. --Posterior cerebral arteries: Normal right. Fetal origin of the left. Mild stenosis of the proximal left P2 segment. --Superior cerebellar arteries: Normal. --Inferior cerebellar arteries: Normal anterior and posterior inferior cerebellar arteries. VENOUS SINUSES: As permitted by contrast timing, patent. ANATOMIC VARIANTS: Fetal origin of the left posterior cerebral artery. This is a normal anatomic variant. DELAYED PHASE: No parenchymal contrast enhancement. Review of the MIP images confirms the above findings. IMPRESSION: 1. No emergent large vessel occlusion or high-grade stenosis. 2. Bilateral carotid bifurcation atherosclerosis without hemodynamically significant stenosis. 3. Mild atrophy and chronic microvascular disease. Aortic atherosclerosis (ICD10-I70.0). Electronically Signed   By: Ulyses Jarred M.D.   On: 10/20/2018 17:58   Ct Angio Chest Pe W Or Wo Contrast  Addendum Date: 10/21/2018   ADDENDUM REPORT: 10/21/2018 13:32 ADDENDUM: Critical Value/emergent results were called by telephone  at the time of interpretation on 10/21/2018 at 1:31 pm to Dr. Skipper Cliche , who verbally acknowledged these results. Electronically Signed   By: Misty Stanley M.D.   On: 10/21/2018 13:32   Result Date: 10/21/2018 CLINICAL DATA:  Weakness and dizziness. EXAM:  CT ANGIOGRAPHY CHEST WITH CONTRAST TECHNIQUE: Multidetector CT imaging of the chest was performed using the standard protocol during bolus administration of intravenous contrast. Multiplanar CT image reconstructions and MIPs were obtained to evaluate the vascular anatomy. CONTRAST:  58mL ISOVUE-370 IOPAMIDOL (ISOVUE-370) INJECTION 76% COMPARISON:  None. FINDINGS: Cardiovascular: Heart is enlarged. Coronary artery calcification is evident. Atherosclerotic calcification is noted in the wall of the thoracic aorta. Acute pulmonary embolus is identified in segmental branches to the left upper lobe (159/7 and sagittal image 78/9). No additional filling defects are identified in the pulmonary arteries to suggest additional pulmonary embolus. Mediastinum/Nodes: No mediastinal lymphadenopathy. There is no hilar lymphadenopathy. Moderate to large hiatal hernia. There is no axillary lymphadenopathy. Lungs/Pleura: Areas of interstitial and patchy ground-glass attenuation are seen in the lungs bilaterally. With volume loss in the lower lobes. No overt pulmonary edema. No substantial pleural effusion. Upper Abdomen: Unremarkable. Musculoskeletal: No worrisome lytic or sclerotic osseous abnormality. Review of the MIP images confirms the above findings. IMPRESSION: 1. Small acute pulmonary embolus involving a segmental branch to the left upper lobe. No other pulmonary embolic disease evident. 2. Patchy ground-glass attenuation bilaterally. Air trapping would be a consideration. 3. Areas of volume loss/atelectasis in the lower lungs bilaterally. 4.  Aortic Atherosclerois (ICD10-170.0) Electronically Signed: By: Misty Stanley M.D. On: 10/21/2018 13:25   Ct C-spine No  Charge  Result Date: 10/20/2018 CLINICAL DATA:  85 year old female with altered mental status, neck pain, generalized weakness, dizziness. EXAM: CT CERVICAL SPINE WITH CONTRAST TECHNIQUE: Multiplanar CT images of the cervical spine were reconstructed from contemporary CTA of the Neck. CONTRAST:  No additional COMPARISON:  CTA head and neck today reported separately. Cervical spine CT 08/19/2017. FINDINGS: Alignment: Stable since 2018. Straightening and mild reversal of cervical lordosis with mild degenerative appearing anterolisthesis of C3 on C4 and C7 on T1. Bilateral posterior element alignment is within normal limits. Skull base and vertebrae: Visualized skull base is intact. No atlanto-occipital dissociation. No acute osseous abnormality identified in the cervical spine. Soft tissues and spinal canal: No prevertebral fluid or swelling. No visible canal hematoma. Arterial and other neck soft tissue findings are reported on the CTA today separately. Disc levels: Progressed since 2018 and severe right side C1-C2 joint space loss. Advanced disc and endplate degeneration except at C2-C3 and C7-T1. Advanced facet degeneration throughout the left cervical spine. Possible developing posterior element ankylosis on the left at C3-C4, while ankylosis has developed since 2018 on the left at C4-C5. Mild chronic spinal stenosis suspected C3-C4 through C5-C6. Upper chest: Visible upper thoracic levels appear grossly intact. Partially visible advanced right shoulder degeneration. Mild nonspecific upper lung mosaic attenuation, probably gas trapping. Other upper chest findings reported separately on the CTA today. Other: Severe TMJ degeneration greater on the right. IMPRESSION: 1. No acute osseous abnormality in the cervical spine. 2. Advanced cervical spine degeneration with progression since 2018. - severe right C1-C2 joint space loss. - degenerative ankylosis is developing at the left posterior elements of C3 through C5.  - multilevel mild spinal stenosis. 3. CTA head and neck today are reported separately. Electronically Signed   By: Genevie Ann M.D.   On: 10/20/2018 17:57   Vas [place] Lower Extremity Venous (dvt)  Result Date: 10/22/2018  Lower Venous Study Indications: History of DVT, and Pain.  Limitations: Body habitus and Pain. Performing Technologist: Rudell Cobb  Examination Guidelines: A complete evaluation includes B-mode imaging, spectral Doppler, color Doppler, and power Doppler as needed of all accessible portions of each  vessel. Bilateral testing is considered an integral part of a complete examination. Limited examinations for reoccurring indications may be performed as noted.  Right Venous Findings: +---------+---------------+---------+-----------+----------------------+-------+          CompressibilityPhasicitySpontaneityProperties            Summary +---------+---------------+---------+-----------+----------------------+-------+ CFV      None                    Yes        partially             Acute                                               re-cannalized                 +---------+---------------+---------+-----------+----------------------+-------+ SFJ      None                                                     Acute   +---------+---------------+---------+-----------+----------------------+-------+ FV Prox  None                                                     Acute   +---------+---------------+---------+-----------+----------------------+-------+ FV Mid   Full                                                             +---------+---------------+---------+-----------+----------------------+-------+ FV DistalPartial                                                  Acute   +---------+---------------+---------+-----------+----------------------+-------+ PFV      Partial                                                  Acute    +---------+---------------+---------+-----------+----------------------+-------+ POP      Full           Yes      Yes                                      +---------+---------------+---------+-----------+----------------------+-------+ PTV      Partial                 No                               Acute   +---------+---------------+---------+-----------+----------------------+-------+ PERO     Partial  No                               Acute   +---------+---------------+---------+-----------+----------------------+-------+ Right external iliac vein: heterogenous material in the lumen of vessel with doppler demonstrates patency, IVC patency examed with limited visibility.  Left Venous Findings: +---------+---------------+---------+-----------+---------------+--------------+          CompressibilityPhasicitySpontaneityProperties     Summary        +---------+---------------+---------+-----------+---------------+--------------+ CFV      Full           Yes      Yes                                      +---------+---------------+---------+-----------+---------------+--------------+ SFJ      Full                                                             +---------+---------------+---------+-----------+---------------+--------------+ FV Prox  Full                                                             +---------+---------------+---------+-----------+---------------+--------------+ FV Mid   Full                                              dual system    +---------+---------------+---------+-----------+---------------+--------------+ FV Distal                        Yes                       not well                                                                  visualized                                                                with                                                                       compression    +---------+---------------+---------+-----------+---------------+--------------+ PFV      Full                                                             +---------+---------------+---------+-----------+---------------+--------------+  POP      Full           Yes      Yes                                      +---------+---------------+---------+-----------+---------------+--------------+ PTV      Partial                            partially      Acute                                                      re-cannalized                 +---------+---------------+---------+-----------+---------------+--------------+ PERO     Partial                            partially      Acute                                                      re-cannalized                 +---------+---------------+---------+-----------+---------------+--------------+    Summary: Right: Findings consistent with acute deep vein thrombosis involving the right common femoral vein, right femoral vein, right posterior tibial vein, and right peroneal vein. Right external iliac vein examed: heterogenous material in the lumen of vessel with doppler demonstrates patency, IVC patency examed with limited visibility. No cystic structure found in the popliteal fossa. Left: Findings consistent with acute deep vein thrombosis involving the left posterior tibial vein, and left peroneal vein. No cystic structure found in the popliteal fossa.  *See table(s) above for measurements and observations. Electronically signed by Harold Barban MD on 10/22/2018 at 1:50:01 PM.    Final     Assessment/Plan:   Other acute pulmonary embolism without acute cor pulmonale (HCC)  Acute deep vein thrombosis (DVT) of femoral vein of right lower extremity (HCC)  Acute deep vein thrombosis (DVT) of tibial vein of both lower extremities (HCC)  Acute respiratory failure with hypoxia (HCC)  Paroxysmal atrial  fibrillation (HCC)  Chronic diastolic CHF (congestive heart failure) (HCC)  Rheumatoid arthritis involving multiple sites, unspecified rheumatoid factor presence (HCC)  Gastroesophageal reflux disease without esophagitis  Polyneuropathy  Neck pain  Acute hypokalemia  Hypomagnesemia   Patient is being discharged with the following home health services: OT/PT  Patient is being discharged with the following durable medical equipment: None  Patient has been advised to f/u with their PCP in 1-2 weeks to bring them up to date on their rehab stay.  Social services at facility was responsible for arranging this appointment.  Pt was provided with a 30 day supply of prescriptions for medications and refills must be obtained from their PCP.  For controlled substances, a more limited supply may be provided adequate until PCP appointment only.  Medications have been reconciled.  Time spent greater than 30 minutes;> 50% of time with patient was spent  reviewing records, labs, tests and studies, counseling and developing plan of care  Noah Delaine. Sheppard Coil, MD

## 2018-11-15 ENCOUNTER — Encounter: Payer: Self-pay | Admitting: Internal Medicine

## 2018-11-15 DIAGNOSIS — I2699 Other pulmonary embolism without acute cor pulmonale: Secondary | ICD-10-CM | POA: Insufficient documentation

## 2018-11-15 MED ORDER — PANTOPRAZOLE SODIUM 40 MG PO TBEC: 40.0000 mg | DELAYED_RELEASE_TABLET | Freq: Every day | ORAL | 0 refills | Status: AC

## 2018-11-15 MED ORDER — FOLIC ACID 1 MG PO TABS: 1.0000 mg | ORAL_TABLET | Freq: Every day | ORAL | 0 refills | Status: AC

## 2018-11-15 MED ORDER — PROPRANOLOL HCL 20 MG PO TABS: 20.0000 mg | ORAL_TABLET | Freq: Two times a day (BID) | ORAL | 0 refills | Status: DC

## 2018-11-15 MED ORDER — METHOTREXATE 2.5 MG PO TABS: 25.0000 mg | ORAL_TABLET | ORAL | 0 refills | Status: AC

## 2018-11-15 MED ORDER — CALCIUM CARB-CHOLECALCIFEROL 600-200 MG-UNIT PO TABS: 1.0000 | ORAL_TABLET | Freq: Every day | ORAL | 0 refills | Status: AC

## 2018-11-15 MED ORDER — DULOXETINE HCL 30 MG PO CPEP: 30.0000 mg | ORAL_CAPSULE | Freq: Every day | ORAL | 0 refills | Status: DC

## 2018-11-15 MED ORDER — FUROSEMIDE 20 MG PO TABS: 20.0000 mg | ORAL_TABLET | Freq: Every day | ORAL | 0 refills | Status: DC

## 2018-11-15 MED ORDER — ONDANSETRON HCL 4 MG PO TABS: 4.0000 mg | ORAL_TABLET | Freq: Four times a day (QID) | ORAL | 0 refills | Status: DC | PRN

## 2018-11-15 MED ORDER — APIXABAN 5 MG PO TABS: 5.0000 mg | ORAL_TABLET | Freq: Two times a day (BID) | ORAL | 0 refills | Status: DC

## 2018-11-15 MED ORDER — GABAPENTIN 300 MG PO CAPS: 900.0000 mg | ORAL_CAPSULE | Freq: Every day | ORAL | 0 refills | Status: AC

## 2018-11-15 MED ORDER — DICLOFENAC SODIUM 1 % TD GEL: 4.0000 g | Freq: Two times a day (BID) | TRANSDERMAL | 0 refills | Status: AC

## 2018-11-15 MED ORDER — FERROUS SULFATE 325 (65 FE) MG PO TABS: 325.0000 mg | ORAL_TABLET | Freq: Every evening | ORAL | 0 refills | Status: AC

## 2018-11-15 MED ORDER — NYSTATIN 100000 UNIT/GM EX CREA: 1.0000 "application " | TOPICAL_CREAM | Freq: Two times a day (BID) | CUTANEOUS | 0 refills | Status: DC

## 2018-11-17 ENCOUNTER — Other Ambulatory Visit: Payer: Self-pay | Admitting: Licensed Clinical Social Worker

## 2018-11-17 ENCOUNTER — Other Ambulatory Visit: Payer: Self-pay | Admitting: *Deleted

## 2018-11-17 NOTE — Addendum Note (Signed)
Addended by: Greg Cutter on: 11/17/2018 10:18 AM   Modules accepted: Orders

## 2018-11-17 NOTE — Patient Outreach (Signed)
[place] [facility]) Care Management  11/17/2018  Dominique Mckee December 27, 1929 [id number]  Patient successfully discharged back home from [facility] on 11/16/2018. THN CSW will sign off at this time and will make appropriate [facility] referrals for TOC and transportation.  Eula Fried, BSW, MSW, [email or web address] Phone: [phone] Fax: [phone]

## 2018-11-17 NOTE — Patient Outreach (Signed)
[place] [facility]) Care Management  11/17/2018  Dominique Mckee February 23, 1930 [id number]    Received a referral for d/c SNF on 1/18  RN attempted outreach call today however unsuccessful. RN able to leave a HIPAA approved voice message requesting a call back. Will send outreach letter and scheduled another outreach call this week.  Raina Mina, RN Care Management Coordinator [place] Office [phone]

## 2018-11-18 ENCOUNTER — Other Ambulatory Visit: Payer: Self-pay | Admitting: *Deleted

## 2018-11-18 ENCOUNTER — Other Ambulatory Visit: Payer: Self-pay

## 2018-11-18 DIAGNOSIS — R002 Palpitations: Secondary | ICD-10-CM | POA: Diagnosis not present

## 2018-11-18 NOTE — Patient Outreach (Addendum)
[place] [facility]) Care Management  11/18/2018  Dominique Mckee [birth year] [id number]  Unsuccessful Outreach  RN received a voice message from the son [name] from a message left to his phone from this RN case manager on Monday. RN attempted to return the call today however only able to leave another  HIPAA voice message requested a call back. In additional to a specific time and day that would be feasible for RN to return another call when he is available. If no response will follow up as scheduled in a few days with another outreach call.  Raina Mina, RN Care Management Coordinator [place] Office [phone]

## 2018-11-18 NOTE — Patient Outreach (Signed)
[place] [facility]) Care Management  11/18/2018  YLONDA STORR 1930/09/18 [id number]   Successful outreach regarding social work referral for transportation resources.  BSW spoke with patient's son, [name], about referral and transportation needs.  Mr. Kenton Kingfisher was given a [facility] application prior to patient's discharge from SNF.  BSW explained that it must be signed by a licensed professional and offered to complete application, gather appropriate signature, and fax to [facility] eligibility.  Application was completed over the phone and faxed to [place] informed Mr. Kenton Kingfisher that he will be contacted for the purpose of scheduling the eligibility interview.   Patient has an upcoming appointment with Dr. Lysle Rubens on 11/24/18 and son expressed concern about being able to transport her there due to her needing to utilize DME.  BSW agreed to arrange transportation via [place] for this appointment.   BSW will follow up with Mr. Kenton Kingfisher within the next two weeks to ensure he has been contacted by [employer].  Ronn Melena, BSW Social Worker [phone]

## 2018-11-19 DIAGNOSIS — N189 Chronic kidney disease, unspecified: Secondary | ICD-10-CM | POA: Diagnosis not present

## 2018-11-19 DIAGNOSIS — M797 Fibromyalgia: Secondary | ICD-10-CM | POA: Diagnosis not present

## 2018-11-19 DIAGNOSIS — M47896 Other spondylosis, lumbar region: Secondary | ICD-10-CM | POA: Diagnosis not present

## 2018-11-19 DIAGNOSIS — M1990 Unspecified osteoarthritis, unspecified site: Secondary | ICD-10-CM | POA: Diagnosis not present

## 2018-11-19 DIAGNOSIS — D631 Anemia in chronic kidney disease: Secondary | ICD-10-CM | POA: Diagnosis not present

## 2018-11-19 DIAGNOSIS — M48061 Spinal stenosis, lumbar region without neurogenic claudication: Secondary | ICD-10-CM | POA: Diagnosis not present

## 2018-11-19 DIAGNOSIS — I2699 Other pulmonary embolism without acute cor pulmonale: Secondary | ICD-10-CM | POA: Diagnosis not present

## 2018-11-19 DIAGNOSIS — I700 Atherosclerosis of aorta: Secondary | ICD-10-CM | POA: Diagnosis not present

## 2018-11-19 DIAGNOSIS — M5136 Other intervertebral disc degeneration, lumbar region: Secondary | ICD-10-CM | POA: Diagnosis not present

## 2018-11-19 DIAGNOSIS — I5032 Chronic diastolic (congestive) heart failure: Secondary | ICD-10-CM | POA: Diagnosis not present

## 2018-11-19 DIAGNOSIS — I6529 Occlusion and stenosis of unspecified carotid artery: Secondary | ICD-10-CM | POA: Diagnosis not present

## 2018-11-19 DIAGNOSIS — Z6827 Body mass index (BMI) 27.0-27.9, adult: Secondary | ICD-10-CM | POA: Diagnosis not present

## 2018-11-19 DIAGNOSIS — I82443 Acute embolism and thrombosis of tibial vein, bilateral: Secondary | ICD-10-CM | POA: Diagnosis not present

## 2018-11-19 DIAGNOSIS — I82453 Acute embolism and thrombosis of peroneal vein, bilateral: Secondary | ICD-10-CM | POA: Diagnosis not present

## 2018-11-19 DIAGNOSIS — G629 Polyneuropathy, unspecified: Secondary | ICD-10-CM | POA: Diagnosis not present

## 2018-11-19 DIAGNOSIS — I82411 Acute embolism and thrombosis of right femoral vein: Secondary | ICD-10-CM | POA: Diagnosis not present

## 2018-11-19 DIAGNOSIS — I480 Paroxysmal atrial fibrillation: Secondary | ICD-10-CM | POA: Diagnosis not present

## 2018-11-19 DIAGNOSIS — I69828 Other speech and language deficits following other cerebrovascular disease: Secondary | ICD-10-CM | POA: Diagnosis not present

## 2018-11-19 DIAGNOSIS — E669 Obesity, unspecified: Secondary | ICD-10-CM | POA: Diagnosis not present

## 2018-11-19 DIAGNOSIS — I130 Hypertensive heart and chronic kidney disease with heart failure and stage 1 through stage 4 chronic kidney disease, or unspecified chronic kidney disease: Secondary | ICD-10-CM | POA: Diagnosis not present

## 2018-11-19 DIAGNOSIS — G8929 Other chronic pain: Secondary | ICD-10-CM | POA: Diagnosis not present

## 2018-11-19 DIAGNOSIS — M069 Rheumatoid arthritis, unspecified: Secondary | ICD-10-CM | POA: Diagnosis not present

## 2018-11-19 DIAGNOSIS — M47892 Other spondylosis, cervical region: Secondary | ICD-10-CM | POA: Diagnosis not present

## 2018-11-19 DIAGNOSIS — R1314 Dysphagia, pharyngoesophageal phase: Secondary | ICD-10-CM | POA: Diagnosis not present

## 2018-11-19 DIAGNOSIS — R41841 Cognitive communication deficit: Secondary | ICD-10-CM | POA: Diagnosis not present

## 2018-11-20 ENCOUNTER — Encounter: Payer: Self-pay | Admitting: *Deleted

## 2018-11-20 ENCOUNTER — Other Ambulatory Visit: Payer: Self-pay | Admitting: *Deleted

## 2018-11-20 NOTE — Patient Outreach (Signed)
[place] [facility]) Care Management  11/20/2018  Dominique Mckee [birth year] [id number]    Post SNF discharge  RN spoke with the pt's son [name]) who lives with the pt. RN introduced the [facility] services and purpose for today's call. Caregiver mentioned several things going on and he was getting a little confused.  [place] is involved with several services PT/OT/CNA. States PT visited yesterday, PT today and CNA pending for a visit soon. Reports pt is very weak and unable to stand at this time. HF- RN inquired on HF monitoring as caregiver states pt not really having any issues with her HF with no swelling and issues with her breathing. States pt can "barely stand" at this time until she works with PT services with her ongoing weakness. RN educated on the HF zones and verified pt is in the GREEN zone. RN stress the importance of examining the pt daily for any fluid retention to her hands, feet/legs or trunk area with any congestion to be reported to the pt's provider who is following her HF (verbalized an understanding). Afib- Reports pt had a heart monitor on prior to SNF admission and it was removed. States he will check with the pt's CAD provider on if pt needs to restart her 30 day monitoring with the unit once again. RN will verify no presented symptoms since discharged from the SNF. RN educated caregiver on the [place].T and afib symptom that may be present if so caregiver is aware to seek medical or contact his provider based upon her symptoms.  Medication- Issues with Eliquis not being ordered via local pharmacy from [facility]. RN contact the pharmacy to verify no prescribed was ordered and strongly encourage pt's caregiver son to contact the CAD not just for the medication request but also for the heart monitoring and to make an appointments as requested post discharge. Pt receptive and will follow through with contacted the CAD provider for the above  information.  Discussed a plan of care with goals and interventions that caregiver is willing to work with the pt is completing related to safety due to pt's weakness and working with [facility] services, adherence with her medications by checking with her provider to verify Eliquis dosing and adherence with attending all her medical appointments. RN offered to contact pt weekly with ongoing transition of care calls over the next month to inquired further on possible needs. Son receptive to the calls. Will offer any other resources needed at this time and obtain more of the initial assessment on the next follow up call next week.  THN CM Care Plan Problem One     Most Recent Value  Care Plan Problem One  Knowle dge defict related to recent SNF discharge  Role Documenting the Problem One  Care Management [place] for Problem One  Active  THN Long Term Goal   Pt will work with [facility] for PT services over the next 30 days.  THN Long Term Goal Start Date  11/20/18  Interventions for Problem One Long Term Goal  Discussed the importance of working with [facility] for strenghtening exercises to avoid acute events from occurring.  THN CM Short Term Goal #1   Adherence with medication post SNF discharge over the next 30 days.  THN CM Short Term Goal #1 Start Date  11/20/18  Interventions for Short Term Goal #1  Will review all medications and offer assistance on discrepancies to avoid acute events from occurring. Will strongly  encouraged caregiver to follow up with primary provider on missing medicaitons todayin order for pt to be in compliance with her discharged medications.     THN CM Short Term Goal #2   Adherence with medical appointments over the next 30 days post SNF discharge.  THN CM Short Term Goal #2 Start Date  11/20/18  Interventions for Short Term Goal #2  Will verify all pending appointments and stress the improtance of follow up wit pt's primary provider for any needed changes to  pt's medical issues or referral needed based upon pt's recent SNF discharge.        Raina Mina, RN Care Management Coordinator [place] Office [phone]

## 2018-11-24 DIAGNOSIS — I2699 Other pulmonary embolism without acute cor pulmonale: Secondary | ICD-10-CM | POA: Diagnosis not present

## 2018-11-24 DIAGNOSIS — M069 Rheumatoid arthritis, unspecified: Secondary | ICD-10-CM | POA: Diagnosis not present

## 2018-11-24 DIAGNOSIS — M5090 Cervical disc disorder, unspecified, unspecified cervical region: Secondary | ICD-10-CM | POA: Diagnosis not present

## 2018-11-24 DIAGNOSIS — M797 Fibromyalgia: Secondary | ICD-10-CM | POA: Diagnosis not present

## 2018-11-28 ENCOUNTER — Other Ambulatory Visit: Payer: Self-pay | Admitting: *Deleted

## 2018-11-28 NOTE — Patient Outreach (Signed)
[place] [facility]) Care Management  11/28/2018  Dominique Mckee 1930-04-03 [id number]   Transition of care  RN spoke with the pt's son [name]) the primary caregiver who updated RN on pt's progress. States pt continue to work with [employer] services for PT services as pt continues to progress. RN inquired on pt's weights as caregiver states pt unable to weight due to her ongoing tremors but constantly reminder by the therapist when pt is stable to start weighting to monitoring her HF. Rn strongly encouraged daily weights and inquired on any symptoms that maybe presented. Son states pt has a history of swelling but only a small amount to her lower legs but denies any other symptoms related to her HF (controlled). States pt has a history of swelling to her knees and she was recently prescribed a new medication Prednisone. Pt has a 5 days supply and this is day two. Pt also reports the pt will be evaluate via [facility] services with a pending appointment 2/20 for services. Other services caregiver indicated is pending will be [employer] who will also evaluate pt for services soon.   Nutrition-Reports pt is eating well with no encountered issues. States he may obtain some supplements to assist further. No weight loss reported. MOST- RN inquired as pt requested a MOST form. RN will request this to be mailed to pt for future use.  Falls-Caregiver reports pt has not had a falls in 7 months  Discussed plan of care and updated accordingly with pt's progress. Will continue to reiterated on the goals and adjust the interventions accordingly. Caregiver continue to agree with this plan of care. No other needs at this time to address at pt continue to do well in following the current plan of care.  [facility] CM Care Plan Problem One     Most Recent Value  Care Plan Problem One  Knowledge defict related to recent SNF discharge  Role Documenting the Problem One  Care Management [place] for  Problem One  Active  THN Long Term Goal   Pt will work with [facility] for PT services over the next 30 days.  THN Long Term Goal Start Date  11/20/18  Interventions for Problem One Long Term Goal  Will continue to verify pt is working with [facility] services for strengthening exercises to prevent injuries. Will continue to encourage caregiver to assist with pt's participation with this goal. Will extend to allow ongoing adherence.   THN CM Short Term Goal #1   Adherence with medication post SNF discharge over the next 30 days.  THN CM Short Term Goal #1 Start Date  11/20/18  Interventions for Short Term Goal #1  Will educate on new medication and continue to verified pt has her refills and taking her prescribed medications as recommended. Will extend to allow ongoing adherence with medication administration.   THN CM Short Term Goal #2   Adherence with medical appointments over the next 30 days post SNF discharge.  THN CM Short Term Goal #2 Start Date  11/20/18  Interventions for Short Term Goal #2  Will verify [facility] is involved pending pt evaulation next week for services. Will strongly encouraged caregiver for pt not to miss this appointment for future services to start. Will extend to allow adherence with completing this process.       Raina Mina, RN Care Management Coordinator [place] Office [phone]

## 2018-12-02 ENCOUNTER — Other Ambulatory Visit: Payer: Self-pay

## 2018-12-02 NOTE — Patient Outreach (Signed)
[place] [facility]) Care Management  12/02/2018  KYNZLI REASE 07-28-1930 [id number]   Successful follow up call to patient's son, [name], to ensure that he has been contacted by [employer].  He reported that the eligibility interview has been scheduled for 12/18/18.  BSW will follow up again after that to get update on eligibility.   Ronn Melena, BSW Social Worker [phone]

## 2018-12-05 ENCOUNTER — Other Ambulatory Visit: Payer: Self-pay | Admitting: *Deleted

## 2018-12-05 NOTE — Patient Outreach (Signed)
[place] [facility]) Care Management  12/05/2018  Dominique Mckee [birth year] [id number]     RN spoke with pt's son [name] today. Caregiver son requested a call back on Monday due to his scheduled was very busy today. RN will follow up on Monday and further inquired on pt's ongoing management of care. No request presented at this time.   Raina Mina, RN Care Management Coordinator [place] Office [phone]

## 2018-12-08 ENCOUNTER — Other Ambulatory Visit: Payer: Self-pay | Admitting: *Deleted

## 2018-12-08 NOTE — Patient Outreach (Signed)
[place] [facility]) Care Management  12/08/2018  Dominique Mckee 08-29-1930 [id number]  Transition of care   RN spoke with caregiver son Dominique Mckee) who provided updated management of care today. Reports pt is making progress with her mobility working with [facility] PT services. States pt is requiring less assistance with getting up and ROM exercises. States pt continue to has sufficient transportation to all her appointments and taking all prescribed medications with no delays. Preventive measures continue to discussed and RN encouraged caregiver on ongoing safety measures to prevention acute problems from occurring.   RN discussed the plan of care and updated interventions accordingly to pt's ongoing progress. Will continue to officer supportive resources as caregiver and pt continue with her ongoing management of care. Review the care plan and adjusted interventions based upon today's report. Will continue to follow up telephonically with transition of care call next week. Caregiver aware of how to reach this RN case management if needed prior to the scheduled telephone call.  Will address any needed resources or issues at that time.   [facility] CM Care Plan Problem One     Most Recent Value  Care Plan Problem One  Knowledge defict related to recent SNF discharge  Role Documenting the Problem One  Care Management [place] for Problem One  Active  THN Long Term Goal   Pt will work with [facility] for PT services over the next 30 days.  THN Long Term Goal Start Date  11/20/18  Interventions for Problem One Long Term Goal  Will confirm PT continue to work wiht pt and pt is making progress improving with mobility and ROM to less assistance needed. Will continue no falls or injuries and pt continue to use her DME as needed.   THN CM Short Term Goal #1   Adherence with medication post SNF discharge over the next 30 days.  THN CM Short Term Goal #1 Start Date  11/20/18  Interventions  for Short Term Goal #1  Will confirm medications with no recent changes and continue to encouraged adherence with her daily medications. Will extend to allow ongoing adherence with her daily medication administration.  THN CM Short Term Goal #2   Adherence with medical appointments over the next 30 days post SNF discharge.  THN CM Short Term Goal #2 Start Date  11/20/18  Interventions for Short Term Goal #2  Will continue to confirm pt's attendance to all her medical appointmetns and verify ongoing source of transportation services. Will continue to extend to allow adherence with pending appointments.      Raina Mina, RN Care Management Coordinator [place] Office [phone]

## 2018-12-13 ENCOUNTER — Other Ambulatory Visit: Payer: Self-pay | Admitting: Internal Medicine

## 2018-12-17 ENCOUNTER — Other Ambulatory Visit: Payer: Self-pay | Admitting: Internal Medicine

## 2018-12-18 ENCOUNTER — Other Ambulatory Visit: Payer: Self-pay

## 2018-12-18 NOTE — Patient Outreach (Signed)
[place] [facility]) Care Management  12/18/2018  Dominique Mckee Dec 20, 1929 [id number]   Successful follow up call to patient's son, [name], regarding [employer] eligibility interview that was scheduled for today.  Mr. Kenton Kingfisher reported that he had to reschedule the interview for 01/05/19.  BSW will follow up after that regarding the determination for eligibility.   Ronn Melena, BSW Social Worker [phone]

## 2018-12-19 ENCOUNTER — Ambulatory Visit: Payer: Self-pay

## 2018-12-19 ENCOUNTER — Other Ambulatory Visit: Payer: Self-pay | Admitting: *Deleted

## 2018-12-19 NOTE — Patient Outreach (Signed)
[place] [facility]) Care Management  12/19/2018  Dominique Mckee 03/05/1930 [id number]   Telephone Assessment  RN attempted outreach call today however unsuccessful. RN able to leave a HIPAA voice message requesting a call back. Will further intervene at that time for ongoing [facility] services and pt's progress. Will reschedule another outreach call next week.   Raina Mina, RN Care Management Coordinator [place] Office [phone]

## 2018-12-23 ENCOUNTER — Other Ambulatory Visit: Payer: Self-pay | Admitting: *Deleted

## 2018-12-23 NOTE — Patient Outreach (Signed)
[place] [facility]) Care Management  12/23/2018  Dominique Mckee June 22, 1930 [id number]  Telephone Assessment  RN spoke with pt's son [name]) with update on pt's ongoing care. Reports pt needs refills however pharmacy has been delayed with pt's medications. Verified pt has enough medications for her daily dosing. RN offered pharmacy assist with possible bubble packaging or delivery of pt's medication however this was declined as caregiver indicated he "not there yet" and does not wish to have this service. Caregiver will follow up once again with the pt's pharmacy to check on the requested medications. RN inquired on the pending transportation services with [facility] for attendance to all her medical appointments. Caregiver indicates the initial appointments last week was cancelled due to the snow and rescheduled 3/9. States the agency will pick pt up, bring to the office and interview pt for services. Other discussion concerning pt's ongoing prevention measures with [employer] still involved for PT/OT services. Agency continues to service pt week with ongoing therapy.  Discussed the current plan of care in place and updated interventions accordingly. Will continue to encouraged ongoing adherence with this plan and follow up next month concerning pt's progress.  [facility] CM Care Plan Problem One     Most Recent Value  Care Plan Problem One  Knowledge defict related to recent SNF discharge  Role Documenting the Problem One  Care Management [place] for Problem One  Active  THN Long Term Goal   Pt will work with [facility] for PT services over the next 30 days.  THN Long Term Goal Start Date  11/20/18  Interventions for Problem One Long Term Goal  Will continue to inquired on [employer] as pt remains involved with PT/OT services weekly. Will continue to encouraged caregiver to reinforce teachings and education to pt from the ongoing therapist. Will extend this goal and re-evaluate next  month on pt's progress.  THN CM Short Term Goal #1   Adherence with medication post SNF discharge over the next 30 days.  THN CM Short Term Goal #1 Start Date  11/20/18  Interventions for Short Term Goal #1  Will inquired on ongoing medications administration as caregiver indicates will be changing pharmacy due to the delay medications on refills. Will verify pt has enough refills. Will offered pharmacy assistance via [facility] for bobble packs and/on delivery of medications (declined).  Will continue to encourage adherence with daily medication and extend this goal to allow change in pharmacy over the next month.   THN CM Short Term Goal #2   Adherence with medical appointments over the next 30 days post SNF discharge.  THN CM Short Term Goal #2 Start Date  11/20/18  Interventions for Short Term Goal #2  Will receive an update as pt's appointment date changed due ot snow last week to 3/9 where pt will be picked up by [facility] for evaluation on services for transportation resource. Will folllow up next month on progress for pending services next month.       Raina Mina, RN Care Management Coordinator [place] Office [phone]

## 2018-12-30 DIAGNOSIS — M797 Fibromyalgia: Secondary | ICD-10-CM | POA: Diagnosis not present

## 2018-12-30 DIAGNOSIS — Z6827 Body mass index (BMI) 27.0-27.9, adult: Secondary | ICD-10-CM | POA: Diagnosis not present

## 2018-12-30 DIAGNOSIS — I82443 Acute embolism and thrombosis of tibial vein, bilateral: Secondary | ICD-10-CM | POA: Diagnosis not present

## 2018-12-30 DIAGNOSIS — M069 Rheumatoid arthritis, unspecified: Secondary | ICD-10-CM | POA: Diagnosis not present

## 2018-12-30 DIAGNOSIS — I130 Hypertensive heart and chronic kidney disease with heart failure and stage 1 through stage 4 chronic kidney disease, or unspecified chronic kidney disease: Secondary | ICD-10-CM | POA: Diagnosis not present

## 2018-12-30 DIAGNOSIS — G8929 Other chronic pain: Secondary | ICD-10-CM | POA: Diagnosis not present

## 2018-12-30 DIAGNOSIS — N189 Chronic kidney disease, unspecified: Secondary | ICD-10-CM | POA: Diagnosis not present

## 2018-12-30 DIAGNOSIS — I82411 Acute embolism and thrombosis of right femoral vein: Secondary | ICD-10-CM | POA: Diagnosis not present

## 2018-12-30 DIAGNOSIS — D631 Anemia in chronic kidney disease: Secondary | ICD-10-CM | POA: Diagnosis not present

## 2018-12-30 DIAGNOSIS — R41841 Cognitive communication deficit: Secondary | ICD-10-CM | POA: Diagnosis not present

## 2018-12-30 DIAGNOSIS — M47896 Other spondylosis, lumbar region: Secondary | ICD-10-CM | POA: Diagnosis not present

## 2018-12-30 DIAGNOSIS — I480 Paroxysmal atrial fibrillation: Secondary | ICD-10-CM | POA: Diagnosis not present

## 2018-12-30 DIAGNOSIS — R1314 Dysphagia, pharyngoesophageal phase: Secondary | ICD-10-CM | POA: Diagnosis not present

## 2018-12-30 DIAGNOSIS — I2699 Other pulmonary embolism without acute cor pulmonale: Secondary | ICD-10-CM | POA: Diagnosis not present

## 2018-12-30 DIAGNOSIS — M1990 Unspecified osteoarthritis, unspecified site: Secondary | ICD-10-CM | POA: Diagnosis not present

## 2018-12-30 DIAGNOSIS — I69828 Other speech and language deficits following other cerebrovascular disease: Secondary | ICD-10-CM | POA: Diagnosis not present

## 2018-12-30 DIAGNOSIS — E669 Obesity, unspecified: Secondary | ICD-10-CM | POA: Diagnosis not present

## 2018-12-30 DIAGNOSIS — M47892 Other spondylosis, cervical region: Secondary | ICD-10-CM | POA: Diagnosis not present

## 2018-12-30 DIAGNOSIS — M48061 Spinal stenosis, lumbar region without neurogenic claudication: Secondary | ICD-10-CM | POA: Diagnosis not present

## 2018-12-30 DIAGNOSIS — M5136 Other intervertebral disc degeneration, lumbar region: Secondary | ICD-10-CM | POA: Diagnosis not present

## 2018-12-30 DIAGNOSIS — I6529 Occlusion and stenosis of unspecified carotid artery: Secondary | ICD-10-CM | POA: Diagnosis not present

## 2018-12-30 DIAGNOSIS — I5032 Chronic diastolic (congestive) heart failure: Secondary | ICD-10-CM | POA: Diagnosis not present

## 2018-12-30 DIAGNOSIS — G629 Polyneuropathy, unspecified: Secondary | ICD-10-CM | POA: Diagnosis not present

## 2018-12-30 DIAGNOSIS — I82453 Acute embolism and thrombosis of peroneal vein, bilateral: Secondary | ICD-10-CM | POA: Diagnosis not present

## 2018-12-30 DIAGNOSIS — I700 Atherosclerosis of aorta: Secondary | ICD-10-CM | POA: Diagnosis not present

## 2019-01-06 ENCOUNTER — Other Ambulatory Visit: Payer: Self-pay

## 2019-01-06 NOTE — Patient Outreach (Signed)
[place] [facility]) Care Management  01/06/2019  EMMAGRACE RUNKEL 1930-08-10 [id number]   Successful outreach to patient's son regarding [employer] eligibility interview that was scheduled for yesterday.  Patient did attend interview and was approved for services.  BSW is closing case at this time due to no other social work needs being identified.   Ronn Melena, BSW Social Worker [phone]

## 2019-01-22 ENCOUNTER — Other Ambulatory Visit: Payer: Self-pay | Admitting: Internal Medicine

## 2019-01-23 ENCOUNTER — Other Ambulatory Visit: Payer: Self-pay | Admitting: *Deleted

## 2019-01-23 ENCOUNTER — Other Ambulatory Visit: Payer: Self-pay

## 2019-01-23 NOTE — Patient Outreach (Signed)
[facility]) Care Management  01/23/2019  Dominique Mckee 12-Feb-1930 [id number]   RN spoke with pt's caregiver son Merry Proud) who indicated he has cancelled most of the pt's appointments due to the COVID 32. RN strongly encouraged pt if the provider's suggest or recommend pt attend for update lab-work for ongoing refills on certain medication if pt is able to attend without related COVID symptoms to attend if she has sufficient transportation. Caregiver confirms [facility] PT services have ended with ongoing instructions provided. Also reports and incident where pt was in the bath tub bathing and fell asleep. Caregiver reports pt has a slurred speech when he found her "slump over". States she slide out of the shower chair. States he called EMS and pt was examine and refused to go to the hospital at that time. Caregiver son did not alert the pt's provider of this incident that occurred on yesterday. States pt is feeling much better today.  RN strongly encouraged caregiver to contact the pt's provider due to the slurred speech and if pt has not been seem by a provider it maybe suggested due to her slurred speech at that time. Pt does not continue with slurred speech at this time and no additional issues have occurred. Based up these events with medical appointments cancelled and possible falls unspecific injuries at this time will extend [facility] services and follow up in few weeks to see if pt may have further examinations from her providers. Will review the plan of care and alter accordingly due to this recent incident. Will discuss goals and interventions accordingly.  [facility] CM Care Plan Problem One     Most Recent Value  Care Plan Problem One  Knowledge defict related to recent SNF discharge  Role Documenting the Problem One  Care Management [place] for Problem One  Active  [facility] Long Term Goal   Pt will work with [facility] for PT services over the next 30 days.  [facility]N Long Term Goal Start  Date  11/20/18  [facility]N Long Term Goal Met Date  01/23/19  [facility] CM Short Term Goal #1   Adherence with medication post SNF discharge over the next 30 days.  [facility] CM Short Term Goal #1 Start Date  11/20/18  [facility] CM Short Term Goal #1 Met Date  01/23/19  [facility]N CM Short Term Goal #2   Adherence with medical appointments over the next 30 days post SNF discharge.  [facility] CM Short Term Goal #2 Start Date  11/20/18  Interventions for Short Term Goal #2  Will extend as caregiver has cancelled to avoid the risk of the COvir 19. Will stress the importance of  rescheduling to lower the risk of incidents or issues that may arise related to pt's medical issues.      [facility] CM Care Plan Problem Two     Most Recent Value  Care Plan Problem Two  Fall incident  Role Documenting the Problem Two  Care Management Coordinator  Care Plan for Problem Two  Active  [facility] CM Short Term Goal #1   Pt will not have a fall over the next 30 days.  [facility] CM Short Term Goal #1 Start Date  01/23/19  Interventions for Short Term Goal #2   Will discuss the importance of contacting the pt's provider with all falls especially if EMS was called for incidents in the home. Will stress the importance of using pt's DME correctly with supervision at all time to prevent further risk of  falls and/or incidents.      Raina Mina, RN Care Management Coordinator [place] Office [phone]

## 2019-02-09 DIAGNOSIS — R4587 Impulsiveness: Secondary | ICD-10-CM | POA: Diagnosis not present

## 2019-02-09 DIAGNOSIS — M545 Low back pain: Secondary | ICD-10-CM | POA: Diagnosis not present

## 2019-02-09 DIAGNOSIS — I10 Essential (primary) hypertension: Secondary | ICD-10-CM | POA: Diagnosis not present

## 2019-02-09 DIAGNOSIS — I609 Nontraumatic subarachnoid hemorrhage, unspecified: Secondary | ICD-10-CM | POA: Diagnosis not present

## 2019-02-09 DIAGNOSIS — E871 Hypo-osmolality and hyponatremia: Secondary | ICD-10-CM | POA: Diagnosis not present

## 2019-02-09 DIAGNOSIS — D62 Acute posthemorrhagic anemia: Secondary | ICD-10-CM | POA: Diagnosis not present

## 2019-02-09 DIAGNOSIS — G479 Sleep disorder, unspecified: Secondary | ICD-10-CM | POA: Diagnosis not present

## 2019-02-09 DIAGNOSIS — F10231 Alcohol dependence with withdrawal delirium: Secondary | ICD-10-CM | POA: Diagnosis not present

## 2019-02-09 DIAGNOSIS — G894 Chronic pain syndrome: Secondary | ICD-10-CM | POA: Diagnosis not present

## 2019-02-20 ENCOUNTER — Other Ambulatory Visit: Payer: Self-pay | Admitting: *Deleted

## 2019-02-20 NOTE — Patient Outreach (Signed)
[place] [facility]) Care Management  02/20/2019  Dominique Mckee Nov 28, 1929 [id number]   RN spoke with pt' son and primary caregiver Merry Proud) who provided an update on pt's ongoing management of care. Son states pt has not had any additional events or falls as she continue to improve with her previous slurred speech from last month's assessment. [facility] for PT services has ended however son continue to encourage pt to perform the recommended exercises with increased her endurance and strength. States pt has advanced some with these exercises.  Reports he is continuing to pick up the pt's medications from her local pharmacy however one medication (methotrexate) pt is out but uses an OTC ointment when needed for her ongoing rheumatoid arthritis. States pt's provider is aware and pt will need lab-work before this prescribed is refilled. Son states he previously cancelled the scheduled appointment but if pt needs this medication with any increased pain or discomfort he will rescheduled an appointment for this pt "so far she is doing good".   Plan of care, goals and interventions adjusted based upon pt's progress and behaviors that have occurred. Will update accordingly and offer to follow up next month prior to pt's graduating from the [facility] program and services. Caregiver has agreed and verify the [facility] contact if needed prior to the call from next month.   [facility] CM Care Plan Problem One     Most Recent Value  Care Plan for Problem One  Active  [facility]N CM Short Term Goal #2   Adherence with medical appointments over the next 30 days post SNF discharge.  [facility] CM Short Term Goal #2 Start Date  11/20/18  Interventions for Short Term Goal #2  Goal will be extended to allow adhere with the pending appointment to her PCP.  WIll re-evaluate next call on pt's adherence with her attendance.     [facility] CM Care Plan Problem Two     Most Recent Value  Care Plan Problem Two  Fall incident  Role Documenting the Problem  Two  Care Management Coordinator  Care Plan for Problem Two  Not Active  [facility] CM Short Term Goal #1   Pt will not have a fall over the next 30 days.  [facility]N CM Short Term Goal #1 Start Date  01/23/19  [facility] CM Short Term Goal #1 Met Date   02/20/19      Raina Mina, RN Care Management Coordinator [place] Office [phone]

## 2019-02-27 ENCOUNTER — Encounter: Payer: Self-pay | Admitting: *Deleted

## 2019-03-25 ENCOUNTER — Other Ambulatory Visit: Payer: Self-pay | Admitting: *Deleted

## 2019-03-25 NOTE — Patient Outreach (Signed)
[place] [facility]) Care Management  03/25/2019  ELLENE BLOODSAW June 09, 1930 [id number]    RN outreach attempt unsuccessful today. RN able to leave a HIPAA approved voice message requesting a call back. Will continue to follow up for update on pt's management of care. Will reschedule another outreach over the next week.  Raina Mina, RN Care Management Coordinator [place] Office [phone]

## 2019-04-01 ENCOUNTER — Other Ambulatory Visit: Payer: Self-pay | Admitting: *Deleted

## 2019-04-01 NOTE — Patient Outreach (Signed)
[place] [facility]) Care Management  04/01/2019  Dominique Mckee 12-25-1929 [id number]   Case Closure  RN spoke with the primary caregiver son today Merry Proud) who indicates pt is doing well with no additional problems or barriers at this time. RN continued to offer any community resources at this time (none needed). RN also verified pt continue to attend all medical appointments and take all of her prescribed medications with no delays (confirmed). Based upon all goals met caregiver informed pt has graduated from [facility] services at this time with no additional needs to address. Case will be closed and pt's provider will be notified of pt's disposition.   Confirmed if there are any additional needs to reach out the [facility] with the available contact numbers. Case closed.

## 2019-04-23 DIAGNOSIS — M545 Low back pain: Secondary | ICD-10-CM | POA: Diagnosis not present

## 2019-04-23 DIAGNOSIS — M8088XD Other osteoporosis with current pathological fracture, vertebra(e), subsequent encounter for fracture with routine healing: Secondary | ICD-10-CM | POA: Diagnosis not present

## 2019-04-23 DIAGNOSIS — M0609 Rheumatoid arthritis without rheumatoid factor, multiple sites: Secondary | ICD-10-CM | POA: Diagnosis not present

## 2019-04-23 DIAGNOSIS — M14671 Charcot's joint, right ankle and foot: Secondary | ICD-10-CM | POA: Diagnosis not present

## 2019-04-23 DIAGNOSIS — S43004S Unspecified dislocation of right shoulder joint, sequela: Secondary | ICD-10-CM | POA: Diagnosis not present

## 2019-04-23 DIAGNOSIS — Z79899 Other long term (current) drug therapy: Secondary | ICD-10-CM | POA: Diagnosis not present

## 2019-04-23 DIAGNOSIS — M150 Primary generalized (osteo)arthritis: Secondary | ICD-10-CM | POA: Diagnosis not present

## 2019-05-20 DIAGNOSIS — N183 Chronic kidney disease, stage 3 (moderate): Secondary | ICD-10-CM | POA: Diagnosis not present

## 2019-05-20 DIAGNOSIS — K219 Gastro-esophageal reflux disease without esophagitis: Secondary | ICD-10-CM | POA: Diagnosis not present

## 2019-05-20 DIAGNOSIS — L989 Disorder of the skin and subcutaneous tissue, unspecified: Secondary | ICD-10-CM | POA: Diagnosis not present

## 2019-05-20 DIAGNOSIS — M519 Unspecified thoracic, thoracolumbar and lumbosacral intervertebral disc disorder: Secondary | ICD-10-CM | POA: Diagnosis not present

## 2019-05-20 DIAGNOSIS — I10 Essential (primary) hypertension: Secondary | ICD-10-CM | POA: Diagnosis not present

## 2019-05-20 DIAGNOSIS — M069 Rheumatoid arthritis, unspecified: Secondary | ICD-10-CM | POA: Diagnosis not present

## 2019-05-20 DIAGNOSIS — I2699 Other pulmonary embolism without acute cor pulmonale: Secondary | ICD-10-CM | POA: Diagnosis not present

## 2019-05-20 DIAGNOSIS — Z1389 Encounter for screening for other disorder: Secondary | ICD-10-CM | POA: Diagnosis not present

## 2019-05-20 DIAGNOSIS — I700 Atherosclerosis of aorta: Secondary | ICD-10-CM | POA: Diagnosis not present

## 2019-05-20 DIAGNOSIS — Z0000 Encounter for general adult medical examination without abnormal findings: Secondary | ICD-10-CM | POA: Diagnosis not present

## 2019-05-20 DIAGNOSIS — I5030 Unspecified diastolic (congestive) heart failure: Secondary | ICD-10-CM | POA: Diagnosis not present

## 2019-05-20 DIAGNOSIS — F329 Major depressive disorder, single episode, unspecified: Secondary | ICD-10-CM | POA: Diagnosis not present

## 2019-05-20 DIAGNOSIS — G8929 Other chronic pain: Secondary | ICD-10-CM | POA: Diagnosis not present

## 2019-05-20 DIAGNOSIS — R7303 Prediabetes: Secondary | ICD-10-CM | POA: Diagnosis not present

## 2019-05-28 DIAGNOSIS — D044 Carcinoma in situ of skin of scalp and neck: Secondary | ICD-10-CM | POA: Diagnosis not present

## 2019-05-28 DIAGNOSIS — D0439 Carcinoma in situ of skin of other parts of face: Secondary | ICD-10-CM | POA: Diagnosis not present

## 2019-05-28 DIAGNOSIS — C44311 Basal cell carcinoma of skin of nose: Secondary | ICD-10-CM | POA: Diagnosis not present

## 2019-06-10 DIAGNOSIS — Z85828 Personal history of other malignant neoplasm of skin: Secondary | ICD-10-CM | POA: Diagnosis not present

## 2019-06-10 DIAGNOSIS — D2339 Other benign neoplasm of skin of other parts of face: Secondary | ICD-10-CM | POA: Diagnosis not present

## 2019-06-10 DIAGNOSIS — Z08 Encounter for follow-up examination after completed treatment for malignant neoplasm: Secondary | ICD-10-CM | POA: Diagnosis not present

## 2019-06-10 DIAGNOSIS — L570 Actinic keratosis: Secondary | ICD-10-CM | POA: Diagnosis not present

## 2019-07-13 DIAGNOSIS — M150 Primary generalized (osteo)arthritis: Secondary | ICD-10-CM | POA: Diagnosis not present

## 2019-07-13 DIAGNOSIS — M14671 Charcot's joint, right ankle and foot: Secondary | ICD-10-CM | POA: Diagnosis not present

## 2019-07-13 DIAGNOSIS — M0609 Rheumatoid arthritis without rheumatoid factor, multiple sites: Secondary | ICD-10-CM | POA: Diagnosis not present

## 2019-07-13 DIAGNOSIS — M545 Low back pain: Secondary | ICD-10-CM | POA: Diagnosis not present

## 2019-07-13 DIAGNOSIS — Z79899 Other long term (current) drug therapy: Secondary | ICD-10-CM | POA: Diagnosis not present

## 2019-07-13 DIAGNOSIS — G629 Polyneuropathy, unspecified: Secondary | ICD-10-CM | POA: Diagnosis not present

## 2019-07-24 DIAGNOSIS — R51 Headache: Secondary | ICD-10-CM | POA: Diagnosis not present

## 2019-07-24 DIAGNOSIS — R6884 Jaw pain: Secondary | ICD-10-CM | POA: Diagnosis not present

## 2019-08-10 ENCOUNTER — Other Ambulatory Visit: Payer: Self-pay

## 2019-08-10 ENCOUNTER — Observation Stay (HOSPITAL_COMMUNITY)
Admission: EM | Admit: 2019-08-10 | Discharge: 2019-08-12 | Disposition: A | Discharge status: Home Health | Payer: PPO | Attending: Internal Medicine | Admitting: Internal Medicine

## 2019-08-10 DIAGNOSIS — M8580 Other specified disorders of bone density and structure, unspecified site: Secondary | ICD-10-CM | POA: Diagnosis not present

## 2019-08-10 DIAGNOSIS — R7303 Prediabetes: Secondary | ICD-10-CM | POA: Diagnosis not present

## 2019-08-10 DIAGNOSIS — I4510 Unspecified right bundle-branch block: Secondary | ICD-10-CM | POA: Insufficient documentation

## 2019-08-10 DIAGNOSIS — Z66 Do not resuscitate: Secondary | ICD-10-CM | POA: Diagnosis not present

## 2019-08-10 DIAGNOSIS — I82511 Chronic embolism and thrombosis of right femoral vein: Secondary | ICD-10-CM | POA: Diagnosis not present

## 2019-08-10 DIAGNOSIS — Z7901 Long term (current) use of anticoagulants: Secondary | ICD-10-CM | POA: Insufficient documentation

## 2019-08-10 DIAGNOSIS — R4020 Unspecified coma: Secondary | ICD-10-CM | POA: Diagnosis not present

## 2019-08-10 DIAGNOSIS — R079 Chest pain, unspecified: Secondary | ICD-10-CM | POA: Diagnosis not present

## 2019-08-10 DIAGNOSIS — K449 Diaphragmatic hernia without obstruction or gangrene: Secondary | ICD-10-CM | POA: Diagnosis not present

## 2019-08-10 DIAGNOSIS — R2681 Unsteadiness on feet: Secondary | ICD-10-CM | POA: Insufficient documentation

## 2019-08-10 DIAGNOSIS — M069 Rheumatoid arthritis, unspecified: Secondary | ICD-10-CM | POA: Insufficient documentation

## 2019-08-10 DIAGNOSIS — Z20828 Contact with and (suspected) exposure to other viral communicable diseases: Secondary | ICD-10-CM | POA: Insufficient documentation

## 2019-08-10 DIAGNOSIS — Z86718 Personal history of other venous thrombosis and embolism: Secondary | ICD-10-CM | POA: Insufficient documentation

## 2019-08-10 DIAGNOSIS — I5032 Chronic diastolic (congestive) heart failure: Secondary | ICD-10-CM | POA: Diagnosis not present

## 2019-08-10 DIAGNOSIS — D539 Nutritional anemia, unspecified: Secondary | ICD-10-CM | POA: Insufficient documentation

## 2019-08-10 DIAGNOSIS — K219 Gastro-esophageal reflux disease without esophagitis: Secondary | ICD-10-CM | POA: Diagnosis not present

## 2019-08-10 DIAGNOSIS — Z888 Allergy status to other drugs, medicaments and biological substances status: Secondary | ICD-10-CM | POA: Insufficient documentation

## 2019-08-10 DIAGNOSIS — I10 Essential (primary) hypertension: Secondary | ICD-10-CM | POA: Diagnosis present

## 2019-08-10 DIAGNOSIS — I700 Atherosclerosis of aorta: Secondary | ICD-10-CM | POA: Insufficient documentation

## 2019-08-10 DIAGNOSIS — Z7952 Long term (current) use of systemic steroids: Secondary | ICD-10-CM | POA: Insufficient documentation

## 2019-08-10 DIAGNOSIS — Z993 Dependence on wheelchair: Secondary | ICD-10-CM | POA: Diagnosis not present

## 2019-08-10 DIAGNOSIS — H9190 Unspecified hearing loss, unspecified ear: Secondary | ICD-10-CM | POA: Insufficient documentation

## 2019-08-10 DIAGNOSIS — I130 Hypertensive heart and chronic kidney disease with heart failure and stage 1 through stage 4 chronic kidney disease, or unspecified chronic kidney disease: Secondary | ICD-10-CM | POA: Insufficient documentation

## 2019-08-10 DIAGNOSIS — Z791 Long term (current) use of non-steroidal anti-inflammatories (NSAID): Secondary | ICD-10-CM | POA: Insufficient documentation

## 2019-08-10 DIAGNOSIS — N189 Chronic kidney disease, unspecified: Secondary | ICD-10-CM | POA: Diagnosis not present

## 2019-08-10 DIAGNOSIS — D649 Anemia, unspecified: Secondary | ICD-10-CM | POA: Diagnosis present

## 2019-08-10 DIAGNOSIS — R001 Bradycardia, unspecified: Secondary | ICD-10-CM | POA: Diagnosis not present

## 2019-08-10 DIAGNOSIS — G8929 Other chronic pain: Secondary | ICD-10-CM | POA: Insufficient documentation

## 2019-08-10 DIAGNOSIS — F329 Major depressive disorder, single episode, unspecified: Secondary | ICD-10-CM | POA: Insufficient documentation

## 2019-08-10 DIAGNOSIS — Z885 Allergy status to narcotic agent status: Secondary | ICD-10-CM | POA: Insufficient documentation

## 2019-08-10 DIAGNOSIS — Z7982 Long term (current) use of aspirin: Secondary | ICD-10-CM | POA: Insufficient documentation

## 2019-08-10 DIAGNOSIS — R55 Syncope and collapse: Secondary | ICD-10-CM | POA: Diagnosis not present

## 2019-08-10 DIAGNOSIS — N179 Acute kidney failure, unspecified: Secondary | ICD-10-CM | POA: Diagnosis not present

## 2019-08-10 DIAGNOSIS — Z79899 Other long term (current) drug therapy: Secondary | ICD-10-CM | POA: Insufficient documentation

## 2019-08-10 DIAGNOSIS — I480 Paroxysmal atrial fibrillation: Secondary | ICD-10-CM | POA: Diagnosis not present

## 2019-08-10 DIAGNOSIS — R404 Transient alteration of awareness: Secondary | ICD-10-CM | POA: Diagnosis not present

## 2019-08-10 DIAGNOSIS — E876 Hypokalemia: Secondary | ICD-10-CM | POA: Diagnosis not present

## 2019-08-10 DIAGNOSIS — M797 Fibromyalgia: Secondary | ICD-10-CM | POA: Insufficient documentation

## 2019-08-10 DIAGNOSIS — I2510 Atherosclerotic heart disease of native coronary artery without angina pectoris: Secondary | ICD-10-CM | POA: Insufficient documentation

## 2019-08-10 DIAGNOSIS — Z96651 Presence of right artificial knee joint: Secondary | ICD-10-CM | POA: Insufficient documentation

## 2019-08-10 DIAGNOSIS — R52 Pain, unspecified: Secondary | ICD-10-CM | POA: Diagnosis not present

## 2019-08-10 DIAGNOSIS — Z86711 Personal history of pulmonary embolism: Secondary | ICD-10-CM | POA: Insufficient documentation

## 2019-08-10 LAB — BASIC METABOLIC PANEL
Anion gap: 12 (ref 5–15)
BUN: 32 mg/dL — ABNORMAL HIGH (ref 8–23)
CO2: 26 mmol/L (ref 22–32)
Calcium: 8.5 mg/dL — ABNORMAL LOW (ref 8.9–10.3)
Chloride: 104 mmol/L (ref 98–111)
Creatinine, Ser: 1.14 mg/dL — ABNORMAL HIGH (ref 0.44–1.00)
GFR calc Af Amer: 50 mL/min — ABNORMAL LOW (ref >60)
GFR calc non Af Amer: 43 mL/min — ABNORMAL LOW (ref >60)
Glucose, Bld: 234 mg/dL — ABNORMAL HIGH (ref 70–99)
Potassium: 3.8 mmol/L (ref 3.5–5.1)
Sodium: 142 mmol/L (ref 135–145)

## 2019-08-10 LAB — CBC
HCT: 36.1 % (ref 36.0–46.0)
Hemoglobin: 11.2 g/dL — ABNORMAL LOW (ref 12.0–15.0)
MCH: 33.4 pg (ref 26.0–34.0)
MCHC: 31 g/dL (ref 30.0–36.0)
MCV: 107.8 fL — ABNORMAL HIGH (ref 80.0–100.0)
Platelets: 230 10*3/uL (ref 150–400)
RBC: 3.35 MIL/uL — ABNORMAL LOW (ref 3.87–5.11)
RDW: 15.3 % (ref 11.5–15.5)
WBC: 5.9 10*3/uL (ref 4.0–10.5)
nRBC: 0 % (ref 0.0–0.2)

## 2019-08-10 MED ORDER — SODIUM CHLORIDE 0.9% FLUSH: 3.0000 mL | Freq: Once | INTRAVENOUS | Status: DC

## 2019-08-10 NOTE — ED Triage Notes (Signed)
For two months pt has been having near syncopal episodes while having bowel movements. Today pt had syncopal episode while having bowel movement, did not fall. No radial pulses, unable to get manual blood pressure. Pt unresponsive for 15 mins. Pt now A/O x 4, pt just feels weak. Pt c/o pain in her R jaw area, recently had teeth fall out and has seen dentist for same r/t infection.

## 2019-08-11 ENCOUNTER — Observation Stay (HOSPITAL_BASED_OUTPATIENT_CLINIC_OR_DEPARTMENT_OTHER): Payer: PPO

## 2019-08-11 ENCOUNTER — Observation Stay (HOSPITAL_COMMUNITY): Payer: PPO

## 2019-08-11 ENCOUNTER — Emergency Department (HOSPITAL_COMMUNITY): Payer: PPO

## 2019-08-11 DIAGNOSIS — I10 Essential (primary) hypertension: Secondary | ICD-10-CM

## 2019-08-11 DIAGNOSIS — R55 Syncope and collapse: Secondary | ICD-10-CM

## 2019-08-11 DIAGNOSIS — I351 Nonrheumatic aortic (valve) insufficiency: Secondary | ICD-10-CM | POA: Diagnosis not present

## 2019-08-11 DIAGNOSIS — Z86711 Personal history of pulmonary embolism: Secondary | ICD-10-CM | POA: Diagnosis not present

## 2019-08-11 DIAGNOSIS — D649 Anemia, unspecified: Secondary | ICD-10-CM | POA: Diagnosis not present

## 2019-08-11 DIAGNOSIS — K219 Gastro-esophageal reflux disease without esophagitis: Secondary | ICD-10-CM

## 2019-08-11 DIAGNOSIS — R1013 Epigastric pain: Secondary | ICD-10-CM | POA: Diagnosis not present

## 2019-08-11 DIAGNOSIS — Z86718 Personal history of other venous thrombosis and embolism: Secondary | ICD-10-CM

## 2019-08-11 DIAGNOSIS — I340 Nonrheumatic mitral (valve) insufficiency: Secondary | ICD-10-CM | POA: Diagnosis not present

## 2019-08-11 DIAGNOSIS — E876 Hypokalemia: Secondary | ICD-10-CM | POA: Diagnosis not present

## 2019-08-11 DIAGNOSIS — G8929 Other chronic pain: Secondary | ICD-10-CM

## 2019-08-11 DIAGNOSIS — M069 Rheumatoid arthritis, unspecified: Secondary | ICD-10-CM | POA: Diagnosis not present

## 2019-08-11 LAB — BRAIN NATRIURETIC PEPTIDE: B Natriuretic Peptide: 56.7 pg/mL (ref 0.0–100.0)

## 2019-08-11 LAB — CBC
HCT: 37.2 % (ref 36.0–46.0)
Hemoglobin: 11.6 g/dL — ABNORMAL LOW (ref 12.0–15.0)
MCH: 33.3 pg (ref 26.0–34.0)
MCHC: 31.2 g/dL (ref 30.0–36.0)
MCV: 106.9 fL — ABNORMAL HIGH (ref 80.0–100.0)
Platelets: 234 10*3/uL (ref 150–400)
RBC: 3.48 MIL/uL — ABNORMAL LOW (ref 3.87–5.11)
RDW: 15.4 % (ref 11.5–15.5)
WBC: 7.3 10*3/uL (ref 4.0–10.5)
nRBC: 0 % (ref 0.0–0.2)

## 2019-08-11 LAB — ECHOCARDIOGRAM COMPLETE
Height: 62 in
Weight: 2400 oz

## 2019-08-11 LAB — HEMOGLOBIN A1C
Hgb A1c MFr Bld: 5.7 % — ABNORMAL HIGH (ref 4.8–5.6)
Mean Plasma Glucose: 116.89 mg/dL

## 2019-08-11 LAB — TROPONIN I (HIGH SENSITIVITY)
Troponin I (High Sensitivity): 6 ng/L (ref <18)
Troponin I (High Sensitivity): 5 ng/L (ref <18)

## 2019-08-11 LAB — URINALYSIS, ROUTINE W REFLEX MICROSCOPIC
Bilirubin Urine: NEGATIVE
Glucose, UA: NEGATIVE mg/dL
Hgb urine dipstick: NEGATIVE
Ketones, ur: NEGATIVE mg/dL
Leukocytes,Ua: NEGATIVE
Nitrite: NEGATIVE
Protein, ur: NEGATIVE mg/dL
Specific Gravity, Urine: 1.019 (ref 1.005–1.030)
pH: 5 (ref 5.0–8.0)

## 2019-08-11 LAB — BASIC METABOLIC PANEL
Anion gap: 10 (ref 5–15)
BUN: 29 mg/dL — ABNORMAL HIGH (ref 8–23)
CO2: 27 mmol/L (ref 22–32)
Calcium: 8.2 mg/dL — ABNORMAL LOW (ref 8.9–10.3)
Chloride: 106 mmol/L (ref 98–111)
Creatinine, Ser: 0.91 mg/dL (ref 0.44–1.00)
GFR calc Af Amer: >60 mL/min (ref >60)
GFR calc non Af Amer: 57 mL/min — ABNORMAL LOW (ref >60)
Glucose, Bld: 99 mg/dL (ref 70–99)
Potassium: 3.3 mmol/L — ABNORMAL LOW (ref 3.5–5.1)
Sodium: 143 mmol/L (ref 135–145)

## 2019-08-11 LAB — MAGNESIUM: Magnesium: 2.2 mg/dL (ref 1.7–2.4)

## 2019-08-11 LAB — VITAMIN B12: Vitamin B-12: 400 pg/mL (ref 180–914)

## 2019-08-11 LAB — D-DIMER, QUANTITATIVE: D-Dimer, Quant: 1.66 ug/mL-FEU — ABNORMAL HIGH (ref 0.00–0.50)

## 2019-08-11 LAB — CBG MONITORING, ED
Glucose-Capillary: 94 mg/dL (ref 70–99)
Glucose-Capillary: 119 mg/dL — ABNORMAL HIGH (ref 70–99)

## 2019-08-11 LAB — SARS CORONAVIRUS 2 (TAT 6-24 HRS): SARS Coronavirus 2: NEGATIVE

## 2019-08-11 MED ORDER — DULOXETINE HCL 30 MG PO CPEP
30.0000 mg | ORAL_CAPSULE | Freq: Every day | ORAL | Status: DC
  Administered 2019-08-11 – 2019-08-12 (×2): 30 mg via ORAL
  Filled 2019-08-11 (×2): qty 1

## 2019-08-11 MED ORDER — POTASSIUM CHLORIDE CRYS ER 20 MEQ PO TBCR
40.0000 meq | EXTENDED_RELEASE_TABLET | ORAL | Status: AC
  Administered 2019-08-11: 12:00:00 40 meq via ORAL
  Filled 2019-08-11: qty 2

## 2019-08-11 MED ORDER — CALCIUM CARBONATE-VITAMIN D 500-200 MG-UNIT PO TABS
1.0000 | ORAL_TABLET | Freq: Every day | ORAL | Status: DC
  Administered 2019-08-12: 1 via ORAL
  Filled 2019-08-11 (×2): qty 1

## 2019-08-11 MED ORDER — ALBUTEROL SULFATE (2.5 MG/3ML) 0.083% IN NEBU: 2.5000 mg | INHALATION_SOLUTION | Freq: Four times a day (QID) | RESPIRATORY_TRACT | Status: DC | PRN

## 2019-08-11 MED ORDER — SODIUM CHLORIDE 0.9% FLUSH
3.0000 mL | Freq: Two times a day (BID) | INTRAVENOUS | Status: DC
  Administered 2019-08-11 – 2019-08-12 (×2): 3 mL via INTRAVENOUS

## 2019-08-11 MED ORDER — ENOXAPARIN SODIUM 80 MG/0.8ML ~~LOC~~ SOLN
70.0000 mg | Freq: Two times a day (BID) | SUBCUTANEOUS | Status: DC
  Administered 2019-08-11 – 2019-08-12 (×3): 70 mg via SUBCUTANEOUS
  Filled 2019-08-11 (×2): qty 0.7
  Filled 2019-08-11 (×2): qty 0.8

## 2019-08-11 MED ORDER — CALCIUM CARB-CHOLECALCIFEROL 600-200 MG-UNIT PO TABS: 1.0000 | ORAL_TABLET | Freq: Every day | ORAL | Status: DC

## 2019-08-11 MED ORDER — ONDANSETRON HCL 4 MG PO TABS: 4.0000 mg | ORAL_TABLET | Freq: Four times a day (QID) | ORAL | Status: DC | PRN

## 2019-08-11 MED ORDER — METHOTREXATE 2.5 MG PO TABS: 25.0000 mg | ORAL_TABLET | ORAL | Status: DC

## 2019-08-11 MED ORDER — GABAPENTIN 300 MG PO CAPS
900.0000 mg | ORAL_CAPSULE | Freq: Every day | ORAL | Status: DC
  Administered 2019-08-11: 21:00:00 900 mg via ORAL
  Filled 2019-08-11: qty 3

## 2019-08-11 MED ORDER — PROPRANOLOL HCL 20 MG PO TABS
20.0000 mg | ORAL_TABLET | Freq: Two times a day (BID) | ORAL | Status: DC
  Administered 2019-08-11 (×2): 20 mg via ORAL
  Filled 2019-08-11 (×4): qty 1

## 2019-08-11 MED ORDER — SODIUM CHLORIDE 0.9 % IV SOLN
Freq: Once | INTRAVENOUS | Status: AC
  Administered 2019-08-11: 08:00:00 via INTRAVENOUS

## 2019-08-11 MED ORDER — IOHEXOL 350 MG/ML SOLN
100.0000 mL | Freq: Once | INTRAVENOUS | Status: AC | PRN
  Administered 2019-08-11: 10:00:00 100 mL via INTRAVENOUS

## 2019-08-11 MED ORDER — SODIUM CHLORIDE 0.9 % IV BOLUS
500.0000 mL | Freq: Once | INTRAVENOUS | Status: AC
  Administered 2019-08-11: 04:00:00 500 mL via INTRAVENOUS

## 2019-08-11 MED ORDER — FOLIC ACID 1 MG PO TABS
1.0000 mg | ORAL_TABLET | Freq: Every day | ORAL | Status: DC
  Administered 2019-08-11 – 2019-08-12 (×2): 1 mg via ORAL
  Filled 2019-08-11 (×2): qty 1

## 2019-08-11 MED ORDER — ONDANSETRON HCL 4 MG/2ML IJ SOLN: 4.0000 mg | Freq: Four times a day (QID) | INTRAMUSCULAR | Status: DC | PRN

## 2019-08-11 MED ORDER — PANTOPRAZOLE SODIUM 40 MG PO TBEC
40.0000 mg | DELAYED_RELEASE_TABLET | Freq: Every day | ORAL | Status: DC
  Administered 2019-08-11 – 2019-08-12 (×2): 40 mg via ORAL
  Filled 2019-08-11 (×2): qty 1

## 2019-08-11 MED ORDER — ACETAMINOPHEN 650 MG RE SUPP: 650.0000 mg | Freq: Four times a day (QID) | RECTAL | Status: DC | PRN

## 2019-08-11 MED ORDER — INSULIN ASPART 100 UNIT/ML ~~LOC~~ SOLN: 0.0000 [IU] | Freq: Three times a day (TID) | SUBCUTANEOUS | Status: DC

## 2019-08-11 MED ORDER — ACETAMINOPHEN 325 MG PO TABS
650.0000 mg | ORAL_TABLET | Freq: Four times a day (QID) | ORAL | Status: DC | PRN
  Administered 2019-08-11 (×2): 650 mg via ORAL
  Filled 2019-08-11 (×2): qty 2

## 2019-08-11 NOTE — ED Notes (Signed)
Report attempted to [facility]

## 2019-08-11 NOTE — Progress Notes (Signed)
PT Cancellation Note  Patient Details Name: HADLEA ACCOMANDO MRN: [id number] DOB: Oct 08, 1930   Cancelled Treatment:    Reason Eval/Treat Not Completed: Medical issues which prohibited therapy Pt awaiting imaging to rule out PE/DVT. Will hold until pt medically appropriate and follow up as schedule allows.   Leighton Ruff, PT, DPT  Acute Rehabilitation Services  Pager: [phone] Office: [phone]    Rudean Hitt 08/11/2019, 10:26 AM

## 2019-08-11 NOTE — Progress Notes (Signed)
ANTICOAGULATION CONSULT NOTE - Initial Consult  Pharmacy Consult for lovenox Indication: r/o PE, DVT  Allergies  Allergen Reactions  . Feldene [Piroxicam]     Kidney issues  . Clinoril [Sulindac] Rash  . Morphine And Related Nausea And Vomiting    Patient Measurements: Height: 5\' 2"  (157.5 cm) Weight: 150 lb (68 kg)(patient reported) IBW/kg (Calculated) : 50.1  Vital Signs: Temp: 97.5 F (36.4 C) (10/13 0322) Temp Source: Oral (10/13 0322) BP: 145/76 (10/13 0804) Pulse Rate: 57 (10/13 0804)  Labs: Recent Labs    08/10/19 1900 08/11/19 0331 08/11/19 0517 08/11/19 0749  HGB 11.2*  --   --  11.6*  HCT 36.1  --   --  37.2  PLT 230  --   --  234  CREATININE 1.14*  --   --  0.91  TROPONINIHS  --  6 5  --     Estimated Creatinine Clearance: 39.4 mL/min (by C-G formula based on SCr of 0.91 mg/dL).   Medical History: Past Medical History:  Diagnosis Date  . Arthritis   . Benign essential tremor   . Cervical spondylosis   . Cervicogenic headache   . CHF (congestive heart failure) ([place])   . Chronic renal insufficiency   . Degenerative disc disease   . Depression   . Fibromyalgia   . Gait disorder   . GERD (gastroesophageal reflux disease)   . Heart murmur   . HOH (hard of hearing)   . Hypertension   . Lumbar spondylosis   . Migraine headache   . Mild obesity   . Osteopenia   . Rheumatoid arthritis (HCC)     Assessment: 65 yof presented to the ED s/p syncope. She has a history of PE and DVT but is no longer on anticoagulation. To start empiric lovenox while awaiting imaging for r/o PE and DVT. Baseline Hgb is slightly low and platelets are WNL.   Goal of Therapy:  Anti-Xa level 0.6-1 units/ml 4hrs after LMWH dose given Monitor platelets by anticoagulation protocol: Yes   Plan:  Lovenox 1mg /kg SQ Q12H F/u CT and dopplers F/u renal fxn, S&S of bleeding  Hennessey Cantrell, Rande Lawman 08/11/2019,9:42 AM

## 2019-08-11 NOTE — ED Notes (Signed)
Patient transported to CT 

## 2019-08-11 NOTE — Progress Notes (Signed)
Patient arrived to [facility] with ED RN, patient said she lost her dentures in the ED.  Floor RN, tech, and ED RN looked in bed under patient but no dentures were found. Per patient she had been asking for it in the ED all day.

## 2019-08-11 NOTE — ED Notes (Signed)
Echo technician at bedside.

## 2019-08-11 NOTE — H&P (Signed)
History and Physical    Dominique Mckee [id number] DOB: [birth year] DOA: 08/10/2019  Referring MD/NP/PA: Joseph Berkshire, MD PCP: Wenda Low, MD  Patient coming from: home via EMS  Chief Complaint: Syncope  I have personally briefly reviewed patient's old medical records in [place]   HPI: Dominique Mckee is a 85 y.o. female with medical history significant of hypertension, diastolic CHF, heart murmur, rheumatoid arthritis on methotrexate, and DVT/PE no longer on anticoagulation; who presents after having a syncopal episode at home.  At baseline patient reports being wheelchair-bound related to previous back surgery.  A CNA comes twice daily and her son usually stays to help take care of her.  The syncopal episode occurred yesterday while she was having a bowel movement.  Denies straining and normally has a bowel movement every other day.  She recalls looking up at the CNA and hearing her asked her what is wrong prior to losing consciousness.  Unknown how long patient was unconscious.  The CNA prevented her from falling and called 911 immediately.  Over the last few months she has been complaining of having lightheadedness with changes in position usually in the morning.  Also notes feeling significantly weak after having bowel movements.  Associated symptoms include left leg pain and urinary frequency.  Denies having any chest pain, leg swelling, shortness of breath, focal weakness, dysuria, or abdominal pain.  Patient previously suffered a pulmonary embolus approximately 10 months ago.  Reports being on anticoagulation up until about 1-2 months ago when her primary care physician discontinued it and placed her on daily aspirin.  ED Course:Upon admission into the hospital patient was seen to be afebrile, pulse 54-59, and all other vital signs maintained.  Orthostatic vital signs were also noted to be negative prior to IV fluids.  Labs significant for hemoglobin 11.2, MCV 107.8, BUN  32, creatinine 1.14, glucose 234, and troponin negative x2.  Urinalysis did not show any acute signs of infection.  Patient received 500 mL of normal saline IV fluids. TRH called to admit.  Review of Systems  Constitutional: Positive for malaise/fatigue. Negative for fever and weight loss.  HENT: Negative for congestion and nosebleeds.   Eyes: Negative for photophobia and pain.  Respiratory: Negative for cough and shortness of breath.   Cardiovascular: Negative for chest pain and leg swelling.  Gastrointestinal: Negative for abdominal pain, diarrhea, nausea and vomiting.  Genitourinary: Positive for frequency. Negative for dysuria.  Musculoskeletal: Positive for back pain and myalgias.  Skin: Negative for rash.  Neurological: Positive for dizziness, tremors, loss of consciousness and weakness.  Psychiatric/Behavioral: Negative for substance abuse. The patient does not have insomnia.     Past Medical History:  Diagnosis Date  . Arthritis   . Benign essential tremor   . Cervical spondylosis   . Cervicogenic headache   . CHF (congestive heart failure) ([place])   . Chronic renal insufficiency   . Degenerative disc disease   . Depression   . Fibromyalgia   . Gait disorder   . GERD (gastroesophageal reflux disease)   . Heart murmur   . HOH (hard of hearing)   . Hypertension   . Lumbar spondylosis   . Migraine headache   . Mild obesity   . Osteopenia   . Rheumatoid arthritis [facility])     Past Surgical History:  Procedure Laterality Date  . ABDOMINAL HYSTERECTOMY    . APPENDECTOMY    . CHOLECYSTECTOMY    . COLONOSCOPY    . ELBOW  SURGERY Left   . HAMMER TOE SURGERY Left   . LUMBAR FUSION  02/2017  . SHOULDER CLOSED REDUCTION Right 07/08/2015   Procedure: RIGHT SHOULDER CLOSED REDUCTION;  Surgeon: Meredith Pel, MD;  Location: [place];  Service: Orthopedics;  Laterality: Right;  . SHOULDER CLOSED REDUCTION Left 08/23/2017   Procedure: CLOSED REDUCTION LEFT SHOULDER;  Surgeon:  Meredith Pel, MD;  Location: [place];  Service: Orthopedics;  Laterality: Left;  . TONSILLECTOMY    . TOTAL KNEE ARTHROPLASTY Right    right     reports that she has never smoked. She has never used smokeless tobacco. She reports that she does not drink alcohol or use drugs.  Allergies  Allergen Reactions  . Feldene [Piroxicam]     Kidney issues  . Clinoril [Sulindac] Rash  . Morphine And Related Nausea And Vomiting    Family History  Problem Relation Age of Onset  . Osteoporosis Mother   . Intracerebral hemorrhage Mother 77  . AAA (abdominal aortic aneurysm) Father 61  . Lung cancer Brother   . Diabetes Brother   . Lung cancer Brother   . Diabetes Sister   . Lung cancer Sister   . Bone cancer Sister   . Kidney failure Son        left kidney transplant  . Diabetes type I Daughter   . Anesthesia problems Neg Hx   . Hypertension Neg Hx     Prior to Admission medications   Medication Sig Start Date End Date Taking? Authorizing Provider  acetaminophen (TYLENOL) 500 MG tablet Take 500 mg by mouth every 6 (six) hours as needed for mild pain.    [provider]  apixaban (ELIQUIS) 5 MG TABS tablet Take 1 tablet (5 mg total) by mouth 2 (two) times daily. 11/15/18   Hennie Duos, MD  Calcium Carb-Cholecalciferol (CALCIUM 600+D3) 600-200 MG-UNIT TABS Take 1 tablet by mouth daily. 11/15/18   Hennie Duos, MD  calcium carbonate (TUMS - DOSED IN MG ELEMENTAL CALCIUM) 500 MG chewable tablet Chew 1 tablet by mouth 2 (two) times daily as needed for indigestion.     [provider]  diclofenac sodium (VOLTAREN) 1 % GEL Apply 4 g topically 2 (two) times daily. To each shoulder 11/15/18   Hennie Duos, MD  DULoxetine (CYMBALTA) 30 MG capsule Take 1 capsule (30 mg total) by mouth daily. 11/15/18   Hennie Duos, MD  ferrous sulfate 325 (65 FE) MG tablet Take 1 tablet (325 mg total) by mouth every evening. 11/15/18   Hennie Duos, MD  folic acid  (FOLVITE) 1 MG tablet Take 1 tablet (1 mg total) by mouth daily. 11/15/18   Hennie Duos, MD  furosemide (LASIX) 20 MG tablet Take 1 tablet (20 mg total) by mouth daily. 11/15/18   Hennie Duos, MD  gabapentin (NEURONTIN) 300 MG capsule Take 3 capsules (900 mg total) by mouth at bedtime. 11/15/18   Hennie Duos, MD  methotrexate (RHEUMATREX) 2.5 MG tablet Take 10 tablets (25 mg total) by mouth once a week. Caution:Chemotherapy. Protect from light. [facility] 11/15/18   Hennie Duos, MD  nystatin cream (MYCOSTATIN) Apply 1 application topically 2 (two) times daily. APPLY TOPICALLY TO GROIN AREA ONCE DAILY 11/15/18   Hennie Duos, MD  ondansetron (ZOFRAN) 4 MG tablet Take 1 tablet (4 mg total) by mouth every 6 (six) hours as needed for nausea. 11/15/18   Hennie Duos, MD  pantoprazole ([place]) 40  MG tablet Take 1 tablet (40 mg total) by mouth daily. 11/15/18   Hennie Duos, MD  predniSONE (DELTASONE) 5 MG tablet Take 5 mg by mouth daily with breakfast.    [provider]  propranolol (INDERAL) 20 MG tablet Take 1 tablet (20 mg total) by mouth 2 (two) times daily. 11/15/18   Hennie Duos, MD    Physical Exam:  Constitutional: Elderly female who appears to be in NAD, calm, comfortable Vitals:   08/11/19 0345 08/11/19 0430 08/11/19 0552 08/11/19 0600  BP:  (!) 153/62 (!) 155/76 (!) 161/68  Pulse: (!) 56 (!) 55 (!) 54 (!) 54  Resp: 12 16 19 12   Temp:      TempSrc:      SpO2: 96% 96% 98% 97%   Eyes: PERRL, lids and conjunctivae normal ENMT: Mucous membranes are moist. Posterior pharynx clear of any exudate or lesions.  Neck: normal, supple, no masses, no thyromegaly Respiratory: clear to auscultation bilaterally, no wheezing, no crackles. Normal respiratory effort. No accessory muscle use.  Cardiovascular: Bradycardic, no murmurs / rubs / gallops. No extremity edema. 2+ pedal pulses. No carotid bruits.  Abdomen: no tenderness, no masses palpated. No  hepatosplenomegaly. Bowel sounds positive.  Musculoskeletal: no clubbing / cyanosis. No joint deformity upper and lower extremities. Good ROM, no contractures. Normal muscle tone.  Skin: no rashes, lesions, ulcers. No induration Neurologic: CN 2-12 grossly intact. Sensation intact, DTR normal. Strength 5/5 in all 4.  Psychiatric: Normal judgment and insight. Alert and oriented x 3. Normal mood.     Labs on Admission: I have personally reviewed following labs and imaging studies  CBC: Recent Labs  Lab 08/10/19 1900  WBC 5.9  HGB 11.2*  HCT 36.1  MCV 107.8*  PLT [id number]   Basic Metabolic Panel: Recent Labs  Lab 08/10/19 1900 08/11/19 0331  NA 142  --   K 3.8  --   CL 104  --   CO2 26  --   GLUCOSE 234*  --   BUN 32*  --   CREATININE 1.14*  --   CALCIUM 8.5*  --   MG  --  2.2   GFR: CrCl cannot be calculated (Unknown ideal weight.). Liver Function Tests: No results for input(s): AST, ALT, ALKPHOS, BILITOT, PROT, ALBUMIN in the last 168 hours. No results for input(s): LIPASE, AMYLASE in the last 168 hours. No results for input(s): AMMONIA in the last 168 hours. Coagulation Profile: No results for input(s): INR, PROTIME in the last 168 hours. Cardiac Enzymes: No results for input(s): CKTOTAL, CKMB, CKMBINDEX, TROPONINI in the last 168 hours. BNP (last 3 results) No results for input(s): PROBNP in the last 8760 hours. HbA1C: No results for input(s): HGBA1C in the last 72 hours. CBG: Recent Labs  Lab 08/11/19 0315  GLUCAP 94   Lipid Profile: No results for input(s): CHOL, HDL, LDLCALC, TRIG, CHOLHDL, LDLDIRECT in the last 72 hours. Thyroid Function Tests: No results for input(s): TSH, T4TOTAL, FREET4, T3FREE, THYROIDAB in the last 72 hours. Anemia Panel: No results for input(s): VITAMINB12, FOLATE, FERRITIN, TIBC, IRON, RETICCTPCT in the last 72 hours. Urine analysis:    Component Value Date/Time   COLORURINE YELLOW 08/11/2019 [place] 08/11/2019  0552   LABSPEC 1.019 08/11/2019 0552   PHURINE 5.0 08/11/2019 [place] 08/11/2019 [place] 08/11/2019 [place] 08/11/2019 [place] 08/11/2019 [place] 08/11/2019 [id number]  UROBILINOGEN 0.2 01/25/2012 1509   NITRITE NEGATIVE 08/11/2019 0552   LEUKOCYTESUR NEGATIVE 08/11/2019 0552   Sepsis Labs: No results found for this or any previous visit (from the past 240 hour(s)).   Radiological Exams on Admission: No results found.  EKG: Independently reviewed.  Sinus bradycardia at 55 bpm with right bundle branch block  Assessment/Plan Syncope: Acute.  Patient presents after having syncopal episode while having bowel movement.  Reports feeling lightheaded previous bowel movements as well.  Suspect vasovagal syncope as the likely cause.  However other possibilities include PE given prior history, arrhythmia given history of CHF with murmur, bradycardia with patient on propranolol, and/or dehydration given elevated BUN to creatinine ratio. -Admit to a medical telemetry bed -Check d-dimer(1.66) and chest x-ray -Check echocardiogram -Consider need for Holter monitor at discharge -Follow-up telemetry overnight -Check CTA of the chest to rule out PE and Doppler ultrasound of the bilateral lower extremities  Acute kidney injury: Resolving.  Baseline creatinine previously noted to be around 0.7 the beginning of this year.  Patient presents with creatinine elevated up to 1.14 with BUN 32 on 10/12.  Urinalysis unremarkable.  Suspect prerenal cause of symptoms and that likely may be associated with hyperglycemia and/or diuretic. -Normal saline IV fluids 75 ml/hr x 1 liter -Continue to monitor kidney (creatinine 0.91 on 10/13) -Hold nephrotoxic agents such as diuretic  Hyperglycemia: Acute.  Patient's initial blood glucose elevated up to 234 on admission.  Last hemoglobin A1c on file from 2012 was 6.4.  She has been on  prednisone at one point time for treatment of rheumatoid arthritis.  Question acute stress response versus uncontrolled diabetes.  -Hypoglycemic protocols -Check hemoglobin A1c -CBGs. before meals with sensitive SSI but we will discontinue with -Will discontinue sliding scale insulin if globin A1c within normal limits  Macrocytic anemia: Hemoglobin 1.2 with elevated MCV of 108.7.  Patient denies any reports of bleeding and is already taking ferrous sulfate and folic acid. -Check repeat CBC, vitamin B12, and folate -Continue ferrous sulfate and folic acid in outpatient setting  Rheumatoid arthritis: Patient on methotrexate and prednisone 5 mg daily. -Continue  methotrexate   Essential hypertension/tremor: Blood pressure initially elevated up to 161/68.  Medications include propranolol and son reports patient is on Lasix at home(not present on med reconciliation).  -Continue propranolol with holding parameters - Verify whether patient on Lasix or not  Sinus bradycardia, right bundle branch block: EKG noting heart rates in the 50s with right bundle branch block similar to previous EKG tracings. -Continue to monitor   Diastolic CHF, history of murmur: Patient clinically appears to be constipated at this time.  Last EF was noted to be around 60 to 65% with grade 1 diastolic dysfunction in [id number].   -Check daily weights and monitor I&Os -Continue beta-blocker  History of DVT/PE: In 12//2019 patient was found to have bilateral DVT and PE.  She had been on anticoagulation up until approximately 1-2 months ago stopped by primary care provider.   Hypokalemia: Potassium 3.3  -Give 40 mEq of potassium chloride x1 dose now -Continue to monitor and replace as needed  Gait abnormality and debility: Patient reports being wheelchair-bound given previous history of back surgery. -Consider physical therapy eval if work-up negative  Depression and chronic pain -Continue Cymbalta and gabapentin  GERD  -Continue Protonix   DVT prophylaxis: Lovenox for VTE treatment Code Status: DNR Family Communication: Discussed plan of care with the patient's son over the Disposition Plan: Likely discharge home in a.m. Consults  called: None Admission status: Observation  Norval Morton MD [facility] Pager [phone]   If 7PM-7AM, please contact night-coverage [email or web address] Password [email or web address]  08/11/2019, 7:05 AM

## 2019-08-11 NOTE — ED Notes (Signed)
Per patient's request, Patient's son, [name] contacted with update regarding patient's status and request for items that patient wanted him to bring.

## 2019-08-11 NOTE — ED Provider Notes (Signed)
[facility] Provider Note   CSN: [id number] Arrival date & time: 08/10/19  1843     History   Chief Complaint Chief Complaint  Patient presents with   Loss of Consciousness    HPI Dominique Mckee is a 85 y.o. female.     Patient presents to the emergency department for evaluation of syncope.  Patient had an episode of syncope today while having a bowel movement.  Over the last couple of months she has been having multiple episodes of dizziness and near syncope with bowel movements.  Today is the first time she actually passed out.  Patient has any associated chest pain, shortness of breath, focal neurologic symptoms.  She is back to her normal baseline.     Past Medical History:  Diagnosis Date   Arthritis    Benign essential tremor    Cervical spondylosis    Cervicogenic headache    CHF (congestive heart failure) (HCC)    Chronic renal insufficiency    Degenerative disc disease    Depression    Fibromyalgia    Gait disorder    GERD (gastroesophageal reflux disease)    Heart murmur    HOH (hard of hearing)    Hypertension    Lumbar spondylosis    Migraine headache    Mild obesity    Osteopenia    Rheumatoid arthritis ([place])     Patient Active Problem List   Diagnosis Date Noted   Acute pulmonary embolism ([place]) 11/15/2018   Acute deep vein thrombosis (DVT) of femoral vein of right lower extremity ([place]) 10/29/2018   Acute deep vein thrombosis (DVT) of tibial vein of both lower extremities (HCC) 10/29/2018   Paroxysmal atrial fibrillation ([place]) 10/29/2018   Acute hypokalemia 10/29/2018   Hypomagnesemia 10/29/2018   Polyneuropathy 10/29/2018   Weakness generalized    Palliative care by specialist    DNR (do not resuscitate)    Neck pain    Hypoxia 10/21/2018   Nausea and vomiting 10/21/2018   Chronic diastolic CHF (congestive heart failure) ([place]) 10/21/2018   Status post lumbar spinal  fusion 04/13/2017   Degenerative lumbar spinal stenosis 03/15/2017   CKD (chronic kidney disease) 01/11/2017   Anemia 01/10/2017   Chronic pain 01/10/2017   Heart murmur [id number]   Acute diastolic congestive heart failure ([place]) 07/08/2015   Acute respiratory failure with hypoxia ([place]) 07/08/2015   Shoulder dislocation, recurrent 07/06/2015   HTN (hypertension) 07/06/2015   Rheumatoid arthritis ([place]) 07/06/2015   GERD (gastroesophageal reflux disease) 07/06/2015   Shoulder dislocation    Concussion with no loss of consciousness 12/30/2013   Cervical spondylosis without myelopathy 01/01/2013   Migraine without aura 01/01/2013   Abnormality of gait 01/01/2013   Essential and other specified forms of tremor 01/01/2013    Past Surgical History:  Procedure Laterality Date   ABDOMINAL HYSTERECTOMY     APPENDECTOMY     CHOLECYSTECTOMY     COLONOSCOPY     ELBOW SURGERY Left    HAMMER TOE SURGERY Left    LUMBAR FUSION  02/2017   SHOULDER CLOSED REDUCTION Right 07/08/2015   Procedure: RIGHT SHOULDER CLOSED REDUCTION;  Surgeon: Meredith Pel, MD;  Location: [facility];  Service: Orthopedics;  Laterality: Right;   SHOULDER CLOSED REDUCTION Left 08/23/2017   Procedure: CLOSED REDUCTION LEFT SHOULDER;  Surgeon: Meredith Pel, MD;  Location: [facility];  Service: Orthopedics;  Laterality: Left;   TONSILLECTOMY     TOTAL KNEE ARTHROPLASTY Right  right     OB History    Gravida      Para      Term      Preterm      AB      Living  1     SAB      TAB      Ectopic      Multiple      Live Births               Home Medications    Prior to Admission medications   Medication Sig Start Date End Date Taking? Authorizing Provider  acetaminophen (TYLENOL) 500 MG tablet Take 500 mg by mouth every 6 (six) hours as needed for mild pain.    [provider]  apixaban (ELIQUIS) 5 MG TABS tablet Take 1 tablet (5 mg total) by mouth 2 (two)  times daily. 11/15/18   Hennie Duos, MD  Calcium Carb-Cholecalciferol (CALCIUM 600+D3) 600-200 MG-UNIT TABS Take 1 tablet by mouth daily. 11/15/18   Hennie Duos, MD  calcium carbonate (TUMS - DOSED IN MG ELEMENTAL CALCIUM) 500 MG chewable tablet Chew 1 tablet by mouth 2 (two) times daily as needed for indigestion.     [provider]  diclofenac sodium (VOLTAREN) 1 % GEL Apply 4 g topically 2 (two) times daily. To each shoulder 11/15/18   Hennie Duos, MD  DULoxetine (CYMBALTA) 30 MG capsule Take 1 capsule (30 mg total) by mouth daily. 11/15/18   Hennie Duos, MD  ferrous sulfate 325 (65 FE) MG tablet Take 1 tablet (325 mg total) by mouth every evening. 11/15/18   Hennie Duos, MD  folic acid (FOLVITE) 1 MG tablet Take 1 tablet (1 mg total) by mouth daily. 11/15/18   Hennie Duos, MD  furosemide (LASIX) 20 MG tablet Take 1 tablet (20 mg total) by mouth daily. 11/15/18   Hennie Duos, MD  gabapentin (NEURONTIN) 300 MG capsule Take 3 capsules (900 mg total) by mouth at bedtime. 11/15/18   Hennie Duos, MD  methotrexate (RHEUMATREX) 2.5 MG tablet Take 10 tablets (25 mg total) by mouth once a week. Caution:Chemotherapy. Protect from light. [facility] 11/15/18   Hennie Duos, MD  nystatin cream (MYCOSTATIN) Apply 1 application topically 2 (two) times daily. APPLY TOPICALLY TO GROIN AREA ONCE DAILY 11/15/18   Hennie Duos, MD  ondansetron (ZOFRAN) 4 MG tablet Take 1 tablet (4 mg total) by mouth every 6 (six) hours as needed for nausea. 11/15/18   Hennie Duos, MD  pantoprazole (PROTONIX) 40 MG tablet Take 1 tablet (40 mg total) by mouth daily. 11/15/18   Hennie Duos, MD  predniSONE (DELTASONE) 5 MG tablet Take 5 mg by mouth daily with breakfast.    [provider]  propranolol (INDERAL) 20 MG tablet Take 1 tablet (20 mg total) by mouth 2 (two) times daily. 11/15/18   Hennie Duos, MD    Family History Family History  Problem  Relation Age of Onset   Osteoporosis Mother    Intracerebral hemorrhage Mother 75   AAA (abdominal aortic aneurysm) Father 45   Lung cancer Brother    Diabetes Brother    Lung cancer Brother    Diabetes Sister    Lung cancer Sister    Bone cancer Sister    Kidney failure Son        left kidney transplant   Diabetes type I Daughter    Anesthesia problems Neg  Hx    Hypertension Neg Hx     Social History Social History   Tobacco Use   Smoking status: Never Smoker   Smokeless tobacco: Never Used  Substance Use Topics   Alcohol use: No   Drug use: No     Allergies   Feldene [piroxicam], Clinoril [sulindac], and Morphine and related   Review of Systems Review of Systems  Neurological: Positive for syncope.  All other systems reviewed and are negative.    Physical Exam Updated Vital Signs BP (!) 155/76 (BP Location: Right Arm)    Pulse (!) 54    Temp (!) 97.5 F (36.4 C) (Oral)    Resp 19    SpO2 98%   Physical Exam Vitals signs and nursing note reviewed.  Constitutional:      General: She is not in acute distress.    Appearance: Normal appearance. She is well-developed.  HENT:     Head: Normocephalic and atraumatic.     Right Ear: Hearing normal.     Left Ear: Hearing normal.     Nose: Nose normal.  Eyes:     Conjunctiva/sclera: Conjunctivae normal.     Pupils: Pupils are equal, round, and reactive to light.  Neck:     Musculoskeletal: Normal range of motion and neck supple.  Cardiovascular:     Rate and Rhythm: Regular rhythm.     Heart sounds: S1 normal and S2 normal. No murmur. No friction rub. No gallop.   Pulmonary:     Effort: Pulmonary effort is normal. No respiratory distress.     Breath sounds: Normal breath sounds.  Chest:     Chest wall: No tenderness.  Abdominal:     General: Bowel sounds are normal.     Palpations: Abdomen is soft.     Tenderness: There is no abdominal tenderness. There is no guarding or rebound. Negative  signs include Murphy's sign and McBurney's sign.     Hernia: No hernia is present.  Musculoskeletal: Normal range of motion.  Skin:    General: Skin is warm and dry.     Findings: No rash.  Neurological:     Mental Status: She is alert and oriented to person, place, and time.     GCS: GCS eye subscore is 4. GCS verbal subscore is 5. GCS motor subscore is 6.     Cranial Nerves: No cranial nerve deficit.     Sensory: No sensory deficit.     Coordination: Coordination normal.  Psychiatric:        Speech: Speech normal.        Behavior: Behavior normal.        Thought Content: Thought content normal.      ED Treatments / Results  Labs (all labs ordered are listed, but only abnormal results are displayed) Labs Reviewed  BASIC METABOLIC PANEL - Abnormal; Notable for the following components:      Result Value   Glucose, Bld 234 (*)    BUN 32 (*)    Creatinine, Ser 1.14 (*)    Calcium 8.5 (*)    GFR calc non Af Amer 43 (*)    GFR calc Af Amer 50 (*)    All other components within normal limits  CBC - Abnormal; Notable for the following components:   RBC 3.35 (*)    Hemoglobin 11.2 (*)    MCV 107.8 (*)    All other components within normal limits  URINALYSIS, ROUTINE W REFLEX MICROSCOPIC  MAGNESIUM  BRAIN NATRIURETIC PEPTIDE  CBG MONITORING, ED  TROPONIN I (HIGH SENSITIVITY)  TROPONIN I (HIGH SENSITIVITY)    EKG EKG Interpretation  Date/Time:  Tuesday August 11 2019 03:20:13 EDT Ventricular Rate:  55 PR Interval:    QRS Duration: 153 QT Interval:  482 QTC Calculation: 461 R Axis:   102 Text Interpretation:  Sinus rhythm Right bundle branch block Confirmed by Orpah Greek [phone]) on 08/11/2019 3:48:00 AM   Radiology No results found.  Procedures Procedures (including critical care time)  Medications Ordered in ED Medications  sodium chloride flush (NS) 0.9 % injection 3 mL (has no administration in time range)  sodium chloride 0.9 % bolus 500 mL  (0 mLs Intravenous Stopped 08/11/19 0644)     Initial Impression / Assessment and Plan / ED Course  I have reviewed the triage vital signs and the nursing notes.  Pertinent labs & imaging results that were available during my care of the patient were reviewed by me and considered in my medical decision making (see chart for details).        Patient presents to the emergency department for evaluation of syncope.  Patient has been experiencing frequent near syncope episodes associated with defecation over the last couple of months.  Tonight she apparently did briefly lose consciousness but did not fall or injure herself.  No cardiac symptoms prior to episode.  Patient is back to her normal baseline.  It sounds as though she had a very low blood pressure initially as family could not find pulses after she passed out.  She recovered without CPR or any intervention.  History of frequent near syncope with defecation and tonight's syncopal episode are very consistent with vasovagal syncope.  Patient did have an elevated BUN without orthostatic changes on vital signs.  Was given gentle fluid hydration for mild dehydration.  EKG does not show any worrisome changes.  Troponin negative x2.  Urinalysis unremarkable.  Work-up has been entirely unrevealing and she appears well with normal vital signs.  She does have a noted history of PE, however no complaints of shortness of breath.  No tachycardia, tachypnea or hypoxia.  Patient has been compliant with her Eliquis.  Doubt recurrent PE.    At the severity of her symptoms and the fact that she does have congestive heart failure (fails [place] syncope rule) patient would benefit from observation in the hospital.  Final Clinical Impressions(s) / ED Diagnoses   Final diagnoses:  Syncope, unspecified syncope type    ED Discharge Orders    None       Orpah Greek, MD 08/11/19 [phone]

## 2019-08-11 NOTE — ED Notes (Signed)
CBG Results of 94 reported to [place], RN.

## 2019-08-11 NOTE — Progress Notes (Signed)
  Echocardiogram 2D Echocardiogram has been performed.  Ange Puskas L Androw 08/11/2019, 2:09 PM

## 2019-08-11 NOTE — Progress Notes (Signed)
Venous lower ext study  has been completed. Refer to [facility] under chart review to view preliminary results.   08/11/2019  4:21 PM Nalee Lightle, Bonnye Fava

## 2019-08-12 DIAGNOSIS — R55 Syncope and collapse: Secondary | ICD-10-CM

## 2019-08-12 LAB — FOLATE RBC
Folate, Hemolysate: 340 ng/mL
Folate, RBC: 1021 ng/mL (ref >498)
Hematocrit: 33.3 % — ABNORMAL LOW (ref 34.0–46.6)

## 2019-08-12 LAB — BASIC METABOLIC PANEL
Anion gap: 9 (ref 5–15)
BUN: 20 mg/dL (ref 8–23)
CO2: 25 mmol/L (ref 22–32)
Calcium: 8.6 mg/dL — ABNORMAL LOW (ref 8.9–10.3)
Chloride: 108 mmol/L (ref 98–111)
Creatinine, Ser: 0.92 mg/dL (ref 0.44–1.00)
GFR calc Af Amer: >60 mL/min (ref >60)
GFR calc non Af Amer: 56 mL/min — ABNORMAL LOW (ref >60)
Glucose, Bld: 88 mg/dL (ref 70–99)
Potassium: 3.9 mmol/L (ref 3.5–5.1)
Sodium: 142 mmol/L (ref 135–145)

## 2019-08-12 LAB — GLUCOSE, CAPILLARY
Glucose-Capillary: 91 mg/dL (ref 70–99)
Glucose-Capillary: 122 mg/dL — ABNORMAL HIGH (ref 70–99)

## 2019-08-12 MED ORDER — APIXABAN 5 MG PO TABS: 5.0000 mg | ORAL_TABLET | Freq: Two times a day (BID) | ORAL | 0 refills | Status: AC

## 2019-08-12 MED ORDER — PROPRANOLOL HCL 10 MG PO TABS
10.0000 mg | ORAL_TABLET | Freq: Two times a day (BID) | ORAL | Status: DC
  Filled 2019-08-12: qty 1

## 2019-08-12 MED ORDER — APIXABAN 5 MG PO TABS: 5.0000 mg | ORAL_TABLET | Freq: Two times a day (BID) | ORAL | Status: DC

## 2019-08-12 MED ORDER — PROPRANOLOL HCL 20 MG PO TABS: 10.0000 mg | ORAL_TABLET | Freq: Two times a day (BID) | ORAL | 0 refills | Status: DC

## 2019-08-12 NOTE — Plan of Care (Signed)
  Problem: Safety: Goal: Ability to remain free from injury will improve Outcome: Progressing   Problem: Activity: Goal: Capacity to carry out activities will improve Outcome: Progressing   

## 2019-08-12 NOTE — TOC Transition Note (Addendum)
Transition of Care [facility]) - CM/SW Discharge Note   Patient Details  Name: Dominique Mckee MRN: [id number] Date of Birth: 10-Apr-1930  Transition of Care [facility]) CM/SW Contact:  Zenon Mayo, RN Phone Number: 08/12/2019, 1:08 PM   Clinical Narrative:    Patient for dc today, will need HHPT, NCM offered choice, she chose [facility], referral given to [facility] with [facility].  Awaiting call back to see if she can take referral .  Mateo Flow states they can take referral with soc on Sunday.     Final next level of care: [place] Barriers to Discharge: No Barriers Identified   Patient Goals and CMS Choice Patient states their goals for this hospitalization and ongoing recovery are:: go home CMS Medicare.gov Compare Post Acute Care list provided to:: Patient Choice offered to / list presented to : Patient  Discharge Placement                       Discharge Plan and Services                DME Arranged: (NA)         HH Arranged: PT HH Agency: [facility] ([facility]) Date [place]: 08/12/19 Time [place]: 1308 Representative spoke with at [place]: [place] ([place]) Interventions     Readmission Risk Interventions No flowsheet data found.

## 2019-08-12 NOTE — Care Management Obs Status (Signed)
MEDICARE OBSERVATION STATUS NOTIFICATION   Patient Details  Name: Dominique Mckee MRN: [id number] Date of Birth: May 06, 1930   Medicare Observation Status Notification Given:  Yes    Zenon Mayo, RN 08/12/2019, 1:04 PM

## 2019-08-12 NOTE — Progress Notes (Signed)
ANTICOAGULATION CONSULT NOTE - Follow Up Consult  Pharmacy Consult for Coumadin Indication: chronic dVT  Allergies  Allergen Reactions  . Feldene [Piroxicam]     Kidney issues  . Clinoril [Sulindac] Rash  . Morphine And Related Nausea And Vomiting    Patient Measurements: Height: 5\' 3"  (160 cm) Weight: 154 lb 8.7 oz (70.1 kg) IBW/kg (Calculated) : 52.4  Vital Signs: Temp: 97.5 F (36.4 C) (10/14 0835) Temp Source: Oral (10/14 0835) BP: 129/68 (10/14 0841) Pulse Rate: 51 (10/14 0841)  Labs: Recent Labs    08/10/19 1900 08/11/19 0331 08/11/19 0517 08/11/19 0749 08/12/19 0608  HGB 11.2*  --   --  11.6*  --   HCT 36.1  --   --  37.2  --   PLT 230  --   --  234  --   CREATININE 1.14*  --   --  0.91 0.92  TROPONINIHS  --  6 5  --   --     Estimated Creatinine Clearance: 40.5 mL/min (by C-G formula based on SCr of 0.92 mg/dL).   Assessment:  Anticoag: Lovenox for r/o DVT/PE - Hgb 11.6, pts WNL - apixaban stopped recently for h/o DVT. Scr 0.92 WNL  - 10/13: CT negative for PE - 10/13 Dopplers: chronic deep vein thrombosis involving the right femoral vein, right popliteal vein, right posterior tibial veins, and right peroneal veins. Left leg is negative  Goal of Therapy:  Therapeutic oral anticoagulation   Plan:  D/c Lovenox: already received dose this AM Eliquis 5mg  BID  Zuleyka Kloc S. Alford [place], PharmD, BCPS Clinical Staff Pharmacist Eilene Ghazi [place] 08/12/2019,9:26 AM

## 2019-08-12 NOTE — Evaluation (Signed)
Physical Therapy Evaluation Patient Details Name: Dominique Mckee MRN: [id number] DOB: May 21, 1930 Today's Date: 08/12/2019   History of Present Illness   Dominique Mckee is a 85 y.o. female with medical history significant of hypertension, diastolic CHF, heart murmur, rheumatoid arthritis on methotrexate, and DVT/PE no longer on anticoagulation; who presents after having a syncopal episode at home.   Clinical Impression  Pt functioning near baseline. Pt able to complete std pvt to chair with min/modAx1. Pt strongly desires to return to walking. Pt to benefit from HHPT and RW to progress ambulation. Acute PT to cont to follow.    Follow Up Recommendations Home health PT;Supervision/Assistance - 24 hour    Equipment Recommendations  Rolling walker with 5" wheels    Recommendations for Other Services       Precautions / Restrictions Precautions Precautions: Fall Precaution Comments: externally rotated feet and LEs Restrictions Weight Bearing Restrictions: No      Mobility  Bed Mobility Overal bed mobility: Needs Assistance Bed Mobility: Supine to Sit     Supine to sit: HOB elevated;Mod assist     General bed mobility comments: pt initiated transfer, bringing LE off EOB, modA for trunk elevation and to swing hips around to EOB  Transfers Overall transfer level: Needs assistance Equipment used: 1 person hand held assist Transfers: Sit to/from [facility] Sit to Stand: Mod assist Stand pivot transfers: Min assist       General transfer comment: used gait belt, modA to power up, minA to steady pt during std pvt, pt able to advance LEs slowly but appropriately  Ambulation/Gait             General Gait Details: desires to ambulate again but hasn't walked in over a year "people are afraid to help me walk"  Stairs            Wheelchair Mobility    Modified Rankin (Stroke Patients Only)       Balance Overall balance assessment: Needs  assistance Sitting-balance support: Feet supported;No upper extremity supported Sitting balance-Leahy Scale: Fair Sitting balance - Comments: minA fo dynamic balance   Standing balance support: Bilateral upper extremity supported Standing balance-Leahy Scale: Poor Standing balance comment: dependent on physical assist or UE support                             Pertinent Vitals/Pain Pain Assessment: 0-10 Pain Score: 4  Pain Location: L hip and R ear Pain Descriptors / Indicators: Constant;Dull;Aching    Home Living Family/patient expects to be discharged to:: Private residence Living Arrangements: Children Available Help at Discharge: Family;Personal care attendant;Available 24 hours/day Type of Home: House Home Access: Ramped entrance     Home Layout: One level Home Equipment: Bedside commode;Tub bench;Wheelchair - manual;Toilet riser;Walker - 2 wheels;Grab bars - tub/shower Additional Comments: CNA assists with sponge bath at sink,    Prior Function Level of Independence: Needs assistance   Gait / Transfers Assistance Needed: assist for transfer to w/c, w/c bound, doesn't amb at all  ADL's / Homemaking Assistance Needed: assist with sponge baths at sink in w/c, assist for grocery shopping and cooking        Hand Dominance   Dominant Hand: Right    Extremity/Trunk Assessment   Upper Extremity Assessment Upper Extremity Assessment: Generalized weakness    Lower Extremity Assessment Lower Extremity Assessment: Generalized weakness    Cervical / Trunk Assessment Cervical / Trunk Assessment: Kyphotic  Communication   Communication: HOH  Cognition Arousal/Alertness: Awake/alert Behavior During Therapy: WFL for tasks assessed/performed Overall Cognitive Status: Within Functional Limits for tasks assessed                                 General Comments: able to follow all commands and sequence std pvt to chair      General Comments  General comments (skin integrity, edema, etc.): pt with bilat halux valgus    Exercises     Assessment/Plan    PT Assessment Patient needs continued PT services  PT Problem List Decreased strength;Decreased range of motion;Decreased activity tolerance;Decreased balance;Decreased mobility;Decreased coordination;Decreased knowledge of use of DME       PT Treatment Interventions DME instruction;Gait training;Stair training;Functional mobility training;Therapeutic activities;Therapeutic exercise;Balance training;Neuromuscular re-education    PT Goals (Current goals can be found in the Care Plan section)  Acute Rehab PT Goals Patient Stated Goal: to walk again PT Goal Formulation: With patient Time For Goal Achievement: 08/26/19 Potential to Achieve Goals: Good    Frequency Min 3X/week   Barriers to discharge        Co-evaluation               AM-PAC PT "6 Clicks" Mobility  Outcome Measure Help needed turning from your back to your side while in a flat bed without using bedrails?: A Lot Help needed moving from lying on your back to sitting on the side of a flat bed without using bedrails?: A Lot Help needed moving to and from a bed to a chair (including a wheelchair)?: A Lot Help needed standing up from a chair using your arms (e.g., wheelchair or bedside chair)?: A Little Help needed to walk in hospital room?: A Lot Help needed climbing 3-5 steps with a railing? : Total 6 Click Score: 12    End of Session Equipment Utilized During Treatment: Gait belt Activity Tolerance: Patient tolerated treatment well Patient left: in chair;with call bell/phone within reach;with chair alarm set Nurse Communication: Mobility status PT Visit Diagnosis: Unsteadiness on feet (R26.81);Difficulty in walking, not elsewhere classified (R26.2)    Time: [id number] PT Time Calculation (min) (ACUTE ONLY): 30 min   Charges:   PT Evaluation $PT Eval Moderate Complexity: 1 Mod PT  Treatments $Therapeutic Activity: 8-22 mins        Kittie Plater, PT, DPT Acute Rehabilitation Services Pager #: [phone] Office #: [phone]   Berline Lopes 08/12/2019, 1:10 PM

## 2019-08-12 NOTE — Discharge Instructions (Signed)

## 2019-08-12 NOTE — Progress Notes (Signed)
Discharge and medication education given with teach back. Questions and concerns answered. Peripheral iv removed and dressing applied. Pt's belongings with pt: gown, shoes and cell phone.  Pt was transported in wheelchair by nurse tech and nurse and helped her to the car.

## 2019-08-13 NOTE — Discharge Summary (Signed)
Physician Discharge Summary  Dominique Mckee [id number] DOB: 1929-12-21 DOA: 08/10/2019  PCP: Wenda Low, MD  Admit date: 08/10/2019 Discharge date: 08/12/2019  Time spent: 35 minutes  Recommendations for Outpatient Follow-up:  1. Chronic light right lower extremity DVTs noted, Eliquis resumed 2. Propranolol dose decreased due to mild bradycardia, likely exacerbating vasovagal syncope 3. PCP Dr. Deforest Hoyles in 1 week   Discharge Diagnoses:  Principal Problem:   Syncope and collapse Vasovagal syncope Chronic debility, gait disorder, wheelchair-bound at baseline   HTN (hypertension)   GERD (gastroesophageal reflux disease)   Anemia   Chronic pain   Acute hypokalemia   History of pulmonary embolus (PE) History of DVT  Discharge Condition: Stable  Diet recommendation: Heart healthy  Filed Weights   08/11/19 0900 08/11/19 1734 08/12/19 0340  Weight: 68 kg 71 kg 70.1 kg    History of present illness:  Dominique Mckee is a 85 y.o. female with medical history significant of hypertension, diastolic CHF, heart murmur, rheumatoid arthritis on methotrexate, and DVT/PE no longer on anticoagulation; who presents after having a syncopal episode at home.  At baseline patient reports being wheelchair-bound related to previous back surgery.  A CNA comes twice daily and her son usually stays to help take care of her.  The syncopal episode occurred yesterday while she was having a bowel movement.  Denies straining and normally has a bowel movement every other day.  She recalls looking up at the CNA and hearing her asked her what is wrong prior to losing consciousness.  Unknown how long patient was unconscious.  The CNA prevented her from falling and called 911 immediately.  Over the last few months she has been complaining of having lightheadedness with changes in position usually in the morning.  Also notes feeling significantly weak after having bowel movements.  Associated symptoms include left  leg pain and urinary frequency.  Denies having any chest pain, leg swelling, shortness of breath, focal weakness, dysuria, or abdominal pain.  Patient previously suffered a pulmonary embolus approximately 10 months ago.  Reports being on anticoagulation up until about 1-2 months ago when her primary care physician discontinued it and placed her on daily aspirin.  ED Course:Upon admission into the hospital patient was seen to be afebrile, pulse 54-59, and all other vital signs maintained.  Orthostatic vital signs were also noted to be negative prior to IV fluids.  Labs significant for hemoglobin 11.2, MCV 107.8, BUN 32, creatinine 1.14, glucose 234, and troponin negative x2.  Urinalysis did not show any acute signs of infection.  Patient received 500 mL of normal saline IV fluids. [facility] called to admit.  Hospital Course:   Syncope:  -Patient had a syncopal episode while straining to have a bowel movement, suspect vasovagal episode, orthostatics were negative, no events on telemetry except for mild sinus bradycardia, she is on propranolol 20 mg twice daily at baseline we decreased this dose down to 10 mg twice daily given the concern that worsening bradycardia from vagal stimulation could have compounded this,  -She did have an elevated d-dimer in the ER, subsequently underwent a CT angiogram which was negative for PE, 2D echocardiogram also showed preserved EF, no significant valvular disease, and some indentation from hiatal hernia was noted -PT OT evaluation completed, home health PT was recommended and set up prior to discharge  History of DVT, PE -DVT and PE was diagnosed in December 2019, completed course of Eliquis 1 to 2 months prior to this admission -Dopplers done on  admission in the ER noted chronic right leg DVT, given the fact that she is bed, wheelchair bound at baseline with this chronic DVT we elected to restart her on Eliquis without loading dose.  Recommend treatment for at least another  6 months 1 could argue for much longer therapy given her risk factors that persists  Borderline diabetes mellitus -Mild hyperglycemia noted, remains on prednisone 5 mg daily for RA -Hemoglobin A1c was 5.7 -Dietary modification advised  Macrocytic anemia: -Mild chronic and stable I suspect her macrocytic anemia secondary to chronic methotrexate Therapy, continued on folic acid and iron in the outpatient setting which was continued  Rheumatoid arthritis: Patient on methotrexate and prednisone 5 mg daily. -Continue  methotrexate   Essential hypertension/tremor: -Restarted propranolol at a lower dose due to sinus bradycardia  Diastolic CHF, history of murmur: Patient clinically appears to be constipated at this time.  Last EF was noted to be around 60 to 65% with grade 1 diastolic dysfunction in [id number].   -Continue beta-blocker, patient tells me she is not on diuretics at baseline, -Follow-up with PCP  Hypokalemia:  -Replaced  Gait abnormality and debility:  -She has bed, wheelchair bound at baseline  Depression and chronic pain -Continue Cymbalta and gabapentin  GERD -Continue Protonix  Discharge Exam: Vitals:   08/12/19 0841 08/12/19 1229  BP: 129/68 137/68  Pulse: (!) 51 61  Resp:  16  Temp:  98.2 F (36.8 C)  SpO2:  96%    General: AAOx3 Cardiovascular: S1S2/RRR Respiratory: CTAB  Discharge Instructions   Discharge Instructions    Diet - low sodium heart healthy   Complete by: As directed    Increase activity slowly   Complete by: As directed      Allergies as of 08/12/2019      Reactions   Feldene [piroxicam]    Kidney issues   Clinoril [sulindac] Rash   Morphine And Related Nausea And Vomiting      Medication List    TAKE these medications   acetaminophen 500 MG tablet Commonly known as: TYLENOL Take 500 mg by mouth every 6 (six) hours as needed for mild pain.   apixaban 5 MG Tabs tablet Commonly known as: ELIQUIS Take 1 tablet (5 mg  total) by mouth 2 (two) times daily.   Calcium Carb-Cholecalciferol 600-200 MG-UNIT Tabs Commonly known as: Calcium 600+D3 Take 1 tablet by mouth daily.   calcium carbonate 500 MG chewable tablet Commonly known as: TUMS - dosed in mg elemental calcium Chew 1 tablet by mouth 2 (two) times daily as needed for indigestion.   diclofenac sodium 1 % Gel Commonly known as: VOLTAREN Apply 4 g topically 2 (two) times daily. To each shoulder   DULoxetine 30 MG capsule Commonly known as: CYMBALTA Take 1 capsule (30 mg total) by mouth daily.   ferrous sulfate 325 (65 FE) MG tablet Take 1 tablet (325 mg total) by mouth every evening.   folic acid 1 MG tablet Commonly known as: FOLVITE Take 1 tablet (1 mg total) by mouth daily.   gabapentin 300 MG capsule Commonly known as: NEURONTIN Take 3 capsules (900 mg total) by mouth at bedtime.   methotrexate 2.5 MG tablet Commonly known as: RHEUMATREX Take 10 tablets (25 mg total) by mouth once a week. Caution:Chemotherapy. Protect from light. THURSDAYS   pantoprazole 40 MG tablet Commonly known as: PROTONIX Take 1 tablet (40 mg total) by mouth daily.   propranolol 20 MG tablet Commonly known as: INDERAL Take 0.5 tablets (10  mg total) by mouth 2 (two) times daily. What changed: how much to take      Allergies  Allergen Reactions  . Feldene [Piroxicam]     Kidney issues  . Clinoril [Sulindac] Rash  . Morphine And Related Nausea And Vomiting   Follow-up Information    Wenda Low, MD. Schedule an appointment as soon as possible for a visit on 08/19/2019.   Specialty: Internal Medicine Why: @10 :30am Contact information: [address] [place] [address] [phone]        [facility] Follow up.   Why: HHPT           The results of significant diagnostics from this hospitalization (including imaging, microbiology, ancillary and laboratory) are listed below for reference.    Significant  Diagnostic Studies: Ct Angio Chest Pe W Or Wo Contrast  Result Date: 08/11/2019 CLINICAL DATA:  Epigastric pain EXAM: CT ANGIOGRAPHY CHEST WITH CONTRAST TECHNIQUE: Multidetector CT imaging of the chest was performed using the standard protocol during bolus administration of intravenous contrast. Multiplanar CT image reconstructions and MIPs were obtained to evaluate the vascular anatomy. CONTRAST:  143mL OMNIPAQUE IOHEXOL 350 MG/ML SOLN COMPARISON:  10/21/2018 FINDINGS: Cardiovascular: Cardiomegaly. Diffuse coronary artery and aortic calcifications. No evidence of aortic aneurysm. No filling defects in the pulmonary arteries to suggest pulmonary emboli. Mediastinum/Nodes: Large hiatal hernia. No mediastinal, hilar, or axillary adenopathy. Lungs/Pleura: Ground-glass densities and septal thickening in the anterior lungs bilaterally compatible with scarring, stable. No effusions or acute confluent airspace opacities. Upper Abdomen: Imaging into the upper abdomen shows no acute findings. Musculoskeletal: Chest wall soft tissues unremarkable. No acute bony abnormality. Review of the MIP images confirms the above findings. IMPRESSION: No evidence of pulmonary embolus. Cardiomegaly. Areas of scarring in the lungs bilaterally. Large hiatal hernia. Coronary artery disease. Aortic Atherosclerosis (ICD10-I70.0). Electronically Signed   By: Rolm Baptise M.D.   On: 08/11/2019 10:33   Dg Chest Port 1 View  Result Date: 08/11/2019 CLINICAL DATA:  Syncope. EXAM: PORTABLE CHEST 1 VIEW COMPARISON:  PA and lateral chest 10/20/2018. FINDINGS: Lungs are clear. Heart size is normal. No pneumothorax or pleural fluid. Atherosclerosis. Hiatal hernia. IMPRESSION: No acute disease. Hiatal hernia. Atherosclerosis. Electronically Signed   By: Inge Rise M.D.   On: 08/11/2019 07:59   Vas [place] Lower Extremity Venous (dvt)  Result Date: 08/11/2019  Lower Venous Study Indications: Syncope with history of DVT in both legs on  10/22/18.  Anticoagulation: None. Comparison Study: DVT in both lower extremities on 10/22/18 Performing Technologist: Velva Harman Sturdivant RDMS, RVT  Examination Guidelines: A complete evaluation includes B-mode imaging, spectral Doppler, color Doppler, and power Doppler as needed of all accessible portions of each vessel. Bilateral testing is considered an integral part of a complete examination. Limited examinations for reoccurring indications may be performed as noted.  +---------+---------------+---------+-----------+----------+--------------+ RIGHT    CompressibilityPhasicitySpontaneityPropertiesThrombus Aging +---------+---------------+---------+-----------+----------+--------------+ CFV      Full           Yes      Yes                                 +---------+---------------+---------+-----------+----------+--------------+ SFJ      Full                                                        +---------+---------------+---------+-----------+----------+--------------+  FV Prox  Full                                                        +---------+---------------+---------+-----------+----------+--------------+ FV Mid   Partial                                      Chronic        +---------+---------------+---------+-----------+----------+--------------+ FV DistalPartial                                      Chronic        +---------+---------------+---------+-----------+----------+--------------+ PFV      Full                                                        +---------+---------------+---------+-----------+----------+--------------+ POP      Partial                                      Chronic        +---------+---------------+---------+-----------+----------+--------------+ PTV      Partial                                      Chronic        +---------+---------------+---------+-----------+----------+--------------+ PERO     Partial                                       Chronic        +---------+---------------+---------+-----------+----------+--------------+   +---------+---------------+---------+-----------+----------+--------------+ LEFT     CompressibilityPhasicitySpontaneityPropertiesThrombus Aging +---------+---------------+---------+-----------+----------+--------------+ CFV      Full           Yes      Yes                                 +---------+---------------+---------+-----------+----------+--------------+ SFJ      Full                                                        +---------+---------------+---------+-----------+----------+--------------+ FV Prox  Full                                                        +---------+---------------+---------+-----------+----------+--------------+ FV Mid   Full                                                        +---------+---------------+---------+-----------+----------+--------------+  FV DistalFull                                                        +---------+---------------+---------+-----------+----------+--------------+ PFV      Full                                                        +---------+---------------+---------+-----------+----------+--------------+ POP      Full           Yes      Yes                                 +---------+---------------+---------+-----------+----------+--------------+ PTV      Full                                                        +---------+---------------+---------+-----------+----------+--------------+ PERO     Full                                                        +---------+---------------+---------+-----------+----------+--------------+    Summary: Right: Findings consistent with chronic deep vein thrombosis involving the right femoral vein, right popliteal vein, right posterior tibial veins, and right peroneal veins. Findings appear improved from previous  examination. Left: There is no evidence of deep vein thrombosis in the lower extremity. Resolution of previous thrombus found on 10/22/18.  *See table(s) above for measurements and observations. Electronically signed by Deitra Mayo MD on 08/11/2019 at 6:15:26 PM.    Final     Microbiology: Recent Results (from the past 240 hour(s))  SARS CORONAVIRUS 2 (TAT 6-24 HRS) Nasopharyngeal Nasopharyngeal Swab     Status: None   Collection Time: 08/11/19  7:01 AM   Specimen: Nasopharyngeal Swab  Result Value Ref Range Status   SARS Coronavirus 2 NEGATIVE NEGATIVE Final    Comment: (NOTE) SARS-CoV-2 target nucleic acids are NOT DETECTED. The SARS-CoV-2 RNA is generally detectable in upper and lower respiratory specimens during the acute phase of infection. Negative results do not preclude SARS-CoV-2 infection, do not rule out co-infections with other pathogens, and should not be used as the sole basis for treatment or other patient management decisions. Negative results must be combined with clinical observations, patient history, and epidemiological information. The expected result is Negative. Fact Sheet for Patients: SugarRoll.be Fact Sheet for Healthcare Providers: https://www.woods-mathews.com/ This test is not yet approved or cleared by the [place] FDA and  has been authorized for detection and/or diagnosis of SARS-CoV-2 by FDA under an Emergency Use Authorization (EUA). This EUA will remain  in effect (meaning this test can be used) for the duration of the COVID-19 declaration under Section 56 4(b)(1) of the Act, 21 U.S.C. section 360bbb-3(b)(1), unless the authorization is terminated or revoked sooner. Performed at [facility]  [facility], [place] [address], [place], [place] [address]      Labs: Basic Metabolic Panel: Recent Labs  Lab 08/10/19 1900 08/11/19 0331 08/11/19 0749 08/12/19 0608  NA 142  --  143 142  K 3.8  --  3.3*  3.9  CL 104  --  106 108  CO2 26  --  27 25  GLUCOSE 234*  --  99 88  BUN 32*  --  29* 20  CREATININE 1.14*  --  0.91 0.92  CALCIUM 8.5*  --  8.2* 8.6*  MG  --  2.2  --   --    Liver Function Tests: No results for input(s): AST, ALT, ALKPHOS, BILITOT, PROT, ALBUMIN in the last 168 hours. No results for input(s): LIPASE, AMYLASE in the last 168 hours. No results for input(s): AMMONIA in the last 168 hours. CBC: Recent Labs  Lab 08/10/19 1900 08/11/19 0749 08/11/19 0833  WBC 5.9 7.3  --   HGB 11.2* 11.6*  --   HCT 36.1 37.2 33.3*  MCV 107.8* 106.9*  --   PLT 230 234  --    Cardiac Enzymes: No results for input(s): CKTOTAL, CKMB, CKMBINDEX, TROPONINI in the last 168 hours. BNP: BNP (last 3 results) Recent Labs    10/20/18 0934 08/11/19 0750  BNP 136.5* 56.7    ProBNP (last 3 results) No results for input(s): PROBNP in the last 8760 hours.  CBG: Recent Labs  Lab 08/11/19 0315 08/11/19 1133 08/12/19 0551 08/12/19 1228  GLUCAP 94 119* 91 122*       Signed:  Domenic Polite MD.  [facility] 08/13/2019, 3:18 PM

## 2019-08-19 DIAGNOSIS — I82409 Acute embolism and thrombosis of unspecified deep veins of unspecified lower extremity: Secondary | ICD-10-CM | POA: Diagnosis not present

## 2019-08-19 DIAGNOSIS — N179 Acute kidney failure, unspecified: Secondary | ICD-10-CM | POA: Diagnosis not present

## 2019-08-19 DIAGNOSIS — R001 Bradycardia, unspecified: Secondary | ICD-10-CM | POA: Diagnosis not present

## 2019-08-19 DIAGNOSIS — Z23 Encounter for immunization: Secondary | ICD-10-CM | POA: Diagnosis not present

## 2019-08-19 DIAGNOSIS — R55 Syncope and collapse: Secondary | ICD-10-CM | POA: Diagnosis not present

## 2019-08-25 DIAGNOSIS — G250 Essential tremor: Secondary | ICD-10-CM | POA: Diagnosis not present

## 2019-08-25 DIAGNOSIS — I130 Hypertensive heart and chronic kidney disease with heart failure and stage 1 through stage 4 chronic kidney disease, or unspecified chronic kidney disease: Secondary | ICD-10-CM | POA: Diagnosis not present

## 2019-08-25 DIAGNOSIS — M069 Rheumatoid arthritis, unspecified: Secondary | ICD-10-CM | POA: Diagnosis not present

## 2019-08-25 DIAGNOSIS — F329 Major depressive disorder, single episode, unspecified: Secondary | ICD-10-CM | POA: Diagnosis not present

## 2019-08-25 DIAGNOSIS — I2510 Atherosclerotic heart disease of native coronary artery without angina pectoris: Secondary | ICD-10-CM | POA: Diagnosis not present

## 2019-08-25 DIAGNOSIS — G8929 Other chronic pain: Secondary | ICD-10-CM | POA: Diagnosis not present

## 2019-08-25 DIAGNOSIS — I5030 Unspecified diastolic (congestive) heart failure: Secondary | ICD-10-CM | POA: Diagnosis not present

## 2019-08-25 DIAGNOSIS — M797 Fibromyalgia: Secondary | ICD-10-CM | POA: Diagnosis not present

## 2019-08-25 DIAGNOSIS — R55 Syncope and collapse: Secondary | ICD-10-CM | POA: Diagnosis not present

## 2019-08-25 DIAGNOSIS — R001 Bradycardia, unspecified: Secondary | ICD-10-CM | POA: Diagnosis not present

## 2019-08-25 DIAGNOSIS — N189 Chronic kidney disease, unspecified: Secondary | ICD-10-CM | POA: Diagnosis not present

## 2019-08-25 DIAGNOSIS — M47812 Spondylosis without myelopathy or radiculopathy, cervical region: Secondary | ICD-10-CM | POA: Diagnosis not present

## 2019-08-25 DIAGNOSIS — E876 Hypokalemia: Secondary | ICD-10-CM | POA: Diagnosis not present

## 2019-09-03 DIAGNOSIS — I5030 Unspecified diastolic (congestive) heart failure: Secondary | ICD-10-CM | POA: Diagnosis not present

## 2019-09-03 DIAGNOSIS — I130 Hypertensive heart and chronic kidney disease with heart failure and stage 1 through stage 4 chronic kidney disease, or unspecified chronic kidney disease: Secondary | ICD-10-CM | POA: Diagnosis not present

## 2019-09-03 DIAGNOSIS — N189 Chronic kidney disease, unspecified: Secondary | ICD-10-CM | POA: Diagnosis not present

## 2019-09-03 DIAGNOSIS — M797 Fibromyalgia: Secondary | ICD-10-CM | POA: Diagnosis not present

## 2019-09-03 DIAGNOSIS — R55 Syncope and collapse: Secondary | ICD-10-CM | POA: Diagnosis not present

## 2019-09-03 DIAGNOSIS — E876 Hypokalemia: Secondary | ICD-10-CM | POA: Diagnosis not present

## 2019-09-03 DIAGNOSIS — G250 Essential tremor: Secondary | ICD-10-CM | POA: Diagnosis not present

## 2019-09-03 DIAGNOSIS — G8929 Other chronic pain: Secondary | ICD-10-CM | POA: Diagnosis not present

## 2019-09-03 DIAGNOSIS — I2510 Atherosclerotic heart disease of native coronary artery without angina pectoris: Secondary | ICD-10-CM | POA: Diagnosis not present

## 2019-09-03 DIAGNOSIS — M069 Rheumatoid arthritis, unspecified: Secondary | ICD-10-CM | POA: Diagnosis not present

## 2019-09-03 DIAGNOSIS — R001 Bradycardia, unspecified: Secondary | ICD-10-CM | POA: Diagnosis not present

## 2019-09-03 DIAGNOSIS — F329 Major depressive disorder, single episode, unspecified: Secondary | ICD-10-CM | POA: Diagnosis not present

## 2019-09-03 DIAGNOSIS — M47812 Spondylosis without myelopathy or radiculopathy, cervical region: Secondary | ICD-10-CM | POA: Diagnosis not present

## 2019-10-28 DIAGNOSIS — M0609 Rheumatoid arthritis without rheumatoid factor, multiple sites: Secondary | ICD-10-CM | POA: Diagnosis not present

## 2019-10-28 DIAGNOSIS — Z79899 Other long term (current) drug therapy: Secondary | ICD-10-CM | POA: Diagnosis not present

## 2019-12-29 DIAGNOSIS — C44311 Basal cell carcinoma of skin of nose: Secondary | ICD-10-CM | POA: Diagnosis not present

## 2020-01-07 DIAGNOSIS — F329 Major depressive disorder, single episode, unspecified: Secondary | ICD-10-CM | POA: Diagnosis not present

## 2020-01-07 DIAGNOSIS — I82409 Acute embolism and thrombosis of unspecified deep veins of unspecified lower extremity: Secondary | ICD-10-CM | POA: Diagnosis not present

## 2020-01-07 DIAGNOSIS — Z0000 Encounter for general adult medical examination without abnormal findings: Secondary | ICD-10-CM | POA: Diagnosis not present

## 2020-01-07 DIAGNOSIS — N1831 Chronic kidney disease, stage 3a: Secondary | ICD-10-CM | POA: Diagnosis not present

## 2020-01-07 DIAGNOSIS — L089 Local infection of the skin and subcutaneous tissue, unspecified: Secondary | ICD-10-CM | POA: Diagnosis not present

## 2020-01-07 DIAGNOSIS — I5030 Unspecified diastolic (congestive) heart failure: Secondary | ICD-10-CM | POA: Diagnosis not present

## 2020-01-07 DIAGNOSIS — R7309 Other abnormal glucose: Secondary | ICD-10-CM | POA: Diagnosis not present

## 2020-01-07 DIAGNOSIS — M5090 Cervical disc disorder, unspecified, unspecified cervical region: Secondary | ICD-10-CM | POA: Diagnosis not present

## 2020-01-07 DIAGNOSIS — M797 Fibromyalgia: Secondary | ICD-10-CM | POA: Diagnosis not present

## 2020-01-07 DIAGNOSIS — K219 Gastro-esophageal reflux disease without esophagitis: Secondary | ICD-10-CM | POA: Diagnosis not present

## 2020-01-07 DIAGNOSIS — M069 Rheumatoid arthritis, unspecified: Secondary | ICD-10-CM | POA: Diagnosis not present

## 2020-01-07 DIAGNOSIS — I10 Essential (primary) hypertension: Secondary | ICD-10-CM | POA: Diagnosis not present

## 2020-01-07 DIAGNOSIS — I700 Atherosclerosis of aorta: Secondary | ICD-10-CM | POA: Diagnosis not present

## 2020-01-07 DIAGNOSIS — I2699 Other pulmonary embolism without acute cor pulmonale: Secondary | ICD-10-CM | POA: Diagnosis not present

## 2020-01-13 DIAGNOSIS — H9190 Unspecified hearing loss, unspecified ear: Secondary | ICD-10-CM | POA: Diagnosis not present

## 2020-01-13 DIAGNOSIS — M792 Neuralgia and neuritis, unspecified: Secondary | ICD-10-CM | POA: Diagnosis not present

## 2020-01-13 DIAGNOSIS — L989 Disorder of the skin and subcutaneous tissue, unspecified: Secondary | ICD-10-CM | POA: Diagnosis not present

## 2020-01-13 DIAGNOSIS — M5090 Cervical disc disorder, unspecified, unspecified cervical region: Secondary | ICD-10-CM | POA: Diagnosis not present

## 2020-02-21 ENCOUNTER — Emergency Department (HOSPITAL_COMMUNITY): Payer: PPO

## 2020-02-21 ENCOUNTER — Emergency Department (HOSPITAL_COMMUNITY)
Admission: EM | Admit: 2020-02-21 | Discharge: 2020-02-21 | Disposition: A | Discharge status: Home / Self Care | Payer: PPO | Attending: Emergency Medicine | Admitting: Emergency Medicine

## 2020-02-21 DIAGNOSIS — R404 Transient alteration of awareness: Secondary | ICD-10-CM | POA: Diagnosis not present

## 2020-02-21 DIAGNOSIS — I130 Hypertensive heart and chronic kidney disease with heart failure and stage 1 through stage 4 chronic kidney disease, or unspecified chronic kidney disease: Secondary | ICD-10-CM | POA: Diagnosis not present

## 2020-02-21 DIAGNOSIS — Z7901 Long term (current) use of anticoagulants: Secondary | ICD-10-CM | POA: Diagnosis not present

## 2020-02-21 DIAGNOSIS — R61 Generalized hyperhidrosis: Secondary | ICD-10-CM | POA: Diagnosis not present

## 2020-02-21 DIAGNOSIS — R55 Syncope and collapse: Secondary | ICD-10-CM | POA: Diagnosis not present

## 2020-02-21 DIAGNOSIS — I5032 Chronic diastolic (congestive) heart failure: Secondary | ICD-10-CM | POA: Insufficient documentation

## 2020-02-21 DIAGNOSIS — Z79899 Other long term (current) drug therapy: Secondary | ICD-10-CM | POA: Insufficient documentation

## 2020-02-21 DIAGNOSIS — R410 Disorientation, unspecified: Secondary | ICD-10-CM | POA: Diagnosis not present

## 2020-02-21 DIAGNOSIS — N189 Chronic kidney disease, unspecified: Secondary | ICD-10-CM | POA: Insufficient documentation

## 2020-02-21 DIAGNOSIS — Z96651 Presence of right artificial knee joint: Secondary | ICD-10-CM | POA: Insufficient documentation

## 2020-02-21 DIAGNOSIS — I4510 Unspecified right bundle-branch block: Secondary | ICD-10-CM | POA: Diagnosis not present

## 2020-02-21 DIAGNOSIS — R0902 Hypoxemia: Secondary | ICD-10-CM | POA: Diagnosis not present

## 2020-02-21 LAB — CBC WITH DIFFERENTIAL/PLATELET
Abs Immature Granulocytes: 0.03 10*3/uL (ref 0.00–0.07)
Basophils Absolute: 0 10*3/uL (ref 0.0–0.1)
Basophils Relative: 1 %
Eosinophils Absolute: 0.1 10*3/uL (ref 0.0–0.5)
Eosinophils Relative: 1 %
HCT: 37 % (ref 36.0–46.0)
Hemoglobin: 10.9 g/dL — ABNORMAL LOW (ref 12.0–15.0)
Immature Granulocytes: 0 %
Lymphocytes Relative: 24 %
Lymphs Abs: 1.7 10*3/uL (ref 0.7–4.0)
MCH: 33.7 pg (ref 26.0–34.0)
MCHC: 29.5 g/dL — ABNORMAL LOW (ref 30.0–36.0)
MCV: 114.6 fL — ABNORMAL HIGH (ref 80.0–100.0)
Monocytes Absolute: 0.5 10*3/uL (ref 0.1–1.0)
Monocytes Relative: 7 %
Neutro Abs: 4.7 10*3/uL (ref 1.7–7.7)
Neutrophils Relative %: 67 %
Platelets: 180 10*3/uL (ref 150–400)
RBC: 3.23 MIL/uL — ABNORMAL LOW (ref 3.87–5.11)
RDW: 15.2 % (ref 11.5–15.5)
WBC: 6.9 10*3/uL (ref 4.0–10.5)
nRBC: 0 % (ref 0.0–0.2)

## 2020-02-21 LAB — BASIC METABOLIC PANEL
Anion gap: 10 (ref 5–15)
BUN: 24 mg/dL — ABNORMAL HIGH (ref 8–23)
CO2: 26 mmol/L (ref 22–32)
Calcium: 8.6 mg/dL — ABNORMAL LOW (ref 8.9–10.3)
Chloride: 108 mmol/L (ref 98–111)
Creatinine, Ser: 1.09 mg/dL — ABNORMAL HIGH (ref 0.44–1.00)
GFR calc Af Amer: 52 mL/min — ABNORMAL LOW (ref >60)
GFR calc non Af Amer: 45 mL/min — ABNORMAL LOW (ref >60)
Glucose, Bld: 123 mg/dL — ABNORMAL HIGH (ref 70–99)
Potassium: 4.3 mmol/L (ref 3.5–5.1)
Sodium: 144 mmol/L (ref 135–145)

## 2020-02-21 LAB — URINALYSIS, ROUTINE W REFLEX MICROSCOPIC
Bilirubin Urine: NEGATIVE
Glucose, UA: NEGATIVE mg/dL
Hgb urine dipstick: NEGATIVE
Ketones, ur: NEGATIVE mg/dL
Leukocytes,Ua: NEGATIVE
Nitrite: POSITIVE — AB
Protein, ur: NEGATIVE mg/dL
Specific Gravity, Urine: 1.015 (ref 1.005–1.030)
pH: 5 (ref 5.0–8.0)

## 2020-02-21 LAB — PROTIME-INR
INR: 1.1 (ref 0.8–1.2)
Prothrombin Time: 13.8 seconds (ref 11.4–15.2)

## 2020-02-21 LAB — CBG MONITORING, ED: Glucose-Capillary: 124 mg/dL — ABNORMAL HIGH (ref 70–99)

## 2020-02-21 MED ORDER — SODIUM CHLORIDE 0.9 % IV BOLUS
500.0000 mL | Freq: Once | INTRAVENOUS | Status: AC
  Administered 2020-02-21: 500 mL via INTRAVENOUS

## 2020-02-21 NOTE — ED Notes (Signed)
Called [facility] for transport.  

## 2020-02-21 NOTE — ED Notes (Signed)
CBG results of 124 reported to [place], Therapist, sports.

## 2020-02-21 NOTE — ED Notes (Signed)
Pt provided water and orange juice, tolerated well. EDP aware

## 2020-02-21 NOTE — ED Provider Notes (Signed)
[facility] EMERGENCY DEPARTMENT Provider Note   CSN: [id number] Arrival date & time: 02/21/20  1256     History Chief Complaint  Patient presents with  . Loss of Consciousness    Dominique Mckee is a 86 y.o. female.  She lives at home with her son.  She was sitting on the commode having a bowel movement when she became unresponsive.  Family thinks there was probably 30 seconds loss of consciousness.  She did not fall, did not hit head.  EMS found her with no radial pulses but when they laid her down her initial blood pressure was in the 70s palpation.  They gave her IV fluids in the last pressure was 96/46.  She has a history of syncope.  There was no blood in the bowel movement.  Patient currently denies any complaints.  At baseline patient is wheelchair-bound and needs help with transfers.  It sounds like this is due to some prior back surgery.  The history is provided by the patient and the EMS personnel.  Loss of Consciousness Episode history:  Single Most recent episode:  Today Duration:  30 seconds Progression:  Improving Chronicity:  Recurrent Context: bowel movement   Witnessed: yes   Relieved by:  Lying down Worsened by:  Nothing Ineffective treatments:  None tried Associated symptoms: diaphoresis   Associated symptoms: no chest pain, no difficulty breathing, no fever, no focal weakness, no nausea, no recent fall, no rectal bleeding, no shortness of breath and no vomiting        Past Medical History:  Diagnosis Date  . Arthritis   . Benign essential tremor   . Cervical spondylosis   . Cervicogenic headache   . CHF (congestive heart failure) ([place])   . Chronic renal insufficiency   . Degenerative disc disease   . Depression   . Fibromyalgia   . Gait disorder   . GERD (gastroesophageal reflux disease)   . Heart murmur   . HOH (hard of hearing)   . Hypertension   . Lumbar spondylosis   . Migraine headache   . Mild obesity   . Osteopenia   .  Rheumatoid arthritis [facility])     Patient Active Problem List   Diagnosis Date Noted  . Syncope and collapse 08/11/2019  . History of pulmonary embolus (PE) 08/11/2019  . Acute pulmonary embolism ([place]) 11/15/2018  . Acute deep vein thrombosis (DVT) of femoral vein of right lower extremity ([facility]) 10/29/2018  . Acute deep vein thrombosis (DVT) of tibial vein of both lower extremities ([place]) 10/29/2018  . Paroxysmal atrial fibrillation ([place]) 10/29/2018  . Acute hypokalemia 10/29/2018  . Hypomagnesemia 10/29/2018  . Polyneuropathy 10/29/2018  . Weakness generalized   . Palliative care by specialist   . DNR (do not resuscitate)   . Neck pain   . Hypoxia 10/21/2018  . Nausea and vomiting 10/21/2018  . Chronic diastolic CHF (congestive heart failure) ([place]) 10/21/2018  . Status post lumbar spinal fusion 04/13/2017  . Degenerative lumbar spinal stenosis 03/15/2017  . CKD (chronic kidney disease) 01/11/2017  . Anemia 01/10/2017  . Chronic pain 01/10/2017  . Heart murmur 01/10/2017  . Acute diastolic congestive heart failure ([place]) 07/08/2015  . Acute respiratory failure with hypoxia ([place]) 07/08/2015  . Shoulder dislocation, recurrent 07/06/2015  . HTN (hypertension) 07/06/2015  . Rheumatoid arthritis ([place]) 07/06/2015  . GERD (gastroesophageal reflux disease) 07/06/2015  . Shoulder dislocation   . Concussion with no loss of consciousness 12/30/2013  . Cervical spondylosis  without myelopathy 01/01/2013  . Migraine without aura 01/01/2013  . Abnormality of gait 01/01/2013  . Essential and other specified forms of tremor 01/01/2013    Past Surgical History:  Procedure Laterality Date  . ABDOMINAL HYSTERECTOMY    . APPENDECTOMY    . CHOLECYSTECTOMY    . COLONOSCOPY    . ELBOW SURGERY Left   . HAMMER TOE SURGERY Left   . LUMBAR FUSION  02/2017  . SHOULDER CLOSED REDUCTION Right 07/08/2015   Procedure: RIGHT SHOULDER CLOSED REDUCTION;  Surgeon: Meredith Pel, MD;  Location: [facility];   Service: Orthopedics;  Laterality: Right;  . SHOULDER CLOSED REDUCTION Left 08/23/2017   Procedure: CLOSED REDUCTION LEFT SHOULDER;  Surgeon: Meredith Pel, MD;  Location: [facility];  Service: Orthopedics;  Laterality: Left;  . TONSILLECTOMY    . TOTAL KNEE ARTHROPLASTY Right    right     OB History    Gravida      Para      Term      Preterm      AB      Living  1     SAB      TAB      Ectopic      Multiple      Live Births              Family History  Problem Relation Age of Onset  . Osteoporosis Mother   . Intracerebral hemorrhage Mother 54  . AAA (abdominal aortic aneurysm) Father 3  . Lung cancer Brother   . Diabetes Brother   . Lung cancer Brother   . Diabetes Sister   . Lung cancer Sister   . Bone cancer Sister   . Kidney failure Son        left kidney transplant  . Diabetes type I Daughter   . Anesthesia problems Neg Hx   . Hypertension Neg Hx     Social History   Tobacco Use  . Smoking status: Never Smoker  . Smokeless tobacco: Never Used  Substance Use Topics  . Alcohol use: No  . Drug use: No    Home Medications Prior to Admission medications   Medication Sig Start Date End Date Taking? Authorizing Provider  acetaminophen (TYLENOL) 500 MG tablet Take 500 mg by mouth every 6 (six) hours as needed for mild pain.    [provider]  apixaban (ELIQUIS) 5 MG TABS tablet Take 1 tablet (5 mg total) by mouth 2 (two) times daily. 08/12/19   Domenic Polite, MD  Calcium Carb-Cholecalciferol (CALCIUM 600+D3) 600-200 MG-UNIT TABS Take 1 tablet by mouth daily. 11/15/18   Hennie Duos, MD  calcium carbonate (TUMS - DOSED IN MG ELEMENTAL CALCIUM) 500 MG chewable tablet Chew 1 tablet by mouth 2 (two) times daily as needed for indigestion.     [provider]  diclofenac sodium (VOLTAREN) 1 % GEL Apply 4 g topically 2 (two) times daily. To each shoulder 11/15/18   Hennie Duos, MD  DULoxetine (CYMBALTA) 30 MG capsule Take  1 capsule (30 mg total) by mouth daily. 11/15/18   Hennie Duos, MD  ferrous sulfate 325 (65 FE) MG tablet Take 1 tablet (325 mg total) by mouth every evening. 11/15/18   Hennie Duos, MD  folic acid (FOLVITE) 1 MG tablet Take 1 tablet (1 mg total) by mouth daily. 11/15/18   Hennie Duos, MD  gabapentin (NEURONTIN) 300 MG capsule Take 3 capsules (900 mg  total) by mouth at bedtime. 11/15/18   Hennie Duos, MD  methotrexate (RHEUMATREX) 2.5 MG tablet Take 10 tablets (25 mg total) by mouth once a week. Caution:Chemotherapy. Protect from light. THURSDAYS 11/15/18   Hennie Duos, MD  pantoprazole (PROTONIX) 40 MG tablet Take 1 tablet (40 mg total) by mouth daily. 11/15/18   Hennie Duos, MD  propranolol (INDERAL) 20 MG tablet Take 0.5 tablets (10 mg total) by mouth 2 (two) times daily. 08/12/19   Domenic Polite, MD    Allergies    Feldene [piroxicam], Clinoril [sulindac], and Morphine and related  Review of Systems   Review of Systems  Constitutional: Positive for diaphoresis. Negative for fever.  HENT: Negative for sore throat.   Eyes: Negative for visual disturbance.  Respiratory: Negative for shortness of breath.   Cardiovascular: Positive for syncope. Negative for chest pain.  Gastrointestinal: Negative for abdominal pain, nausea and vomiting.  Genitourinary: Negative for dysuria.  Musculoskeletal: Negative for neck pain.  Skin: Negative for rash.  Neurological: Positive for syncope and light-headedness. Negative for focal weakness.    Physical Exam Updated Vital Signs BP (!) 112/55   Pulse 61   Temp 97.7 F (36.5 C) (Oral)   Resp 13   Ht 5\' 3"  (1.6 m)   Wt 68 kg   SpO2 97%   BMI 26.57 kg/m   Physical Exam Vitals and nursing note reviewed.  Constitutional:      General: She is not in acute distress.    Appearance: She is well-developed.  HENT:     Head: Normocephalic and atraumatic.  Eyes:     Conjunctiva/sclera: Conjunctivae normal.    Cardiovascular:     Rate and Rhythm: Normal rate and regular rhythm.     Heart sounds: No murmur.  Pulmonary:     Effort: Pulmonary effort is normal. No respiratory distress.     Breath sounds: Normal breath sounds.  Abdominal:     Palpations: Abdomen is soft.     Tenderness: There is no abdominal tenderness.  Musculoskeletal:        General: No deformity or signs of injury. Normal range of motion.     Cervical back: Neck supple.  Skin:    General: Skin is warm and dry.     Capillary Refill: Capillary refill takes less than 2 seconds.  Neurological:     General: No focal deficit present.     Mental Status: She is alert. Mental status is at baseline.     ED Results / Procedures / Treatments   Labs (all labs ordered are listed, but only abnormal results are displayed) Labs Reviewed  BASIC METABOLIC PANEL - Abnormal; Notable for the following components:      Result Value   Glucose, Bld 123 (*)    BUN 24 (*)    Creatinine, Ser 1.09 (*)    Calcium 8.6 (*)    GFR calc non Af Amer 45 (*)    GFR calc Af Amer 52 (*)    All other components within normal limits  CBC WITH DIFFERENTIAL/PLATELET - Abnormal; Notable for the following components:   RBC 3.23 (*)    Hemoglobin 10.9 (*)    MCV 114.6 (*)    MCHC 29.5 (*)    All other components within normal limits  URINALYSIS, ROUTINE W REFLEX MICROSCOPIC - Abnormal; Notable for the following components:   APPearance HAZY (*)    Nitrite POSITIVE (*)    Bacteria, UA MANY (*)  All other components within normal limits  CBG MONITORING, ED - Abnormal; Notable for the following components:   Glucose-Capillary 124 (*)    All other components within normal limits  URINE CULTURE  PROTIME-INR    EKG EKG Interpretation  Date/Time:  Sunday February 21 2020 13:07:32 EDT Ventricular Rate:  61 PR Interval:    QRS Duration: 163 QT Interval:  462 QTC Calculation: 466 R Axis:   95 Text Interpretation: Sinus or ectopic atrial rhythm Right  bundle branch block No significant change since 12/19 Confirmed by Aletta Edouard [phone]) on 02/21/2020 1:09:19 PM   Radiology DG Chest Port 1 View  Result Date: 02/21/2020 CLINICAL DATA:  Syncope. EXAM: PORTABLE CHEST 1 VIEW COMPARISON:  Chest x-ray 08/11/2019, chest CT 08/11/2019 FINDINGS: Image rotated to the RIGHT. Cardiomediastinal contours are stable accounting for degree of rotation. Increased retrocardiac opacity similar to the prior study compatible with hiatal hernia. No dense consolidation or pleural effusion. Stable basilar scarring. Visualized skeletal structures are unremarkable. IMPRESSION: 1. Stable chest without acute cardiopulmonary disease. 2. Hiatal hernia. Electronically Signed   By: Zetta Bills M.D.   On: 02/21/2020 15:05    Procedures Procedures (including critical care time)  Medications Ordered in ED Medications  sodium chloride 0.9 % bolus 500 mL (0 mLs Intravenous Stopped 02/21/20 1500)    ED Course  I have reviewed the triage vital signs and the nursing notes.  Pertinent labs & imaging results that were available during my care of the patient were reviewed by me and considered in my medical decision making (see chart for details).  Clinical Course as of Feb 21 2024  Sun Feb 21, 2020  1435 Patient's chest x-ray shows some rotation, no gross infiltrates or pneumothorax.  Awaiting radiology reading.   [MB]  1444 Patient's blood pressures are remain good here.  She has received some IV fluids.  She denies any plaints currently.  She was admitted last fall for similar episode and had a CT of her chest along with a cardiac echo.  She was restarted on her Eliquis at that time for chronic DVT of her lower extremities.   [MB]  32 Reached out to the patient's son, no answer at number in chart.   [MB]  [id number] Discussed with patient's son who lives with her.  We reviewed her lab work and the fact that her blood pressures remained stable here.  He is asking that she be  returned home by ambulance as it is difficult to transport in a car.  Will call P. Marena Chancy.  Return instructions discussed with him.   [MB]  2023 Protein: NEGATIVE [MB]    Clinical Course User Index [MB] Hayden Rasmussen, MD   MDM Rules/Calculators/A&P                     This patient complains of syncope; this involves an extensive number of treatment Options and is a complaint that carries with it a high risk of complications and Morbidity. The differential includes vasovagal, hypovolemia, anemia, arrhythmia, metabolic derangement, less likely PE, ACS  I ordered, reviewed and interpreted labs, which included CBC with normal white count and stable hemoglobin.  Chemistry with slightly elevated creatinine but at baseline.  Urinalysis showing many bacteria nitrite positive but 0-5 whites not having any urinary symptoms.  Will send for culture I ordered medication IV fluids with improvement in her blood pressure I ordered imaging studies which included chest x-ray and I independently    visualized  and interpreted imaging which showed no gross infiltrates Additional history obtained from patient's son who I called at home Previous records obtained and reviewed prior admission back in the fall with similar presentation  After the interventions stated above, I reevaluated the patient and found patient to remain asymptomatic since arrival.  She is tolerating fluids and feels back to baseline.  I think mostly likely she had a vagal episode while straining to have a bowel movement.  No evidence of bleeding.  During her last admission she had a CT PE along with a cardiac echo that did not show any acute findings.  I do not see any evidence that those need to be repeated right now.  Final Clinical Impression(s) / ED Diagnoses Final diagnoses:  Syncope and collapse    Rx / DC Orders ED Discharge Orders    None       Hayden Rasmussen, MD 02/21/20 2025

## 2020-02-21 NOTE — Discharge Instructions (Addendum)
You were seen in the emergency department for evaluation of fainting while moving her bowels.  You had blood work EKG chest x-ray that did not show any serious findings.  Your blood pressure improved with some IV fluids.  Please contact your primary care doctor for follow-up.  Return to the emergency department if any worsening or concerning symptoms.

## 2020-02-21 NOTE — ED Triage Notes (Addendum)
Pt to ED via EMS from home, witnessed pt had vagal down syncopal epsidoe on the toilet. Per family pt LOC aprox 30 seconds. On EMS arrival bp 70 palp.  Pt did not fall, did not hit head. #22 right wrist, 666ml NS Bolus, Last VS: 96/46 , 62 PULSE, CBG 232 mg/dl, 99% RA . Orientation at baseline.  hx AFIB,  Pt comes with DNR paper.

## 2020-02-21 NOTE — ED Notes (Signed)
Pt d/c home per MD order. Discharge summary reviewed with pt, pt verbalizes understanding. PTAR on unit to transport pt home. Off unit via stretcher. No s/s of acute distress noted.

## 2020-02-23 LAB — URINE CULTURE: Culture: >=100000 — AB

## 2020-02-24 ENCOUNTER — Telehealth: Payer: Self-pay

## 2020-02-24 NOTE — Telephone Encounter (Signed)
No treatment for UC ED 02/21/20 per Alecia Lemming PA

## 2020-02-25 DIAGNOSIS — R55 Syncope and collapse: Secondary | ICD-10-CM | POA: Diagnosis not present

## 2020-02-25 DIAGNOSIS — G500 Trigeminal neuralgia: Secondary | ICD-10-CM | POA: Diagnosis not present

## 2020-02-25 DIAGNOSIS — M14671 Charcot's joint, right ankle and foot: Secondary | ICD-10-CM | POA: Diagnosis not present

## 2020-02-25 DIAGNOSIS — M545 Low back pain: Secondary | ICD-10-CM | POA: Diagnosis not present

## 2020-02-25 DIAGNOSIS — M8088XD Other osteoporosis with current pathological fracture, vertebra(e), subsequent encounter for fracture with routine healing: Secondary | ICD-10-CM | POA: Diagnosis not present

## 2020-02-25 DIAGNOSIS — M0609 Rheumatoid arthritis without rheumatoid factor, multiple sites: Secondary | ICD-10-CM | POA: Diagnosis not present

## 2020-02-25 DIAGNOSIS — Z79899 Other long term (current) drug therapy: Secondary | ICD-10-CM | POA: Diagnosis not present

## 2020-02-25 DIAGNOSIS — M2550 Pain in unspecified joint: Secondary | ICD-10-CM | POA: Diagnosis not present

## 2020-02-25 DIAGNOSIS — M150 Primary generalized (osteo)arthritis: Secondary | ICD-10-CM | POA: Diagnosis not present

## 2020-02-29 DIAGNOSIS — H0102A Squamous blepharitis right eye, upper and lower eyelids: Secondary | ICD-10-CM | POA: Diagnosis not present

## 2020-02-29 DIAGNOSIS — H401134 Primary open-angle glaucoma, bilateral, indeterminate stage: Secondary | ICD-10-CM | POA: Diagnosis not present

## 2020-02-29 DIAGNOSIS — H04123 Dry eye syndrome of bilateral lacrimal glands: Secondary | ICD-10-CM | POA: Diagnosis not present

## 2020-02-29 DIAGNOSIS — H25813 Combined forms of age-related cataract, bilateral: Secondary | ICD-10-CM | POA: Diagnosis not present

## 2020-03-01 DIAGNOSIS — I959 Hypotension, unspecified: Secondary | ICD-10-CM | POA: Diagnosis not present

## 2020-03-01 DIAGNOSIS — R55 Syncope and collapse: Secondary | ICD-10-CM | POA: Diagnosis not present

## 2020-04-06 DIAGNOSIS — D649 Anemia, unspecified: Secondary | ICD-10-CM | POA: Diagnosis not present

## 2020-04-11 DIAGNOSIS — H401122 Primary open-angle glaucoma, left eye, moderate stage: Secondary | ICD-10-CM | POA: Diagnosis not present

## 2020-04-11 DIAGNOSIS — H0102B Squamous blepharitis left eye, upper and lower eyelids: Secondary | ICD-10-CM | POA: Diagnosis not present

## 2020-04-11 DIAGNOSIS — H25813 Combined forms of age-related cataract, bilateral: Secondary | ICD-10-CM | POA: Diagnosis not present

## 2020-04-11 DIAGNOSIS — H0102A Squamous blepharitis right eye, upper and lower eyelids: Secondary | ICD-10-CM | POA: Diagnosis not present

## 2020-04-11 DIAGNOSIS — H04123 Dry eye syndrome of bilateral lacrimal glands: Secondary | ICD-10-CM | POA: Diagnosis not present

## 2020-04-11 DIAGNOSIS — H401111 Primary open-angle glaucoma, right eye, mild stage: Secondary | ICD-10-CM | POA: Diagnosis not present

## 2020-05-10 DIAGNOSIS — R7303 Prediabetes: Secondary | ICD-10-CM | POA: Diagnosis not present

## 2020-05-10 DIAGNOSIS — I2699 Other pulmonary embolism without acute cor pulmonale: Secondary | ICD-10-CM | POA: Diagnosis not present

## 2020-05-10 DIAGNOSIS — I5030 Unspecified diastolic (congestive) heart failure: Secondary | ICD-10-CM | POA: Diagnosis not present

## 2020-05-10 DIAGNOSIS — I10 Essential (primary) hypertension: Secondary | ICD-10-CM | POA: Diagnosis not present

## 2020-05-10 DIAGNOSIS — I700 Atherosclerosis of aorta: Secondary | ICD-10-CM | POA: Diagnosis not present

## 2020-05-10 DIAGNOSIS — L570 Actinic keratosis: Secondary | ICD-10-CM | POA: Diagnosis not present

## 2020-05-10 DIAGNOSIS — R269 Unspecified abnormalities of gait and mobility: Secondary | ICD-10-CM | POA: Diagnosis not present

## 2020-05-10 DIAGNOSIS — N1831 Chronic kidney disease, stage 3a: Secondary | ICD-10-CM | POA: Diagnosis not present

## 2020-05-10 DIAGNOSIS — M069 Rheumatoid arthritis, unspecified: Secondary | ICD-10-CM | POA: Diagnosis not present

## 2020-05-10 DIAGNOSIS — G250 Essential tremor: Secondary | ICD-10-CM | POA: Diagnosis not present

## 2020-05-10 DIAGNOSIS — K219 Gastro-esophageal reflux disease without esophagitis: Secondary | ICD-10-CM | POA: Diagnosis not present

## 2020-05-19 DIAGNOSIS — R531 Weakness: Secondary | ICD-10-CM | POA: Diagnosis not present

## 2020-05-19 DIAGNOSIS — M179 Osteoarthritis of knee, unspecified: Secondary | ICD-10-CM | POA: Diagnosis not present

## 2020-05-19 DIAGNOSIS — M25562 Pain in left knee: Secondary | ICD-10-CM | POA: Diagnosis not present

## 2020-05-19 DIAGNOSIS — R197 Diarrhea, unspecified: Secondary | ICD-10-CM | POA: Diagnosis not present

## 2020-05-26 ENCOUNTER — Encounter: Payer: Self-pay | Admitting: Family Medicine

## 2020-05-26 ENCOUNTER — Ambulatory Visit (INDEPENDENT_AMBULATORY_CARE_PROVIDER_SITE_OTHER): Payer: PPO

## 2020-05-26 ENCOUNTER — Ambulatory Visit (INDEPENDENT_AMBULATORY_CARE_PROVIDER_SITE_OTHER): Payer: PPO | Admitting: Family Medicine

## 2020-05-26 ENCOUNTER — Other Ambulatory Visit: Payer: Self-pay

## 2020-05-26 DIAGNOSIS — M25562 Pain in left knee: Secondary | ICD-10-CM

## 2020-05-26 NOTE — Progress Notes (Signed)
Office Visit Note   Patient: Dominique Mckee           Date of Birth: 1930/09/28           MRN: [id number] Visit Date: 05/26/2020 Requested by: Wenda Low, MD [place] [employer] [address],  [place] [address] PCP: Wenda Low, MD  Subjective: Chief Complaint  Patient presents with  . Left Knee - Pain    DOI 05/12/20 pt "passed out" - unsure if she fell to the floor, against her wheelchair, or what. Was being transferred by CNA from toilet to bed. Pain mainly lateral knee, but also has pain posterior knee. Hurts to bear weight. Feels better lying flat.     HPI: She is here with left knee pain.  2 weeks ago at home, she was on her bedside commode and passed out.  Her caretaker attempted to lift her but had a great deal of difficulty, it caused a lot of bruising and pain in both of her lower extremities.  She has improved overall, but she still cannot bear full weight on her left knee.  It hurts on the lateral aspect.              ROS:   All other systems were reviewed and are negative.  Objective: Vital Signs: There were no vitals taken for this visit.  Physical Exam:  General:  Alert and oriented, in no acute distress. Pulm:  Breathing unlabored. Psy:  Normal mood, congruent affect. Skin: There is resolving ecchymosis over the anterior lateral aspect of the left knee and in various areas on both lower extremities. Left knee: She has a flexion contracture of about 5 degrees.  It is painful to fully extend her knee.  She has trace effusion, no tenderness over the patella.  Maximum tenderness over the lateral tibial plateau and the lateral joint line.  Imaging: XR Knee 1-2 Views Left  Result Date: 05/26/2020 X-rays of the left knee reveal moderate DJD.  I do not think she has a fracture, although when I look at x-rays from 2019, there may be a cortical defect in the central aspect of the tibia plateau indicating a fracture.   Assessment & Plan: 1.  2-week status post fall  with persistent left knee pain, question lateral tibial plateau fracture. -Minimize weightbearing, return in 2 weeks for recheck.  Repeat x-rays if still having pain.     Procedures: No procedures performed  No notes on file     PMFS History: Patient Active Problem List   Diagnosis Date Noted  . Syncope and collapse 08/11/2019  . History of pulmonary embolus (PE) 08/11/2019  . Acute pulmonary embolism ([place]) 11/15/2018  . Acute deep vein thrombosis (DVT) of femoral vein of right lower extremity ([place]) 10/29/2018  . Acute deep vein thrombosis (DVT) of tibial vein of both lower extremities ([place]) 10/29/2018  . Paroxysmal atrial fibrillation ([place]) 10/29/2018  . Acute hypokalemia 10/29/2018  . Hypomagnesemia 10/29/2018  . Polyneuropathy 10/29/2018  . Weakness generalized   . Palliative care by specialist   . DNR (do not resuscitate)   . Neck pain   . Hypoxia 10/21/2018  . Nausea and vomiting 10/21/2018  . Chronic diastolic CHF (congestive heart failure) ([place]) 10/21/2018  . Status post lumbar spinal fusion 04/13/2017  . Degenerative lumbar spinal stenosis 03/15/2017  . CKD (chronic kidney disease) 01/11/2017  . Anemia 01/10/2017  . Chronic pain 01/10/2017  . Heart murmur 01/10/2017  . Acute diastolic congestive heart failure (  [place]) 07/08/2015  . Acute respiratory failure with hypoxia ([place]) 07/08/2015  . Shoulder dislocation, recurrent 07/06/2015  . HTN (hypertension) 07/06/2015  . Rheumatoid arthritis ([place]) 07/06/2015  . GERD (gastroesophageal reflux disease) 07/06/2015  . Shoulder dislocation   . Concussion with no loss of consciousness 12/30/2013  . Cervical spondylosis without myelopathy 01/01/2013  . Migraine without aura 01/01/2013  . Abnormality of gait 01/01/2013  . Essential and other specified forms of tremor 01/01/2013   Past Medical History:  Diagnosis Date  . Arthritis   . Benign essential tremor   . Cervical spondylosis   . Cervicogenic headache   . CHF  (congestive heart failure) ([place])   . Chronic renal insufficiency   . Degenerative disc disease   . Depression   . Fibromyalgia   . Gait disorder   . GERD (gastroesophageal reflux disease)   . Heart murmur   . HOH (hard of hearing)   . Hypertension   . Lumbar spondylosis   . Migraine headache   . Mild obesity   . Osteopenia   . Rheumatoid arthritis ([place])     Family History  Problem Relation Age of Onset  . Osteoporosis Mother   . Intracerebral hemorrhage Mother 47  . AAA (abdominal aortic aneurysm) Father 45  . Lung cancer Brother   . Diabetes Brother   . Lung cancer Brother   . Diabetes Sister   . Lung cancer Sister   . Bone cancer Sister   . Kidney failure Son        left kidney transplant  . Diabetes type I Daughter   . Anesthesia problems Neg Hx   . Hypertension Neg Hx     Past Surgical History:  Procedure Laterality Date  . ABDOMINAL HYSTERECTOMY    . APPENDECTOMY    . CHOLECYSTECTOMY    . COLONOSCOPY    . ELBOW SURGERY Left   . HAMMER TOE SURGERY Left   . LUMBAR FUSION  02/2017  . SHOULDER CLOSED REDUCTION Right 07/08/2015   Procedure: RIGHT SHOULDER CLOSED REDUCTION;  Surgeon: Meredith Pel, MD;  Location: [facility];  Service: Orthopedics;  Laterality: Right;  . SHOULDER CLOSED REDUCTION Left 08/23/2017   Procedure: CLOSED REDUCTION LEFT SHOULDER;  Surgeon: Meredith Pel, MD;  Location: [facility];  Service: Orthopedics;  Laterality: Left;  . TONSILLECTOMY    . TOTAL KNEE ARTHROPLASTY Right    right   Social History   Occupational History  . Occupation: Retired    Fish farm manager: RETIRED  Tobacco Use  . Smoking status: Never Smoker  . Smokeless tobacco: Never Used  Vaping Use  . Vaping Use: Never used  Substance and Sexual Activity  . Alcohol use: No  . Drug use: No  . Sexual activity: Not Currently

## 2020-06-09 ENCOUNTER — Ambulatory Visit: Payer: PPO | Admitting: Family Medicine

## 2020-06-14 ENCOUNTER — Ambulatory Visit: Payer: PPO | Admitting: Family Medicine

## 2020-06-23 DIAGNOSIS — Z7401 Bed confinement status: Secondary | ICD-10-CM | POA: Diagnosis not present

## 2020-06-23 DIAGNOSIS — M069 Rheumatoid arthritis, unspecified: Secondary | ICD-10-CM | POA: Diagnosis not present

## 2020-06-25 DIAGNOSIS — K219 Gastro-esophageal reflux disease without esophagitis: Secondary | ICD-10-CM | POA: Diagnosis not present

## 2020-06-25 DIAGNOSIS — M069 Rheumatoid arthritis, unspecified: Secondary | ICD-10-CM | POA: Diagnosis not present

## 2020-06-25 DIAGNOSIS — M810 Age-related osteoporosis without current pathological fracture: Secondary | ICD-10-CM | POA: Diagnosis not present

## 2020-06-25 DIAGNOSIS — G250 Essential tremor: Secondary | ICD-10-CM | POA: Diagnosis not present

## 2020-06-25 DIAGNOSIS — F329 Major depressive disorder, single episode, unspecified: Secondary | ICD-10-CM | POA: Diagnosis not present

## 2020-06-25 DIAGNOSIS — R7303 Prediabetes: Secondary | ICD-10-CM | POA: Diagnosis not present

## 2020-06-25 DIAGNOSIS — I051 Rheumatic mitral insufficiency: Secondary | ICD-10-CM | POA: Diagnosis not present

## 2020-06-25 DIAGNOSIS — R42 Dizziness and giddiness: Secondary | ICD-10-CM | POA: Diagnosis not present

## 2020-06-25 DIAGNOSIS — I5030 Unspecified diastolic (congestive) heart failure: Secondary | ICD-10-CM | POA: Diagnosis not present

## 2020-06-25 DIAGNOSIS — I130 Hypertensive heart and chronic kidney disease with heart failure and stage 1 through stage 4 chronic kidney disease, or unspecified chronic kidney disease: Secondary | ICD-10-CM | POA: Diagnosis not present

## 2020-06-25 DIAGNOSIS — N1831 Chronic kidney disease, stage 3a: Secondary | ICD-10-CM | POA: Diagnosis not present

## 2020-06-25 DIAGNOSIS — M1712 Unilateral primary osteoarthritis, left knee: Secondary | ICD-10-CM | POA: Diagnosis not present

## 2020-06-25 DIAGNOSIS — M797 Fibromyalgia: Secondary | ICD-10-CM | POA: Diagnosis not present

## 2020-06-29 DIAGNOSIS — I130 Hypertensive heart and chronic kidney disease with heart failure and stage 1 through stage 4 chronic kidney disease, or unspecified chronic kidney disease: Secondary | ICD-10-CM | POA: Diagnosis not present

## 2020-06-29 DIAGNOSIS — K219 Gastro-esophageal reflux disease without esophagitis: Secondary | ICD-10-CM | POA: Diagnosis not present

## 2020-06-29 DIAGNOSIS — N1831 Chronic kidney disease, stage 3a: Secondary | ICD-10-CM | POA: Diagnosis not present

## 2020-06-29 DIAGNOSIS — G250 Essential tremor: Secondary | ICD-10-CM | POA: Diagnosis not present

## 2020-06-29 DIAGNOSIS — R42 Dizziness and giddiness: Secondary | ICD-10-CM | POA: Diagnosis not present

## 2020-06-29 DIAGNOSIS — M1712 Unilateral primary osteoarthritis, left knee: Secondary | ICD-10-CM | POA: Diagnosis not present

## 2020-06-29 DIAGNOSIS — F329 Major depressive disorder, single episode, unspecified: Secondary | ICD-10-CM | POA: Diagnosis not present

## 2020-06-29 DIAGNOSIS — M797 Fibromyalgia: Secondary | ICD-10-CM | POA: Diagnosis not present

## 2020-06-29 DIAGNOSIS — I051 Rheumatic mitral insufficiency: Secondary | ICD-10-CM | POA: Diagnosis not present

## 2020-06-29 DIAGNOSIS — I5030 Unspecified diastolic (congestive) heart failure: Secondary | ICD-10-CM | POA: Diagnosis not present

## 2020-06-29 DIAGNOSIS — M810 Age-related osteoporosis without current pathological fracture: Secondary | ICD-10-CM | POA: Diagnosis not present

## 2020-06-29 DIAGNOSIS — R7303 Prediabetes: Secondary | ICD-10-CM | POA: Diagnosis not present

## 2020-06-29 DIAGNOSIS — M069 Rheumatoid arthritis, unspecified: Secondary | ICD-10-CM | POA: Diagnosis not present

## 2020-07-29 DIAGNOSIS — M069 Rheumatoid arthritis, unspecified: Secondary | ICD-10-CM | POA: Diagnosis not present

## 2020-07-29 DIAGNOSIS — F329 Major depressive disorder, single episode, unspecified: Secondary | ICD-10-CM | POA: Diagnosis not present

## 2020-07-29 DIAGNOSIS — I5030 Unspecified diastolic (congestive) heart failure: Secondary | ICD-10-CM | POA: Diagnosis not present

## 2020-07-29 DIAGNOSIS — K219 Gastro-esophageal reflux disease without esophagitis: Secondary | ICD-10-CM | POA: Diagnosis not present

## 2020-07-29 DIAGNOSIS — R7303 Prediabetes: Secondary | ICD-10-CM | POA: Diagnosis not present

## 2020-07-29 DIAGNOSIS — I051 Rheumatic mitral insufficiency: Secondary | ICD-10-CM | POA: Diagnosis not present

## 2020-07-29 DIAGNOSIS — R42 Dizziness and giddiness: Secondary | ICD-10-CM | POA: Diagnosis not present

## 2020-07-29 DIAGNOSIS — M810 Age-related osteoporosis without current pathological fracture: Secondary | ICD-10-CM | POA: Diagnosis not present

## 2020-07-29 DIAGNOSIS — I130 Hypertensive heart and chronic kidney disease with heart failure and stage 1 through stage 4 chronic kidney disease, or unspecified chronic kidney disease: Secondary | ICD-10-CM | POA: Diagnosis not present

## 2020-07-29 DIAGNOSIS — M1712 Unilateral primary osteoarthritis, left knee: Secondary | ICD-10-CM | POA: Diagnosis not present

## 2020-07-29 DIAGNOSIS — G250 Essential tremor: Secondary | ICD-10-CM | POA: Diagnosis not present

## 2020-07-29 DIAGNOSIS — M797 Fibromyalgia: Secondary | ICD-10-CM | POA: Diagnosis not present

## 2020-07-29 DIAGNOSIS — N1831 Chronic kidney disease, stage 3a: Secondary | ICD-10-CM | POA: Diagnosis not present

## 2020-08-24 DIAGNOSIS — H401122 Primary open-angle glaucoma, left eye, moderate stage: Secondary | ICD-10-CM | POA: Diagnosis not present

## 2020-08-24 DIAGNOSIS — H401111 Primary open-angle glaucoma, right eye, mild stage: Secondary | ICD-10-CM | POA: Diagnosis not present

## 2020-08-30 ENCOUNTER — Inpatient Hospital Stay (HOSPITAL_COMMUNITY)
Admission: EM | Admit: 2020-08-30 | Discharge: 2020-09-28 | DRG: 951 | Disposition: E | Payer: PPO | Attending: Internal Medicine | Admitting: Internal Medicine

## 2020-08-30 ENCOUNTER — Other Ambulatory Visit: Payer: Self-pay

## 2020-08-30 ENCOUNTER — Emergency Department (HOSPITAL_COMMUNITY): Payer: PPO

## 2020-08-30 DIAGNOSIS — Z7901 Long term (current) use of anticoagulants: Secondary | ICD-10-CM

## 2020-08-30 DIAGNOSIS — M8580 Other specified disorders of bone density and structure, unspecified site: Secondary | ICD-10-CM | POA: Diagnosis present

## 2020-08-30 DIAGNOSIS — Z981 Arthrodesis status: Secondary | ICD-10-CM

## 2020-08-30 DIAGNOSIS — Z79899 Other long term (current) drug therapy: Secondary | ICD-10-CM

## 2020-08-30 DIAGNOSIS — Z90710 Acquired absence of both cervix and uterus: Secondary | ICD-10-CM | POA: Diagnosis not present

## 2020-08-30 DIAGNOSIS — R532 Functional quadriplegia: Secondary | ICD-10-CM | POA: Diagnosis not present

## 2020-08-30 DIAGNOSIS — I4510 Unspecified right bundle-branch block: Secondary | ICD-10-CM | POA: Diagnosis not present

## 2020-08-30 DIAGNOSIS — Z96651 Presence of right artificial knee joint: Secondary | ICD-10-CM | POA: Diagnosis present

## 2020-08-30 DIAGNOSIS — M069 Rheumatoid arthritis, unspecified: Secondary | ICD-10-CM | POA: Diagnosis present

## 2020-08-30 DIAGNOSIS — M797 Fibromyalgia: Secondary | ICD-10-CM | POA: Diagnosis present

## 2020-08-30 DIAGNOSIS — R069 Unspecified abnormalities of breathing: Secondary | ICD-10-CM | POA: Diagnosis not present

## 2020-08-30 DIAGNOSIS — H9190 Unspecified hearing loss, unspecified ear: Secondary | ICD-10-CM | POA: Diagnosis not present

## 2020-08-30 DIAGNOSIS — U071 COVID-19: Secondary | ICD-10-CM | POA: Diagnosis present

## 2020-08-30 DIAGNOSIS — I480 Paroxysmal atrial fibrillation: Secondary | ICD-10-CM | POA: Diagnosis present

## 2020-08-30 DIAGNOSIS — Z66 Do not resuscitate: Secondary | ICD-10-CM | POA: Diagnosis not present

## 2020-08-30 DIAGNOSIS — J1282 Pneumonia due to coronavirus disease 2019: Secondary | ICD-10-CM | POA: Diagnosis present

## 2020-08-30 DIAGNOSIS — I5032 Chronic diastolic (congestive) heart failure: Secondary | ICD-10-CM | POA: Diagnosis not present

## 2020-08-30 DIAGNOSIS — F32A Depression, unspecified: Secondary | ICD-10-CM | POA: Diagnosis present

## 2020-08-30 DIAGNOSIS — N189 Chronic kidney disease, unspecified: Secondary | ICD-10-CM | POA: Diagnosis not present

## 2020-08-30 DIAGNOSIS — I130 Hypertensive heart and chronic kidney disease with heart failure and stage 1 through stage 4 chronic kidney disease, or unspecified chronic kidney disease: Secondary | ICD-10-CM | POA: Diagnosis present

## 2020-08-30 DIAGNOSIS — Z833 Family history of diabetes mellitus: Secondary | ICD-10-CM

## 2020-08-30 DIAGNOSIS — Z8262 Family history of osteoporosis: Secondary | ICD-10-CM

## 2020-08-30 DIAGNOSIS — R0602 Shortness of breath: Secondary | ICD-10-CM | POA: Diagnosis not present

## 2020-08-30 DIAGNOSIS — I10 Essential (primary) hypertension: Secondary | ICD-10-CM | POA: Diagnosis present

## 2020-08-30 DIAGNOSIS — Z9049 Acquired absence of other specified parts of digestive tract: Secondary | ICD-10-CM

## 2020-08-30 DIAGNOSIS — Z515 Encounter for palliative care: Secondary | ICD-10-CM | POA: Diagnosis not present

## 2020-08-30 DIAGNOSIS — R0989 Other specified symptoms and signs involving the circulatory and respiratory systems: Secondary | ICD-10-CM | POA: Diagnosis present

## 2020-08-30 DIAGNOSIS — J9811 Atelectasis: Secondary | ICD-10-CM | POA: Diagnosis not present

## 2020-08-30 DIAGNOSIS — Z888 Allergy status to other drugs, medicaments and biological substances status: Secondary | ICD-10-CM

## 2020-08-30 DIAGNOSIS — Z801 Family history of malignant neoplasm of trachea, bronchus and lung: Secondary | ICD-10-CM | POA: Diagnosis not present

## 2020-08-30 DIAGNOSIS — Z885 Allergy status to narcotic agent status: Secondary | ICD-10-CM

## 2020-08-30 DIAGNOSIS — R0902 Hypoxemia: Secondary | ICD-10-CM

## 2020-08-30 DIAGNOSIS — R82998 Other abnormal findings in urine: Secondary | ICD-10-CM | POA: Diagnosis present

## 2020-08-30 DIAGNOSIS — J9601 Acute respiratory failure with hypoxia: Secondary | ICD-10-CM | POA: Diagnosis not present

## 2020-08-30 DIAGNOSIS — R0689 Other abnormalities of breathing: Secondary | ICD-10-CM | POA: Diagnosis not present

## 2020-08-30 LAB — URINALYSIS, ROUTINE W REFLEX MICROSCOPIC
Bilirubin Urine: NEGATIVE
Glucose, UA: NEGATIVE mg/dL
Hgb urine dipstick: NEGATIVE
Ketones, ur: 5 mg/dL — AB
Leukocytes,Ua: NEGATIVE
Nitrite: POSITIVE — AB
Protein, ur: NEGATIVE mg/dL
Specific Gravity, Urine: 1.016 (ref 1.005–1.030)
pH: 5 (ref 5.0–8.0)

## 2020-08-30 LAB — CBC WITH DIFFERENTIAL/PLATELET
Abs Immature Granulocytes: 0.03 10*3/uL (ref 0.00–0.07)
Basophils Absolute: 0 10*3/uL (ref 0.0–0.1)
Basophils Relative: 0 %
Eosinophils Absolute: 0 10*3/uL (ref 0.0–0.5)
Eosinophils Relative: 0 %
HCT: 40.4 % (ref 36.0–46.0)
Hemoglobin: 12.4 g/dL (ref 12.0–15.0)
Immature Granulocytes: 0 %
Lymphocytes Relative: 17 %
Lymphs Abs: 1.6 10*3/uL (ref 0.7–4.0)
MCH: 31.6 pg (ref 26.0–34.0)
MCHC: 30.7 g/dL (ref 30.0–36.0)
MCV: 103.1 fL — ABNORMAL HIGH (ref 80.0–100.0)
Monocytes Absolute: 0.5 10*3/uL (ref 0.1–1.0)
Monocytes Relative: 6 %
Neutro Abs: 7 10*3/uL (ref 1.7–7.7)
Neutrophils Relative %: 77 %
Platelets: 200 10*3/uL (ref 150–400)
RBC: 3.92 MIL/uL (ref 3.87–5.11)
RDW: 14.9 % (ref 11.5–15.5)
WBC: 9.1 10*3/uL (ref 4.0–10.5)
nRBC: 0 % (ref 0.0–0.2)

## 2020-08-30 LAB — I-STAT ARTERIAL BLOOD GAS, ED
Acid-Base Excess: 6 mmol/L — ABNORMAL HIGH (ref 0.0–2.0)
Bicarbonate: 30.3 mmol/L — ABNORMAL HIGH (ref 20.0–28.0)
Calcium, Ion: 1.09 mmol/L — ABNORMAL LOW (ref 1.15–1.40)
HCT: 37 % (ref 36.0–46.0)
Hemoglobin: 12.6 g/dL (ref 12.0–15.0)
O2 Saturation: 92 %
Patient temperature: 98.8
Potassium: 3.1 mmol/L — ABNORMAL LOW (ref 3.5–5.1)
Sodium: 140 mmol/L (ref 135–145)
TCO2: 32 mmol/L (ref 22–32)
pCO2 arterial: 42.5 mmHg (ref 32.0–48.0)
pH, Arterial: 7.462 — ABNORMAL HIGH (ref 7.350–7.450)
pO2, Arterial: 62 mmHg — ABNORMAL LOW (ref 83.0–108.0)

## 2020-08-30 LAB — LACTIC ACID, PLASMA: Lactic Acid, Venous: 1.2 mmol/L (ref 0.5–1.9)

## 2020-08-30 LAB — RESPIRATORY PANEL BY RT PCR (FLU A&B, COVID)
Influenza A by PCR: NEGATIVE
Influenza B by PCR: NEGATIVE
SARS Coronavirus 2 by RT PCR: POSITIVE — AB

## 2020-08-30 LAB — BASIC METABOLIC PANEL
Anion gap: 14 (ref 5–15)
BUN: 21 mg/dL (ref 8–23)
CO2: 26 mmol/L (ref 22–32)
Calcium: 8.9 mg/dL (ref 8.9–10.3)
Chloride: 99 mmol/L (ref 98–111)
Creatinine, Ser: 0.89 mg/dL (ref 0.44–1.00)
GFR, Estimated: >60 mL/min (ref >60)
Glucose, Bld: 117 mg/dL — ABNORMAL HIGH (ref 70–99)
Potassium: 3.3 mmol/L — ABNORMAL LOW (ref 3.5–5.1)
Sodium: 139 mmol/L (ref 135–145)

## 2020-08-30 LAB — BRAIN NATRIURETIC PEPTIDE: B Natriuretic Peptide: 117.2 pg/mL — ABNORMAL HIGH (ref 0.0–100.0)

## 2020-08-30 MED ORDER — GLYCOPYRROLATE 0.2 MG/ML IJ SOLN
0.2000 mg | INTRAMUSCULAR | Status: DC | PRN
  Administered 2020-08-30: 0.2 mg via INTRAVENOUS
  Filled 2020-08-30: qty 1

## 2020-08-30 MED ORDER — HYDROMORPHONE HCL 1 MG/ML IJ SOLN
0.5000 mg | INTRAMUSCULAR | Status: DC | PRN
  Administered 2020-08-30 (×3): 0.5 mg via INTRAVENOUS
  Filled 2020-08-30 (×3): qty 0.5

## 2020-08-30 MED ORDER — HALOPERIDOL LACTATE 2 MG/ML PO CONC
0.5000 mg | ORAL | Status: DC | PRN
  Filled 2020-08-30: qty 0.3

## 2020-08-30 MED ORDER — ALBUTEROL SULFATE HFA 108 (90 BASE) MCG/ACT IN AERS
1.0000 | INHALATION_SPRAY | RESPIRATORY_TRACT | Status: DC | PRN
  Filled 2020-08-30: qty 6.7

## 2020-08-30 MED ORDER — POLYVINYL ALCOHOL 1.4 % OP SOLN
1.0000 [drp] | Freq: Four times a day (QID) | OPHTHALMIC | Status: DC | PRN
  Filled 2020-08-30: qty 15

## 2020-08-30 MED ORDER — LORAZEPAM 2 MG/ML PO CONC: 1.0000 mg | ORAL | Status: DC | PRN

## 2020-08-30 MED ORDER — GLYCOPYRROLATE 1 MG PO TABS
1.0000 mg | ORAL_TABLET | ORAL | Status: DC | PRN
  Filled 2020-08-30: qty 1

## 2020-08-30 MED ORDER — BIOTENE DRY MOUTH MT LIQD: 15.0000 mL | OROMUCOSAL | Status: DC | PRN

## 2020-08-30 MED ORDER — GLYCOPYRROLATE 0.2 MG/ML IJ SOLN: 0.2000 mg | INTRAMUSCULAR | Status: DC | PRN

## 2020-08-30 MED ORDER — ONDANSETRON 4 MG PO TBDP: 4.0000 mg | ORAL_TABLET | Freq: Four times a day (QID) | ORAL | Status: DC | PRN

## 2020-08-30 MED ORDER — ONDANSETRON HCL 4 MG/2ML IJ SOLN: 4.0000 mg | Freq: Four times a day (QID) | INTRAMUSCULAR | Status: DC | PRN

## 2020-08-30 MED ORDER — LORAZEPAM 2 MG/ML IJ SOLN
1.0000 mg | INTRAMUSCULAR | Status: DC | PRN
  Administered 2020-08-30: 1 mg via INTRAVENOUS
  Filled 2020-08-30: qty 1

## 2020-08-30 MED ORDER — DIPHENHYDRAMINE HCL 50 MG/ML IJ SOLN: 12.5000 mg | INTRAMUSCULAR | Status: DC | PRN

## 2020-08-30 MED ORDER — SODIUM CHLORIDE 0.9 % IV SOLN
500.0000 mg | Freq: Once | INTRAVENOUS | Status: AC
  Administered 2020-08-30: 500 mg via INTRAVENOUS
  Filled 2020-08-30: qty 500

## 2020-08-30 MED ORDER — ACETAMINOPHEN 325 MG PO TABS: 650.0000 mg | ORAL_TABLET | Freq: Four times a day (QID) | ORAL | Status: DC | PRN

## 2020-08-30 MED ORDER — LORAZEPAM 1 MG PO TABS: 1.0000 mg | ORAL_TABLET | ORAL | Status: DC | PRN

## 2020-08-30 MED ORDER — SODIUM CHLORIDE 0.9 % IV SOLN
1.0000 g | Freq: Once | INTRAVENOUS | Status: AC
  Administered 2020-08-30: 1 g via INTRAVENOUS
  Filled 2020-08-30: qty 10

## 2020-08-30 MED ORDER — SODIUM CHLORIDE 0.9 % IV SOLN
0.5000 mg/h | INTRAVENOUS | Status: DC
  Filled 2020-08-30: qty 5

## 2020-08-30 MED ORDER — HALOPERIDOL LACTATE 5 MG/ML IJ SOLN: 0.5000 mg | INTRAMUSCULAR | Status: DC | PRN

## 2020-08-30 MED ORDER — PHENOL 1.4 % MT LIQD
1.0000 | OROMUCOSAL | Status: DC | PRN
  Filled 2020-08-30: qty 177

## 2020-08-30 MED ORDER — HYDROMORPHONE HCL 1 MG/ML IJ SOLN
1.0000 mg | Freq: Once | INTRAMUSCULAR | Status: AC
  Administered 2020-08-30: 1 mg via INTRAVENOUS
  Filled 2020-08-30: qty 1

## 2020-08-30 MED ORDER — HALOPERIDOL 1 MG PO TABS
0.5000 mg | ORAL_TABLET | ORAL | Status: DC | PRN
  Filled 2020-08-30: qty 0.5

## 2020-08-30 MED ORDER — ACETAMINOPHEN 650 MG RE SUPP: 650.0000 mg | Freq: Four times a day (QID) | RECTAL | Status: DC | PRN

## 2020-08-30 MED ORDER — ALBUTEROL SULFATE (2.5 MG/3ML) 0.083% IN NEBU: 2.5000 mg | INHALATION_SOLUTION | RESPIRATORY_TRACT | Status: DC | PRN

## 2020-08-31 LAB — BLOOD CULTURE ID PANEL (REFLEXED) - BCID2

## 2020-09-01 LAB — CULTURE, BLOOD (ROUTINE X 2)

## 2020-09-01 LAB — URINE CULTURE: Culture: >=100000 — AB

## 2020-09-04 LAB — CULTURE, BLOOD (ROUTINE X 2): Culture: NO GROWTH

## 2020-09-28 NOTE — Progress Notes (Signed)
   September 09, 2020 1725  Clinical Encounter Type  Visited With Patient and family together  Visit Type Initial  Referral From Nurse  Consult/Referral To Chaplain   Chaplain followed up on page from earlier today. Pt's loved one, [name], is at bedside and works at [employer]. Pt's son cannot visit until his COVID results come back. Chaplain provided emotional and grief support and prayed with [name].    This note was prepared by Chaplain Resident, Dante Gang, MDiv. Chaplain remains available as needed through the on-call pager: [phone].

## 2020-09-28 NOTE — ED Notes (Signed)
Patient suctioned by RT. Currently 97% on 3L [place].

## 2020-09-28 NOTE — Progress Notes (Signed)
Patient arrived on CPAP. EMS stated that she was 78% on RA. When she arrived, BBS rhonchi. Instructed her to cough, able to cough up large amounts yellow secretions. SAT 95% on 3L

## 2020-09-28 NOTE — Progress Notes (Signed)
RT NTS x1. Patient would not let me go down again. Large amounts yellow secretions obtained, but could have benefited from once more. Patient declined and stated she wanted to be left alone. MD aware.

## 2020-09-28 NOTE — ED Triage Notes (Signed)
Pt brought to ED via EMS from home. Pt 77% on RA with EMS; increased to 80% on NRB, 94% on CPAP. Per EMS, bilateral coarse rales, A&Ox4, malodorous urine. Patient alert on arrival to ED. Rt at bedside on arrival.  EMS: 96 HR A fib with RBBB 140 CBG 136/82 BP 22 RR

## 2020-09-28 NOTE — H&P (Signed)
History and Physical    Dominique Mckee:[id number] DOB: [birth year] DOA: 06-Sep-2020  PCP: Wenda Low, MD Consultants:  [facility] - orthopedics Patient coming from:  Home - lives with son; NOK: Jeanette Caprice, [phone]  Chief Complaint: SOB with hypoxia  HPI: Dominique Mckee is a 86 y.o. female with medical history significant of RA; HTN; fibromyalgia; depression; afib on Eliquis; and dCHF presenting with SOB with hypoxia. Her son reports that today was the third day - she was getting weak and he has had trouble getting her up and down.  She started coughing with mucus in her throat.  No known COVID contacts.    ED Course: SOB, 77% on RA, started on NRB.  RT suctioned and improved to mid-90s on 3L [place] O2.  Marked rhonchi and had endotracheal suctioning with improvement (patient declined further).  COVID +, unvaccinated.  BUL infiltrates on CXR.  Giving Rocephin, Azithromycin.  Review of Systems: unable to perform  Ambulatory Status:  Non-ambulatory  COVID Vaccine Status:  None  Past Medical History:  Diagnosis Date  . Arthritis   . Benign essential tremor   . Cervical spondylosis   . Cervicogenic headache   . CHF (congestive heart failure) ([place])   . Chronic renal insufficiency   . Degenerative disc disease   . Depression   . Fibromyalgia   . Gait disorder   . GERD (gastroesophageal reflux disease)   . Heart murmur   . HOH (hard of hearing)   . Hypertension   . Lumbar spondylosis   . Migraine headache   . Mild obesity   . Osteopenia   . Rheumatoid arthritis [facility])     Past Surgical History:  Procedure Laterality Date  . ABDOMINAL HYSTERECTOMY    . APPENDECTOMY    . CHOLECYSTECTOMY    . COLONOSCOPY    . ELBOW SURGERY Left   . HAMMER TOE SURGERY Left   . LUMBAR FUSION  02/2017  . SHOULDER CLOSED REDUCTION Right 07/08/2015   Procedure: RIGHT SHOULDER CLOSED REDUCTION;  Surgeon: Meredith Pel, MD;  Location: [facility];  Service: Orthopedics;  Laterality: Right;  .  SHOULDER CLOSED REDUCTION Left 08/23/2017   Procedure: CLOSED REDUCTION LEFT SHOULDER;  Surgeon: Meredith Pel, MD;  Location: [facility];  Service: Orthopedics;  Laterality: Left;  . TONSILLECTOMY    . TOTAL KNEE ARTHROPLASTY Right    right    Social History   Socioeconomic History  . Marital status: Widowed    Spouse name: Not on file  . Number of children: 2  . Years of education: 102  . Highest education level: Not on file  Occupational History  . Occupation: Retired    Fish farm manager: RETIRED  Tobacco Use  . Smoking status: Never Smoker  . Smokeless tobacco: Never Used  Vaping Use  . Vaping Use: Never used  Substance and Sexual Activity  . Alcohol use: No  . Drug use: No  . Sexual activity: Not Currently  Other Topics Concern  . Not on file  Social History Narrative   Patient is right handed.   Patient drinks very little caffeine.   Social Determinants of Health   Financial Resource Strain:   . Difficulty of Paying Living Expenses: Not on file  Food Insecurity:   . Worried About Charity fundraiser in the Last Year: Not on file  . Ran Out of Food in the Last Year: Not on file  Transportation Needs:   . Lack of Transportation (Medical):  Not on file  . Lack of Transportation (Non-Medical): Not on file  Physical Activity:   . Days of Exercise per Week: Not on file  . Minutes of Exercise per Session: Not on file  Stress:   . Feeling of Stress : Not on file  Social Connections:   . Frequency of Communication with Friends and Family: Not on file  . Frequency of Social Gatherings with Friends and Family: Not on file  . Attends Religious Services: Not on file  . Active Member of Clubs or Organizations: Not on file  . Attends Archivist Meetings: Not on file  . Marital Status: Not on file  Intimate Partner Violence:   . Fear of Current or Ex-Partner: Not on file  . Emotionally Abused: Not on file  . Physically Abused: Not on file  . Sexually Abused: Not on  file    Allergies  Allergen Reactions  . Feldene [Piroxicam]     Kidney issues  . Clinoril [Sulindac] Rash  . Morphine And Related Nausea And Vomiting    Family History  Problem Relation Age of Onset  . Osteoporosis Mother   . Intracerebral hemorrhage Mother 52  . AAA (abdominal aortic aneurysm) Father 42  . Lung cancer Brother   . Diabetes Brother   . Lung cancer Brother   . Diabetes Sister   . Lung cancer Sister   . Bone cancer Sister   . Kidney failure Son        left kidney transplant  . Diabetes type I Daughter   . Anesthesia problems Neg Hx   . Hypertension Neg Hx     Prior to Admission medications   Medication Sig Start Date End Date Taking? Authorizing Provider  acetaminophen (TYLENOL) 500 MG tablet Take 500 mg by mouth every 6 (six) hours as needed for mild pain.    [provider]  apixaban (ELIQUIS) 5 MG TABS tablet Take 1 tablet (5 mg total) by mouth 2 (two) times daily. 08/12/19   Domenic Polite, MD  Calcium Carb-Cholecalciferol (CALCIUM 600+D3) 600-200 MG-UNIT TABS Take 1 tablet by mouth daily. 11/15/18   Hennie Duos, MD  calcium carbonate (TUMS - DOSED IN MG ELEMENTAL CALCIUM) 500 MG chewable tablet Chew 1 tablet by mouth 2 (two) times daily as needed for indigestion.     [provider]  diclofenac sodium (VOLTAREN) 1 % GEL Apply 4 g topically 2 (two) times daily. To each shoulder 11/15/18   Hennie Duos, MD  ferrous sulfate 325 (65 FE) MG tablet Take 1 tablet (325 mg total) by mouth every evening. 11/15/18   Hennie Duos, MD  folic acid (FOLVITE) 1 MG tablet Take 1 tablet (1 mg total) by mouth daily. 11/15/18   Hennie Duos, MD  furosemide (LASIX) 20 MG tablet Take 20 mg by mouth daily. 12/25/19   [provider]  gabapentin (NEURONTIN) 300 MG capsule Take 3 capsules (900 mg total) by mouth at bedtime. 11/15/18   Hennie Duos, MD  methotrexate (RHEUMATREX) 2.5 MG tablet Take 10 tablets (25 mg total) by mouth once  a week. Caution:Chemotherapy. Protect from light. THURSDAYS 11/15/18   Hennie Duos, MD  pantoprazole (PROTONIX) 40 MG tablet Take 1 tablet (40 mg total) by mouth daily. 11/15/18   Hennie Duos, MD  senna (SENOKOT) 8.6 MG TABS tablet Take 2 tablets by mouth at bedtime as needed for mild constipation.    [provider]    Physical Exam: Vitals:  09/14/20 1245 09-14-20 1300 Sep 14, 2020 1315 09/14/20 1327  BP: (!) 145/99 (!) 133/97 136/79   Pulse: (!) 38 (!) 237 (!) 102   Resp: (!) 33 (!) 30 (!) 29   Temp:    98.6 F (37 C)  TempSrc:    Oral  SpO2: 97% (!) 89% 98%      . General:  Marked respiratory distress with audible gurgling from the doorway, O2 sats in 70s-80s on [place] O2, changed to NRB with improvement in sats after deep suctioning - but patient wants to be left alone and not suctioned . Eyes:   normal lids, iris . ENT:  grossly normal lips & tongue . Neck:  no LAD, masses or thyromegaly . Cardiovascular:  RRR, very distant heart sounds due to gurgling. No LE edema.  Marland Kitchen Respiratory:    Diffuse rhonchi with audible gurgling from the hallway and increased respiratory effort. . Abdomen:  soft, NT, ND . Skin:  no rash or induration seen on limited exam, feet are cool . Musculoskeletal:  no bony abnormality . Lower extremity:  No LE edema.  Limited foot exam with no ulcerations.  1+ distal pulses. Marland Kitchen Psychiatric:  Obtunded while markedly hypoxic but conversant again in short phrases with NRB in place, reports fatigue and wanting to be left alone . Neurologic:  Unable to perform    Radiological Exams on Admission: Independently reviewed - see discussion in A/P where applicable  DG Chest Portable 1 View  Result Date: 09-14-2020 CLINICAL DATA:  Shortness of breath.  Hypoxia. EXAM: PORTABLE CHEST 1 VIEW COMPARISON:  02/21/2020.  08/11/2019 10/24/2017.  CT 10/21/2018. FINDINGS: Mediastinum and hilar structures are stable. Heart size stable. Bilateral upper lobe  infiltrates. Chronic bilateral interstitial changes noted. Low lung volumes with bibasilar atelectasis. No pleural effusion or pneumothorax. Degenerative changes thoracic spine. Prior lumbar spine fusion. IMPRESSION: Bilateral upper lobe infiltrates. Chronic bilateral interstitial changes noted. Low lung volumes with bibasilar atelectasis. Electronically Signed   By: Marcello Moores  Register   On: September 14, 2020 07:54    EKG: Independently reviewed.  NSR with rate 94; RBBB with no evidence of acute ischemia   Labs on Admission: I have personally reviewed the available labs and imaging studies at the time of the admission.  Pertinent labs:   ABG: 7.462/42.5/62/98% K+ 3.3 Glucose 117 BNP 117.2 Lactate 1.2 WBC 9.1 COVID POSITIVE Flu negative UA: 5 ketones, +nitrite, few bacteria   Assessment/Plan Principal Problem:   Acute hypoxemic respiratory failure due to COVID-19 [facility]) Active Problems:   HTN (hypertension)   Rheumatoid arthritis (HCC)   Chronic diastolic CHF (congestive heart failure) (HCC)   Admission for end of life care   DNR (do not resuscitate)   Paroxysmal atrial fibrillation (HCC)   Functional quadriplegia (HCC)   Acute respiratory failure with hypoxia due to COVID-19 PNA -Patient with presenting with SOB and cough; EMS found sats in the 70s and started CPAP but improved in the ER and weaned to [facility] -Secretions reaccumulated and sats were in the 70-80s on [place] O2 at the time of my evaluation with significant respiratory distress -She again improved with deep suctioning and NRB O2 but declines further deep suctioning -She does not have a usual home O2 requirement and is currently requiring NRB [place] O2 -COVID POSITIVE -The patient has comorbidities which may increase the risk for ARDS/MODS including: age, HTN, immunosuppression, CHF -Exam is concerning for development of ARDS/MODS due to respiratory distress -CXR with multifocal opacities which may be c/w COVID vs. Multifocal  PNA -Based on severity of condition with underlying poor functional status, I have recommended comfort measures only for the patient and her son is in agreement with this plan. -Patient was seen wearing full PPE including: gown, gloves, head cover, N95, and face shield; donning and doffing was in compliance with current standards.  End of life care -Patient presenting with COVID PNA, respiratory distress -After discussion with the son by telephone, family has decided to proceed with comfort care only -She is admitted to [facility] for comfort care and palliative care consult -Comfort care order set utilized -Pain control with morphine as needed, would transition to a drip if needed  Functional quadriplegia -This patient has a baseline chronic medical condition that requires total care by caregivers (but without underlying spinal cord injury) -The patient is nonambulatory -Her functional quadriplegia places her at higher risks for complications from acute illness and requires greater resources for her care -Her son and various caregivers are at significant risk of COVID exposure and all have been encouraged to be tested; I personally made an appointment for testing for her son at [facility] today at 23 (he is reportedly a pancreas-kidney transplant recipient and at increased risk of complications, but works as an Clinical biochemist and has been anti-vax)  DNR -Patient has DNR in ACP documents from 2019 -Home medications for her other chronic medical problems have been discontinued due to transition to comfort care; this includes Eliquis, Lasix, Neurontin, and Methotrexate.      DVT prophylaxis: None - comfort measures Code Status: DNR - confirmed with family Family Communication: I spoke with her son by telephone at the time of admission Disposition Plan: Anticipate in-hospital death Consults called: Palliative care Admission status: Admit - It is my clinical opinion that admission to INPATIENT is reasonable  and necessary because of the expectation that this patient will require hospital care that crosses at least 2 midnights to treat this condition based on the medical complexity of the problems presented.  Given the aforementioned information, the predictability of an adverse outcome is felt to be significant.  Karmen Bongo MD [facility]   How to contact the [facility] Attending or Consulting provider [place] or covering provider during after hours [place], for this patient?  1. Check the care team in [facility] and look for a) attending/consulting [facility] provider listed and b) the [facility] team listed 2. Log into [email or web address] and use [place]'s universal password to access. If you do not have the password, please contact the hospital operator. 3. Locate the [facility] provider you are looking for under [facility] and page to a number that you can be directly reached. 4. If you still have difficulty reaching the provider, please page the [facility] (Director on Call) for the Hospitalists listed on amion for assistance.   September 19, 2020, 1:53 PM

## 2020-09-28 NOTE — Progress Notes (Signed)
RT called to see patient STAT. SAT were 98% on 3L. Placed on NRB and NTS for moderate amount yellow. MD at bedside, and to call family

## 2020-09-28 NOTE — Death Summary Note (Signed)
Death Summary  Dominique Mckee MBW:[id number] DOB: 10/11/1930 DOA: Sep 08, 2020  PCP: Wenda Low, MD PCP/Office notified: Yes, via SecureChat message  Admit date: 2020/09/08 Date of Death: September 11, 2020  Final Diagnoses:  Principal Problem:   Acute hypoxemic respiratory failure due to COVID-19 [facility]) Active Problems:   HTN (hypertension)   Rheumatoid arthritis ([place])   Chronic diastolic CHF (congestive heart failure) ([place])   Admission for end of life care   DNR (do not resuscitate)   Paroxysmal atrial fibrillation ([place])   Functional quadriplegia ([place])   History of present illness:  Dominique Mckee is a 86 y.o. female with medical history significant of RA; HTN; fibromyalgia; depression; afib on Eliquis; and dCHF presenting with SOB with hypoxia. Her son reports that today was the third day - she was getting weak and he has had trouble getting her up and down.  She started coughing with mucus in her throat.  No known COVID contacts.  Hospital Course:   Acute respiratory failure with hypoxia due to COVID-19 PNA -Patient with presenting with SOB and cough; EMS found sats in the 70s and started CPAP but improved in the ER and weaned to [facility] -Secretions reaccumulated and sats were in the 70-80s on [place] O2 at the time of my evaluation with significant respiratory distress -She again improved with deep suctioning and NRB O2 but declined further deep suctioning -She does not have a usual home O2 requirement and was requiring NRB  O2 at the time of admission -COVID POSITIVE -CXR with multifocal opacities c/w COVID vs. Multifocal PNA -Based on severity of condition with underlying poor functional status, I  recommended comfort measures only for the patient and her son was in agreement with this plan. -She was admitted to [facility] for comfort care and palliative care consult -Comfort care order set utilized -She expired several hours after admission with a family member at her bedside  Functional  quadriplegia -This patient had a baseline chronic medical condition that required total care by caregivers (but without underlying spinal cord injury) -The patient was nonambulatory -Her functional quadriplegia placed her at higher risks for complications from acute illness and required greater resources for her care -Her son and various caregivers are at significant risk of COVID exposure and all have been encouraged to be tested; I personally made an appointment for testing for her son (he is reportedly a pancreas-kidney transplant recipient and at increased risk of complications, but works as an Clinical biochemist and has been anti-vax)  DNR -Patient had DNR in [place] documents from 2019 -Home medications for her other chronic medical problems were discontinued due to transition to comfort care   Time: 30 minutes  Signed:  Karmen Bongo  [facility] Sep 11, 2020, 8:26 AM

## 2020-09-28 NOTE — Progress Notes (Signed)
   09-04-20 1935  Clinical Encounter Type  Visited With Patient and family together  Visit Type Follow-up;Patient actively dying  Consult/Referral To [place] followed up with Pt's granddaughter, [name], at bedside. Chaplain provided grief support and reminisced with family. Chaplain prayed with Pt's family, including other granddaughter Bernadene Bell, on the phone. Chaplain stayed with Pt's granddaughter until after Pt passed.    This note was prepared by Chaplain Resident, Dante Gang, MDiv. Chaplain remains available as needed through the on-call pager: [phone].

## 2020-09-28 NOTE — ED Provider Notes (Signed)
[facility] EMERGENCY DEPARTMENT Provider Note   CSN: [id number] Arrival date & time: 09-14-2020  0719     History Chief Complaint  Patient presents with  . Respiratory Distress    Dominique Mckee is a 86 y.o. female with PMH significant for diastolic CHF, HTN, RA on methotrexate, PE, and atrial fibrillation on Eliquis who presents to the ED via EMS from home with respiratory distress.  History was obtained by EMS reports that she was 77% on RA with EMS.  Rales were auscultated on their exam and there was reported malodorous urine.  On my examination, respiratory therapy was waiting upon patient arrival and were able to successfully provide suctioning.  She is now saturating in the 90s on 3 L supplemental O2 via [place].  She denies any at-home oxygen requirements.  She states that she feels fine and has no complaints.  She also reports that last evening, prior to onset of symptoms, she was feeling perfectly okay.  She reports that she has been taking her medication, as directed.  HPI     Past Medical History:  Diagnosis Date  . Arthritis   . Benign essential tremor   . Cervical spondylosis   . Cervicogenic headache   . CHF (congestive heart failure) ([place])   . Chronic renal insufficiency   . Degenerative disc disease   . Depression   . Fibromyalgia   . Gait disorder   . GERD (gastroesophageal reflux disease)   . Heart murmur   . HOH (hard of hearing)   . Hypertension   . Lumbar spondylosis   . Migraine headache   . Mild obesity   . Osteopenia   . Rheumatoid arthritis [facility])     Patient Active Problem List   Diagnosis Date Noted  . Syncope and collapse 08/11/2019  . History of pulmonary embolus (PE) 08/11/2019  . Acute pulmonary embolism ([place]) 11/15/2018  . Acute deep vein thrombosis (DVT) of femoral vein of right lower extremity ([place]) 10/29/2018  . Acute deep vein thrombosis (DVT) of tibial vein of both lower extremities ([place]) 10/29/2018  . Paroxysmal  atrial fibrillation ([place]) 10/29/2018  . Acute hypokalemia 10/29/2018  . Hypomagnesemia 10/29/2018  . Polyneuropathy 10/29/2018  . Weakness generalized   . Palliative care by specialist   . DNR (do not resuscitate)   . Neck pain   . Hypoxia 10/21/2018  . Nausea and vomiting 10/21/2018  . Chronic diastolic CHF (congestive heart failure) ([place]) 10/21/2018  . Status post lumbar spinal fusion 04/13/2017  . Degenerative lumbar spinal stenosis 03/15/2017  . CKD (chronic kidney disease) 01/11/2017  . Anemia 01/10/2017  . Chronic pain 01/10/2017  . Heart murmur 01/10/2017  . Acute diastolic congestive heart failure ([place]) 07/08/2015  . Acute respiratory failure with hypoxia ([place]) 07/08/2015  . Shoulder dislocation, recurrent 07/06/2015  . HTN (hypertension) 07/06/2015  . Rheumatoid arthritis ([place]) 07/06/2015  . GERD (gastroesophageal reflux disease) 07/06/2015  . Shoulder dislocation   . Concussion with no loss of consciousness 12/30/2013  . Cervical spondylosis without myelopathy 01/01/2013  . Migraine without aura 01/01/2013  . Abnormality of gait 01/01/2013  . Essential and other specified forms of tremor 01/01/2013    Past Surgical History:  Procedure Laterality Date  . ABDOMINAL HYSTERECTOMY    . APPENDECTOMY    . CHOLECYSTECTOMY    . COLONOSCOPY    . ELBOW SURGERY Left   . HAMMER TOE SURGERY Left   . LUMBAR FUSION  02/2017  . SHOULDER CLOSED REDUCTION  Right 07/08/2015   Procedure: RIGHT SHOULDER CLOSED REDUCTION;  Surgeon: Meredith Pel, MD;  Location: [facility];  Service: Orthopedics;  Laterality: Right;  . SHOULDER CLOSED REDUCTION Left 08/23/2017   Procedure: CLOSED REDUCTION LEFT SHOULDER;  Surgeon: Meredith Pel, MD;  Location: [facility];  Service: Orthopedics;  Laterality: Left;  . TONSILLECTOMY    . TOTAL KNEE ARTHROPLASTY Right    right     OB History    Gravida      Para      Term      Preterm      AB      Living  1     SAB      TAB      Ectopic       Multiple      Live Births              Family History  Problem Relation Age of Onset  . Osteoporosis Mother   . Intracerebral hemorrhage Mother 63  . AAA (abdominal aortic aneurysm) Father 17  . Lung cancer Brother   . Diabetes Brother   . Lung cancer Brother   . Diabetes Sister   . Lung cancer Sister   . Bone cancer Sister   . Kidney failure Son        left kidney transplant  . Diabetes type I Daughter   . Anesthesia problems Neg Hx   . Hypertension Neg Hx     Social History   Tobacco Use  . Smoking status: Never Smoker  . Smokeless tobacco: Never Used  Vaping Use  . Vaping Use: Never used  Substance Use Topics  . Alcohol use: No  . Drug use: No    Home Medications Prior to Admission medications   Medication Sig Start Date End Date Taking? Authorizing Provider  acetaminophen (TYLENOL) 500 MG tablet Take 500 mg by mouth every 6 (six) hours as needed for mild pain.    [provider]  apixaban (ELIQUIS) 5 MG TABS tablet Take 1 tablet (5 mg total) by mouth 2 (two) times daily. 08/12/19   Domenic Polite, MD  Calcium Carb-Cholecalciferol (CALCIUM 600+D3) 600-200 MG-UNIT TABS Take 1 tablet by mouth daily. 11/15/18   Hennie Duos, MD  calcium carbonate (TUMS - DOSED IN MG ELEMENTAL CALCIUM) 500 MG chewable tablet Chew 1 tablet by mouth 2 (two) times daily as needed for indigestion.     [provider]  diclofenac sodium (VOLTAREN) 1 % GEL Apply 4 g topically 2 (two) times daily. To each shoulder 11/15/18   Hennie Duos, MD  ferrous sulfate 325 (65 FE) MG tablet Take 1 tablet (325 mg total) by mouth every evening. 11/15/18   Hennie Duos, MD  folic acid (FOLVITE) 1 MG tablet Take 1 tablet (1 mg total) by mouth daily. 11/15/18   Hennie Duos, MD  furosemide (LASIX) 20 MG tablet Take 20 mg by mouth daily. 12/25/19   [provider]  gabapentin (NEURONTIN) 300 MG capsule Take 3 capsules (900 mg total) by mouth at bedtime.  11/15/18   Hennie Duos, MD  methotrexate (RHEUMATREX) 2.5 MG tablet Take 10 tablets (25 mg total) by mouth once a week. Caution:Chemotherapy. Protect from light. THURSDAYS 11/15/18   Hennie Duos, MD  pantoprazole (PROTONIX) 40 MG tablet Take 1 tablet (40 mg total) by mouth daily. 11/15/18   Hennie Duos, MD  senna (SENOKOT) 8.6 MG TABS tablet Take 2 tablets by mouth  at bedtime as needed for mild constipation.    [provider]    Allergies    Feldene [piroxicam], Clinoril [sulindac], and Morphine and related  Review of Systems   Review of Systems  All other systems reviewed and are negative.   Physical Exam Updated Vital Signs BP (!) 121/99   Pulse 100   Temp 98.8 F (37.1 C) (Oral)   Resp (!) 26   SpO2 93%   Physical Exam Vitals and nursing note reviewed. Exam conducted with a chaperone present.  Constitutional:      Appearance: She is ill-appearing.  HENT:     Head: Normocephalic and atraumatic.  Eyes:     General: No scleral icterus.    Conjunctiva/sclera: Conjunctivae normal.  Cardiovascular:     Rate and Rhythm: Normal rate.     Pulses: Normal pulses.  Pulmonary:     Comments: No significant increased work of breathing.  No tachypnea.  Oxygenating in 90s on 3 L supplemental O2 via [place].  Rhonchorous breath sounds diffusely. Skin:    General: Skin is dry.     Capillary Refill: Capillary refill takes less than 2 seconds.  Neurological:     Mental Status: She is alert.     GCS: GCS eye subscore is 4. GCS verbal subscore is 5. GCS motor subscore is 6.  Psychiatric:        Mood and Affect: Mood normal.        Behavior: Behavior normal.        Thought Content: Thought content normal.     ED Results / Procedures / Treatments   Labs (all labs ordered are listed, but only abnormal results are displayed) Labs Reviewed  RESPIRATORY PANEL BY RT PCR (FLU A&B, COVID) - Abnormal; Notable for the following components:      Result Value   SARS  Coronavirus 2 by RT PCR POSITIVE (*)    All other components within normal limits  CBC WITH DIFFERENTIAL/PLATELET - Abnormal; Notable for the following components:   MCV 103.1 (*)    All other components within normal limits  BASIC METABOLIC PANEL - Abnormal; Notable for the following components:   Potassium 3.3 (*)    Glucose, Bld 117 (*)    All other components within normal limits  URINALYSIS, ROUTINE W REFLEX MICROSCOPIC - Abnormal; Notable for the following components:   APPearance HAZY (*)    Ketones, ur 5 (*)    Nitrite POSITIVE (*)    Bacteria, UA FEW (*)    All other components within normal limits  BRAIN NATRIURETIC PEPTIDE - Abnormal; Notable for the following components:   B Natriuretic Peptide 117.2 (*)    All other components within normal limits  I-STAT ARTERIAL BLOOD GAS, ED - Abnormal; Notable for the following components:   pH, Arterial 7.462 (*)    pO2, Arterial 62 (*)    Bicarbonate 30.3 (*)    Acid-Base Excess 6.0 (*)    Potassium 3.1 (*)    Calcium, Ion 1.09 (*)    All other components within normal limits  CULTURE, BLOOD (ROUTINE X 2)  CULTURE, BLOOD (ROUTINE X 2)  URINE CULTURE  LACTIC ACID, PLASMA    EKG None  Radiology DG Chest Portable 1 View  Result Date: 2020/09/27 CLINICAL DATA:  Shortness of breath.  Hypoxia. EXAM: PORTABLE CHEST 1 VIEW COMPARISON:  02/21/2020.  08/11/2019 10/24/2017.  CT 10/21/2018. FINDINGS: Mediastinum and hilar structures are stable. Heart size stable. Bilateral upper lobe infiltrates. Chronic bilateral  interstitial changes noted. Low lung volumes with bibasilar atelectasis. No pleural effusion or pneumothorax. Degenerative changes thoracic spine. Prior lumbar spine fusion. IMPRESSION: Bilateral upper lobe infiltrates. Chronic bilateral interstitial changes noted. Low lung volumes with bibasilar atelectasis. Electronically Signed   By: Marcello Moores  Register   On: 09-04-2020 07:54    Procedures .Critical Care Performed by:  Corena Herter, PA-C Authorized by: Corena Herter, PA-C   Critical care provider statement:    Critical care time (minutes):  45   Critical care was necessary to treat or prevent imminent or life-threatening deterioration of the following conditions:  Respiratory failure   Critical care was time spent personally by me on the following activities:  Discussions with consultants, evaluation of patient's response to treatment, examination of patient, ordering and performing treatments and interventions, ordering and review of laboratory studies, ordering and review of radiographic studies, pulse oximetry, re-evaluation of patient's condition, obtaining history from patient or surrogate, review of old charts and blood draw for specimens Comments:     Hypoxic.   (including critical care time)  Medications Ordered in ED Medications  cefTRIAXone (ROCEPHIN) 1 g in sodium chloride 0.9 % 100 mL IVPB (0 g Intravenous Stopped 09-04-20 1119)  azithromycin (ZITHROMAX) 500 mg in sodium chloride 0.9 % 250 mL IVPB (0 mg Intravenous Stopped 09/04/2020 1119)    ED Course  I have reviewed the triage vital signs and the nursing notes.  Pertinent labs & imaging results that were available during my care of the patient were reviewed by me and considered in my medical decision making (see chart for details).  Clinical Course as of Aug 30 1141  Tue Aug 30, 2020  1013 SARS Coronavirus 2 by RT PCR(!): POSITIVE [GG]  1140 Spoke with Dr. Lorin Mercy who will see and admit patient.   [GG]    Clinical Course User Index [GG] Corena Herter, PA-C   MDM Rules/Calculators/A&P                          Respiratory therapy was successfully able to suction a moderate amount of whitish mucus which led to improvement of patient's hypoxia.  She is now resting comfortably on 3 L supplemental O2 via [place].  Concern for aspiration pneumonia versus pulmonary edema from diastolic CHF.  Will obtain laboratory work-up and  imaging.  Labs CBC with differential: No anemia or acidosis concerning for infection. BMP: Mild hypokalemia 3.3. Lactic acid: 1.2. Blood cultures: Pending. I-STAT ABG: Hypoxemia to 62 PO2, arterial.  Mildly elevated bicarbonate.  Mildly alkalotic to 7.46. BNP: Mildly elevated 117.2. UA: Nitrite positive without leukocytes and rare bacteria.  Urine culture: Pending. Respiratory panel PCR: COVID-19 positive.  Plain films of chest are personally reviewed and demonstrate bilateral upper lobe infiltrates.   Will consult respiratory therapy as we suspect that patient would benefit from endotracheal/NP suctioning given the rhonchorous rattles on my exam.  Anselmo Pickler, RRT, was abl to suction large amounts of yellow secretions, but believes that she would have been able to extract more with patient compliance.  However, patient declined further treatments.  Spoke with Dr. Lorin Mercy who will see and admit patient.     Final Clinical Impression(s) / ED Diagnoses Final diagnoses:  Hypoxia    Rx / DC Orders ED Discharge Orders    None       Corena Herter, PA-C Sep 04, 2020 1142    Gareth Morgan, MD 09-04-2020 2244

## 2020-09-28 NOTE — Progress Notes (Signed)
Ike Bene phoned the unit to respond to page for Pt. spiritual care from Pt. daughter. This chaplain understands request for spiritual care will be shared with evening chaplain.

## 2020-09-28 NOTE — Progress Notes (Signed)
Central monitor showing that pts pulse ox no longer has a signal. Went into room and pt appeared to not be breathing, upon auscultation pt had no audible respirations or heart beat, no palpable pulse. Second RN, Reagan also confirmed pt had no pulse / respirations. Time of death 2001, 2020/09/20. MD B. Chotiner made aware.    Granddaughter, [name], at bedside at this time. Attempted twice to call pts next of kin / son, Merry Proud with no answer, left voicemail requesting a return call.

## 2020-09-28 DEATH — deceased
